# Patient Record
Sex: Male | Born: 1945 | Race: White | Hispanic: No | Marital: Married | Smoking: Never smoker
Health system: Southern US, Community
[De-identification: ages and names within clinical notes are randomized; demographics above are authoritative.]

## PROBLEM LIST (undated history)

## (undated) DIAGNOSIS — T8149XA Infection following a procedure, other surgical site, initial encounter: Secondary | ICD-10-CM

## (undated) DIAGNOSIS — I10 Essential (primary) hypertension: Secondary | ICD-10-CM

## (undated) DIAGNOSIS — A419 Sepsis, unspecified organism: Secondary | ICD-10-CM

## (undated) DIAGNOSIS — K56609 Unspecified intestinal obstruction, unspecified as to partial versus complete obstruction: Secondary | ICD-10-CM

## (undated) DIAGNOSIS — I712 Thoracic aortic aneurysm, without rupture: Secondary | ICD-10-CM

## (undated) DIAGNOSIS — H3530 Unspecified macular degeneration: Secondary | ICD-10-CM

## (undated) DIAGNOSIS — R251 Tremor, unspecified: Secondary | ICD-10-CM

## (undated) DIAGNOSIS — A310 Pulmonary mycobacterial infection: Secondary | ICD-10-CM

## (undated) DIAGNOSIS — E785 Hyperlipidemia, unspecified: Secondary | ICD-10-CM

## (undated) DIAGNOSIS — J189 Pneumonia, unspecified organism: Secondary | ICD-10-CM

## (undated) DIAGNOSIS — I2510 Atherosclerotic heart disease of native coronary artery without angina pectoris: Secondary | ICD-10-CM

## (undated) DIAGNOSIS — K219 Gastro-esophageal reflux disease without esophagitis: Secondary | ICD-10-CM

## (undated) DIAGNOSIS — G250 Essential tremor: Secondary | ICD-10-CM

## (undated) DIAGNOSIS — E8809 Other disorders of plasma-protein metabolism, not elsewhere classified: Secondary | ICD-10-CM

## (undated) DIAGNOSIS — D649 Anemia, unspecified: Secondary | ICD-10-CM

## (undated) DIAGNOSIS — C800 Disseminated malignant neoplasm, unspecified: Secondary | ICD-10-CM

## (undated) HISTORY — PX: TONSILLECTOMY: SUR1361

## (undated) HISTORY — DX: Essential tremor: G25.0

---

## 2012-06-22 DIAGNOSIS — J189 Pneumonia, unspecified organism: Secondary | ICD-10-CM

## 2012-06-22 HISTORY — DX: Pneumonia, unspecified organism: J18.9

## 2012-07-12 ENCOUNTER — Emergency Department (HOSPITAL_COMMUNITY): Payer: 59

## 2012-07-12 ENCOUNTER — Encounter (HOSPITAL_COMMUNITY): Payer: Self-pay | Admitting: Emergency Medicine

## 2012-07-12 ENCOUNTER — Inpatient Hospital Stay (HOSPITAL_COMMUNITY)
Admission: EM | Admit: 2012-07-12 | Discharge: 2012-07-15 | DRG: 195 | Disposition: A | Discharge status: Home / Self Care | Payer: 59 | Attending: Family Medicine | Admitting: Family Medicine

## 2012-07-12 DIAGNOSIS — G250 Essential tremor: Secondary | ICD-10-CM | POA: Diagnosis present

## 2012-07-12 DIAGNOSIS — Z23 Encounter for immunization: Secondary | ICD-10-CM

## 2012-07-12 DIAGNOSIS — R251 Tremor, unspecified: Secondary | ICD-10-CM | POA: Diagnosis present

## 2012-07-12 DIAGNOSIS — Z79899 Other long term (current) drug therapy: Secondary | ICD-10-CM

## 2012-07-12 DIAGNOSIS — H3530 Unspecified macular degeneration: Secondary | ICD-10-CM | POA: Diagnosis present

## 2012-07-12 DIAGNOSIS — Z8249 Family history of ischemic heart disease and other diseases of the circulatory system: Secondary | ICD-10-CM

## 2012-07-12 DIAGNOSIS — J189 Pneumonia, unspecified organism: Principal | ICD-10-CM | POA: Diagnosis present

## 2012-07-12 DIAGNOSIS — Z791 Long term (current) use of non-steroidal anti-inflammatories (NSAID): Secondary | ICD-10-CM

## 2012-07-12 HISTORY — DX: Unspecified macular degeneration: H35.30

## 2012-07-12 HISTORY — DX: Pneumonia, unspecified organism: J18.9

## 2012-07-12 HISTORY — DX: Tremor, unspecified: R25.1

## 2012-07-12 LAB — POCT I-STAT, CHEM 8
Chloride: 104 mEq/L (ref 96–112)
Glucose, Bld: 117 mg/dL — ABNORMAL HIGH (ref 70–99)
HCT: 37 % — ABNORMAL LOW (ref 39.0–52.0)
Hemoglobin: 12.6 g/dL — ABNORMAL LOW (ref 13.0–17.0)
Potassium: 3.9 mEq/L (ref 3.5–5.1)

## 2012-07-12 LAB — CBC WITH DIFFERENTIAL/PLATELET
Basophils Absolute: 0 10*3/uL (ref 0.0–0.1)
Basophils Relative: 0 % (ref 0–1)
Eosinophils Absolute: 0.3 10*3/uL (ref 0.0–0.7)
Hemoglobin: 12.4 g/dL — ABNORMAL LOW (ref 13.0–17.0)
MCH: 32 pg (ref 26.0–34.0)
MCHC: 34.8 g/dL (ref 30.0–36.0)
Neutro Abs: 7.3 10*3/uL (ref 1.7–7.7)
Neutrophils Relative %: 70 % (ref 43–77)
Platelets: 253 10*3/uL (ref 150–400)
RDW: 13 % (ref 11.5–15.5)

## 2012-07-12 LAB — POCT I-STAT TROPONIN I: Troponin i, poc: 0 ng/mL (ref 0.00–0.08)

## 2012-07-12 MED ORDER — VANCOMYCIN HCL IN DEXTROSE 1-5 GM/200ML-% IV SOLN
1000.0000 mg | Freq: Once | INTRAVENOUS | Status: AC
  Administered 2012-07-12: 1000 mg via INTRAVENOUS
  Filled 2012-07-12: qty 200

## 2012-07-12 MED ORDER — OCUVITE-LUTEIN PO CAPS
1.0000 | ORAL_CAPSULE | Freq: Every day | ORAL | Status: DC
  Administered 2012-07-13 – 2012-07-15 (×3): 1 via ORAL
  Filled 2012-07-12 (×3): qty 1

## 2012-07-12 MED ORDER — PNEUMOCOCCAL VAC POLYVALENT 25 MCG/0.5ML IJ INJ
0.5000 mL | INJECTION | INTRAMUSCULAR | Status: AC
  Administered 2012-07-13: 0.5 mL via INTRAMUSCULAR
  Filled 2012-07-12: qty 0.5

## 2012-07-12 MED ORDER — DEXTROSE 5 % IV SOLN
1.0000 g | Freq: Three times a day (TID) | INTRAVENOUS | Status: DC
  Administered 2012-07-12 – 2012-07-15 (×8): 1 g via INTRAVENOUS
  Filled 2012-07-12 (×10): qty 1

## 2012-07-12 MED ORDER — PIPERACILLIN-TAZOBACTAM 3.375 G IVPB 30 MIN
3.3750 g | Freq: Once | INTRAVENOUS | Status: AC
  Administered 2012-07-12: 3.375 g via INTRAVENOUS
  Filled 2012-07-12: qty 50

## 2012-07-12 MED ORDER — PRIMIDONE 250 MG PO TABS
250.0000 mg | ORAL_TABLET | Freq: Every day | ORAL | Status: DC
  Administered 2012-07-13 – 2012-07-15 (×3): 250 mg via ORAL
  Filled 2012-07-12 (×3): qty 1

## 2012-07-12 MED ORDER — LEVOFLOXACIN IN D5W 750 MG/150ML IV SOLN
750.0000 mg | INTRAVENOUS | Status: AC
  Administered 2012-07-12 – 2012-07-14 (×3): 750 mg via INTRAVENOUS
  Filled 2012-07-12 (×3): qty 150

## 2012-07-12 MED ORDER — IOHEXOL 350 MG/ML SOLN
100.0000 mL | Freq: Once | INTRAVENOUS | Status: AC | PRN
  Administered 2012-07-12: 100 mL via INTRAVENOUS

## 2012-07-12 MED ORDER — IBUPROFEN 800 MG PO TABS
800.0000 mg | ORAL_TABLET | Freq: Three times a day (TID) | ORAL | Status: DC
  Administered 2012-07-12 – 2012-07-13 (×5): 800 mg via ORAL
  Filled 2012-07-12 (×10): qty 1

## 2012-07-12 MED ORDER — VANCOMYCIN HCL IN DEXTROSE 1-5 GM/200ML-% IV SOLN
1000.0000 mg | Freq: Two times a day (BID) | INTRAVENOUS | Status: DC
  Administered 2012-07-13 – 2012-07-15 (×5): 1000 mg via INTRAVENOUS
  Filled 2012-07-12 (×6): qty 200

## 2012-07-12 MED ORDER — VITAMIN D3 25 MCG (1000 UNIT) PO TABS
1000.0000 [IU] | ORAL_TABLET | Freq: Every day | ORAL | Status: DC
  Administered 2012-07-13 – 2012-07-15 (×3): 1000 [IU] via ORAL
  Filled 2012-07-12 (×3): qty 1

## 2012-07-12 MED ORDER — PROPRANOLOL HCL ER 160 MG PO CP24
160.0000 mg | ORAL_CAPSULE | Freq: Every day | ORAL | Status: DC
  Administered 2012-07-13 – 2012-07-15 (×3): 160 mg via ORAL
  Filled 2012-07-12 (×3): qty 1

## 2012-07-12 NOTE — ED Notes (Signed)
Onset 4 days ago left side chest pain with radiating to left shoulder pain and dry cough.

## 2012-07-12 NOTE — Progress Notes (Signed)
ANTIBIOTIC CONSULT NOTE - INITIAL  Pharmacy Consult for Vancomycin, Cefepime, and Levaquin Indication: rule out pneumonia  No Known Allergies  Patient Measurements:   Height = 73 inches Weight = 77 kg  Vital Signs: Temp: 98 F (36.7 C) (03/21 1530) Temp src: Oral (03/21 1530) BP: 138/76 mmHg (03/21 1630) Pulse Rate: 65 (03/21 1630) Intake/Output from previous day:   Intake/Output from this shift:    Labs:  Recent Labs  07/12/12 1202 07/12/12 1231  WBC 10.4  --   HGB 12.4* 12.6*  PLT 253  --   CREATININE  --  1.00   CrCl ~ 75 ml/min    Microbiology: No results found for this or any previous visit (from the past 720 hour(s)).  Medical History: Past Medical History  Diagnosis Date  . Tremor     takes Primidone 250 once daily and inderall 160 daily for this-has had it since he was 21    Medications:  Prescriptions prior to admission  Medication Sig Dispense Refill  . cholecalciferol (VITAMIN D) 1000 UNITS tablet Take 1,000 Units by mouth daily.      Marland Kitchen ibuprofen (ADVIL,MOTRIN) 200 MG tablet Take 800 mg by mouth every 8 (eight) hours as needed for pain.      . multivitamin-lutein (OCUVITE-LUTEIN) CAPS Take 1 capsule by mouth daily.      . primidone (MYSOLINE) 250 MG tablet Take 250 mg by mouth daily.      . propranolol ER (INDERAL LA) 160 MG SR capsule Take 160 mg by mouth daily.       Assessment: 67 yo M presented to ED 07/12/2012 with flu-like symptoms 1 month ago but has continued to have persistent cough.  He obtained a CXR that suggested LLL PNA and completed a 10-day course of Avelox and improved.  Four days ago he developed pleuritic CP and the cough returned.  CT scan done suggestive of PNA and negative for PE.  Pt is now admitted for IV antibiotics for suspected HCAP.  Patient received Zosyn x 1 dose in the ED ~ 1600 and Vancomycin 1gm ~ 1639.  Goal of Therapy:  Vancomycin trough level 15-20 mcg/ml  Plan:  Vancomycin 1gm IV q12h - next dose due 3/22  at 0600 (x 8 days). Continue Cefepime 1gm IV q8h as ordered (x 8 days). Continue Levaquin 750mg  IV q24h as ordered (x 3 days). Follow renal function, culture results, and clinical progress. Vancomycin level as indicated.  [employer], Pharm.D., BCPS Clinical Pharmacist Pager [phone] 07/12/2012 5:37 PM

## 2012-07-12 NOTE — ED Notes (Signed)
Pt c/o left sided chest pain, radiates to left shoulder. Pain is worse with deep inspiration. Pt states he has also noted a dry cough. Pt denies n/v, sob, or any other concerning symptoms. Pt states pain is relieved with ibuprofen.

## 2012-07-12 NOTE — Progress Notes (Signed)
Wesley Dillon [id number] Admission Data: 07/12/2012 6:21 PM Attending Provider: Rhetta Mura, MD  WUJ:[id number] Roseanne Reno, MD Consults/ Treatment Team:    Wesley Dillon is a 67 y.o. male patient admitted from ED awake, alert  & orientated  X 3, Came into ED with left sided CP radiating to left shoulder, dry cough. , VSS - Blood pressure 127/74, pulse 63, temperature 98.9 F (37.2 C), temperature source Oral, resp. rate 20, height 6\' 1"  (1.854 m), weight 77 kg (169 lb 12.1 oz), SpO2 100.00%., no c/o shortness of breath, no c/o chest pain, no distress noted. No tele  IV site WDL:  antecubital right, condition patent and no redness with a transparent dsg that's clean dry and intact.  Allergies:  No Known Allergies   Past Medical History  Diagnosis Date  . Tremor     takes Primidone 250 once daily and inderall 160 daily for this-has had it since he was 21  . Pneumonia 06/2012  . Macular degeneration     History:  obtained from the patient.   Pt orientation to unit, room and routine. Information packet given to patient/family and safety video watched.  Admission INP armband ID verified with patient/family, and in place. SR up x 2, fall risk assessment complete with Patient and family verbalizing understanding of risks associated with falls. Pt verbalizes an understanding of how to use the call bell and to call for help before getting out of bed.  Skin, clean-dry- intact without evidence of bruising, or skin tears.   No evidence of skin break down noted on exam.     Will cont to monitor and assist as needed.  Cindra Eves, RN 07/12/2012 6:21 PM

## 2012-07-12 NOTE — ED Provider Notes (Signed)
History     CSN: [id number]  Arrival date & time 07/12/12  1143   First MD Initiated Contact with Patient 07/12/12 1208      Chief Complaint  Patient presents with  . Chest Pain  . Cough    (Consider location/radiation/quality/duration/timing/severity/associated sxs/prior treatment) HPI Comments: Wesley Dillon is a 67 y.o. Male who presents for evaluation of pleuritic chest pain present for 4 days.  The pain is worse with deep breathing. It does not change with body movements.  He was treated for pneumonia about 3 months ago and subsequently has had a normal chest x-ray. He has a mild persistent cough, nonproductive. He denies fever, chills, nausea, vomiting, weakness, or dizziness. He has not previously had chronic lung disease. There are no other known modifying factors.  Patient is a 67 y.o. male presenting with chest pain and cough. The history is provided by the patient and the spouse.  Chest Pain Associated symptoms: cough   Cough Associated symptoms: chest pain     Past Medical History  Diagnosis Date  . Tremor     History reviewed. No pertinent past surgical history.  No family history on file.  History  Substance Use Topics  . Smoking status: Never Smoker   . Smokeless tobacco: Not on file  . Alcohol Use: No      Review of Systems  Respiratory: Positive for cough.   Cardiovascular: Positive for chest pain.  All other systems reviewed and are negative.    Allergies  Review of patient's allergies indicates no known allergies.  Home Medications   Current Outpatient Rx  Name  Route  Sig  Dispense  Refill  . cholecalciferol (VITAMIN D) 1000 UNITS tablet   Oral   Take 1,000 Units by mouth daily.         Marland Kitchen ibuprofen (ADVIL,MOTRIN) 200 MG tablet   Oral   Take 800 mg by mouth every 8 (eight) hours as needed for pain.         . multivitamin-lutein (OCUVITE-LUTEIN) CAPS   Oral   Take 1 capsule by mouth daily.         . primidone (MYSOLINE) 250  MG tablet   Oral   Take 250 mg by mouth daily.         . propranolol ER (INDERAL LA) 160 MG SR capsule   Oral   Take 160 mg by mouth daily.           BP 111/61  Pulse 62  Temp(Src) 98 F (36.7 C) (Oral)  Resp 14  SpO2 98%  Physical Exam  Nursing note and vitals reviewed. Constitutional: He is oriented to person, place, and time. He appears well-developed and well-nourished.  HENT:  Head: Normocephalic and atraumatic.  Right Ear: External ear normal.  Left Ear: External ear normal.  Eyes: Conjunctivae and EOM are normal. Pupils are equal, round, and reactive to light.  Neck: Normal range of motion and phonation normal. Neck supple.  Cardiovascular: Normal rate, regular rhythm, normal heart sounds and intact distal pulses.   Pulmonary/Chest: Effort normal and breath sounds normal. He exhibits no bony tenderness.  Abdominal: Soft. Normal appearance. There is no tenderness.  Musculoskeletal: Normal range of motion.  Neurological: He is alert and oriented to person, place, and time. He has normal strength. No cranial nerve deficit or sensory deficit. He exhibits normal muscle tone. Coordination normal.  Skin: Skin is warm, dry and intact.  Psychiatric: He has a normal mood and affect. His behavior  is normal. Judgment and thought content normal.    ED Course  Procedures (including critical care time)   Reevaluation: 1600 hours- no further complaint. Repeat vital signs are normal  Emergency Department treatment: Zosyn and vancomycin, IV.  Case discussed with hospitalist, patient will be admitted   Labs Reviewed  CBC WITH DIFFERENTIAL - Abnormal; Notable for the following:    RBC 3.88 (*)    Hemoglobin 12.4 (*)    HCT 35.6 (*)    Monocytes Absolute 1.1 (*)    All other components within normal limits  POCT I-STAT, CHEM 8 - Abnormal; Notable for the following:    Glucose, Bld 117 (*)    Hemoglobin 12.6 (*)    HCT 37.0 (*)    All other components within normal  limits  POCT I-STAT TROPONIN I   Ct Angio Chest W/cm &/or Wo Cm  07/12/2012  *RADIOLOGY REPORT*  Clinical Data: Chest pain and cough.  CT ANGIOGRAPHY CHEST  Technique:  Multidetector CT imaging of the chest using the standard protocol during bolus administration of intravenous contrast. Multiplanar reconstructed images including MIPs were obtained and reviewed to evaluate the vascular anatomy.  Contrast: OMNIPAQUE IOHEXOL 350 MG/ML SOLN chest x-ray 07/01/2012.  Comparison: Chest x-ray 07/01/2012.  Findings:  Mediastinum: No filling defects within the pulmonary arterial tree to suggest underlying pulmonary embolism. Heart size is mildly enlarged. There is no significant pericardial fluid, thickening or pericardial calcification. There is atherosclerosis of the thoracic aorta, the great vessels of the mediastinum and the coronary arteries, including calcified atherosclerotic plaque in the left main, left anterior descending, left circumflex and right coronary arteries. Mild calcifications of the aortic valve.  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 Distal arch and descending thoracic aorta are normal in caliber. No pathologically enlarged mediastinal or hilar lymph nodes. Please note that accurate exclusion of hilar adenopathy is limited on noncontrast CT scans.  The esophagus is unremarkable in appearance.  Lungs/Pleura: In the posteromedial aspect of the left lower lobe there is a mass-like opacity measuring approximately 5.0 x 5.5 x 3.0 cm.  While this could certainly represent a large mass, this may alternatively simply reflect an area of round pneumonia.  There are areas of low attenuation within this region which may be areas of infection/liquefaction, or may alternatively be areas of necrosis within an underlying mass.  There is a small left pleural effusion adjacent to this area, and throughout the adjacent lung parenchyma areas of ground-glass attenuation  and some surrounding micronodularity.  Throughout the remainder of the lungs bilaterally there are other areas of patchy bronchial wall thickening, cylindrical and varicose bronchiectasis, and peribronchovascular micro and macronodularity.  Upper Abdomen: Incompletely visualized incompletely visualized lesion measuring at least 1.5 cm in the posterior aspect of the upper pole of the left kidney is likely to represent a small cyst.  Musculoskeletal: There are no aggressive appearing lytic or blastic lesions noted in the visualized portions of the skeleton.  IMPRESSION: 1.  No evidence pulmonary embolism.  2.  Mass-like opacity in the posteromedial aspect of the left lower lobe measuring 5.0 x 5.5 x 3.0 cm.  Given the other extensive bronchial wall thickening, bronchiectasis and peribronchovascular micro and macronodularity in the lungs bilaterally, this mass-like area is favored to be infectious, but underlying neoplasm is difficult to entirely exclude.  Close attention on the repeat chest radiographs after trial of antimicrobial therapy is recommended to ensure the resolution of this finding.  Should  this not resolve, further evaluation with biopsy may be warranted. 3. Atherosclerosis, including left mid three-vessel coronary artery disease. 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  Assessment for potential risk factor modification, dietary therapy or pharmacologic therapy may be warranted, if clinically indicated. 4.  Mild cardiomegaly.   Original Report Authenticated By: Trudie Reed, M.D.      1. Healthcare-associated pneumonia       MDM   Persistent pneumonia, failed outpatient treatment with high risk exposures due to job. Doubt sepsis.Doubt metabolic instability, serious bacterial infection or impending vascular collapse; the patient is stable for discharge.    Plan: Admit        Flint Melter, MD 07/12/12 [phone]

## 2012-07-12 NOTE — H&P (Signed)
[facility] History and Physical  Wesley Dillon ZOX:[id number] DOB: 03/15/1946 DOA: 07/12/2012  Referring physician: Effie Shy PCP: Gaye Alken, MD  Specialists: None  Chief Complaint: Lung Mass  HPI: Wesley Dillon is a 67 y.o. male with only essential tremor-he developed a flu-like illness about 1 montha go and he took a flu vaccine.  He states he has had a persistent cough.  FInally he conceded to have acollegue xray him at the [facility] and he was diagnosed with a LLL Pna and he was given avelox 400 mg for 10 days which he comptleted the course of and felt better He went to see his PCP on 3.17.14 and rpt CXr that showed nothig and he felt fair. He developed pleurisy again over the past 4 days and it got significant enough-the pain was in the L lower chest with raditiaion to the L shoudler and would be wors ewith adeep braeath or burping, no issue swith positional changes.  Didn't hurt to twist or moving. Cough has been dry without sputum His last Quanitferon was neg per his recollection   Review of Systems: The patient denies fever, + Subj chills.  NO N/v CP is intermittent and consrtant-it ranges to about a 7  Ibuprofen seemd to help.  NO n/v. No blurre dor double vision, no falls, no rectla bleeding, no sore thraot, , no palpitations, no dizzyness, no abd pain,no dark stool or tarry stool, no rash, +n sick contacts but no travel     Past Medical History  Diagnosis Date  . Tremor    History reviewed. No pertinent past surgical history. Social History:  reports that he has never smoked. He does not have any smokeless tobacco history on file. He reports that he does not drink alcohol or use illicit drugs.   No Known Allergies  Family History  Problem Relation Age of Onset  . CAD Father   . Alzheimer's disease Mother   . Diabetes Mother     Prior to Admission medications   Medication Sig Start Date End Date Taking? Authorizing Provider  cholecalciferol (VITAMIN D)  1000 UNITS tablet Take 1,000 Units by mouth daily.   Yes Historical Provider, MD  ibuprofen (ADVIL,MOTRIN) 200 MG tablet Take 800 mg by mouth every 8 (eight) hours as needed for pain.   Yes Historical Provider, MD  multivitamin-lutein (OCUVITE-LUTEIN) CAPS Take 1 capsule by mouth daily.   Yes Historical Provider, MD  primidone (MYSOLINE) 250 MG tablet Take 250 mg by mouth daily.   Yes Historical Provider, MD  propranolol ER (INDERAL LA) 160 MG SR capsule Take 160 mg by mouth daily.   Yes Historical Provider, MD   Physical Exam: Filed Vitals:   07/12/12 1151 07/12/12 1230 07/12/12 1315 07/12/12 1530  BP: 116/72 105/62 105/63 111/61  Pulse: 69 61 60 62  Temp: 97.7 F (36.5 C)   98 F (36.7 C)  TempSrc: Oral   Oral  Resp: 20 19 20 14   SpO2: 95% 96% 97% 98%     General:  Alert pleasant  Eyes: eomi, ncat  ENT: nad  Neck: soft supple  Cardiovascular: s1 s2 no m/r/g, no rub of JVD or bruit  Respiratory:  Clear, no tvr/f  Abdomen: soft  Skin: nad  Musculoskeletal: rom intact  Psychiatric:  euthymmic  Neurologic:  nad  Labs on Admission:  Basic Metabolic Panel:  Recent Labs Lab 07/12/12 1231  NA 137  K 3.9  CL 104  GLUCOSE 117*  BUN 18  CREATININE 1.00  Liver Function Tests: No results found for this basename: AST, ALT, ALKPHOS, BILITOT, PROT, ALBUMIN,  in the last 168 hours No results found for this basename: LIPASE, AMYLASE,  in the last 168 hours No results found for this basename: AMMONIA,  in the last 168 hours CBC:  Recent Labs Lab 07/12/12 1202 07/12/12 1231  WBC 10.4  --   NEUTROABS 7.3  --   HGB 12.4* 12.6*  HCT 35.6* 37.0*  MCV 91.8  --   PLT 253  --    Cardiac Enzymes: No results found for this basename: CKTOTAL, CKMB, CKMBINDEX, TROPONINI,  in the last 168 hours  BNP (last 3 results) No results found for this basename: PROBNP,  in the last 8760 hours CBG: No results found for this basename: GLUCAP,  in the last 168  hours  Radiological Exams on Admission: Ct Angio Chest W/cm &/or Wo Cm  07/12/2012  *RADIOLOGY REPORT*  Clinical Data: Chest pain and cough.  CT ANGIOGRAPHY CHEST  Technique:  Multidetector CT imaging of the chest using the standard protocol during bolus administration of intravenous contrast. Multiplanar reconstructed images including MIPs were obtained and reviewed to evaluate the vascular anatomy.  Contrast: OMNIPAQUE IOHEXOL 350 MG/ML SOLN chest x-ray 07/01/2012.  Comparison: Chest x-ray 07/01/2012.  Findings:  Mediastinum: No filling defects within the pulmonary arterial tree to suggest underlying pulmonary embolism. Heart size is mildly enlarged. There is no significant pericardial fluid, thickening or pericardial calcification. There is atherosclerosis of the thoracic aorta, the great vessels of the mediastinum and the coronary arteries, including calcified atherosclerotic plaque in the left main, left anterior descending, left circumflex and right coronary arteries. Mild calcifications of the aortic valve.  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 Distal arch and descending thoracic aorta are normal in caliber. No pathologically enlarged mediastinal or hilar lymph nodes. Please note that accurate exclusion of hilar adenopathy is limited on noncontrast CT scans.  The esophagus is unremarkable in appearance.  Lungs/Pleura: In the posteromedial aspect of the left lower lobe there is a mass-like opacity measuring approximately 5.0 x 5.5 x 3.0 cm.  While this could certainly represent a large mass, this may alternatively simply reflect an area of round pneumonia.  There are areas of low attenuation within this region which may be areas of infection/liquefaction, or may alternatively be areas of necrosis within an underlying mass.  There is a small left pleural effusion adjacent to this area, and throughout the adjacent lung parenchyma areas of ground-glass  attenuation and some surrounding micronodularity.  Throughout the remainder of the lungs bilaterally there are other areas of patchy bronchial wall thickening, cylindrical and varicose bronchiectasis, and peribronchovascular micro and macronodularity.  Upper Abdomen: Incompletely visualized incompletely visualized lesion measuring at least 1.5 cm in the posterior aspect of the upper pole of the left kidney is likely to represent a small cyst.  Musculoskeletal: There are no aggressive appearing lytic or blastic lesions noted in the visualized portions of the skeleton.  IMPRESSION: 1.  No evidence pulmonary embolism.  2.  Mass-like opacity in the posteromedial aspect of the left lower lobe measuring 5.0 x 5.5 x 3.0 cm.  Given the other extensive bronchial wall thickening, bronchiectasis and peribronchovascular micro and macronodularity in the lungs bilaterally, this mass-like area is favored to be infectious, but underlying neoplasm is difficult to entirely exclude.  Close attention on the repeat chest radiographs after trial of antimicrobial therapy is recommended to ensure the resolution of  this finding.  Should this not resolve, further evaluation with biopsy may be warranted. 3. Atherosclerosis, including left mid three-vessel coronary artery disease. 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  Assessment for potential risk factor modification, dietary therapy or pharmacologic therapy may be warranted, if clinically indicated. 4.  Mild cardiomegaly.   Original Report Authenticated By: Trudie Reed, M.D.     EKG: Independently reviewed. NSR, with rate 0.12some early roplarization changes likely 2/2 to thin habitus  Assessment/Plan Principal Problem:   HCAP (healthcare-associated pneumonia) Active Problems:   Tremor   1. HCAP-discussed case with Dr. 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  Will start IV abx Cefepime and Levaquin and reassess. 2. Tremor-continue primidone and inderal  Spoke with Dr Delford Field informally-states Dr. Maple Hudson will see him over weekend  Code Status: Full Family Communication: Spoke with wife at bedisde  Disposition Plan:  Inpatient 1-2 days   Time spent: 52  Mahala Mengh[facility] Pager [phone]  If 7PM-7AM, please contact night-coverage [email or web address] Password [facility] 07/12/2012, 4:06 PM

## 2012-07-13 ENCOUNTER — Encounter (HOSPITAL_COMMUNITY): Payer: Self-pay | Admitting: Internal Medicine

## 2012-07-13 LAB — LEGIONELLA ANTIGEN, URINE: Legionella Antigen, Urine: NEGATIVE

## 2012-07-13 LAB — CBC
MCH: 32.4 pg (ref 26.0–34.0)
MCHC: 34.3 g/dL (ref 30.0–36.0)
MCV: 94.5 fL (ref 78.0–100.0)
Platelets: 266 10*3/uL (ref 150–400)
RBC: 3.98 MIL/uL — ABNORMAL LOW (ref 4.22–5.81)
RDW: 12.9 % (ref 11.5–15.5)

## 2012-07-13 LAB — HIV ANTIBODY (ROUTINE TESTING W REFLEX): HIV: NONREACTIVE

## 2012-07-13 NOTE — Consult Note (Signed)
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 Pulmonary consulted for advice with management.  SIGNIFICANT EVENTS / STUDIES:  CT chest 07/12/12  LINES / TUBES: Peripheral IV  CULTURES:   ANTIBIOTICS: Avelox x 10 days Feb/ 2014 Levaquin 07/12/12> Vanc        07/12/12> Cefepime 07/12/12> Pip/ Tazo 07/12/12>   HISTORY OF PRESENT ILLNESS:  Well until flu-like illness (flu vax +) early Feb, 2014. Persistent dry cough x 2 weeks. Then had CXR at [facility] where he works-- LLL infiltrate. Rx'd Avelox x 10 days and thought he was completely well. Office f/u w/ PCP Dr Camillo Flaming early March "infiltrate gone". Felt fine w/ no cough x 3-4 days.  Starting 4 days PTA noted return of dry cough. New pleuritic pain L low lateral ribs, got bad enough he couldn't sleep. Ibuprofen helped. Presented for ED eval w/ CT now showing 5x5 consolidation c/w pneumonia L post base. Also has patchy infiltrates bilateral peripherally lower lungs w. Some nodularity.  He now feels better after quadruple abx started last night for HCAP coverage, reflecting his work exposure and relapse after Avelox. Ibuprofen controlling pleuritic pain from 7/10-> 2/10. Remote outpatient pneumonia 15-20 years ago, Rx'd Z pak. No other hx lung disease or significant health concern.  Had Pneumovax @ previous pneumonia. No overt sinus discomfort, fever, N/V or nodes but has chilled some with this illness.  Denies recognized GERD or aspiration. Chronic dry cough after large meals, not affected by trials of PPIs or antihistamines. So far little sputum. Blood cultures, Strep urine antigen and Legionella antigen neg. Quant TB gold Neg.  PAST MEDICAL  HISTORY :  Past Medical History  Diagnosis Date  . Tremor     takes Primidone 250 once daily and inderall 160 daily for this-has had it since he was 21  . Pneumonia 06/2012  . Macular degeneration    Past Surgical History  Procedure Laterality Date  . Tonsillectomy     Prior to Admission medications   Medication Sig Start Date End Date Taking? Authorizing Provider  cholecalciferol (VITAMIN D) 1000 UNITS tablet Take 1,000 Units by mouth daily.   Yes Historical Provider, MD  ibuprofen (ADVIL,MOTRIN) 200 MG tablet Take 800 mg by mouth every 8 (eight) hours as needed for pain.   Yes Historical Provider, MD  multivitamin-lutein (OCUVITE-LUTEIN) CAPS Take 1 capsule by mouth daily.   Yes Historical Provider, MD  primidone (MYSOLINE) 250 MG tablet Take 250 mg by mouth daily.   Yes Historical Provider, MD  propranolol ER (INDERAL LA) 160 MG SR capsule Take 160 mg by mouth daily.   Yes Historical Provider, MD   No Known Allergies  FAMILY HISTORY:  Family History  Problem Relation Age of Onset  . CAD Father   . Alzheimer's disease Mother   . Diabetes Mother    SOCIAL HISTORY:  reports that he has never smoked. He has never used smokeless tobacco. He reports that he does not drink alcohol or use illicit drugs.  REVIEW OF SYSTEMS:  See HPI Constitutional: Negative for fever, +chills, Noweight loss, +malaise/fatigue, No-diaphoresis.  HENT: Negative for hearing loss, ear pain, nosebleeds, congestion, sore throat, neck pain, tinnitus and ear discharge.   Eyes: Negative  for blurred vision, double vision, photophobia, pain, discharge and redness. + macular degeneration Respiratory: + dry cough, No-hemoptysis, sputum production, shortness of breath, wheezing and stridor.   Cardiovascular: Negative for anginal chest pain, palpitations, orthopnea, claudication, leg swelling and PND.  Gastrointestinal: Negative for heartburn, nausea, vomiting, abdominal pain, diarrhea, constipation, blood in stool and  melena.  Genitourinary: Negative for dysuria, urgency, frequency, hematuria and flank pain.  Musculoskeletal: Negative for myalgias, back pain, joint pain and falls.  Skin: Negative for itching and rash.  Neurological: Negative for dizziness, tingling,+ tremors, No- sensory change, speech change, focal weakness, seizures, loss of consciousness, weakness and headaches.  Endo/Heme/Allergies: Negative for environmental allergies and polydipsia. Does not bruise/bleed easily.  SUBJECTIVE: Overnight- persistent dry cough. Pleuritic pain much improved. Wife in room.  VITAL SIGNS: Temp:  [97.7 F (36.5 C)-98.9 F (37.2 C)] 98.9 F (37.2 C) (03/22 0711) Pulse Rate:  [60-72] 72 (03/22 0711) Resp:  [18-20] 18 (03/22 0711) BP: (105-138)/(57-76) 107/63 mmHg (03/22 0711) SpO2:  [95 %-100 %] 98 % (03/22 0711) Weight:  [169 lb 12.1 oz (77 kg)] 169 lb 12.1 oz (77 kg) (03/21 1745)  PHYSICAL EXAMINATION: General:  Alert, conversational, up in chair, room air Neuro:  No obvious lateralizing findings. Hesitant speech w/o visible tremor now HEENT:  Own teeth, mucosa clear, no stridor Cardiovascular: RRR, no M,G,R No JVD or peripheral edema. Good capillary fill Lungs: Unremarkable except dry cough. No rhonchi, wheeze, rub or dullness Abdomen: Nontender w/o HSM Musculoskeletal:  Normal strength, ambulatory Skin:  No rash   Recent Labs Lab 07/12/12 1231  NA 137  K 3.9  CL 104  BUN 18  CREATININE 1.00  GLUCOSE 117*    Recent Labs Lab 07/12/12 1202 07/12/12 1231 07/13/12 0600  HGB 12.4* 12.6* 12.9*  HCT 35.6* 37.0* 37.6*  WBC 10.4  --  10.7*  PLT 253  --  266   Ct Angio Chest W/cm &/or Wo Cm  07/12/2012  *RADIOLOGY REPORT*  Clinical Data: Chest pain and cough.  CT ANGIOGRAPHY CHEST  Technique:  Multidetector CT imaging of the chest using the standard protocol during bolus administration of intravenous contrast. Multiplanar reconstructed images including MIPs were obtained and reviewed to  evaluate the vascular anatomy.  Contrast: OMNIPAQUE IOHEXOL 350 MG/ML SOLN chest x-ray 07/01/2012.  Comparison: Chest x-ray 07/01/2012.  Findings:  Mediastinum: No filling defects within the pulmonary arterial tree to suggest underlying pulmonary embolism. Heart size is mildly enlarged. There is no significant pericardial fluid, thickening or pericardial calcification. There is atherosclerosis of the thoracic aorta, the great vessels of the mediastinum and the coronary arteries, including calcified atherosclerotic plaque in the left main, left anterior descending, left circumflex and right coronary arteries. Mild calcifications of the aortic valve.  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 Distal arch and descending thoracic aorta are normal in caliber. No pathologically enlarged mediastinal or hilar lymph nodes. Please note that accurate exclusion of hilar adenopathy is limited on noncontrast CT scans.  The esophagus is unremarkable in appearance.  Lungs/Pleura: In the posteromedial aspect of the left lower lobe there is a mass-like opacity measuring approximately 5.0 x 5.5 x 3.0 cm.  While this could certainly represent a large mass, this may alternatively simply reflect an area of round pneumonia.  There are areas of low attenuation within this region which may be areas of infection/liquefaction, or may alternatively be areas of necrosis within an underlying mass.  There is a small left pleural  effusion adjacent to this area, and throughout the adjacent lung parenchyma areas of ground-glass attenuation and some surrounding micronodularity.  Throughout the remainder of the lungs bilaterally there are other areas of patchy bronchial wall thickening, cylindrical and varicose bronchiectasis, and peribronchovascular micro and macronodularity.  Upper Abdomen: Incompletely visualized incompletely visualized lesion measuring at least 1.5 cm in the posterior aspect of the  upper pole of the left kidney is likely to represent a small cyst.  Musculoskeletal: There are no aggressive appearing lytic or blastic lesions noted in the visualized portions of the skeleton.  IMPRESSION: 1.  No evidence pulmonary embolism.  2.  Mass-like opacity in the posteromedial aspect of the left lower lobe measuring 5.0 x 5.5 x 3.0 cm.  Given the other extensive bronchial wall thickening, bronchiectasis and peribronchovascular micro and macronodularity in the lungs bilaterally, this mass-like area is favored to be infectious, but underlying neoplasm is difficult to entirely exclude.  Close attention on the repeat chest radiographs after trial of antimicrobial therapy is recommended to ensure the resolution of this finding.  Should this not resolve, further evaluation with biopsy may be warranted. 3. Atherosclerosis, including left mid three-vessel coronary artery disease. 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  Assessment for potential risk factor modification, dietary therapy or pharmacologic therapy may be warranted, if clinically indicated. 4.  Mild cardiomegaly.   Original Report Authenticated By: Trudie Reed, M.D.    Radiology imaging  Reviewed with Dr Lorenz Coaster and his wife. Medications reviewed  ASSESSMENT / PLAN:  Pneumonia- HCAP with relapse after Avelox. No immunocompromised risk identified. Multilobar, recognizing patchy peripheral infiltrates bilaterally. There is potential for the LLL consolidated area to form an abscess or empyema. I would be pleasantly surprised if we are able to culture out an organism from blood. There has been only muted response with some chilliness, but not fever or significant leukocytosis. Suggestion from cough after meals that he may reflux some, but w/o obvious reflux event.   Recommendation-  Continue broad antibiotics through weekend Downstairs 2V CXR in AM 3/24 He may be able to go home  early in week on broad abx- perhaps levaquin plus augmentin  CD Chas Axel, MD Pulmonary and Critical Care Medicine [facility] Cell [phone]  Pager: [phone] after 3:00 PM  07/13/2012, 8:41 AM

## 2012-07-13 NOTE — Progress Notes (Signed)
PROGRESS NOTE  Wesley Dillon ZOX:[id number] DOB: [birth year] DOA: 07/12/2012 PCP: Gaye Alken, MD  Brief narrative: 67 yr old male admitted 3.21 with Chest mass in a setting of recent Out-patient PNA   Consultants:  Pulmonary  Procedures: CT chest1. No evidence pulmonary embolism. 2. Mass-like opacity in the posteromedial aspect of the left lower  lobe measuring 5.0 x 5.5 x 3.0 cm. Given the other extensive bronchial wall thickening, bronchiectasis and peribronchovascular micro and macronodularity in the lungs bilaterally, this mass-like area is favored to be infectious, but underlying neoplasm is difficult to entirely exclude. Close attention on the repeat chest radiographs after trial of antimicrobial therapy is recommended to ensure the resolution of this finding. Should this not resolve,  further evaluation with biopsy may be warranted. 3. Atherosclerosis, including left mid three-vessel coronary artery  disease. 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 Assessment for potential risk factor modification, dietary therapy or pharmacologic therapy may be warranted, if clinically indicated. 4. Mild cardiomegaly.   Antibiotics:  Cefepime 3.21  Levaquin 3.21  Vancomycin 3.21   Subjective  Feels good. Intermittent cough and Pleuritic CP responding to Ibuprofen   Objective    Interim History: none  Telemetry: Non tele  Objective: Filed Vitals:   07/12/12 1745 07/12/12 2056 07/13/12 0711 07/13/12 1000  BP: 127/74 107/57 107/63 113/54  Pulse: 63 64 72 69  Temp: 98.9 F (37.2 C) 98.1 F (36.7 C) 98.9 F (37.2 C) 97.4 F (36.3 C)  TempSrc: Oral  Oral Oral  Resp: 20 19 18    Height: 6\' 1"  (1.854 m)     Weight: 77 kg (169 lb 12.1 oz)     SpO2: 100% 95% 98% 93%    Intake/Output Summary (Last 24 hours) at 07/13/12 1515 Last data filed at 07/13/12 1300  Gross per 24 hour  Intake    800 ml  Output       0 ml  Net    800 ml    Exam: General: Alert pleasant Eyes: eomi, ncat ENT: nad Neck: soft supple Cardiovascular: s1 s2 no m/r/g, no rub of JVD or bruit Respiratory: Clear, no tvr/f   Data Reviewed: Basic Metabolic Panel:  Recent Labs Lab 07/12/12 1231  NA 137  K 3.9  CL 104  GLUCOSE 117*  BUN 18  CREATININE 1.00   Liver Function Tests: No results found for this basename: AST, ALT, ALKPHOS, BILITOT, PROT, ALBUMIN,  in the last 168 hours No results found for this basename: LIPASE, AMYLASE,  in the last 168 hours No results found for this basename: AMMONIA,  in the last 168 hours CBC:  Recent Labs Lab 07/12/12 1202 07/12/12 1231 07/13/12 0600  WBC 10.4  --  10.7*  NEUTROABS 7.3  --   --   HGB 12.4* 12.6* 12.9*  HCT 35.6* 37.0* 37.6*  MCV 91.8  --  94.5  PLT 253  --  266   Cardiac Enzymes: No results found for this basename: CKTOTAL, CKMB, CKMBINDEX, TROPONINI,  in the last 168 hours BNP: No components found with this basename: POCBNP,  CBG: No results found for this basename: GLUCAP,  in the last 168 hours  No results found for this or any previous visit (from the past 240 hour(s)).   Studies:              All Imaging reviewed and is as per above notation   Scheduled Meds: . ceFEPime (MAXIPIME) IV  1  g Intravenous Q8H  . cholecalciferol  1,000 Units Oral Daily  . ibuprofen  800 mg Oral TID  . levofloxacin (LEVAQUIN) IV  750 mg Intravenous Q24H  . multivitamin-lutein  1 capsule Oral Daily  . primidone  250 mg Oral Daily  . propranolol ER  160 mg Oral Daily  . vancomycin  1,000 mg Intravenous Q12H   Continuous Infusions:    Assessment/Plan: 1. Likely HCAP-. Will continue IV abx Cefepime/Vancomycin/Levaquin-Will defer to pulmonologist further management.  Will probably switch to Levaquin and Augmentin PO 3.24 after CXR and re-assess 2. Tremor-continue primidone and inderal  Code Status: Full code Family Communication: Family in room Disposition Plan:  Med-surg   Pleas Koch, MD  [facility] Pager [phone] 07/13/2012, 3:15 PM    LOS: 1 day

## 2012-07-14 MED ORDER — IBUPROFEN 800 MG PO TABS
800.0000 mg | ORAL_TABLET | Freq: Four times a day (QID) | ORAL | Status: DC | PRN
  Administered 2012-07-14: 800 mg via ORAL
  Filled 2012-07-14: qty 1

## 2012-07-14 NOTE — Progress Notes (Signed)
PROGRESS NOTE  Wesley Dillon:[id number] DOB: [birth year] DOA: 07/12/2012 PCP: Gaye Alken, MD  Brief narrative: 67 yr old male admitted 3.21 with Chest mass in a setting of recent Out-patient PNA   Consultants:  Pulmonary  Procedures: CT chest1. No evidence pulmonary embolism. 2. Mass-like opacity in the posteromedial aspect of the left lower  lobe measuring 5.0 x 5.5 x 3.0 cm. Given the other extensive bronchial wall thickening, bronchiectasis and peribronchovascular micro and macronodularity in the lungs bilaterally, this mass-like area is favored to be infectious, but underlying neoplasm is difficult to entirely exclude. Close attention on the repeat chest radiographs after trial of antimicrobial therapy is recommended to ensure the resolution of this finding. Should this not resolve,  further evaluation with biopsy may be warranted. 3. Atherosclerosis, including left mid three-vessel coronary artery  disease. 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 Assessment for potential risk factor modification, dietary therapy or pharmacologic therapy may be warranted, if clinically indicated. 4. Mild cardiomegaly.   Antibiotics:  Cefepime 3.21  Levaquin 3.21  Vancomycin 3.21   Subjective  Feels good.ambulant.  Wife present in room. NO CP n/v   Objective    Interim History: none  Telemetry: Non tele  Objective: Filed Vitals:   07/13/12 1000 07/13/12 1518 07/13/12 2109 07/14/12 0537  BP: 113/54 93/54 100/62 116/71  Pulse: 69 65 70 65  Temp: 97.4 F (36.3 C) 97.5 F (36.4 C) 98.6 F (37 C) 98.3 F (36.8 C)  TempSrc: Oral Axillary Oral Oral  Resp:  18 16 16   Height:      Weight:      SpO2: 93% 95% 96% 95%    Intake/Output Summary (Last 24 hours) at 07/14/12 1218 Last data filed at 07/14/12 8119  Gross per 24 hour  Intake   2150 ml  Output      0 ml  Net   2150 ml    Exam: General: Alert  pleasant Eyes: eomi, ncat ENT: nad Neck: soft supple Cardiovascular: s1 s2 no m/r/g, no rub of JVD or bruit Respiratory: Clear, no tvr/f   Data Reviewed: Basic Metabolic Panel:  Recent Labs Lab 07/12/12 1231  NA 137  K 3.9  CL 104  GLUCOSE 117*  BUN 18  CREATININE 1.00   Liver Function Tests: No results found for this basename: AST, ALT, ALKPHOS, BILITOT, PROT, ALBUMIN,  in the last 168 hours No results found for this basename: LIPASE, AMYLASE,  in the last 168 hours No results found for this basename: AMMONIA,  in the last 168 hours CBC:  Recent Labs Lab 07/12/12 1202 07/12/12 1231 07/13/12 0600  WBC 10.4  --  10.7*  NEUTROABS 7.3  --   --   HGB 12.4* 12.6* 12.9*  HCT 35.6* 37.0* 37.6*  MCV 91.8  --  94.5  PLT 253  --  266   Cardiac Enzymes: No results found for this basename: CKTOTAL, CKMB, CKMBINDEX, TROPONINI,  in the last 168 hours BNP: No components found with this basename: POCBNP,  CBG: No results found for this basename: GLUCAP,  in the last 168 hours  No results found for this or any previous visit (from the past 240 hour(s)).   Studies:              All Imaging reviewed and is as per above notation   Scheduled Meds: . ceFEPime (MAXIPIME) IV  1 g Intravenous Q8H  . cholecalciferol  1,000 Units Oral  Daily  . levofloxacin (LEVAQUIN) IV  750 mg Intravenous Q24H  . multivitamin-lutein  1 capsule Oral Daily  . primidone  250 mg Oral Daily  . propranolol ER  160 mg Oral Daily  . vancomycin  1,000 mg Intravenous Q12H   Continuous Infusions:    Assessment/Plan: 1. Likely HCAP-. Will continue IV abx Cefepime/Vancomycin/Levaquin-  Will probably switch to Levaquin and Augmentin PO 3.24 after CXR and re-assess-liekly d/c home if seems clea ron CXR in am 2. Tremor-continue primidone and inderal  Code Status: Full code Family Communication: Family in room Disposition Plan: Med-surg   Pleas Koch, MD  [facility] Pager  [phone] 07/14/2012, 12:18 PM    LOS: 2 days

## 2012-07-14 NOTE — Progress Notes (Signed)
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 Pulmonary consulted for advice with management.   SIGNIFICANT EVENTS / STUDIES:  CT chest 07/12/12   LINES / TUBES:   CULTURES:   ANTIBIOTICS:   HISTORY OF PRESENT ILLNESS:    SUBJECTIVE/ Overnight: Feeling well. Still light cough. Almost no pleuritic pain on scheduled ibuprofen  VITAL SIGNS: Temp:  [97.4 F (36.3 C)-98.6 F (37 C)] 98.3 F (36.8 C) (03/23 0537) Pulse Rate:  [65-70] 65 (03/23 0537) Resp:  [16-18] 16 (03/23 0537) BP: (93-116)/(54-71) 116/71 mmHg (03/23 0537) SpO2:  [93 %-96 %] 95 % (03/23 0537)  PHYSICAL EXAMINATION: PHYSICAL EXAMINATION:  General: Alert, conversational, up in chair, room air, reading newspaper Neuro: No obvious lateralizing findings. Hesitant speech w/o visible tremor now  HEENT: Own teeth, mucosa clear, no stridor  Cardiovascular: RRR, no M,G,R No JVD or peripheral edema. Good capillary fill  Lungs: Unremarkable except dry cough. No rhonchi, wheeze, rub or dullness  Abdomen: Nontender w/o HSM  Musculoskeletal: Normal strength, ambulatory  Skin: No rash    Recent Labs Lab 07/12/12 1231  NA 137  K 3.9  CL 104  BUN 18  CREATININE 1.00  GLUCOSE 117*    Recent Labs Lab 07/12/12 1202 07/12/12 1231 07/13/12 0600  HGB 12.4* 12.6* 12.9*  HCT 35.6* 37.0* 37.6*  WBC 10.4  --  10.7*  PLT 253  --  266   Ct Angio Chest W/cm &/or Wo Cm  07/12/2012  *RADIOLOGY REPORT*  Clinical Data: Chest pain and cough.  CT ANGIOGRAPHY CHEST  Technique:  Multidetector CT imaging of the chest using the standard protocol during bolus administration of intravenous contrast. Multiplanar reconstructed images  including MIPs were obtained and reviewed to evaluate the vascular anatomy.  Contrast: OMNIPAQUE IOHEXOL 350 MG/ML SOLN chest x-ray 07/01/2012.  Comparison: Chest x-ray 07/01/2012.  Findings:  Mediastinum: No filling defects within the pulmonary arterial tree to suggest underlying pulmonary embolism. Heart size is mildly enlarged. There is no significant pericardial fluid, thickening or pericardial calcification. There is atherosclerosis of the thoracic aorta, the great vessels of the mediastinum and the coronary arteries, including calcified atherosclerotic plaque in the left main, left anterior descending, left circumflex and right coronary arteries. Mild calcifications of the aortic valve.  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 Distal arch and descending thoracic aorta are normal in caliber. No pathologically enlarged mediastinal or hilar lymph nodes. Please note that accurate exclusion of hilar adenopathy is limited on noncontrast CT scans.  The esophagus is unremarkable in appearance.  Lungs/Pleura: In the posteromedial aspect of the left lower lobe there is a mass-like opacity measuring approximately 5.0 x 5.5 x 3.0 cm.  While this could certainly represent a large mass, this may alternatively simply reflect an area of round pneumonia.  There are areas of low attenuation within this region which may be areas of infection/liquefaction, or may alternatively be areas of necrosis within an underlying mass.  There is a small left pleural effusion adjacent to this area, and throughout the adjacent lung parenchyma areas of ground-glass attenuation and some surrounding micronodularity.  Throughout the remainder of the lungs bilaterally there are other areas of patchy bronchial wall thickening, cylindrical  and varicose bronchiectasis, and peribronchovascular micro and macronodularity.  Upper Abdomen: Incompletely visualized incompletely visualized lesion measuring at  least 1.5 cm in the posterior aspect of the upper pole of the left kidney is likely to represent a small cyst.  Musculoskeletal: There are no aggressive appearing lytic or blastic lesions noted in the visualized portions of the skeleton.  IMPRESSION: 1.  No evidence pulmonary embolism.  2.  Mass-like opacity in the posteromedial aspect of the left lower lobe measuring 5.0 x 5.5 x 3.0 cm.  Given the other extensive bronchial wall thickening, bronchiectasis and peribronchovascular micro and macronodularity in the lungs bilaterally, this mass-like area is favored to be infectious, but underlying neoplasm is difficult to entirely exclude.  Close attention on the repeat chest radiographs after trial of antimicrobial therapy is recommended to ensure the resolution of this finding.  Should this not resolve, further evaluation with biopsy may be warranted. 3. Atherosclerosis, including left mid three-vessel coronary artery disease. 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  Assessment for potential risk factor modification, dietary therapy or pharmacologic therapy may be warranted, if clinically indicated. 4.  Mild cardiomegaly.   Original Report Authenticated By: Trudie Reed, M.D.     ASSESSMENT / PLAN: Pneumonia- HCAP with relapse after Avelox. No immunocompromised risk identified. Multilobar, recognizing patchy peripheral infiltrates bilaterally. There is potential for the LLL consolidated area to form an abscess or empyema. I would be pleasantly surprised if we are able to culture out an organism from blood. There has been only muted host response with some chilliness, but not fever or significant leukocytosis.  Suggestion from cough after meals that he may reflux some, but w/o obvious reflux/ aspiration event.  07/14/12- Doing very well. Respecting exent of multilobar infiltrates and LLL consolidation, it is appropriate to continue IV abx today as  planned. Should be able to go home tomorrow on levaquin/ augmentin to outpt f/u w/ his primary physician unless CXR not showing any improvement.  Terisa Starr, MD Pulmonary and Critical Care Medicine [facility] [phone]  After 3:00 PM Pager: [phone]  07/14/2012, 8:32 AM

## 2012-07-15 ENCOUNTER — Inpatient Hospital Stay (HOSPITAL_COMMUNITY): Payer: 59

## 2012-07-15 DIAGNOSIS — J189 Pneumonia, unspecified organism: Principal | ICD-10-CM

## 2012-07-15 LAB — BASIC METABOLIC PANEL
CO2: 27 mEq/L (ref 19–32)
Calcium: 9.2 mg/dL (ref 8.4–10.5)
Creatinine, Ser: 0.89 mg/dL (ref 0.50–1.35)
GFR calc non Af Amer: 88 mL/min — ABNORMAL LOW (ref >90)

## 2012-07-15 MED ORDER — AMOXICILLIN-POT CLAVULANATE 875-125 MG PO TABS: 1.0000 | ORAL_TABLET | Freq: Two times a day (BID) | ORAL | Status: DC

## 2012-07-15 MED ORDER — LEVOFLOXACIN 500 MG PO TABS: 500.0000 mg | ORAL_TABLET | Freq: Every day | ORAL | Status: DC

## 2012-07-15 NOTE — Care Management Note (Signed)
    Page 1 of 1   07/15/2012     10:46:46 AM   CARE MANAGEMENT NOTE 07/15/2012  Patient:  Wesley Dillon, Wesley Dillon   Account Number:  [id number]  Date Initiated:  07/15/2012  Documentation initiated by:  Letha Cape  Subjective/Objective Assessment:   dx pna  admit- lives with wife. pta indep.     Action/Plan:   Anticipated DC Date:  07/15/2012   Anticipated DC Plan:  HOME/SELF CARE      DC Planning Services  CM consult      Choice offered to / List presented to:             Status of service:  Completed, signed off Medicare Important Message given?   (If response is "NO", the following Medicare IM given date fields will be blank) Date Medicare IM given:   Date Additional Medicare IM given:    Discharge Disposition:  HOME/SELF CARE  Per UR Regulation:  Reviewed for med. necessity/level of care/duration of stay  If discussed at Long Length of Stay Meetings, dates discussed:    Comments:  07/15/12 10:45 Letha Cape RN, BSN [phone] patient lives with spouse, pta indep.  Patient for dc today, no needs anticipated.

## 2012-07-15 NOTE — Discharge Summary (Signed)
Physician Discharge Summary  Wesley Dillon WUJ:[id number] DOB: Feb 03, 1946 DOA: 07/12/2012  PCP: Gaye Alken, MD  Admit date: 07/12/2012 Discharge date: 07/15/2012  Time spent: 22 minutes  Recommendations for Outpatient Follow-up:  1. Follow up CT scan recommended by Pulmonology-Dr. Delton Coombes has arranged an appt as per below  2. 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 He states he has had a persistent cough.  Finally he conceded to have a collegue xray him at the [facility] and he was diagnosed with a LLL Pna and he was given avelox 400 mg for 10 days which he compteted the course and felt better  He went to see his PCP on 3.17.14 and rpt CXr that showed nothing and he felt fair.  He developed pleurisy again over the past 4 days and it got significant enough-the pain was in the L lower chest with raditiaion to the L shoudler and would be worse with adeep braeath or burping, no issue swith positional changes. Didn't hurt to twist or moving.  Cough has been dry without sputum  His last Quanitferon was neg per his recollection Ed work-up revealed a neg CXR but ST scan showed a 5x5x3 mass in LL lobe thought to represent CAP failure. He was admitted to the Hospital and started on IV HCAP coverage of Vanc/Levaquin/Cefepime and Pulmonology was consulted to give an opinion as to mass It was thought again to be a Pneumonic mass and He had a repeat CXr on day of d/c showing a slightopacity in posteromedial  lobe-given his clinical stability, resolution of pleuritic pain and good out-patient follow up, he was d/c home   Discharge Exam: Filed Vitals:   07/14/12 0537 07/14/12 1404 07/15/12 0526 07/15/12 1051  BP: 116/71 85/49 112/64 102/58  Pulse: 65 65 68 63  Temp: 98.3 F (36.8 C) 98.7 F (37.1 C) 98.2 F (36.8 C)   TempSrc: Oral Oral Oral   Resp: 16 16 16    Height:      Weight:      SpO2: 95% 93% 94%    Well no issues. No cP or SOb.  AMbulatory Wife in room  General: nad Cardiovascular: s1 s2 no m/r/g Respiratory: clear  Discharge Instructions  Discharge Orders   Future Appointments Provider Department Dept Phone   08/26/2012 1:30 PM Leslye Peer, MD [facility] [phone]   Future Orders Complete By Expires     Diet - low sodium heart healthy  As directed     Increase activity slowly  As directed         Medication List    STOP taking these medications       cholecalciferol 1000 UNITS tablet  Commonly known as:  VITAMIN D     ibuprofen 200 MG tablet  Commonly known as:  ADVIL,MOTRIN     multivitamin-lutein Caps      TAKE these medications       amoxicillin-clavulanate 875-125 MG per tablet  Commonly known as:  AUGMENTIN  Take 1 tablet by mouth 2 (two) times daily.     levofloxacin 500 MG tablet  Commonly known as:  LEVAQUIN  Take 1 tablet (500 mg total) by mouth daily.     primidone 250 MG tablet  Commonly known as:  MYSOLINE  Take 250 mg by mouth daily.     propranolol ER 160 MG SR capsule  Commonly known as:  INDERAL LA  Take 160 mg by mouth daily.           Follow-up Information   Follow up with Leslye Peer., MD On 08/26/2012. (1:30pm)    Contact information:   [address] [place] [address] [phone]        The results of significant diagnostics from this hospitalization (including imaging, microbiology, ancillary and laboratory) are listed below for reference.    Significant Diagnostic Studies: Dg Chest 2  View  07/15/2012  *RADIOLOGY REPORT*  Clinical Data: Pneumonia shortness of breath.  CHEST - 2 VIEW  Comparison: CT chest03/21/2014 and chest x-ray from 07/01/2012.  Findings: Two-view exam shows interval increase in opacity at the medial left lung base posteriorly.  Right lung remains clear. Hyperexpansion suggests emphysema. The cardiopericardial silhouette is within normal limits for size. Imaged bony structures of the thorax are intact.  IMPRESSION: Persistent and slightly progressive opacity in the posteromedial left lower lobe.   Original Report Authenticated By: Kennith Center, M.D.    Ct Angio Chest W/cm &/or Wo Cm  07/12/2012  *RADIOLOGY REPORT*  Clinical Data: Chest pain and cough.  CT ANGIOGRAPHY CHEST  Technique:  Multidetector CT imaging of the chest using the standard protocol during bolus administration of intravenous contrast. Multiplanar reconstructed images including MIPs were obtained and reviewed to evaluate the vascular anatomy.  Contrast: OMNIPAQUE IOHEXOL 350 MG/ML SOLN chest x-ray 07/01/2012.  Comparison: Chest x-ray 07/01/2012.  Findings:  Mediastinum: No filling defects within the pulmonary arterial tree to suggest underlying pulmonary embolism. Heart size is mildly enlarged. There is no significant pericardial fluid, thickening or pericardial calcification. There is atherosclerosis of the thoracic aorta, the great vessels of the mediastinum and the coronary arteries, including calcified atherosclerotic plaque in the left main, left anterior descending, left circumflex and right coronary arteries. Mild calcifications of the aortic valve.  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 Distal arch and descending thoracic aorta are normal in caliber. No pathologically enlarged mediastinal or hilar lymph nodes. Please note that accurate exclusion of hilar adenopathy is limited on noncontrast CT scans.  The esophagus is unremarkable in appearance.   Lungs/Pleura: In the posteromedial aspect of the left lower lobe there is a mass-like opacity measuring approximately 5.0 x 5.5 x 3.0 cm.  While this could certainly represent a large mass, this may alternatively simply reflect an area of round pneumonia.  There are areas of low attenuation within this region which may be areas of infection/liquefaction, or may alternatively be areas of necrosis within an underlying mass.  There is a small left pleural effusion adjacent to this area, and throughout the adjacent lung parenchyma areas of ground-glass attenuation and some surrounding micronodularity.  Throughout the remainder of the lungs bilaterally there are other areas of patchy bronchial wall thickening, cylindrical and varicose bronchiectasis, and peribronchovascular micro and macronodularity.  Upper Abdomen: Incompletely visualized incompletely visualized lesion measuring at least 1.5 cm in the posterior aspect of the upper pole of the left kidney is likely to represent a small cyst.  Musculoskeletal: There are  no aggressive appearing lytic or blastic lesions noted in the visualized portions of the skeleton.  IMPRESSION: 1.  No evidence pulmonary embolism.  2.  Mass-like opacity in the posteromedial aspect of the left lower lobe measuring 5.0 x 5.5 x 3.0 cm.  Given the other extensive bronchial wall thickening, bronchiectasis and peribronchovascular micro and macronodularity in the lungs bilaterally, this mass-like area is favored to be infectious, but underlying neoplasm is difficult to entirely exclude.  Close attention on the repeat chest radiographs after trial of antimicrobial therapy is recommended to ensure the resolution of this finding.  Should this not resolve, further evaluation with biopsy may be warranted. 3. Atherosclerosis, including left mid three-vessel coronary artery disease. 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  Assessment for  potential risk factor modification, dietary therapy or pharmacologic therapy may be warranted, if clinically indicated. 4.  Mild cardiomegaly.   Original Report Authenticated By: Trudie Reed, M.D.     Microbiology: No results found for this or any previous visit (from the past 240 hour(s)).   Labs: Basic Metabolic Panel:  Recent Labs Lab 07/12/12 1231 07/15/12 0732  NA 137 136  K 3.9 4.4  CL 104 99  CO2  --  27  GLUCOSE 117* 115*  BUN 18 10  CREATININE 1.00 0.89  CALCIUM  --  9.2   Liver Function Tests: No results found for this basename: AST, ALT, ALKPHOS, BILITOT, PROT, ALBUMIN,  in the last 168 hours No results found for this basename: LIPASE, AMYLASE,  in the last 168 hours No results found for this basename: AMMONIA,  in the last 168 hours CBC:  Recent Labs Lab 07/12/12 1202 07/12/12 1231 07/13/12 0600  WBC 10.4  --  10.7*  NEUTROABS 7.3  --   --   HGB 12.4* 12.6* 12.9*  HCT 35.6* 37.0* 37.6*  MCV 91.8  --  94.5  PLT 253  --  266   Cardiac Enzymes: No results found for this basename: CKTOTAL, CKMB, CKMBINDEX, TROPONINI,  in the last 168 hours BNP: BNP (last 3 results) No results found for this basename: PROBNP,  in the last 8760 hours CBG: No results found for this basename: GLUCAP,  in the last 168 hours     Signed:  Rhetta Mura  [facility] 07/15/2012, 11:11 AM

## 2012-07-15 NOTE — Progress Notes (Signed)
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 Pulmonary consulted for advice with management.  SIGNIFICANT EVENTS / STUDIES:  CT chest 07/12/12  SUBJECTIVE/ Overnight: Much improved. Pleuritic pain has resolved  VITAL SIGNS: Temp:  [98.2 F (36.8 C)-98.7 F (37.1 C)] 98.2 F (36.8 C) (03/24 0526) Pulse Rate:  [65-68] 68 (03/24 0526) Resp:  [16] 16 (03/24 0526) BP: (85-112)/(49-64) 112/64 mmHg (03/24 0526) SpO2:  [93 %-94 %] 94 % (03/24 0526)  PHYSICAL EXAMINATION:  General: Alert, conversational, up in chair, room air Neuro: No obvious lateralizing findings. Hesitant speech w/o visible tremor now  HEENT: Own teeth, mucosa clear, no stridor  Cardiovascular: RRR, no M,G,R No JVD or peripheral edema. Good capillary fill  Lungs: Unremarkable except dry cough. No rhonchi, wheeze, rub or dullness  Abdomen: Nontender w/o HSM  Musculoskeletal: Normal strength, ambulatory  Skin: No rash    Recent Labs Lab 07/12/12 1231 07/15/12 0732  NA 137 136  K 3.9 4.4  CL 104 99  CO2  --  27  BUN 18 10  CREATININE 1.00 0.89  GLUCOSE 117* 115*    Recent Labs Lab 07/12/12 1202 07/12/12 1231 07/13/12 0600  HGB 12.4* 12.6* 12.9*  HCT 35.6* 37.0* 37.6*  WBC 10.4  --  10.7*  PLT 253  --  266   Dg Chest 2 View  07/15/2012  *RADIOLOGY REPORT*  Clinical Data: Pneumonia shortness of breath.  CHEST - 2 VIEW  Comparison: CT chest03/21/2014 and chest x-ray from 07/01/2012.  Findings: Two-view exam shows interval increase in opacity at the medial left lung base posteriorly.  Right lung remains clear. Hyperexpansion suggests emphysema. The cardiopericardial silhouette is within normal limits for size.  Imaged bony structures of the thorax are intact.  IMPRESSION: Persistent and slightly progressive opacity in the posteromedial left lower lobe.   Original Report Authenticated By: Kennith Center, M.D.     ASSESSMENT / PLAN: Pneumonia- HCAP with relapse after Avelox was completed. No immunocompromised risk identified. Appearance of infiltrate on CT scan does raise question of early abscess formation. Less likely is a mass. Agree with an extended course abx and then re-imaging in ~ 5-6 weeks.   I will see him as outpt 08/26/12 at 1:30. He will need a CT scan chest w contrast prior to the visit. He will arrange through his PCP Dr Zachery Dauer. If this proves difficult then he will call me and I will order the scan at [facility]. If he has any clinical decline off abx then he will call me and we will image him sooner.   Levy Pupa, MD, PhD 07/15/2012, 10:08 AM [facility] [phone] or if no answer [phone]

## 2012-07-15 NOTE — Progress Notes (Signed)
Patient discharge teaching given, including activity, diet, follow-up appoints, and medications. Patient verbalized understanding of all discharge instructions. IV access was d/c'd. Vitals are stable. Skin is intact except as charted in most recent assessments. Pt escorted out by volunteer, to be driven home by wife.

## 2012-08-21 ENCOUNTER — Ambulatory Visit
Admission: RE | Admit: 2012-08-21 | Discharge: 2012-08-21 | Disposition: A | Discharge status: Home / Self Care | Payer: 59 | Source: Ambulatory Visit | Attending: Family Medicine | Admitting: Family Medicine

## 2012-08-21 ENCOUNTER — Other Ambulatory Visit: Payer: Self-pay | Admitting: Family Medicine

## 2012-08-21 DIAGNOSIS — R9389 Abnormal findings on diagnostic imaging of other specified body structures: Secondary | ICD-10-CM

## 2012-08-21 MED ORDER — IOHEXOL 300 MG/ML  SOLN
75.0000 mL | Freq: Once | INTRAMUSCULAR | Status: AC | PRN
  Administered 2012-08-21: 75 mL via INTRAVENOUS

## 2012-08-26 ENCOUNTER — Inpatient Hospital Stay: Payer: 59 | Admitting: Emergency Medicine

## 2012-09-06 ENCOUNTER — Ambulatory Visit (INDEPENDENT_AMBULATORY_CARE_PROVIDER_SITE_OTHER): Payer: 59 | Admitting: Cardiovascular Disease

## 2012-09-06 ENCOUNTER — Encounter: Payer: Self-pay | Admitting: Cardiovascular Disease

## 2012-09-06 VITALS — BP 130/80 | HR 54 | Ht 73.0 in | Wt 174.0 lb

## 2012-09-06 DIAGNOSIS — I712 Thoracic aortic aneurysm, without rupture: Secondary | ICD-10-CM

## 2012-09-06 DIAGNOSIS — I2510 Atherosclerotic heart disease of native coronary artery without angina pectoris: Secondary | ICD-10-CM

## 2012-09-06 MED ORDER — LOSARTAN POTASSIUM 25 MG PO TABS: 25.0000 mg | ORAL_TABLET | Freq: Every day | ORAL | Status: DC

## 2012-09-06 NOTE — Patient Instructions (Addendum)
Your physician has recommended you make the following change in your medication: START Losartan 25mg  take one by mouth daily  Your physician recommends that you return for lab work in: 2 WEEKS (BMP)  Your physician has requested that you have an exercise tolerance test with Dr Excell Seltzer. For further information please visit [email or web address] Please also follow instruction sheet, as given.  Your physician wants you to follow-up in: 1 YEAR with Dr Excell Seltzer.  You will receive a reminder letter in the mail two months in advance. If you don't receive a letter, please call our office to schedule the follow-up appointment.  Dr Excell Seltzer also recommends an MRA of the chest in 1 YEAR.

## 2012-09-11 ENCOUNTER — Encounter: Payer: Self-pay | Admitting: Cardiovascular Disease

## 2012-09-11 DIAGNOSIS — I712 Thoracic aortic aneurysm, without rupture: Secondary | ICD-10-CM | POA: Insufficient documentation

## 2012-09-11 DIAGNOSIS — I2510 Atherosclerotic heart disease of native coronary artery without angina pectoris: Secondary | ICD-10-CM | POA: Insufficient documentation

## 2012-09-11 NOTE — Progress Notes (Signed)
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 He was hospitalized in March 2014 with multilobar pneumonia. A CT scan of the chest was performed and there was incidental notation of three-vessel coronary calcification as well as mild aneurysmal dilatation of the ascending aorta. He presents today for further evaluation and management.  He feels well. He has no history of symptomatic coronary artery disease, myocardial infarction, stroke, peripheral arterial disease, or hypertension. He walks briskly for 1 hour 4 days per week. He has no symptoms with that level of exertion. He specifically denies chest pain or pressure, dyspnea, orthopnea, PND, palpitations, lightheadedness, or syncope.  His past medical history includes prediabetes, borderline hyperlipidemia, essential tremor, and macular degeneration. His family history is pertinent for a myocardial infarction in his father at age 25. There is no family history of aneurysm, aortic dissection, or sudden death.  Outpatient Encounter Prescriptions as of 09/06/2012  Medication Sig Dispense Refill  . aspirin 81 MG tablet Take 81 mg by mouth daily.      Marland Kitchen atorvastatin (LIPITOR) 10 MG tablet Take 10 mg by mouth daily.      . Cholecalciferol (VITAMIN D3) 2000 UNITS TABS Take by mouth daily.      . Multiple Vitamins-Minerals (PRESERVISION AREDS PO) Take by mouth 2 (two) times daily.      . primidone (MYSOLINE) 250 MG tablet Take 250 mg by mouth daily.      . propranolol ER (INDERAL LA) 160 MG SR capsule Take 160 mg by mouth daily.      Marland Kitchen losartan (COZAAR) 25 MG tablet Take 1 tablet (25 mg total) by mouth daily.  90 tablet  3  . [DISCONTINUED] amoxicillin-clavulanate (AUGMENTIN) 875-125 MG per tablet Take 1 tablet by mouth 2 (two) times daily.  22 tablet  0  . [DISCONTINUED] levofloxacin (LEVAQUIN) 500 MG tablet Take 1 tablet (500 mg total) by mouth daily.  11 tablet  0   No facility-administered  encounter medications on file as of 09/06/2012.    Review of patient's allergies indicates no known allergies.  Past Medical History  Diagnosis Date  . Tremor     takes Primidone 250 once daily and inderall 160 daily for this-has had it since he was 21  . Pneumonia 06/2012  . Macular degeneration     Past Surgical History  Procedure Laterality Date  . Tonsillectomy      History   Social History  . Marital Status: Married    Spouse Name: N/A    Number of Children: N/A  . Years of Education: N/A   Occupational History  . Not on file.   Social History Main Topics  . Smoking status: Never Smoker   . Smokeless tobacco: Never Used  . Alcohol Use: No     Comment: rare drinker  . Drug Use: No  . Sexually Active: Not on file   Other Topics Concern  . Not on file   Social History Narrative   Lives in [place]   Physician at [employer]   Pneumococcal vaccine pending          Family History  Problem Relation Age of Onset  . CAD Father 24    Died MI  . Alzheimer's disease Mother   . Diabetes Mother   . Seizures Son     Died status epilepticus    ROS:  General: no fevers/chills/night sweats Eyes: no blurry vision, diplopia, or amaurosis ENT: no sore throat or  hearing loss Resp: no cough, wheezing, or hemoptysis CV: no edema or palpitations GI: no abdominal pain, nausea, vomiting, diarrhea, or constipation GU: no dysuria, frequency, or hematuria Skin: no rash Neuro: no headache, numbness, tingling, or weakness of extremities Musculoskeletal: no joint pain or swelling Heme: no bleeding, DVT, or easy bruising Endo: no polydipsia or polyuria  BP 130/80  Pulse 54  Ht 6\' 1"  (1.854 m)  Wt 78.926 kg (174 lb)  BMI 22.96 kg/m2  SpO2 97%  PHYSICAL EXAM: Pt is alert and oriented, WD, WN, thin gentleman in no distress. HEENT: normal Neck: JVP normal. Carotid upstrokes normal without bruits. No thyromegaly. Lungs: equal expansion, clear bilaterally CV: Apex is discrete and  nondisplaced, RRR without murmur or gallop Abd: soft, NT, +BS, no bruit, no hepatosplenomegaly Back: no CVA tenderness Ext: no C/C/E        Femoral pulses 2+= without bruits        DP/PT pulses intact and = Skin: warm and dry without rash Neuro: CNII-XII intact             Strength intact = bilaterally  EKG:  Sinus rhythm 65 beats per minute, borderline LVH, otherwise within normal limits. This tracing was reviewed from 07/16/2012 the  CTA Chest 07/12/2012: Findings:  Mediastinum: No filling defects within the pulmonary arterial tree  to suggest underlying pulmonary embolism. Heart size is mildly  enlarged. There is no significant pericardial fluid, thickening or  pericardial calcification. There is atherosclerosis of the thoracic  aorta, the great vessels of the mediastinum and the coronary  arteries, including calcified atherosclerotic plaque in the left  main, left anterior descending, left circumflex and right coronary  arteries. Mild calcifications of the aortic valve. Mild aneurysmal  dilatation of the ascending thoracic aorta (4.6 cm in diameter).  Distal arch and descending thoracic aorta are normal in caliber. No  pathologically enlarged mediastinal or hilar lymph nodes. Please  note that accurate exclusion of hilar adenopathy is limited on  noncontrast CT scans. The esophagus is unremarkable in appearance.  Lungs/Pleura: In the posteromedial aspect of the left lower lobe  there is a mass-like opacity measuring approximately 5.0 x 5.5 x  3.0 cm. While this could certainly represent a large mass, this  may alternatively simply reflect an area of round pneumonia. There  are areas of low attenuation within this region which may be areas  of infection/liquefaction, or may alternatively be areas of  necrosis within an underlying mass. There is a small left pleural  effusion adjacent to this area, and throughout the adjacent lung  parenchyma areas of ground-glass attenuation and  some surrounding  micronodularity. Throughout the remainder of the lungs bilaterally  there are other areas of patchy bronchial wall thickening,  cylindrical and varicose bronchiectasis, and peribronchovascular  micro and macronodularity.  Upper Abdomen: Incompletely visualized incompletely visualized  lesion measuring at least 1.5 cm in the posterior aspect of the  upper pole of the left kidney is likely to represent a small cyst.  Musculoskeletal: There are no aggressive appearing lytic or blastic  lesions noted in the visualized portions of the skeleton.  IMPRESSION:  1. No evidence pulmonary embolism.  2. Mass-like opacity in the posteromedial aspect of the left lower  lobe measuring 5.0 x 5.5 x 3.0 cm. Given the other extensive  bronchial wall thickening, bronchiectasis and peribronchovascular  micro and macronodularity in the lungs bilaterally, this mass-like  area is favored to be infectious, but underlying neoplasm is  difficult to entirely  exclude. Close attention on the repeat chest  radiographs after trial of antimicrobial therapy is recommended to  ensure the resolution of this finding. Should this not resolve,  further evaluation with biopsy may be warranted.  3. Atherosclerosis, including left mid three-vessel coronary artery  disease. In addition, there is mild aneurysmal dilatation (4.6 cm  in diameter) of the ascending thoracic aorta. Assessment for  potential risk factor modification, dietary therapy or  pharmacologic therapy may be warranted, if clinically indicated.  4. Mild cardiomegaly.   ASSESSMENT AND PLAN: 1. Coronary artery disease, native vessel. This is an incidental diagnosis based on coronary calcification on CT scan. Reasonable to pursue an exercise treadmill study to make sure that he does not have high risk features. He doesn't seem to have any symptoms of myocardial ischemia with exercise. He is on aggressive risk reduction program with a statin drug,  aspirin, and a beta blocker which he takes for essential tremor.  2. Aneurysm of the ascending aorta. No clear etiology as the patient has no history of hypertension, connective tissue disease, or vasculitis. He has a thin body habitus but clearly does not have criteria for Marfan's syndrome. There is some data for improved outcomes with angiotensin receptor blockers in patients with ascending aortic aneurysm in the setting of connective tissue disease. I think it would be reasonable to try him on a low dose ARB. A prescription for losartan 25 mg was written.  3. Hyperlipidemia. He's followed by Dr. Zachery Dauer. Lipid panel last year showed cholesterol 217, HDL 65, and LDL 133. He was just started on Lipitor after his diagnosis of CAD last month.  Plan followup in one year. He will have a metabolic panel 2 weeks after starting losartan. An exercise treadmill study will be done. I recommended an MRA of the chest in one year to follow his ascending aorta aneurysm.  Wesley Dillon 09/11/2012 11:32 PM

## 2012-09-18 ENCOUNTER — Ambulatory Visit (INDEPENDENT_AMBULATORY_CARE_PROVIDER_SITE_OTHER): Payer: 59 | Admitting: Emergency Medicine

## 2012-09-18 ENCOUNTER — Ambulatory Visit: Payer: 59 | Admitting: Cardiovascular Disease

## 2012-09-18 ENCOUNTER — Encounter: Payer: Self-pay | Admitting: Emergency Medicine

## 2012-09-18 VITALS — BP 106/68 | HR 59 | Temp 97.4°F | Ht 72.0 in | Wt 172.0 lb

## 2012-09-18 DIAGNOSIS — J189 Pneumonia, unspecified organism: Secondary | ICD-10-CM

## 2012-09-18 DIAGNOSIS — R918 Other nonspecific abnormal finding of lung field: Secondary | ICD-10-CM

## 2012-09-18 NOTE — Assessment & Plan Note (Signed)
Clinically resolved with what appears to be LLL scar on Ct scan. Unable to absolutely rule out an underlying mass but very unlikely. Also noted are ares of bronchiectasis, micronodular disease an one macro-nodule on the R that was not present on original CT scan in 3/14 - repeat Ct scan 6 months both insure stability of LLL and also to better characterize the R basilar nodule and the ares or bronchiectasis.

## 2012-09-18 NOTE — Progress Notes (Signed)
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 Was admitted 3/21 - 3/24 for LLL Pneumonia, presumed HCAP. Cultures were negative. He was treated w abx and clinically improved. Outpt abx ended after 10 days. He feels back to baseline   Past Medical History  Diagnosis Date  . Tremor     takes Primidone 250 once daily and inderall 160 daily for this-has had it since he was 21  . Pneumonia 06/2012  . Macular degeneration      Family History  Problem Relation Age of Onset  . CAD Father 68    Died MI  . Alzheimer's disease Mother   . Diabetes Mother   . Seizures Son     Died status epilepticus     History   Social History  . Marital Status: Married    Spouse Name: N/A    Number of Children: N/A  . Years of Education: N/A   Occupational History  . Not on file.   Social History Main Topics  . Smoking status: Never Smoker   . Smokeless tobacco: Never Used  . Alcohol Use: No     Comment: rare drinker  . Drug Use: No  . Sexually Active: Not on file   Other Topics Concern  . 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 aspirin 81 MG tablet Take 81 mg by mouth daily.      Marland Kitchen atorvastatin (LIPITOR) 10 MG tablet Take 10 mg by mouth daily.      . Cholecalciferol (VITAMIN D3) 2000 UNITS TABS Take by mouth daily.      Marland Kitchen losartan (COZAAR) 25 MG tablet Take 1 tablet (25 mg total) by mouth daily.  90 tablet  3  . Multiple Vitamins-Minerals (PRESERVISION AREDS PO) Take by mouth 2 (two) times daily.      . primidone (MYSOLINE) 250 MG tablet Take 250 mg by mouth daily.      . propranolol ER (INDERAL LA) 160 MG SR capsule Take 160 mg by mouth daily.       No facility-administered medications prior to visit.   Filed Vitals:   09/18/12 1428  BP: 106/68  Pulse: 59  Temp: 97.4 F  (36.3 C)  TempSrc: Oral  Height: 6' (1.829 m)  Weight: 172 lb (78.019 kg)  SpO2: 97%   Gen: Pleasant, well-nourished, in no distress,  normal affect  ENT: No lesions,  mouth clear,  oropharynx clear, no postnasal drip  Neck: No JVD, no TMG, no carotid bruits  Lungs: No use of accessory muscles, no dullness to percussion, clear without rales or rhonchi  Cardiovascular: RRR, heart sounds normal, no murmur or gallops, no peripheral edema  Abdomen: soft and NT, no HSM,  BS normal  Musculoskeletal: No deformities, no cyanosis or clubbing  Neuro: alert, non focal  Skin: Warm, no lesions or rashes   Ct Chest 08/21/12 --  Comparison: Chest radiograph 07/15/2012, chest CT 07/12/2012  Findings: There are persistent areas of patchy tree in bud type  nodular airspace opacity bilaterally. Mild bronchiectasis noted  with areas of inspissated intrabronchial secretions. There is a  new irregular 1.1 cm right lower lobe pulmonary nodule image 46.  Within the lingula, there is also a new 0.8 cm  mildly irregular  nodular opacity image 45. There has been improvement in, but  incomplete resolution of, focal consolidation with probable  adjacent pleural scarring and air bronchogram formation in the left  lower lobe, for example image 55.  Mild ascending aortic ectasia is stable, 4.3 cm. Heart size is  normal. No pericardial or pleural effusion. No lymphadenopathy.  Leftward sternal tilt again noted. No acute osseous abnormality.  IMPRESSION:  Persistent evidence of small airways infection with diffuse patchy  tree in bud nodular airspace opacities and intrabronchial  secretions.  Improvement in, but incomplete resolution of, left lower lobe mass  like opacity which could represent resolving pneumonia, however  underlying neoplasm is not excluded.  New irregular pulmonary nodules in the lingula and right lower  lobe, respectively, favored to represent infectious/inflammatory  etiology.   Close CT chest follow-up in 3 months with continued treatment is  recommended.  HCAP (healthcare-associated pneumonia) Clinically resolved with what appears to be LLL scar on Ct scan. Unable to absolutely rule out an underlying mass but very unlikely. Also noted are ares of bronchiectasis, micronodular disease an one macro-nodule on the R that was not present on original CT scan in 3/14 - repeat Ct scan 6 months both insure stability of LLL and also to better characterize the R basilar nodule and the ares or bronchiectasis.

## 2012-09-18 NOTE — Patient Instructions (Addendum)
We will repeat your CT scan of the chest in 6 months.  Please follow with Dr Delton Coombes after the scan to discuss the findings. Call sooner if any new issues.

## 2012-09-19 ENCOUNTER — Other Ambulatory Visit (INDEPENDENT_AMBULATORY_CARE_PROVIDER_SITE_OTHER): Payer: 59

## 2012-09-19 DIAGNOSIS — I2510 Atherosclerotic heart disease of native coronary artery without angina pectoris: Secondary | ICD-10-CM

## 2012-09-19 LAB — BASIC METABOLIC PANEL
BUN: 18 mg/dL (ref 6–23)
CO2: 29 mEq/L (ref 19–32)
Chloride: 103 mEq/L (ref 96–112)
Creatinine, Ser: 1 mg/dL (ref 0.4–1.5)
Glucose, Bld: 80 mg/dL (ref 70–99)
Potassium: 4.4 mEq/L (ref 3.5–5.1)

## 2012-09-24 ENCOUNTER — Other Ambulatory Visit: Payer: Self-pay | Admitting: Neurology

## 2012-10-02 ENCOUNTER — Encounter: Payer: 59 | Admitting: Cardiovascular Disease

## 2012-10-15 ENCOUNTER — Other Ambulatory Visit: Payer: Self-pay | Admitting: Neurology

## 2012-10-17 ENCOUNTER — Encounter: Payer: Self-pay | Admitting: Neurology

## 2012-10-17 ENCOUNTER — Ambulatory Visit (INDEPENDENT_AMBULATORY_CARE_PROVIDER_SITE_OTHER): Payer: 59 | Admitting: Neurology

## 2012-10-17 VITALS — BP 106/70 | HR 58 | Temp 97.3°F | Ht 73.0 in | Wt 176.0 lb

## 2012-10-17 DIAGNOSIS — G250 Essential tremor: Secondary | ICD-10-CM | POA: Insufficient documentation

## 2012-10-17 DIAGNOSIS — G252 Other specified forms of tremor: Secondary | ICD-10-CM

## 2012-10-17 MED ORDER — PROPRANOLOL HCL ER 80 MG PO CP24: 80.0000 mg | ORAL_CAPSULE | Freq: Every day | ORAL | Status: DC

## 2012-10-17 MED ORDER — PRIMIDONE 250 MG PO TABS: 250.0000 mg | ORAL_TABLET | ORAL | Status: DC

## 2012-10-17 NOTE — Progress Notes (Signed)
[facility]  Provider:  Dr Vickey Huger Referring Provider: Marthe Patch* Primary Care Physician:  Gaye Alken, MD  Chief Complaint  Patient presents with  . Follow-up    tremor,rm 11    HPI:  Wesley Dillon is a 67 y.o. male here as a revisit for essential tremor ,  Routine for refill.  Dr. 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 The patient has been doing well on propranolol in an extended release form.  In the past and histaminergic upper try to influence the tremor but did not have a positive effect , continues on  Mysoline  Review of Systems: Out of a complete 14 system review, the patient complains of only the following symptoms, and all other reviewed systems are negative. Tremor   History   Social History  . Marital Status: Married    Spouse Name: N/A    Number of Children: 2  . Years of Education: Med. Sch.   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 Smoking status: Never Smoker   . Smokeless tobacco: Never Used  . Alcohol Use: No     Comment: rare drinker  . Drug Use: No  . Sexually Active: Not on file   Other Topics Concern  . 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 CAD Father 52    Died MI  . Atrial fibrillation Father   . Asthma Father   . Alzheimer's disease Mother   . Diabetes Mother   . Heart disease Mother   . Atrial fibrillation Mother   . Seizures Son     Died status epilepticus    Past Medical History  Diagnosis Date  . Tremor     takes Primidone 250 once daily and inderall 160 daily for this-has had it since he was 21  . Pneumonia 06/2012  . Macular degeneration   . Benign essential tremor     Past Surgical History  Procedure Laterality Date  .  Tonsillectomy      Current Outpatient Prescriptions  Medication Sig Dispense Refill  . aspirin 81 MG tablet Take 81 mg by mouth daily.      Marland Kitchen atorvastatin (LIPITOR) 10 MG tablet Take 10 mg by mouth daily.      . Cholecalciferol (VITAMIN D3) 2000 UNITS TABS Take by mouth daily.      Marland Kitchen losartan (COZAAR) 25 MG tablet Take 1 tablet (25 mg total) by mouth daily.  90 tablet  3  . Multiple Vitamins-Minerals (PRESERVISION AREDS PO) Take by mouth 2 (two) times daily.      . primidone (MYSOLINE) 250 MG tablet TAKE 1 TABLET BY MOUTH EVERY MORNING  90 tablet  0  . propranolol ER (INDERAL LA) 80 MG 24 hr capsule TAKE 1 CAPSULE BY MOUTH EVERY MORNING  90 capsule  0   No current facility-administered medications for this visit.    Allergies as of 10/17/2012  . (No Known Allergies)    Vitals: BP 106/70  Pulse 58  Temp(Src) 97.3 F (36.3 C) (Oral)  Ht 6\' 1"  (1.854 m)  Wt 176 lb (79.833 kg)  BMI 23.23 kg/m2 Last Weight:  Wt Readings from Last 1 Encounters:  10/17/12 176  lb (79.833 kg)   Last Height:   Ht Readings from Last 1 Encounters:  10/17/12 6\' 1"  (1.854 m)     Physical exam:  General: The patient is awake, alert and appears not in acute distress. The patient is well groomed. Head: Normocephalic, atraumatic. Neck is supple. Mallampati 3 , neck circumference: 14.5  Inches,  No retrognathia, no septal deviation.  Cardiovascular:  Regular rate and rhythm , without  murmurs or carotid bruit, and without distended neck veins. Respiratory: Lungs are clear to auscultation. Skin:  Without evidence of edema, or rash Trunk:  Neurologic exam : The patient is awake and alert, oriented to place and time.  Memory subjective  described as intact. There is a normal attention span & concentration ability. Speech is fluent without  dysarthria,  Mild dysphonia . Mood and affect are appropriate.  Cranial nerves: Pupils are equal and briskly reactive to light.  Extraocular movements  in vertical  and horizontal planes intact and without nystagmus. Visual fields by finger perimetry are intact. Hearing to finger rub intact.  Facial sensation intact to fine touch. Facial motor strength is symmetric and tongue and uvula move midline.  Motor exam:   Normal tone and normal muscle bulk and symmetric normal strength in all extremities.  Sensory:  Fine touch, pinprick and vibration were tested in all extremities. Proprioception  normal.  Gait and station: Patient walks without assistive device .  Deep tendon reflexes: in the  upper and lower extremities are symmetric and intact.   Assessment:   Essential tremor  Refills.

## 2012-10-17 NOTE — Patient Instructions (Signed)
Tremor  Tremor is a rhythmic, involuntary muscular contraction characterized by oscillations (to-and-fro movements) of a part of the body. The most common of all involuntary movements, tremor can affect various body parts such as the hands, head, facial structures, vocal cords, trunk, and legs; most tremors, however, occur in the hands. Tremor often accompanies neurological disorders associated with aging. Although the disorder is not life-threatening, it can be responsible for functional disability and social embarrassment.  TREATMENT   There are many types of tremor and several ways in which tremor is classified. The most common classification is by behavioral context or position. There are five categories of tremor within this classification: resting, postural, kinetic, task-specific, and psychogenic. Resting or static tremor occurs when the muscle is at rest, for example when the hands are lying on the lap. This type of tremor is often seen in patients with Parkinson's disease. Postural tremor occurs when a patient attempts to maintain posture, such as holding the hands outstretched. Postural tremors include physiological tremor, essential tremor, tremor with basal ganglia disease (also seen in patients with Parkinson's disease), cerebellar postural tremor, tremor with peripheral neuropathy, post-traumatic tremor, and alcoholic tremor. Kinetic or intention (action) tremor occurs during purposeful movement, for example during finger-to-nose testing. Task-specific tremor appears when performing goal-oriented tasks such as handwriting, speaking, or standing. This group consists of primary writing tremor, vocal tremor, and orthostatic tremor. Psychogenic tremor occurs in both older and younger patients. The key feature of this tremor is that it dramatically lessens or disappears when the patient is distracted.  PROGNOSIS  There are some treatment options available for tremor; the appropriate treatment depends on  accurate diagnosis of the cause. Some tremors respond to treatment of the underlying condition, for example in some cases of psychogenic tremor treating the patient's underlying mental problem may cause the tremor to disappear. Also, patients with tremor due to Parkinson's disease may be treated with Levodopa drug therapy. Symptomatic drug therapy is available for several other tremors as well. For those cases of tremor in which there is no effective drug treatment, physical measures such as teaching the patient to brace the affected limb during the tremor are sometimes useful. Surgical intervention such as thalamotomy or deep brain stimulation may be useful in certain cases.  Document Released: 03/31/2002 Document Revised: 07/03/2011 Document Reviewed: 04/10/2005  ExitCare® Patient Information ©2014 [employer].

## 2012-11-20 ENCOUNTER — Ambulatory Visit (INDEPENDENT_AMBULATORY_CARE_PROVIDER_SITE_OTHER): Payer: 59 | Admitting: Cardiovascular Disease

## 2012-11-20 ENCOUNTER — Encounter: Payer: Self-pay | Admitting: Cardiovascular Disease

## 2012-11-20 DIAGNOSIS — I2510 Atherosclerotic heart disease of native coronary artery without angina pectoris: Secondary | ICD-10-CM

## 2012-11-20 NOTE — Progress Notes (Signed)
Exercise Treadmill Test  Pre-Exercise Testing Evaluation Rhythm: normal sinus  Rate: 70     Test  Exercise Tolerance Test Ordering MD: Tonny Bollman, MD  Interpreting MD: Tonny Bollman, MD  Unique Test No: 1  Treadmill:  1  Indication for ETT: known ASHD  Contraindication to ETT: No   Stress Modality: exercise - treadmill  Cardiac Imaging Performed: non   Protocol: standard Bruce - maximal  Max BP:  217/102  Max MPHR (bpm):  155 85% MPR (bpm):  132  MPHR obtained (bpm):  164 % MPHR obtained:  106  Reached 85% MPHR (min:sec):  6:53 Total Exercise Time (min-sec):  10:00  Workload in METS:  11.7 Borg Scale: 15  Reason ETT Terminated:  desired heart rate attained    ST Segment Analysis At Rest: normal ST segments - no evidence of significant ST depression With Exercise: non-specific ST changes  Other Information Arrhythmia:  No Angina during ETT:  absent (0) Quality of ETT:  diagnostic  ETT Interpretation:  normal - no evidence of ischemia by ST analysis  Comments: Good exercise tolerance. Hypertensive response to exercise (held beta-blocker 48 hours prior). Subtle ST depression but upsloping at peak exercise, rapid ST resolution within 30 seconds recovery. No angina with exertion.   Recommendations: Continue current med management.

## 2012-12-24 ENCOUNTER — Telehealth: Payer: Self-pay | Admitting: Neurology

## 2012-12-24 DIAGNOSIS — G250 Essential tremor: Secondary | ICD-10-CM

## 2012-12-24 MED ORDER — PRIMIDONE 250 MG PO TABS: 250.0000 mg | ORAL_TABLET | Freq: Every day | ORAL | Status: DC

## 2012-12-24 NOTE — Telephone Encounter (Signed)
Rx resent.

## 2013-02-27 ENCOUNTER — Other Ambulatory Visit: Payer: Self-pay

## 2013-03-12 ENCOUNTER — Other Ambulatory Visit: Payer: 59

## 2013-03-13 ENCOUNTER — Other Ambulatory Visit: Payer: 59

## 2013-03-14 ENCOUNTER — Ambulatory Visit
Admission: RE | Admit: 2013-03-14 | Discharge: 2013-03-14 | Disposition: A | Discharge status: Home / Self Care | Payer: 59 | Source: Ambulatory Visit | Attending: Emergency Medicine | Admitting: Emergency Medicine

## 2013-03-14 DIAGNOSIS — R918 Other nonspecific abnormal finding of lung field: Secondary | ICD-10-CM

## 2013-04-01 ENCOUNTER — Other Ambulatory Visit: Payer: 59

## 2013-04-01 ENCOUNTER — Ambulatory Visit (INDEPENDENT_AMBULATORY_CARE_PROVIDER_SITE_OTHER): Payer: 59 | Admitting: Emergency Medicine

## 2013-04-01 ENCOUNTER — Encounter: Payer: Self-pay | Admitting: Emergency Medicine

## 2013-04-01 VITALS — BP 132/84 | HR 56 | Ht 73.0 in | Wt 177.6 lb

## 2013-04-01 DIAGNOSIS — A310 Pulmonary mycobacterial infection: Secondary | ICD-10-CM | POA: Insufficient documentation

## 2013-04-01 DIAGNOSIS — R918 Other nonspecific abnormal finding of lung field: Secondary | ICD-10-CM

## 2013-04-01 NOTE — Progress Notes (Signed)
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 Was admitted 3/21 - 3/24 for LLL Pneumonia, presumed HCAP. Cultures were negative. He was treated w abx and clinically improved. Outpt abx ended after 10 days. He feels back to baseline  04/01/13 -- follows up to review CT scan to assess LLL scar and areas of micronodularity. Interestingly the new scan shows resolution of the prior consoilidation and nodules, but evolution of new nodular changes RLL. He remains asymptomatic, no cough, no sputum. He had negative quant gold test a year ago.    Filed Vitals:   04/01/13 0858  BP: 132/84  Pulse: 56  Height: 6' 1" (1.854 m)  Weight: 177 lb 9.6 oz (80.559 kg)  SpO2: 99%   Gen: Pleasant, well-nourished, in no distress,  normal affect  ENT: No lesions,  mouth clear,  oropharynx clear, no postnasal drip  Neck: No JVD, no TMG, no carotid bruits  Lungs: No use of accessory muscles, no dullness to percussion, clear without rales or rhonchi  Cardiovascular: RRR, heart sounds normal, no murmur or gallops, no peripheral edema  Abdomen: soft and NT, no HSM,  BS normal  Musculoskeletal: No deformities, no cyanosis or clubbing  Neuro: alert, non focal  Skin: Warm, no lesions or rashes    CT 03/14/13 --  FINDINGS:  Bilateral tree in bud opacities are seen in a patchy diffuse  distribution. There is also mild bronchiectasis with intrabronchial  secretion inspissation. The 8 mm nodule in the lingula seen  previously has resolved. The posterior medial left lower lobe  consolidation has resolved. The irregular 10 mm pulmonary nodule in  the right lower lobe has resolved.  There are multiple new pulmonary nodules. In the right lower lobe  there is a 13 mm pulmonary nodule on image number 37, and this is  adjacent to a 12 mm pulmonary nodule on image number 36. Several  smaller subpleural nodules are identified in the right mid to lower  lung zone. On the left side, in  the lower lobe on image number 39,  there is a 7 mm nodule adjacent to a 5 mm nodule, with multiple  smaller peripheral tree in bud opacities. There is new consolidation  in the anterior inferior lingula which measures about 2.5 cm in  diameter.  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 Previously this  measured about 43 mm. It currently measures about 46 mm. There are  no pleural effusions. Scans the upper abdomen are negative. No acute  musculoskeletal findings.  IMPRESSION:  Areas of consolidation and prior nodules have resolved. However,  there are new areas of consolidation and multiple new nodules, seen  on a relatively stable background of bronchiectasis and tree in bud  opacifications, consistent with small airways infection which can be  seen with MAI. Consider followup CT in 3 months.  Slight increase in the degree of ascending aortic aneurysmal  dilatation to 47 mm  .  Pulmonary nodules Micronodular disease, waxing/waning by CT scan, suspect this is [facility].  - will need to follow serial CT's, next in 6 months - FOB on 04/29/12 at 7:30 at [facility] - quant gold testing today     

## 2013-04-01 NOTE — Patient Instructions (Signed)
Repeat CT scan in 08/2013 We will perform bronchoscopy 04/29/12 at 7:30am at [facility] Blood work today  Follow with Dr Delton Coombes in January 2015 after bronchoscopy

## 2013-04-01 NOTE — Assessment & Plan Note (Addendum)
Micronodular disease, waxing/waning by CT scan, suspect this is [facility].  - will need to follow serial CT's, next in 6 months - FOB on 04/29/12 at 7:30 at [facility] - quant gold testing today

## 2013-04-03 LAB — QUANTIFERON TB GOLD ASSAY (BLOOD)
Interferon Gamma Release Assay: NEGATIVE
Mitogen value: >10 IU/mL
Quantiferon Nil Value: 0.02 IU/mL
Quantiferon Tb Ag Minus Nil Value: 0 IU/mL
TB Ag value: 0.02 IU/mL

## 2013-04-29 ENCOUNTER — Ambulatory Visit (HOSPITAL_COMMUNITY): Payer: 59

## 2013-04-29 ENCOUNTER — Ambulatory Visit (HOSPITAL_COMMUNITY)
Admission: RE | Admit: 2013-04-29 | Discharge: 2013-04-29 | Disposition: A | Discharge status: Home / Self Care | Payer: 59 | Source: Ambulatory Visit | Attending: Emergency Medicine | Admitting: Emergency Medicine

## 2013-04-29 ENCOUNTER — Encounter (HOSPITAL_COMMUNITY)
Admission: RE | Disposition: A | Discharge status: Home / Self Care | Payer: 59 | Source: Ambulatory Visit | Attending: Emergency Medicine

## 2013-04-29 ENCOUNTER — Encounter (HOSPITAL_COMMUNITY): Payer: Self-pay | Admitting: Respiratory Therapy

## 2013-04-29 ENCOUNTER — Encounter (HOSPITAL_COMMUNITY): Payer: Self-pay

## 2013-04-29 DIAGNOSIS — R042 Hemoptysis: Secondary | ICD-10-CM | POA: Insufficient documentation

## 2013-04-29 DIAGNOSIS — R918 Other nonspecific abnormal finding of lung field: Secondary | ICD-10-CM | POA: Insufficient documentation

## 2013-04-29 DIAGNOSIS — A310 Pulmonary mycobacterial infection: Secondary | ICD-10-CM | POA: Diagnosis present

## 2013-04-29 DIAGNOSIS — J479 Bronchiectasis, uncomplicated: Secondary | ICD-10-CM | POA: Insufficient documentation

## 2013-04-29 HISTORY — DX: Atherosclerotic heart disease of native coronary artery without angina pectoris: I25.10

## 2013-04-29 HISTORY — PX: VIDEO BRONCHOSCOPY: SHX5072

## 2013-04-29 SURGERY — VIDEO BRONCHOSCOPY WITHOUT FLUORO
Anesthesia: Moderate Sedation | Laterality: Bilateral

## 2013-04-29 MED ORDER — PHENYLEPHRINE HCL 0.25 % NA SOLN
NASAL | Status: DC | PRN
  Administered 2013-04-29: 1 via NASAL

## 2013-04-29 MED ORDER — MIDAZOLAM HCL 10 MG/2ML IJ SOLN
INTRAMUSCULAR | Status: AC
  Filled 2013-04-29: qty 4

## 2013-04-29 MED ORDER — LIDOCAINE HCL 1 % IJ SOLN
INTRAMUSCULAR | Status: DC | PRN
  Administered 2013-04-29: 2 mg

## 2013-04-29 MED ORDER — PHENYLEPHRINE HCL 0.25 % NA SOLN: 1.0000 | Freq: Four times a day (QID) | NASAL | Status: DC | PRN

## 2013-04-29 MED ORDER — MIDAZOLAM HCL 10 MG/2ML IJ SOLN
INTRAMUSCULAR | Status: DC | PRN
  Administered 2013-04-29: 1 mg via INTRAVENOUS
  Administered 2013-04-29: 2 mg via INTRAVENOUS
  Administered 2013-04-29: 1 mg via INTRAVENOUS

## 2013-04-29 MED ORDER — FENTANYL CITRATE 0.05 MG/ML IJ SOLN
INTRAMUSCULAR | Status: AC
  Filled 2013-04-29: qty 4

## 2013-04-29 MED ORDER — LIDOCAINE HCL 2 % EX GEL
CUTANEOUS | Status: DC | PRN
  Administered 2013-04-29: 1

## 2013-04-29 MED ORDER — SODIUM CHLORIDE 0.9 % IV SOLN
INTRAVENOUS | Status: DC
  Administered 2013-04-29: 08:00:00 via INTRAVENOUS

## 2013-04-29 MED ORDER — FENTANYL CITRATE 0.05 MG/ML IJ SOLN
INTRAMUSCULAR | Status: DC | PRN
  Administered 2013-04-29: 50 ug via INTRAVENOUS
  Administered 2013-04-29 (×2): 25 ug via INTRAVENOUS

## 2013-04-29 MED ORDER — LIDOCAINE HCL 2 % EX GEL
Freq: Once | CUTANEOUS | Status: DC
  Filled 2013-04-29: qty 5

## 2013-04-29 NOTE — Discharge Instructions (Signed)
Flexible Bronchoscopy Care After These instructions give you information on caring for yourself after your procedure. Your doctor may also give you specific instructions. Call your doctor if you have any problems or questions after your procedure. HOME CARE  Do not eat or drink anything for 2 hours after your test.  After 2 hours have passed, eat soft food and drink liquids slowly.  The day after the test, go back to eating as normal.  Continue normal activities.  Keep all doctor visits if tissue samples (biopsies) were taken. Finding out the results of your test Ask when your test results will be ready. Make sure you get your test results. GET HELP RIGHT AWAY IF:  You get lightheaded.  You get short of breath.  You feel like you are going to pass out (faint).  You have chest pain.  You cough up blood. MAKE SURE YOU:  Understand these instructions.  Will watch your condition.  Will get help right away if you are not doing well or get worse. Document Released: 02/05/2009 Document Revised: 07/03/2011 Document Reviewed: 02/05/2009 [facility] Patient Information 2014 [place], [place]. DO NOT EAT OR DRINK UNTIL  1000 am today April 29, 2013  Please call our office for any problems or questions. [phone].

## 2013-04-29 NOTE — Op Note (Signed)
Video Bronchoscopy Procedure Note  Date of Operation: 04/29/2013  Pre-op Diagnosis: Bronchiectasis and Pulmonary Nodules  Post-op Diagnosis: Same  Surgeon: Baltazar Apo  Assistants: none  Anesthesia: conscious sedation, moderate sedation  Meds Given: fentanyl 148mcg, versed 4mg  in divided doses, 1% lidocaine 10cc total  Operation: Flexible video fiberoptic bronchoscopy and biopsies.  Estimated Blood Loss: [id number]  Complications: none noted  Indications and History: Wesley Dillon is 68 y.o. with history of Pneumonia diagnosed in 06/2012. Subsequent CT chest showed bronchiectasis and some L predominant micronodules. Repeat scanning 03/2013 showed resolution of some nodules but new changes mainly in the RLL Superior Segment.  Recommendation was to perform video fiberoptic bronchoscopy with BAL and biopsies. The risks, benefits, complications, treatment options and expected outcomes were discussed with the patient.  The possibilities of pneumothorax, pneumonia, reaction to medication, pulmonary aspiration, perforation of a viscus, bleeding, failure to diagnose a condition and creating a complication requiring transfusion or operation were discussed with the patient who freely signed the consent.    Description of Procedure: The patient was seen in the Preoperative Area, was examined and was deemed appropriate to proceed.  The patient was taken to [facility] , identified as Wesley Dillon and the procedure verified as Flexible Video Fiberoptic Bronchoscopy.  A Time Out was held and the above information confirmed.   Conscious sedation was initiated as indicated above. The video fiberoptic bronchoscope was introduced via the L nare and a general inspection was performed which showed prominent arytenoid cartilages especially on the R, normal cords, normal trachea, normal main carina. The R sided airways were inspected and showed a small area of hypopigmentation in the RUL bronchus. The  BI, RML and RLL were all widely patent and normal. The L side was then inspected. The LLL, Lingular and LUL airways were normal. There were no abnormal secretions. RUL endobronchial forceps biopsies were performed at the area of hypopigmentation for pathology. Under fluoroscopic guidance RLL Superior Segmental brushings were performed for AFB and fungal smears as well as for cytology. Repeat BAL's were performed in the superior segment (#1 and #2) for micro and cytology. Finally, under fluoroscopic guidance RLL Superior Segmental transbronchial biopsies were performed for pathology. There was some initial mild bleeding that stopped quickly. The patient tolerated the procedure well. The bronchoscope was removed. There were no obvious complications. A CXR is pending.   Samples: 1. Endobronchial brushings from RUL bronchus 2. Transbronchial Brushings from the RLL Superior Segment 3. Transbronchial Biopsies from the RLL Superior Segment 4. BAL x2 from the RLL Superior Segment  Plans:  We will review the cytology, pathology and microbiology results with the patient when they become available.  Outpatient followup will be with Dr [name].    Baltazar Apo, MD, PhD 04/29/2013, 8:17 AM [facility] [phone] or if no answer [phone]

## 2013-04-29 NOTE — Progress Notes (Signed)
Video Bronchoscopy with good patient tolerance Intervention with brushings x two areas Intervention with biopsies x two areas Intervention with washings x two areas

## 2013-04-29 NOTE — Interval H&P Note (Signed)
PCCM Interval Note  Pt presents today for FOB to evaluate bronchiectasis and waxing/waning micronodular disease. No significant clinical changes reported.   Filed Vitals:   04/29/13 0710 04/29/13 0713 04/29/13 0715 04/29/13 0720  BP: 135/79 129/74 129/74 130/74  Pulse: 55 55 54 54  Resp: 30 17 21    SpO2: 99% 100% 100% 100%   CT chest 04/01/13 --  COMPARISON: 08/21/2012  FINDINGS:  Bilateral tree in bud opacities are seen in a patchy diffuse  distribution. There is also mild bronchiectasis with intrabronchial  secretion inspissation. The 8 mm nodule in the lingula seen  previously has resolved. The posterior medial left lower lobe  consolidation has resolved. The irregular 10 mm pulmonary nodule in  the right lower lobe has resolved.  There are multiple new pulmonary nodules. In the right lower lobe  there is a 13 mm pulmonary nodule on image number 37, and this is  adjacent to a 12 mm pulmonary nodule on image number 36. Several  smaller subpleural nodules are identified in the right mid to lower  lung zone. On the left side, in the lower lobe on image number 39,  there is a 7 mm nodule adjacent to a 5 mm nodule, with multiple  smaller peripheral tree in bud opacities. There is new consolidation  in the anterior inferior lingula which measures about 2.5 cm in  diameter.  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 Previously this  measured about 43 mm. It currently measures about 46 mm. There are  no pleural effusions. Scans the upper abdomen are negative. No acute  musculoskeletal findings.  IMPRESSION:  Areas of consolidation and prior nodules have resolved. However,  there are new areas of consolidation and multiple new nodules, seen  on a relatively stable background of bronchiectasis and tree in bud  opacifications, consistent with small airways infection which can be  seen with MAI. Consider followup CT in 3 months.  Slight increase in the degree of ascending aortic  aneurysmal  dilatation to 47 mm.  Plans:  FOB with BAL and possible bx's   Baltazar Apo, MD, PhD 04/29/2013, 7:30 AM [facility] [phone] or if no answer [phone]

## 2013-04-29 NOTE — H&P (View-Only) (Signed)
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 Cultures were negative. He was treated w abx and clinically improved. Outpt abx ended after 10 days. He feels back to baseline  04/01/13 -- follows up to review CT scan to assess LLL scar and areas of micronodularity. Interestingly the new scan shows resolution of the prior consoilidation and nodules, but evolution of new nodular changes RLL. He remains asymptomatic, no cough, no sputum. He had negative quant gold test a year ago.    Filed Vitals:   04/01/13 0858  BP: 132/84  Pulse: 56  Height: 6\' 1"  (1.854 m)  Weight: 177 lb 9.6 oz (80.559 kg)  SpO2: 99%   Gen: Pleasant, well-nourished, in no distress,  normal affect  ENT: No lesions,  mouth clear,  oropharynx clear, no postnasal drip  Neck: No JVD, no TMG, no carotid bruits  Lungs: No use of accessory muscles, no dullness to percussion, clear without rales or rhonchi  Cardiovascular: RRR, heart sounds normal, no murmur or gallops, no peripheral edema  Abdomen: soft and NT, no HSM,  BS normal  Musculoskeletal: No deformities, no cyanosis or clubbing  Neuro: alert, non focal  Skin: Warm, no lesions or rashes    CT 03/14/13 --  FINDINGS:  Bilateral tree in bud opacities are seen in a patchy diffuse  distribution. There is also mild bronchiectasis with intrabronchial  secretion inspissation. The 8 mm nodule in the lingula seen  previously has resolved. The posterior medial left lower lobe  consolidation has resolved. The irregular 10 mm pulmonary nodule in  the right lower lobe has resolved.  There are multiple new pulmonary nodules. In the right lower lobe  there is a 13 mm pulmonary nodule on image number 37, and this is  adjacent to a 12 mm pulmonary nodule on image number 36. Several  smaller subpleural nodules are identified in the right mid to lower  lung zone. On the left side, in  the lower lobe on image number 39,  there is a 7 mm nodule adjacent to a 5 mm nodule, with multiple  smaller peripheral tree in bud opacities. There is new consolidation  in the anterior inferior lingula which measures about 2.5 cm in  diameter.  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 Previously this  measured about 43 mm. It currently measures about 46 mm. There are  no pleural effusions. Scans the upper abdomen are negative. No acute  musculoskeletal findings.  IMPRESSION:  Areas of consolidation and prior nodules have resolved. However,  there are new areas of consolidation and multiple new nodules, seen  on a relatively stable background of bronchiectasis and tree in bud  opacifications, consistent with small airways infection which can be  seen with MAI. Consider followup CT in 3 months.  Slight increase in the degree of ascending aortic aneurysmal  dilatation to 47 mm  .  Pulmonary nodules Micronodular disease, waxing/waning by CT scan, suspect this is [facility].  - will need to follow serial CT's, next in 6 months - FOB on 04/29/12 at 7:30 at [place] gold testing today

## 2013-04-30 ENCOUNTER — Encounter (HOSPITAL_COMMUNITY): Payer: Self-pay | Admitting: Emergency Medicine

## 2013-04-30 LAB — FUNGAL STAIN

## 2013-04-30 LAB — AFB STAIN
Special Requests: NORMAL
Special Requests: NORMAL

## 2013-04-30 NOTE — Progress Notes (Signed)
Baltazar Apo, MD, PhD 04/30/2013, 5:53 PM [facility] [phone] or if no answer [phone]

## 2013-05-01 LAB — CULTURE, BAL-QUANTITATIVE: Special Requests: NORMAL

## 2013-05-01 LAB — CULTURE, BAL-QUANTITATIVE W GRAM STAIN

## 2013-05-02 LAB — CULTURE, BAL-QUANTITATIVE

## 2013-05-02 LAB — CULTURE, BAL-QUANTITATIVE W GRAM STAIN: Special Requests: NORMAL

## 2013-05-16 ENCOUNTER — Encounter: Payer: Self-pay | Admitting: Emergency Medicine

## 2013-05-16 ENCOUNTER — Ambulatory Visit (INDEPENDENT_AMBULATORY_CARE_PROVIDER_SITE_OTHER): Payer: 59 | Admitting: Emergency Medicine

## 2013-05-16 VITALS — BP 100/58 | HR 56 | Ht 73.0 in | Wt 179.0 lb

## 2013-05-16 DIAGNOSIS — R918 Other nonspecific abnormal finding of lung field: Secondary | ICD-10-CM

## 2013-05-16 DIAGNOSIS — Z20818 Contact with and (suspected) exposure to other bacterial communicable diseases: Secondary | ICD-10-CM | POA: Insufficient documentation

## 2013-05-16 DIAGNOSIS — Z2089 Contact with and (suspected) exposure to other communicable diseases: Secondary | ICD-10-CM

## 2013-05-16 MED ORDER — AZITHROMYCIN 250 MG PO TABS: 250.0000 mg | ORAL_TABLET | Freq: Once | ORAL | Status: DC

## 2013-05-16 NOTE — Assessment & Plan Note (Signed)
Documented pertussis exposure 2 days ago.  - will treat with azithro for prophylaxis

## 2013-05-16 NOTE — Progress Notes (Signed)
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 Cultures were negative. He was treated w abx and clinically improved. Outpt abx ended after 10 days. He feels back to baseline  04/01/13 -- follows up to review CT scan to assess LLL scar and areas of micronodularity. Interestingly the new scan shows resolution of the prior consoilidation and nodules, but evolution of new nodular changes RLL. He remains asymptomatic, no cough, no sputum. He had negative quant gold test a year ago.   ROV 05/16/13 --  Follows up today for abnormal CT scan with LLL linear infiltrate and micronodular disease B. He has been feeling well. He has a stable mild cough, no SOB. No F/C or sick prodrome. He was exposed to pertussis at his clinic 2 days ago, no sx yet. Doing well post-FOB, no evidence complications.    Filed Vitals:   05/16/13 1331  BP: 100/58  Pulse: 56  Height: 6\' 1"  (1.854 m)  Weight: 179 lb (81.194 kg)  SpO2: 99%   Gen: Pleasant, well-nourished, in no distress,  normal affect  ENT: No lesions,  mouth clear,  oropharynx clear, no postnasal drip  Neck: No JVD, no TMG, no carotid bruits  Lungs: No use of accessory muscles, no dullness to percussion, clear without rales or rhonchi  Cardiovascular: RRR, heart sounds normal, no murmur or gallops, no peripheral edema  Abdomen: soft and NT, no HSM,  BS normal  Musculoskeletal: No deformities, no cyanosis or clubbing  Neuro: alert, non focal  Skin: Warm, no lesions or rashes    CT 03/14/13 --  FINDINGS:  Bilateral tree in bud opacities are seen in a patchy diffuse  distribution. There is also mild bronchiectasis with intrabronchial  secretion inspissation. The 8 mm nodule in the lingula seen  previously has resolved. The posterior medial left lower lobe  consolidation has resolved. The irregular 10 mm pulmonary nodule in  the right lower lobe has resolved.  There are  multiple new pulmonary nodules. In the right lower lobe  there is a 13 mm pulmonary nodule on image number 37, and this is  adjacent to a 12 mm pulmonary nodule on image number 36. Several  smaller subpleural nodules are identified in the right mid to lower  lung zone. On the left side, in the lower lobe on image number 39,  there is a 7 mm nodule adjacent to a 5 mm nodule, with multiple  smaller peripheral tree in bud opacities. There is new consolidation  in the anterior inferior lingula which measures about 2.5 cm in  diameter.  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 Previously this  measured about 43 mm. It currently measures about 46 mm. There are  no pleural effusions. Scans the upper abdomen are negative. No acute  musculoskeletal findings.  IMPRESSION:  Areas of consolidation and prior nodules have resolved. However,  there are new areas of consolidation and multiple new nodules, seen  on a relatively stable background of bronchiectasis and tree in bud  opacifications, consistent with small airways infection which can be  seen with MAI. Consider followup CT in 3 months.  Slight increase in the degree of ascending aortic aneurysmal  dilatation to 47 mm  .  Pertussis exposure Documented pertussis exposure 2 days ago.  - will treat with azithro for prophylaxis  Pulmonary nodules Biopsies and cytology negative, cx's are all pending. 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  -  will follow cx's to completion - repeat CT scan in May for interval change.  - rov in May

## 2013-05-16 NOTE — Patient Instructions (Signed)
We will treat with 5 days of azithromycin given your pertussis exposure.  We will repeat your CT scan of the chest in May 2015 Follow with Dr [name] in May after your CT scan or sooner if you have any problems.

## 2013-05-16 NOTE — Assessment & Plan Note (Signed)
Biopsies and cytology negative, cx's are all pending. 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  - will follow cx's to completion - repeat CT scan in May for interval change.  - rov in May

## 2013-05-21 ENCOUNTER — Telehealth: Payer: Self-pay | Admitting: Emergency Medicine

## 2013-05-21 NOTE — Telephone Encounter (Signed)
Pt returning lindsay's call can be reached at [phone].Wesley Dillon

## 2013-05-21 NOTE — Telephone Encounter (Signed)
Received a call from [employer]. 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 RB is aware of this information. A fax cover sheet will be faxed to [facility] asking them to run the sensitivities on this culture.  I have left a message for the pt to call back per RB's request.

## 2013-05-21 NOTE — Telephone Encounter (Signed)
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 He will await our call about the sensitivities.

## 2013-05-26 LAB — FUNGUS CULTURE W SMEAR
SPECIAL REQUESTS: NORMAL
Special Requests: NORMAL

## 2013-05-30 ENCOUNTER — Telehealth: Payer: Self-pay | Admitting: Emergency Medicine

## 2013-05-30 DIAGNOSIS — R918 Other nonspecific abnormal finding of lung field: Secondary | ICD-10-CM

## 2013-05-30 NOTE — Telephone Encounter (Signed)
Order signed by RB and faxed to [facility]

## 2013-06-02 ENCOUNTER — Other Ambulatory Visit: Payer: Self-pay | Admitting: Nurse Practitioner

## 2013-06-02 ENCOUNTER — Telehealth: Payer: Self-pay | Admitting: Cardiovascular Disease

## 2013-06-02 DIAGNOSIS — I712 Thoracic aortic aneurysm, without rupture, unspecified: Secondary | ICD-10-CM

## 2013-06-02 NOTE — Telephone Encounter (Signed)
Spoke with patient and advised him that MRA has been ordered and he will receive a call to schedule this before ov with Dr. Burt Knack 5/27.  Patient also aware that BMET needs to be done at least 2 days prior to MRA.  Patient currently has a lab appt for 5/15 but will adjust if needed.  Orders in epic.

## 2013-06-02 NOTE — Telephone Encounter (Signed)
New Message  Pt requests to have a MRA scheduled before his May appt with Dr. Burt Knack Please call

## 2013-06-09 LAB — AFB CULTURE WITH SMEAR (NOT AT ARMC): Special Requests: NORMAL

## 2013-06-23 ENCOUNTER — Other Ambulatory Visit (HOSPITAL_COMMUNITY): Payer: 59

## 2013-06-23 LAB — AFB CULTURE WITH SMEAR (NOT AT ARMC)
ACID FAST SMEAR: NONE SEEN
Special Requests: NORMAL

## 2013-06-25 ENCOUNTER — Ambulatory Visit (HOSPITAL_COMMUNITY)
Admission: RE | Admit: 2013-06-25 | Discharge: 2013-06-25 | Disposition: A | Discharge status: Home / Self Care | Payer: 59 | Source: Ambulatory Visit | Attending: Cardiovascular Disease | Admitting: Cardiovascular Disease

## 2013-06-25 DIAGNOSIS — I714 Abdominal aortic aneurysm, without rupture, unspecified: Secondary | ICD-10-CM | POA: Insufficient documentation

## 2013-06-25 DIAGNOSIS — I712 Thoracic aortic aneurysm, without rupture, unspecified: Secondary | ICD-10-CM

## 2013-06-25 LAB — CREATININE, SERUM
Creatinine, Ser: 0.97 mg/dL (ref 0.50–1.35)
GFR, EST NON AFRICAN AMERICAN: 84 mL/min — AB (ref >90)

## 2013-06-25 MED ORDER — GADOBENATE DIMEGLUMINE 529 MG/ML IV SOLN
20.0000 mL | Freq: Once | INTRAVENOUS | Status: AC | PRN
  Administered 2013-06-25: 20 mL via INTRAVENOUS

## 2013-06-28 ENCOUNTER — Encounter: Payer: Self-pay | Admitting: Emergency Medicine

## 2013-06-30 NOTE — Telephone Encounter (Signed)
RB have you received any sensitivities?  Thanks.

## 2013-06-30 NOTE — Telephone Encounter (Signed)
Yes - I saw these results come through at the end of last week. [facility] is sensitive to clarithro and ethambutol. Meghan could you find the print-out of the sensitivities and provide a copy for Dr [name]? Thanks, RB

## 2013-07-01 NOTE — Telephone Encounter (Signed)
Called pt to discuss results and advise of sensitivities and know if he would like to be treated. Clarithromycin, ethambutol, rifabutin are his sensitivities. Pt to be called and see if he would like treatment. Rb will advise of treatment and will contact pt back with this information tomorrow afternoon

## 2013-07-11 MED ORDER — AZITHROMYCIN 500 MG PO TABS: 500.0000 mg | ORAL_TABLET | ORAL | Status: DC

## 2013-07-11 MED ORDER — RIFABUTIN 150 MG PO CAPS: 300.0000 mg | ORAL_CAPSULE | ORAL | Status: DC

## 2013-07-11 MED ORDER — ETHAMBUTOL HCL 400 MG PO TABS: 25.0000 mg/kg | ORAL_TABLET | ORAL | Status: DC

## 2013-07-11 NOTE — Telephone Encounter (Signed)
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 He will take them 3x a week. He can alternate days (take them on separate days) if he would like to do so.   Azithromycin 500mg  TIW Rifabutin 300mg  TIW Ethambutol 2000mg  TIW  Thanks

## 2013-08-22 NOTE — Telephone Encounter (Signed)
Please close encounter, Thanks! SR

## 2013-08-26 ENCOUNTER — Other Ambulatory Visit: Payer: Self-pay | Admitting: Cardiovascular Disease

## 2013-09-05 ENCOUNTER — Other Ambulatory Visit: Payer: 59

## 2013-09-16 ENCOUNTER — Ambulatory Visit (INDEPENDENT_AMBULATORY_CARE_PROVIDER_SITE_OTHER)
Admission: RE | Admit: 2013-09-16 | Discharge: 2013-09-16 | Disposition: A | Discharge status: Home / Self Care | Payer: 59 | Source: Ambulatory Visit | Attending: Emergency Medicine | Admitting: Emergency Medicine

## 2013-09-16 ENCOUNTER — Ambulatory Visit (INDEPENDENT_AMBULATORY_CARE_PROVIDER_SITE_OTHER): Payer: 59 | Admitting: Emergency Medicine

## 2013-09-16 ENCOUNTER — Encounter: Payer: Self-pay | Admitting: Emergency Medicine

## 2013-09-16 VITALS — BP 118/64 | HR 64 | Ht 73.0 in | Wt 182.2 lb

## 2013-09-16 DIAGNOSIS — A310 Pulmonary mycobacterial infection: Secondary | ICD-10-CM

## 2013-09-16 DIAGNOSIS — A318 Other mycobacterial infections: Secondary | ICD-10-CM

## 2013-09-16 DIAGNOSIS — R918 Other nonspecific abnormal finding of lung field: Secondary | ICD-10-CM

## 2013-09-16 MED ORDER — AZITHROMYCIN 500 MG PO TABS: 500.0000 mg | ORAL_TABLET | ORAL | Status: DC

## 2013-09-16 MED ORDER — ETHAMBUTOL HCL 400 MG PO TABS: 25.0000 mg/kg | ORAL_TABLET | ORAL | Status: DC

## 2013-09-16 MED ORDER — RIFABUTIN 150 MG PO CAPS: 300.0000 mg | ORAL_CAPSULE | ORAL | Status: DC

## 2013-09-16 NOTE — Assessment & Plan Note (Signed)
We will repeat your CT scan in November 2015 We will get 3 month prescriptions for your antibiotics Follow with Dr [name] in November after the CT to review. We will decide at that time whether we can stop the antibiotic regimen.

## 2013-09-16 NOTE — Progress Notes (Signed)
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 Cultures were negative. He was treated w abx and clinically improved. Outpt abx ended after 10 days. He feels back to baseline  04/01/13 -- follows up to review CT scan to assess LLL scar and areas of micronodularity. Interestingly the new scan shows resolution of the prior consoilidation and nodules, but evolution of new nodular changes RLL. He remains asymptomatic, no cough, no sputum. He had negative quant gold test a year ago.   ROV 05/16/13 --  Follows up today for abnormal CT scan with LLL linear infiltrate and micronodular disease B. He has been feeling well. He has a stable mild cough, no SOB. No F/C or sick prodrome. He was exposed to pertussis at his clinic 2 days ago, no sx yet. Doing well post-FOB, no evidence complications.   ROV 09/16/13 -- follows for hx MAIC, on therapy since 3 months ago. He is doing well, underwent CT chest today (5/26). Cough is resolved. He is tolerating his abx regimen.    Filed Vitals:   09/16/13 1403  BP: 118/64  Pulse: 64  Height: 6\' 1"  (1.854 m)  Weight: 182 lb 3.2 oz (82.645 kg)  SpO2: 94%   Gen: Pleasant, well-nourished, in no distress,  normal affect  ENT: No lesions,  mouth clear,  oropharynx clear, no postnasal drip  Neck: No JVD, no TMG, no carotid bruits  Lungs: No use of accessory muscles, clear without rales or rhonchi  Cardiovascular: RRR, heart sounds normal, no murmur or gallops, no peripheral edema  Musculoskeletal: No deformities, no cyanosis or clubbing  Neuro: alert, non focal  Skin: Warm, no lesions or rashes   09/16/13 --  COMPARISON: Chest CTs ranging from 07/12/2012 through 03/14/2013.  FINDINGS:  Small mediastinal lymph nodes are stable, not pathologically  enlarged. The thyroid gland and thoracic inlet appear normal. There  is stable mild atherosclerosis of the aorta, great vessels and  coronary  arteries. The ascending aorta is dilated to 4.9 cm.  There is no pleural or pericardial effusion.  Again demonstrated is chronic lung disease with emphysema,  bronchiectasis and fluctuating peribronchial nodularity. Compared  with the most recent study, there is new patchy airspace disease  anteriorly in the right upper lobe (images 32-37). Peripheral right  upper lobe tree-in-bud nodularity has also progressed. Within the  right lower lobe, several of the previously demonstrated nodules  have decreased in size. There is 1 subpleural nodule which is  slightly larger, measuring 10 mm on image 40. The largest subpleural  nodule within the lingula previously has partially involuted.  The visualized upper abdomen has a stable appearance. There is no  evidence of adrenal mass. Low-density left renal lesion is stable.  There are no worrisome osseous findings.  IMPRESSION:  1. Fluctuating peribronchial nodularity with a tree-in-bud  distribution associated with underlying bronchiectasis. 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  2. No dominant mass or adenopathy to suggest neoplastic process.  3. Atherosclerosis with 4.9 cm ascending aortic aneurysm, slightly  larger than on prior studies. This has been evaluated by recent MRA.   MAC (mycobacterium avium-intracellulare complex) We will repeat your CT scan in November 2015 We will get 3 month prescriptions for your antibiotics Follow with Dr [name] in November after the CT to review. We will decide at that time whether we can stop the antibiotic regimen.

## 2013-09-16 NOTE — Addendum Note (Signed)
Addended by: Carlos American A on: 09/16/2013 04:25 PM   Modules accepted: Orders

## 2013-09-16 NOTE — Patient Instructions (Signed)
We will repeat your CT scan in November 2015 We will get 3 month prescriptions for your antibiotics Follow with Dr Byrum in November after the CT to review. We will decide at that time whether we can stop the antibiotic regimen. 

## 2013-09-17 ENCOUNTER — Ambulatory Visit (INDEPENDENT_AMBULATORY_CARE_PROVIDER_SITE_OTHER): Payer: 59 | Admitting: Cardiovascular Disease

## 2013-09-17 ENCOUNTER — Encounter: Payer: Self-pay | Admitting: Cardiovascular Disease

## 2013-09-17 VITALS — BP 104/65 | HR 58 | Ht 73.0 in | Wt 180.0 lb

## 2013-09-17 DIAGNOSIS — I2510 Atherosclerotic heart disease of native coronary artery without angina pectoris: Secondary | ICD-10-CM

## 2013-09-17 MED ORDER — LOSARTAN POTASSIUM 25 MG PO TABS: 25.0000 mg | ORAL_TABLET | Freq: Every day | ORAL | Status: DC

## 2013-09-17 NOTE — Progress Notes (Signed)
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 He is followed for subclinical coronary atherosclerosis and ascending aortic aneurysm. His most recent chest MRA demonstrated stability of this aneurysm with maximal diameter of the ascending aorta 4.7 cm.  He remains asymptomatic. He denies chest pain, chest pressure, dyspnea, edema, or palpitations. He's tolerating his medical therapy without difficulty.  Outpatient Encounter Prescriptions as of 09/17/2013  Medication Sig  . aspirin 81 MG tablet Take 81 mg by mouth daily.  Marland Kitchen atorvastatin (LIPITOR) 10 MG tablet Take 10 mg by mouth daily.  Marland Kitchen azithromycin (ZITHROMAX) 500 MG tablet Take 1 tablet (500 mg total) by mouth 3 (three) times a week.  . Cholecalciferol (VITAMIN D3) 2000 UNITS TABS Take by mouth daily.  Marland Kitchen ethambutol (MYAMBUTOL) 400 MG tablet Take 5 tablets (2,000 mg total) by mouth 3 (three) times a week.  . losartan (COZAAR) 25 MG tablet TAKE 1 TABLET BY MOUTH DAILY.  . Multiple Vitamins-Minerals (PRESERVISION AREDS PO) Take by mouth 2 (two) times daily.  . primidone (MYSOLINE) 250 MG tablet Take 1 tablet (250 mg total) by mouth daily.  . propranolol ER (INDERAL LA) 80 MG 24 hr capsule Take 1 capsule (80 mg total) by mouth daily.  . rifabutin (MYCOBUTIN) 150 MG capsule Take 2 capsules (300 mg total) by mouth 3 (three) times a week.    No Known Allergies  Past Medical History  Diagnosis Date  . Tremor     takes Primidone 250 once daily and inderall 160 daily for this-has had it since he was 21  . Pneumonia 06/2012  . Macular degeneration   . Benign essential tremor   . Coronary artery disease     ROS: Negative except as per HPI  BP 104/65  Pulse 58  Ht 6\' 1"  (1.854 m)  Wt 81.647 kg (180 lb)  BMI 23.75 kg/m2  PHYSICAL EXAM: Pt is alert and oriented, NAD HEENT: normal Neck: JVP - normal, carotids 2+= without bruits Lungs: CTA bilaterally CV: RRR without murmur or gallop Abd: soft, NT, Positive BS, no  hepatomegaly Ext: no C/C/E, distal pulses intact and equal Skin: warm/dry no rash  EKG:  Sinus rhythm 58 beats per minute, within normal limits.  ETT: 11/20/2012: Comments:  Good exercise tolerance. Hypertensive response to exercise (held beta-blocker 48 hours prior). Subtle ST depression but upsloping at peak exercise, rapid ST resolution within 30 seconds recovery. No angina with exertion.  Recommendations:  Continue current med management.   MRA chest 06/25/2013: FINDINGS:  Maximal diameters of the ascending aorta at the sinus of Valsalva,  sino-tubular junction, and ascending aorta are 4.5 cm, 4.0 cm, and  4.7 cm respectively. Based on my direct measurements on the prior  study, this is not significantly changed. There is no evidence of  aortic dissection or intramural hematoma. Origins of the great  vessels are patent. Descending aorta is non aneurysmal.  No obvious mediastinal mass effect.  Abnormal pulmonary parenchymal opacities in the posterior right  lower lobe, and to a lesser degree, the posterior left lower lobe  are incompletely evaluated on this study.  Benign appearing cysts in the left kidney is partially imaged.  IMPRESSION:  Stable aneurysmal dilatation of the ascending aorta with a maximal  diameter of 4.7 cm.  Bilateral lower lobe indeterminate pulmonary parenchymal opacities  are re- demonstrated but incompletely evaluated on this study.  Previously, on November 2014, a 3 month follow-up CT was recommended  to ensure resolution. This is reiterated an should be  performed to  adequately evaluate this abnormality.   ASSESSMENT AND PLAN: 1. Coronary artery disease, native vessel. The patient is stable without symptoms of angina. He had an exercise treadmill test last year demonstrating no significant abnormalities. He will continue on aspirin and atorvastatin. I will see him back in one year.  2. Aneurysm of the ascending aorta. This has been stable by serial  imaging. Will repeat an MRA next year. He will continue on losartan and propranolol.  3. Hyperlipidemia. Lipids are followed by Dr. Drema Dallas. He remains on atorvastatin. He will have labs faxed to [place] once they are done.  Sherren Mocha 09/17/2013 2:59 PM

## 2013-09-17 NOTE — Patient Instructions (Addendum)
Your physician has requested that you have a carotid duplex in 1 year. This test is an ultrasound of the carotid arteries in your neck. It looks at blood flow through these arteries that supply the brain with blood. Allow one hour for this exam. There are no restrictions or special instructions.  Your physician wants you to follow-up in: 1 year. You will receive a reminder letter in the mail two months in advance. If you don't receive a letter, please call our office to schedule the follow-up appointment.

## 2013-09-23 ENCOUNTER — Other Ambulatory Visit: Payer: Self-pay | Admitting: Neurology

## 2013-10-21 ENCOUNTER — Ambulatory Visit (INDEPENDENT_AMBULATORY_CARE_PROVIDER_SITE_OTHER): Payer: 59 | Admitting: Neurology

## 2013-10-21 ENCOUNTER — Encounter: Payer: Self-pay | Admitting: Neurology

## 2013-10-21 VITALS — BP 117/77 | HR 60 | Resp 16 | Ht 73.75 in | Wt 180.0 lb

## 2013-10-21 DIAGNOSIS — I712 Thoracic aortic aneurysm, without rupture, unspecified: Secondary | ICD-10-CM

## 2013-10-21 DIAGNOSIS — G252 Other specified forms of tremor: Secondary | ICD-10-CM

## 2013-10-21 DIAGNOSIS — J849 Interstitial pulmonary disease, unspecified: Secondary | ICD-10-CM | POA: Insufficient documentation

## 2013-10-21 DIAGNOSIS — I7121 Aneurysm of the ascending aorta, without rupture: Secondary | ICD-10-CM | POA: Insufficient documentation

## 2013-10-21 DIAGNOSIS — G250 Essential tremor: Secondary | ICD-10-CM

## 2013-10-21 DIAGNOSIS — Z5181 Encounter for therapeutic drug level monitoring: Secondary | ICD-10-CM

## 2013-10-21 DIAGNOSIS — J8409 Other alveolar and parieto-alveolar conditions: Secondary | ICD-10-CM

## 2013-10-21 MED ORDER — PRIMIDONE 250 MG PO TABS: 250.0000 mg | ORAL_TABLET | Freq: Every day | ORAL | Status: DC

## 2013-10-21 MED ORDER — PROPRANOLOL HCL ER 80 MG PO CP24: 80.0000 mg | ORAL_CAPSULE | Freq: Every day | ORAL | Status: DC

## 2013-10-21 NOTE — Patient Instructions (Signed)

## 2013-10-21 NOTE — Progress Notes (Signed)
[facility]  Provider:  Dr Brett Fairy Referring Provider: Randa Lynn* Primary Care Physician:  Gerrit Heck, MD  Chief Complaint  Patient presents with  . Follow-up    Room 11  . Tremors    HPI:  Wesley Dillon is a 68 y.o. male here as a revisit for essential tremor ,  Routine for refill.   Wesley Dillon medical history has new additions. He was diagnosed with repeated mycobacterial pneumonia, and a chest CT documented an aortic thoracic aneurysm.  He has related to the avium a new antibiotic to take daily.  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    Last visit note.  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 The patient has been doing well on propranolol in an extended release form. In the past and histaminergic upper try to influence the tremor but did not have a positive effect , continues on  Mysoline  Review of Systems: Out of a complete 14 system review, the patient complains of only the following symptoms, and all other reviewed systems are negative. Tremor   History   Social History  . Marital Status: Married    Spouse Name: Wesley Dillon    Number of Children: 2  . Years of Education: Med. Sch.   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 Smoking status: Never Smoker   . Smokeless tobacco: Never Used  . Alcohol Use: No     Comment: wine with dinner  . Drug Use: No  . Sexual Activity: Not on file   Other Topics Concern  . Not on file   Social History Narrative   Patient is married Wesley Dillon) and lives at home with his wife.   Patient has two adult children.   Patient has a college education.   Patient is right-handed.   Patient drinks four cups of coffee daily.   Lives in [place]   Physician at [facility]   Pneumococcal vaccine pending             Family History   Problem Relation Age of Onset  . CAD Father 76    Died MI  . Atrial fibrillation Father   . Asthma Father   . Alzheimer's disease Mother   . Diabetes Mother   . Heart disease Mother   . Atrial fibrillation Mother   . Seizures Son     Died status epilepticus    Past Medical History  Diagnosis Date  . Tremor     takes Primidone 250 once daily and inderall 160 daily for this-has had it since he was 21  . Pneumonia 06/2012  . Macular degeneration   . Benign essential tremor   . Coronary artery disease     Past Surgical History  Procedure Laterality Date  . Tonsillectomy    . Video bronchoscopy Bilateral 04/29/2013    Procedure: VIDEO BRONCHOSCOPY WITHOUT FLUORO;  Surgeon: Collene Gobble, MD;  Location: [facility];  Service: Cardiopulmonary;  Laterality: Bilateral;    Current Outpatient Prescriptions  Medication Sig Dispense Refill  . aspirin 81 MG tablet Take 81 mg by mouth daily.      Marland Kitchen atorvastatin (LIPITOR) 10 MG tablet Take 10 mg by mouth daily.      Marland Kitchen azithromycin (ZITHROMAX) 500 MG tablet Take 1 tablet (500 mg total) by mouth 3 (three) times  a week.  45 tablet  3  . Cholecalciferol (VITAMIN D3) 2000 UNITS TABS Take by mouth daily.      Marland Kitchen ethambutol (MYAMBUTOL) 400 MG tablet Take 5 tablets (2,000 mg total) by mouth 3 (three) times a week.  180 tablet  3  . losartan (COZAAR) 25 MG tablet Take 1 tablet (25 mg total) by mouth daily.  90 tablet  3  . Multiple Vitamins-Minerals (PRESERVISION AREDS PO) Take by mouth 2 (two) times daily.      . primidone (MYSOLINE) 250 MG tablet TAKE 1 TABLET BY MOUTH ONCE DAILY  90 tablet  0  . propranolol ER (INDERAL LA) 80 MG 24 hr capsule Take 1 capsule (80 mg total) by mouth daily.  90 capsule  3  . rifabutin (MYCOBUTIN) 150 MG capsule Take 2 capsules (300 mg total) by mouth 3 (three) times a week.  72 capsule  3   No current facility-administered medications for this visit.    Allergies as of 10/21/2013  . (No Known Allergies)     Vitals: BP 117/77  Pulse 60  Resp 16  Ht 6' 1.75" (1.873 m)  Wt 180 lb (81.647 kg)  BMI 23.27 kg/m2 Last Weight:  Wt Readings from Last 1 Encounters:  10/21/13 180 lb (81.647 kg)   Last Height:   Ht Readings from Last 1 Encounters:  10/21/13 6' 1.75" (1.873 m)     Physical exam:  General: The patient is awake, alert and appears not in acute distress. The patient is well groomed. Head: Normocephalic, atraumatic. Neck is supple. Mallampati 3 , neck circumference: 14.5  Inches,  No retrognathia, no septal deviation.  Cardiovascular: Regular rate and rhythm, without  murmurs or carotid bruit, and without distended neck veins. Respiratory: Lungs are clear to auscultation. Skin:  Without evidence of edema, or rash Trunk:  Neurologic exam : The patient is awake and alert, oriented to place and time.  Memory subjective  described as intact. There is a normal attention span & concentration ability. Speech is fluent without  dysarthria,  Mild dysphonia . Mood and affect are appropriate.  Cranial nerves: Pupils are equal and briskly reactive to light.  Extraocular movements  in vertical and horizontal planes intact and without nystagmus. Visual fields by finger perimetry are intact. Hearing to finger rub intact.  Facial sensation intact to fine touch. Facial motor strength is symmetric and tongue and uvula move midline.  Motor exam:   Normal tone and normal muscle bulk and symmetric normal strength in all extremities. There is mild cog wheeling over each biceps (  for years ) non progressive.   Sensory:  Fine touch, pinprick and vibration were tested in all extremities. Proprioception is  normal.  Gait and station: Patient walks without assistive device . No falls , no foot drop.   Deep tendon reflexes: in the  upper and lower extremities are symmetric and intact.   Assessment:   Essential tremor   Refills of primidone.

## 2013-10-31 ENCOUNTER — Encounter (HOSPITAL_COMMUNITY): Payer: Self-pay | Admitting: Emergency Medicine

## 2013-10-31 ENCOUNTER — Emergency Department (HOSPITAL_COMMUNITY)
Admission: EM | Admit: 2013-10-31 | Discharge: 2013-10-31 | Disposition: A | Discharge status: Home / Self Care | Payer: 59 | Source: Home / Self Care | Attending: Family Medicine | Admitting: Family Medicine

## 2013-10-31 ENCOUNTER — Emergency Department (INDEPENDENT_AMBULATORY_CARE_PROVIDER_SITE_OTHER): Payer: 59

## 2013-10-31 DIAGNOSIS — J209 Acute bronchitis, unspecified: Secondary | ICD-10-CM

## 2013-10-31 MED ORDER — MOXIFLOXACIN HCL 400 MG PO TABS: 400.0000 mg | ORAL_TABLET | Freq: Every day | ORAL | Status: DC

## 2013-10-31 NOTE — Discharge Instructions (Signed)
Take all of medicine as prescribed, drink plenty of fluids, see your doctor on mon as planned.

## 2013-10-31 NOTE — ED Provider Notes (Signed)
CSN: [id number]     Arrival date & time 10/31/13  1600 History   First MD Initiated Contact with Patient 10/31/13 1601     Chief Complaint  Patient presents with  . Cough   (Consider location/radiation/quality/duration/timing/severity/associated sxs/prior Treatment) Patient is a 68 y.o. male presenting with cough. The history is provided by the patient.  Cough Cough characteristics:  Productive Sputum characteristics:  Green and yellow Severity:  Moderate Onset quality:  Gradual Duration:  4 weeks Progression:  Worsening Chronicity:  New Smoker: no   Context comment:  On abx for mac, sx getting worse with fever. Associated symptoms comment:  Fever, rhinorrhea   Past Medical History  Diagnosis Date  . Tremor     takes Primidone 250 once daily and inderall 160 daily for this-has had it since he was 21  . Pneumonia 06/2012  . Macular degeneration   . Benign essential tremor   . Coronary artery disease    Past Surgical History  Procedure Laterality Date  . Tonsillectomy    . Video bronchoscopy Bilateral 04/29/2013    Procedure: VIDEO BRONCHOSCOPY WITHOUT FLUORO;  Surgeon: Collene Gobble, MD;  Location: [facility];  Service: Cardiopulmonary;  Laterality: Bilateral;   Family History  Problem Relation Age of Onset  . CAD Father 4    Died MI  . Atrial fibrillation Father   . Asthma Father   . Alzheimer's disease Mother   . Diabetes Mother   . Heart disease Mother   . Atrial fibrillation Mother   . Seizures Son     Died status epilepticus   History  Substance Use Topics  . Smoking status: Never Smoker   . Smokeless tobacco: Never Used  . Alcohol Use: No     Comment: wine with dinner    Review of Systems  Respiratory: Positive for cough.     Allergies  Review of patient's allergies indicates no known allergies.  Home Medications   Prior to Admission medications   Medication Sig Start Date End Date Taking? Authorizing Provider  aspirin 81 MG tablet Take 81 mg  by mouth daily.    Historical Provider, MD  atorvastatin (LIPITOR) 10 MG tablet Take 10 mg by mouth daily.    Historical Provider, MD  azithromycin (ZITHROMAX) 500 MG tablet Take 1 tablet (500 mg total) by mouth 3 (three) times a week. 09/16/13   Collene Gobble, MD  Cholecalciferol (VITAMIN D3) 2000 UNITS TABS Take by mouth daily.    Historical Provider, MD  ethambutol (MYAMBUTOL) 400 MG tablet Take 5 tablets (2,000 mg total) by mouth 3 (three) times a week. 09/16/13   Collene Gobble, MD  losartan (COZAAR) 25 MG tablet Take 1 tablet (25 mg total) by mouth daily. 09/17/13   Sherren Mocha, MD  moxifloxacin (AVELOX) 400 MG tablet Take 1 tablet (400 mg total) by mouth daily. 10/31/13   Billy Fischer, MD  Multiple Vitamins-Minerals (PRESERVISION AREDS PO) Take by mouth 2 (two) times daily.    Historical Provider, MD  primidone (MYSOLINE) 250 MG tablet Take 1 tablet (250 mg total) by mouth at bedtime. 10/21/13   Asencion Partridge Dohmeier, MD  propranolol ER (INDERAL LA) 80 MG 24 hr capsule Take 1 capsule (80 mg total) by mouth daily. 10/21/13   Asencion Partridge Dohmeier, MD  rifabutin (MYCOBUTIN) 150 MG capsule Take 2 capsules (300 mg total) by mouth 3 (three) times a week. 09/16/13   Collene Gobble, MD   BP 130/84  Pulse 74  Temp(Src) 98.6  F (37 C)  Resp 20  SpO2 94% Physical Exam  Nursing note and vitals reviewed. Constitutional: He is oriented to person, place, and time. He appears well-developed and well-nourished.  HENT:  Head: Normocephalic.  Right Ear: External ear normal.  Left Ear: External ear normal.  Mouth/Throat: Oropharynx is clear and moist.  Neck: Normal range of motion. Neck supple.  Cardiovascular: Normal rate and normal heart sounds.   Pulmonary/Chest: Effort normal. He has decreased breath sounds. He has rhonchi.  Lymphadenopathy:    He has no cervical adenopathy.  Neurological: He is alert and oriented to person, place, and time.  Skin: Skin is warm and dry.    ED Course  Procedures  (including critical care time) Labs Review Labs Reviewed - No data to display  Imaging Review Dg Chest 2 View  10/31/2013   CLINICAL DATA:  Productive cough, history of pneumonia  EXAM: CHEST  2 VIEW  COMPARISON:  09/16/2013, 04/24/2013  FINDINGS: Lungs are hyperinflated. Normal heart size and vascularity. No definite focal pneumonia, collapse or consolidation. No edema, effusion or pneumothorax. Trachea midline. On the lateral view, there is chronic scarring/atelectasis in inferior right middle lobe.  IMPRESSION: Hyperinflation and chronic right middle lobe scarring/atelectasis.  No definite new superimposed acute process.   Electronically Signed   By: Daryll Brod M.D.   On: 10/31/2013 16:23     MDM   1. Acute bronchitis, unspecified organism        Billy Fischer, MD 11/02/13 1328

## 2013-10-31 NOTE — ED Notes (Signed)
Cough     Productive           denys  Any  Pain        Yellow   Green      Sputum       Cough  X  1  Month  Fever   And  Fatigue  X  3 days

## 2013-11-03 ENCOUNTER — Ambulatory Visit (INDEPENDENT_AMBULATORY_CARE_PROVIDER_SITE_OTHER): Payer: 59 | Admitting: Adult Health

## 2013-11-03 ENCOUNTER — Encounter: Payer: Self-pay | Admitting: Adult Health

## 2013-11-03 VITALS — BP 126/74 | HR 54 | Temp 97.9°F | Ht 73.0 in | Wt 183.4 lb

## 2013-11-03 DIAGNOSIS — J471 Bronchiectasis with (acute) exacerbation: Secondary | ICD-10-CM

## 2013-11-03 MED ORDER — IPRATROPIUM BROMIDE 0.06 % NA SOLN
2.0000 | Freq: Four times a day (QID) | NASAL | Status: DC | PRN
Start: 2013-11-03 — End: 2014-10-05

## 2013-11-03 MED ORDER — BENZONATATE 200 MG PO CAPS: 200.0000 mg | ORAL_CAPSULE | Freq: Three times a day (TID) | ORAL | Status: DC | PRN

## 2013-11-03 MED ORDER — HYDROCODONE-HOMATROPINE 5-1.5 MG/5ML PO SYRP: 5.0000 mL | ORAL_SOLUTION | Freq: Every evening | ORAL | Status: DC | PRN

## 2013-11-03 NOTE — Patient Instructions (Signed)
Finish Avelox as directed Mucinex Twice daily  As needed  Cough/congestion  Saline nasal rinses As needed   May use Tessalon Three times a day  As needed  Cough  May use Hydromet 1/2 -1 tsp At bedtime  As needed  Cough-may make you sleepy.  Once done with Avelox , restart triple drug therapy for MAI Follow up Dr. [name]  In 4 weeks and As needed   Please contact office for sooner follow up if symptoms do not improve or worsen or seek emergency care

## 2013-11-03 NOTE — Assessment & Plan Note (Signed)
Resolving flare of acute tracheobronchitis vs bronchiectasis w/ underlying MAI   Plan  Finish Avelox as directed Mucinex Twice daily  As needed  Cough/congestion  Saline nasal rinses As needed   May use Tessalon Three times a day  As needed  Cough  May use Hydromet 1/2 -1 tsp At bedtime  As needed  Cough-may make you sleepy.  Once done with Avelox , restart triple drug therapy for MAI Follow up Dr. [name]  In 4 weeks and As needed   Please contact office for sooner follow up if symptoms do not improve or worsen or seek emergency care

## 2013-11-03 NOTE — Progress Notes (Signed)
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 Cultures were negative. He was treated w abx and clinically improved. Outpt abx ended after 10 days. He feels back to baseline  04/01/13 -- follows up to review CT scan to assess LLL scar and areas of micronodularity. Interestingly the new scan shows resolution of the prior consoilidation and nodules, but evolution of new nodular changes RLL. He remains asymptomatic, no cough, no sputum. He had negative quant gold test a year ago.   ROV 05/16/13 --  Follows up today for abnormal CT scan with LLL linear infiltrate and micronodular disease B. He has been feeling well. He has a stable mild cough, no SOB. No F/C or sick prodrome. He was exposed to pertussis at his clinic 2 days ago, no sx yet. Doing well post-FOB, no evidence complications.   ROV 09/16/13 -- follows for hx MAIC, on therapy since 3 months ago. He is doing well, underwent CT chest today (5/26). Cough is resolved. He is tolerating his abx regimen.   11/03/2013 Acute OV  Complains of 1 week of increased cough with dyspnea, wheezing, head congestion w/ clear drainage, PND.  is now productive with purulent sputm, low grade temp up to 100.4.   Seen at Urgent care, CXR w/ chronic changes, no acute PNA/process. Rx Avelox for 10 days (day 4/10) .  Denies hemoptysis, tightness, swelling, edema , n/v/d.  Has a lot of sinus drip and post nasal drainage.     ROS Constitutional:   No  weight loss, night sweats,  Fevers, chills,  +fatigue, or  lassitude.  HEENT:   No headaches,  Difficulty swallowing,  Tooth/dental problems, or  Sore throat,                No sneezing, itching, ear ache,  +nasal congestion, post nasal drip,   CV:  No chest pain,  Orthopnea, PND, swelling in lower extremities, anasarca, dizziness, palpitations, syncope.   GI  No heartburn, indigestion, abdominal pain, nausea, vomiting, diarrhea, change in bowel  habits, loss of appetite, bloody stools.   Resp:   No chest wall deformity  Skin: no rash or lesions.  GU: no dysuria, change in color of urine, no urgency or frequency.  No flank pain, no hematuria   MS:  No joint pain or swelling.  No decreased range of motion.  No back pain.  Psych:  No change in mood or affect. No depression or anxiety.  No memory loss.        Filed Vitals:   11/03/13 1609  BP: 126/74  Pulse: 54  Temp: 97.9 F (36.6 C)  TempSrc: Oral  Height: 6\' 1"  (1.854 m)  Weight: 183 lb 6.4 oz (83.19 kg)  SpO2: 97%   Gen: Pleasant, well-nourished, in no distress,  normal affect  ENT: No lesions,  mouth clear,  oropharynx clear, no postnasal drip, clear nasal discharge  Neck: No JVD, no TMG, no carotid bruits  Lungs: No use of accessory muscles, faint rhonchi ,  Rattling cough   Cardiovascular: RRR, heart sounds normal, no murmur or gallops, no peripheral edema  Musculoskeletal: No deformities, no cyanosis or clubbing  Neuro: alert, non focal  Skin: Warm, no lesions or rashes   09/16/13 --  COMPARISON: Chest CTs ranging from 07/12/2012 through 03/14/2013.  FINDINGS:  Small mediastinal lymph nodes are stable, not pathologically  enlarged. The thyroid gland and thoracic inlet appear normal. There  is stable  mild atherosclerosis of the aorta, great vessels and  coronary arteries. The ascending aorta is dilated to 4.9 cm.  There is no pleural or pericardial effusion.  Again demonstrated is chronic lung disease with emphysema,  bronchiectasis and fluctuating peribronchial nodularity. Compared  with the most recent study, there is new patchy airspace disease  anteriorly in the right upper lobe (images 32-37). Peripheral right  upper lobe tree-in-bud nodularity has also progressed. Within the  right lower lobe, several of the previously demonstrated nodules  have decreased in size. There is 1 subpleural nodule which is  slightly larger, measuring 10 mm on  image 40. The largest subpleural  nodule within the lingula previously has partially involuted.  The visualized upper abdomen has a stable appearance. There is no  evidence of adrenal mass. Low-density left renal lesion is stable.  There are no worrisome osseous findings.  IMPRESSION:  1. Fluctuating peribronchial nodularity with a tree-in-bud  distribution associated with underlying bronchiectasis. 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  2. No dominant mass or adenopathy to suggest neoplastic process.  3. Atherosclerosis with 4.9 cm ascending aortic aneurysm, slightly  larger than on prior studies. This has been evaluated by recent MRA.   Acute exacerbation of bronchiectasis Resolving flare of acute tracheobronchitis vs bronchiectasis w/ underlying MAI   Plan  Finish Avelox as directed Mucinex Twice daily  As needed  Cough/congestion  Saline nasal rinses As needed   May use Tessalon Three times a day  As needed  Cough  May use Hydromet 1/2 -1 tsp At bedtime  As needed  Cough-may make you sleepy.  Once done with Avelox , restart triple drug therapy for MAI Follow up Dr. [name]  In 4 weeks and As needed   Please contact office for sooner follow up if symptoms do not improve or worsen or seek emergency care

## 2013-11-27 ENCOUNTER — Ambulatory Visit: Payer: 59 | Admitting: Emergency Medicine

## 2013-12-16 ENCOUNTER — Encounter: Payer: Self-pay | Admitting: Emergency Medicine

## 2013-12-16 ENCOUNTER — Ambulatory Visit (INDEPENDENT_AMBULATORY_CARE_PROVIDER_SITE_OTHER): Payer: 59 | Admitting: Emergency Medicine

## 2013-12-16 VITALS — BP 98/60 | HR 53 | Temp 97.8°F | Ht 73.0 in | Wt 183.2 lb

## 2013-12-16 DIAGNOSIS — A318 Other mycobacterial infections: Secondary | ICD-10-CM

## 2013-12-16 DIAGNOSIS — A310 Pulmonary mycobacterial infection: Secondary | ICD-10-CM

## 2013-12-16 DIAGNOSIS — J471 Bronchiectasis with (acute) exacerbation: Secondary | ICD-10-CM

## 2013-12-16 NOTE — Progress Notes (Signed)
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 Cultures were negative. He was treated w abx and clinically improved. Outpt abx ended after 10 days. He feels back to baseline  04/01/13 -- follows up to review CT scan to assess LLL scar and areas of micronodularity. Interestingly the new scan shows resolution of the prior consoilidation and nodules, but evolution of new nodular changes RLL. He remains asymptomatic, no cough, no sputum. He had negative quant gold test a year ago.   ROV 05/16/13 --  Follows up today for abnormal CT scan with LLL linear infiltrate and micronodular disease B. He has been feeling well. He has a stable mild cough, no SOB. No F/C or sick prodrome. He was exposed to pertussis at his clinic 2 days ago, no sx yet. Doing well post-FOB, no evidence complications.   ROV 09/16/13 -- follows for hx MAIC, on therapy since 3 months ago. He is doing well, underwent CT chest today (5/26). Cough is resolved. He is tolerating his abx regimen.   Acute OV 11/03/13 --  Complains of 1 week of increased cough with dyspnea, wheezing, head congestion w/ clear drainage, PND.  is now productive with purulent sputm, low grade temp up to 100.4.   Seen at Urgent care, CXR w/ chronic changes, no acute PNA/process. Rx Avelox for 10 days (day 4/10) .  Denies hemoptysis, tightness, swelling, edema , n/v/d.  Has a lot of sinus drip and post nasal drainage.   ROV 12/16/13 -- follow up for abnormal CT chest due to [facility]. He was recently treated for a bronchitis, temporarily held his [facility] regimen. He is now back on 3 drug regimen. He is not having side effects. His CT scan is ordered for Nov.    Filed Vitals:   12/16/13 0959  BP: 98/60  Pulse: 53  Temp: 97.8 F (36.6 C)  TempSrc: Oral  Height: 6\' 1"  (1.854 m)  Weight: 183 lb 3.2 oz (83.099 kg)  SpO2: 99%   Gen: Pleasant, well-nourished, in no distress,  normal affect  ENT: No  lesions,  mouth clear,  oropharynx clear, no postnasal drip, clear nasal discharge  Neck: No JVD, no TMG, no carotid bruits  Lungs: No use of accessory muscles, faint rhonchi ,  Rattling cough   Cardiovascular: RRR, heart sounds normal, no murmur or gallops, no peripheral edema  Musculoskeletal: No deformities, no cyanosis or clubbing  Neuro: alert, non focal  Skin: Warm, no lesions or rashes   09/16/13 --  COMPARISON: Chest CTs ranging from 07/12/2012 through 03/14/2013.  FINDINGS:  Small mediastinal lymph nodes are stable, not pathologically  enlarged. The thyroid gland and thoracic inlet appear normal. There  is stable mild atherosclerosis of the aorta, great vessels and  coronary arteries. The ascending aorta is dilated to 4.9 cm.  There is no pleural or pericardial effusion.  Again demonstrated is chronic lung disease with emphysema,  bronchiectasis and fluctuating peribronchial nodularity. Compared  with the most recent study, there is new patchy airspace disease  anteriorly in the right upper lobe (images 32-37). Peripheral right  upper lobe tree-in-bud nodularity has also progressed. Within the  right lower lobe, several of the previously demonstrated nodules  have decreased in size. There is 1 subpleural nodule which is  slightly larger, measuring 10 mm on image 40. The largest subpleural  nodule within the lingula previously has partially involuted.  The visualized upper abdomen has a stable appearance. There  is no  evidence of adrenal mass. Low-density left renal lesion is stable.  There are no worrisome osseous findings.  IMPRESSION:  1. Fluctuating peribronchial nodularity with a tree-in-bud  distribution associated with underlying bronchiectasis. 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  2. No dominant mass or adenopathy to suggest neoplastic process.  3. Atherosclerosis with 4.9 cm  ascending aortic aneurysm, slightly  larger than on prior studies. This has been evaluated by recent MRA.   MAC (mycobacterium avium-intracellulare complex) He is tolerating 3-drug abx regimen. I would like for him to stay on this until after his CT scan this Nov. We will review and decide whether we feel good about stopping the meds at that time.   Acute exacerbation of bronchiectasis This is resolved following rx with avelox.

## 2013-12-16 NOTE — Assessment & Plan Note (Signed)
This is resolved following rx with avelox.

## 2013-12-16 NOTE — Assessment & Plan Note (Signed)
He is tolerating 3-drug abx regimen. I would like for him to stay on this until after his CT scan this Nov. We will review and decide whether we feel good about stopping the meds at that time.

## 2013-12-16 NOTE — Patient Instructions (Signed)
Please continue your medications as you are taking them  Arrange follow up with dr [name] after your CT scan is completed

## 2014-03-09 ENCOUNTER — Inpatient Hospital Stay: Admission: RE | Admit: 2014-03-09 | Payer: 59 | Source: Ambulatory Visit

## 2014-03-16 ENCOUNTER — Ambulatory Visit (INDEPENDENT_AMBULATORY_CARE_PROVIDER_SITE_OTHER)
Admission: RE | Admit: 2014-03-16 | Discharge: 2014-03-16 | Disposition: A | Discharge status: Home / Self Care | Payer: 59 | Source: Ambulatory Visit | Attending: Emergency Medicine | Admitting: Emergency Medicine

## 2014-03-16 DIAGNOSIS — A310 Pulmonary mycobacterial infection: Secondary | ICD-10-CM

## 2014-03-31 ENCOUNTER — Telehealth: Payer: Self-pay | Admitting: Emergency Medicine

## 2014-03-31 NOTE — Telephone Encounter (Signed)
Spoke with patient-he will be here on 04/09/14 at 2:30pm with RB. Nothing more needed at this time.

## 2014-04-09 ENCOUNTER — Encounter: Payer: Self-pay | Admitting: Emergency Medicine

## 2014-04-09 ENCOUNTER — Ambulatory Visit (INDEPENDENT_AMBULATORY_CARE_PROVIDER_SITE_OTHER): Payer: 59 | Admitting: Emergency Medicine

## 2014-04-09 VITALS — BP 122/78 | HR 53 | Ht 73.0 in | Wt 191.0 lb

## 2014-04-09 DIAGNOSIS — A310 Pulmonary mycobacterial infection: Secondary | ICD-10-CM

## 2014-04-09 NOTE — Patient Instructions (Signed)
Please stop your antibiotics at this time We will plan to repeat your CT scan of the chest in November 2016 If you develop cough or any other infectious symptoms in the meantime we may decide to repeat your imaging sooner..  Follow with Dr [name] in 02/2015 to review your CT scan

## 2014-04-09 NOTE — Assessment & Plan Note (Signed)
Has done well clinically and improved radiographically. Has been treated for 9 months - I believe we can stop at this time and follow.   Please stop your antibiotics at this time We will plan to repeat your CT scan of the chest in November 2016 If you develop cough or any other infectious symptoms in the meantime we may decide to repeat your imaging sooner..  Follow with Dr [name] in 02/2015 to review your CT scan

## 2014-04-09 NOTE — Progress Notes (Signed)
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 Cultures were negative. He was treated w abx and clinically improved. Outpt abx ended after 10 days. He feels back to baseline  04/01/13 -- follows up to review CT scan to assess LLL scar and areas of micronodularity. Interestingly the new scan shows resolution of the prior consoilidation and nodules, but evolution of new nodular changes RLL. He remains asymptomatic, no cough, no sputum. He had negative quant gold test a year ago.   ROV 05/16/13 --  Follows up today for abnormal CT scan with LLL linear infiltrate and micronodular disease B. He has been feeling well. He has a stable mild cough, no SOB. No F/C or sick prodrome. He was exposed to pertussis at his clinic 2 days ago, no sx yet. Doing well post-FOB, no evidence complications.   ROV 09/16/13 -- follows for hx MAIC, on therapy since 3 months ago. He is doing well, underwent CT chest today (5/26). Cough is resolved. He is tolerating his abx regimen.   Acute OV 11/03/13 --  Complains of 1 week of increased cough with dyspnea, wheezing, head congestion w/ clear drainage, PND.  is now productive with purulent sputm, low grade temp up to 100.4.   Seen at Urgent care, CXR w/ chronic changes, no acute PNA/process. Rx Avelox for 10 days (day 4/10) .  Denies hemoptysis, tightness, swelling, edema , n/v/d.  Has a lot of sinus drip and post nasal drainage.   ROV 12/16/13 -- follow up for abnormal CT chest due to [facility]. He was recently treated for a bronchitis, temporarily held his [facility] regimen. He is now back on 3 drug regimen. He is not having side effects. His CT scan is ordered for Nov.   ROV 04/09/14 --  Follows up for [place]:   04/09/14 1423  BP: 122/78  Pulse: 53  Height: 6\' 1"  (1.854 m)  Weight: 191 lb (86.637 kg)  SpO2: 97%   Gen: Pleasant, well-nourished, in no distress,  normal affect  ENT: No  lesions,  mouth clear,  oropharynx clear, no postnasal drip, clear nasal discharge  Neck: No JVD, no TMG, no carotid bruits  Lungs: No use of accessory muscles, faint rhonchi ,  Rattling cough   Cardiovascular: RRR, heart sounds normal, no murmur or gallops, no peripheral edema  Musculoskeletal: No deformities, no cyanosis or clubbing  Neuro: alert, non focal  Skin: Warm, no lesions or rashes     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 Has been treated for 9 months - I believe we can stop at this time and follow.   Please stop your antibiotics at this time We will plan to repeat your CT scan of the chest in November 2016 If you develop cough or any other infectious symptoms in the meantime we may decide to repeat your imaging sooner..  Follow with Dr [name] in 02/2015 to review your CT scan

## 2014-05-25 ENCOUNTER — Other Ambulatory Visit: Payer: Self-pay | Admitting: Gastroenterology

## 2014-05-25 DIAGNOSIS — R1011 Right upper quadrant pain: Secondary | ICD-10-CM

## 2014-05-26 ENCOUNTER — Ambulatory Visit
Admission: RE | Admit: 2014-05-26 | Discharge: 2014-05-26 | Disposition: A | Discharge status: Home / Self Care | Payer: 59 | Source: Ambulatory Visit | Attending: Gastroenterology | Admitting: Gastroenterology

## 2014-05-26 DIAGNOSIS — R1011 Right upper quadrant pain: Secondary | ICD-10-CM

## 2014-06-26 ENCOUNTER — Other Ambulatory Visit (INDEPENDENT_AMBULATORY_CARE_PROVIDER_SITE_OTHER): Payer: Self-pay | Admitting: General Surgery

## 2014-06-30 ENCOUNTER — Encounter (HOSPITAL_COMMUNITY): Payer: Self-pay | Admitting: Emergency Medicine

## 2014-06-30 ENCOUNTER — Emergency Department (HOSPITAL_COMMUNITY): Payer: 59

## 2014-06-30 ENCOUNTER — Inpatient Hospital Stay (HOSPITAL_COMMUNITY)
Admission: EM | Admit: 2014-06-30 | Discharge: 2014-07-03 | DRG: 375 | Disposition: A | Discharge status: Home / Self Care | Payer: 59 | Attending: Internal Medicine | Admitting: Internal Medicine

## 2014-06-30 DIAGNOSIS — G250 Essential tremor: Secondary | ICD-10-CM | POA: Diagnosis present

## 2014-06-30 DIAGNOSIS — K8020 Calculus of gallbladder without cholecystitis without obstruction: Secondary | ICD-10-CM | POA: Diagnosis present

## 2014-06-30 DIAGNOSIS — K5669 Other intestinal obstruction: Secondary | ICD-10-CM

## 2014-06-30 DIAGNOSIS — R180 Malignant ascites: Secondary | ICD-10-CM | POA: Diagnosis present

## 2014-06-30 DIAGNOSIS — E611 Iron deficiency: Secondary | ICD-10-CM | POA: Diagnosis present

## 2014-06-30 DIAGNOSIS — K56600 Partial intestinal obstruction, unspecified as to cause: Secondary | ICD-10-CM

## 2014-06-30 DIAGNOSIS — Z79899 Other long term (current) drug therapy: Secondary | ICD-10-CM

## 2014-06-30 DIAGNOSIS — K56609 Unspecified intestinal obstruction, unspecified as to partial versus complete obstruction: Secondary | ICD-10-CM

## 2014-06-30 DIAGNOSIS — A312 Disseminated mycobacterium avium-intracellulare complex (DMAC): Secondary | ICD-10-CM | POA: Diagnosis present

## 2014-06-30 DIAGNOSIS — I10 Essential (primary) hypertension: Secondary | ICD-10-CM | POA: Diagnosis present

## 2014-06-30 DIAGNOSIS — K668 Other specified disorders of peritoneum: Secondary | ICD-10-CM

## 2014-06-30 DIAGNOSIS — C786 Secondary malignant neoplasm of retroperitoneum and peritoneum: Secondary | ICD-10-CM | POA: Diagnosis present

## 2014-06-30 DIAGNOSIS — I2510 Atherosclerotic heart disease of native coronary artery without angina pectoris: Secondary | ICD-10-CM | POA: Diagnosis present

## 2014-06-30 DIAGNOSIS — R109 Unspecified abdominal pain: Secondary | ICD-10-CM

## 2014-06-30 DIAGNOSIS — R188 Other ascites: Secondary | ICD-10-CM

## 2014-06-30 DIAGNOSIS — D649 Anemia, unspecified: Secondary | ICD-10-CM

## 2014-06-30 DIAGNOSIS — A310 Pulmonary mycobacterial infection: Secondary | ICD-10-CM | POA: Diagnosis present

## 2014-06-30 DIAGNOSIS — I714 Abdominal aortic aneurysm, without rupture: Secondary | ICD-10-CM | POA: Diagnosis present

## 2014-06-30 HISTORY — DX: Disseminated malignant neoplasm, unspecified: C80.0

## 2014-06-30 HISTORY — DX: Anemia, unspecified: D64.9

## 2014-06-30 LAB — COMPREHENSIVE METABOLIC PANEL
ALBUMIN: 4.3 g/dL (ref 3.5–5.2)
ALT: 22 U/L (ref 0–53)
ANION GAP: 8 (ref 5–15)
AST: 28 U/L (ref 0–37)
Alkaline Phosphatase: 69 U/L (ref 39–117)
BILIRUBIN TOTAL: 0.5 mg/dL (ref 0.3–1.2)
BUN: 17 mg/dL (ref 6–23)
CHLORIDE: 102 mmol/L (ref 96–112)
CO2: 27 mmol/L (ref 19–32)
CREATININE: 1 mg/dL (ref 0.50–1.35)
Calcium: 9.8 mg/dL (ref 8.4–10.5)
GFR, EST AFRICAN AMERICAN: 88 mL/min — AB (ref >90)
GFR, EST NON AFRICAN AMERICAN: 76 mL/min — AB (ref >90)
GLUCOSE: 124 mg/dL — AB (ref 70–99)
Potassium: 4.5 mmol/L (ref 3.5–5.1)
Sodium: 137 mmol/L (ref 135–145)
Total Protein: 7.6 g/dL (ref 6.0–8.3)

## 2014-06-30 LAB — CBC WITH DIFFERENTIAL/PLATELET
BASOS PCT: 0 % (ref 0–1)
Basophils Absolute: 0 10*3/uL (ref 0.0–0.1)
EOS ABS: 0.2 10*3/uL (ref 0.0–0.7)
Eosinophils Relative: 2 % (ref 0–5)
HCT: 35.9 % — ABNORMAL LOW (ref 39.0–52.0)
Hemoglobin: 11.6 g/dL — ABNORMAL LOW (ref 13.0–17.0)
LYMPHS ABS: 1.7 10*3/uL (ref 0.7–4.0)
Lymphocytes Relative: 15 % (ref 12–46)
MCH: 29.1 pg (ref 26.0–34.0)
MCHC: 32.3 g/dL (ref 30.0–36.0)
MCV: 90.2 fL (ref 78.0–100.0)
Monocytes Absolute: 0.8 10*3/uL (ref 0.1–1.0)
Monocytes Relative: 8 % (ref 3–12)
NEUTROS PCT: 75 % (ref 43–77)
Neutro Abs: 8.1 10*3/uL — ABNORMAL HIGH (ref 1.7–7.7)
PLATELETS: 395 10*3/uL (ref 150–400)
RBC: 3.98 MIL/uL — ABNORMAL LOW (ref 4.22–5.81)
RDW: 12.5 % (ref 11.5–15.5)
WBC: 10.8 10*3/uL — ABNORMAL HIGH (ref 4.0–10.5)

## 2014-06-30 LAB — I-STAT CG4 LACTIC ACID, ED
Lactic Acid, Venous: 1.37 mmol/L (ref 0.5–2.0)
Lactic Acid, Venous: 0.58 mmol/L (ref 0.5–2.0)

## 2014-06-30 LAB — CBG MONITORING, ED: GLUCOSE-CAPILLARY: 152 mg/dL — AB (ref 70–99)

## 2014-06-30 LAB — LIPASE, BLOOD: Lipase: 39 U/L (ref 11–59)

## 2014-06-30 MED ORDER — IOHEXOL 300 MG/ML  SOLN
100.0000 mL | Freq: Once | INTRAMUSCULAR | Status: AC | PRN
  Administered 2014-06-30: 100 mL via INTRAVENOUS

## 2014-06-30 MED ORDER — ONDANSETRON HCL 4 MG/2ML IJ SOLN
4.0000 mg | Freq: Once | INTRAMUSCULAR | Status: AC
  Administered 2014-06-30: 4 mg via INTRAVENOUS
  Filled 2014-06-30: qty 2

## 2014-06-30 MED ORDER — ONDANSETRON 4 MG PO TBDP: 8.0000 mg | ORAL_TABLET | Freq: Once | ORAL | Status: DC

## 2014-06-30 MED ORDER — MORPHINE SULFATE 4 MG/ML IJ SOLN
6.0000 mg | Freq: Once | INTRAMUSCULAR | Status: AC
  Administered 2014-06-30: 6 mg via INTRAVENOUS
  Filled 2014-06-30: qty 2

## 2014-06-30 MED ORDER — SODIUM CHLORIDE 0.9 % IV BOLUS (SEPSIS)
1000.0000 mL | Freq: Once | INTRAVENOUS | Status: AC
  Administered 2014-06-30: 1000 mL via INTRAVENOUS

## 2014-06-30 MED ORDER — IOHEXOL 300 MG/ML  SOLN
25.0000 mL | Freq: Once | INTRAMUSCULAR | Status: AC | PRN
  Administered 2014-06-30: 25 mL via ORAL

## 2014-06-30 NOTE — ED Notes (Signed)
Pt noted to have syncopal episode in triage after blood draw- pt remained alert and responding appropriately.  Pt pale and diaphoretic upon RN arrival into room.  Pt taken straight back to trauma room, MD at bedside.

## 2014-06-30 NOTE — ED Notes (Addendum)
Pt presents with generalized abdominal pain onset 4pm described as sharp in nature, pt is scheduled to have gallbladder removed on 07/07/14- pt reports pain is worse today than before.

## 2014-06-30 NOTE — ED Notes (Signed)
Patient transported to CT 

## 2014-06-30 NOTE — H&P (Signed)
[facility] History and Physical  LEVERETT CAMPLIN ZDG:[id number] DOB: 03/27/1946 DOA: 06/30/2014  Referring physician: Delora Fuel, MD PCP: Gerrit Heck, MD   Chief Complaint: Abdominal Pain  HPI: Wesley Dillon is a 69 y.o. male presents with abdominal pain. He sates that the pain comes in attacks and goes away spontaneously. He states no inciting factors and no relieving factors. He states that there is no relation with the food. He states he has had nausea associated no weight loss is noted. He has no hematemesis. He states he checked his stool for blood and this was positive. He saw a GI and had an EGD which showed some gastritis. He was started on PPI for about a month with no real improvement. Patient had an ultrasound and at that time he was noted to have gallstones. He was scheduled for surgery next Tuesday but his pain got worse today. In the ED he had a CT scan done and this shows possible carcinomatosis with free ascites. He has been also noted to be anemic. He has no SOB. He states that when he exerts he will get a little SOB. He has never smoked and he states he is casual drinking. He has a history of MAC and has been treated for 9 months with a three drug regimen.   Review of Systems:  Constitutional:  No weight loss, night sweats, Fevers, chills, fatigue.  HEENT:  No headaches, Difficulty swallowing Cardio-vascular:  No chest pain, Orthopnea, PND, swelling in lower extremities, anasarca, dizziness GI:  No heartburn, indigestion, ++abdominal pain, ++nausea, ++vomiting, no diarrhea, change in bowel habits  Resp:  +shortness of breath with exertion. No coughing up of blood.No change in color of mucus.No wheezing.No chest wall deformity  Skin:  no rash or lesions.  GU:  no dysuria, change in color of urine, no urgency or frequency Musculoskeletal:  No joint pain or swelling. No decreased range of motion. No back pain.  Psych:  No change in mood or affect. No  depression or anxiety  Past Medical History  Diagnosis Date  . Tremor     takes Primidone 250 once daily and inderall 160 daily for this-has had it since he was 21  . Pneumonia 06/2012  . Macular degeneration   . Benign essential tremor   . Coronary artery disease   . Anemia 06/30/2014   Past Surgical History  Procedure Laterality Date  . Tonsillectomy    . Video bronchoscopy Bilateral 04/29/2013    Procedure: VIDEO BRONCHOSCOPY WITHOUT FLUORO;  Surgeon: Collene Gobble, MD;  Location: [facility] ENDOSCOPY;  Service: Cardiopulmonary;  Laterality: Bilateral;   Social History:  reports that he has never smoked. He has never used smokeless tobacco. He reports that he does not drink alcohol or use illicit drugs.  No Known Allergies  Family History  Problem Relation Age of Onset  . CAD Father 48    Died MI  . Atrial fibrillation Father   . Asthma Father   . Alzheimer's disease Mother   . Diabetes Mother   . Heart disease Mother   . Atrial fibrillation Mother   . Seizures Son     Died status epilepticus    Prior to Admission medications   Medication Sig Start Date End Date Taking? Authorizing Provider  atorvastatin (LIPITOR) 10 MG tablet Take 10 mg by mouth daily.   Yes Historical Provider, MD  ipratropium (ATROVENT) 0.06 % nasal spray Place 2 sprays into both nostrils every 6 (six) hours as needed  for rhinitis. 11/03/13  Yes Tammy S Parrett, NP  losartan (COZAAR) 25 MG tablet Take 1 tablet (25 mg total) by mouth daily. 09/17/13  Yes Sherren Mocha, MD  Multiple Vitamins-Minerals (PRESERVISION AREDS PO) Take 2 capsules by mouth daily.    Yes Historical Provider, MD  primidone (MYSOLINE) 250 MG tablet Take 1 tablet (250 mg total) by mouth at bedtime. 10/21/13  Yes Carmen Dohmeier, MD  propranolol ER (INDERAL LA) 80 MG 24 hr capsule Take 1 capsule (80 mg total) by mouth daily. 10/21/13  Yes Carmen Dohmeier, MD  azithromycin (ZITHROMAX) 500 MG tablet Take 1 tablet (500 mg total) by mouth 3 (three)  times a week. Patient not taking: Reported on 06/30/2014 09/16/13   Collene Gobble, MD  ethambutol (MYAMBUTOL) 400 MG tablet Take 5 tablets (2,000 mg total) by mouth 3 (three) times a week. Patient not taking: Reported on 06/30/2014 09/16/13   Collene Gobble, MD  rifabutin (MYCOBUTIN) 150 MG capsule Take 2 capsules (300 mg total) by mouth 3 (three) times a week. Patient not taking: Reported on 06/30/2014 09/16/13   Collene Gobble, MD   Physical Exam: Filed Vitals:   06/30/14 2100 06/30/14 2115 06/30/14 2200 06/30/14 2300  BP: 134/68 133/69 121/66 105/68  Pulse:   58 60  Temp:      TempSrc:      Resp: 17 17 14 16   SpO2:   99% 97%    Wt Readings from Last 3 Encounters:  04/09/14 86.637 kg (191 lb)  12/16/13 83.099 kg (183 lb 3.2 oz)  11/03/13 83.19 kg (183 lb 6.4 oz)    General:  Appears calm and comfortable Eyes: PERRL, normal lids, irises & conjunctiva ENT: grossly normal hearing, lips & tongue Neck: no LAD, masses or thyromegaly Cardiovascular: RRR, no m/r/g. No LE edema. Respiratory: CTA bilaterally, no w/r/r. Normal respiratory effort. Abdomen: soft, ntnd Skin: no rash or induration seen on limited exam Musculoskeletal: grossly normal tone BUE/BLE Psychiatric: grossly normal mood and affect, speech fluent and appropriate Neurologic: grossly non-focal.          Labs on Admission:  Basic Metabolic Panel:  Recent Labs Lab 06/30/14 1940  NA 137  K 4.5  CL 102  CO2 27  GLUCOSE 124*  BUN 17  CREATININE 1.00  CALCIUM 9.8   Liver Function Tests:  Recent Labs Lab 06/30/14 1940  AST 28  ALT 22  ALKPHOS 69  BILITOT 0.5  PROT 7.6  ALBUMIN 4.3    Recent Labs Lab 06/30/14 1940  LIPASE 39   No results for input(s): AMMONIA in the last 168 hours. CBC:  Recent Labs Lab 06/30/14 1940  WBC 10.8*  NEUTROABS 8.1*  HGB 11.6*  HCT 35.9*  MCV 90.2  PLT 395   Cardiac Enzymes: No results for input(s): CKTOTAL, CKMB, CKMBINDEX, TROPONINI in the last 168  hours.  BNP (last 3 results) No results for input(s): BNP in the last 8760 hours.  ProBNP (last 3 results) No results for input(s): PROBNP in the last 8760 hours.  CBG:  Recent Labs Lab 06/30/14 1945  GLUCAP 152*    Radiological Exams on Admission: Ct Abdomen Pelvis W Contrast  06/30/2014   CLINICAL DATA:  Abdominal pain this evening. Intermittent pain for 3 months. Syncopal episode.  EXAM: CT ABDOMEN AND PELVIS WITH CONTRAST  TECHNIQUE: Multidetector CT imaging of the abdomen and pelvis was performed using the standard protocol following bolus administration of intravenous contrast.  CONTRAST:  154mL OMNIPAQUE IOHEXOL 300 MG/ML  SOLN  COMPARISON:  Ultrasound abdomen 05/26/2014  FINDINGS: Lung bases demonstrate evidence of bronchiectasis. No focal infiltration. Ascending thoracic aorta appears dilated although incompletely included on the study.  The gallbladder is mildly distended. Stones seen at ultrasound are not definitely demonstrated on CT, likely non radiopaque. There is mild intra and extrahepatic bile duct dilatation of uncertain significance. No common duct stone is identified but given the stones are not radiopaque, cannot be excluded. No focal liver lesions identified. The pancreas, spleen, adrenal glands, abdominal aorta, inferior vena cava, and retroperitoneal lymph nodes are unremarkable. Mild calcification of the aorta. There is moderate free fluid, likely ascites around the liver and extending along the right pericolic gutter into the pelvis. Diffuse omental and peritoneal nodularity most prominent in the right upper quadrant. Appearance is worrisome for peritoneal carcinomatosis. Small esophageal hiatal hernia. Stomach appears otherwise normal. There is mild dilatation of fluid-filled small bowel down to the level of the mid/distal ileum where there is a transition zone to decompressed bowel in the right lower quadrant. This suggests a partial small bowel obstruction. Cause of  obstruction is not demonstrated. Colon is mostly decompressed. Small cyst in the left kidney. No solid mass or hydronephrosis in either kidney. No free air in the abdomen.  Pelvis: Prostate gland is mildly enlarged. Bladder wall is not thickened. Free fluid in the pelvis extending down from the abdomen. Appendix is segmentally visualized and appears normal. No pelvic mass or lymphadenopathy. Mild degenerative changes in the spine. No destructive bone lesions.  IMPRESSION: Dilatation of fluid-filled small bowel with transition zone in the right lower quadrant consistent with partial obstruction. Mild to moderate right abdominal and pelvic ascites. 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 Distended gallbladder with mild bile duct dilatation. Stones seen a previous ultrasound are not visualized and are likely non radiopaque. Small esophageal hiatal hernia. Bronchiectasis in the lung bases.   Electronically Signed   By: Lucienne Capers M.D.   On: 06/30/2014 22:11     Assessment/Plan Active Problems:   Coronary atherosclerosis of native coronary artery   MAC (mycobacterium avium-intracellulare complex)   SBO (small bowel obstruction)   Anemia   1. SBO -will keep NPO for now -general surgery consult called by ED -started on IVF -monitor labs  2. MAC -patient has completed 9 months of therapy -findings of the CT may be related to history of MAC  3. Anemia -will check iron studies -has history of stool occult blood -will recheck stools  4. Abnormal CT findings -findings suggestive of carcinomatosis on CT scan -will need further evaluation possible biopsy -has negative liver and negative lymph node enlargement -?possible relation to history of MAC     Code Status: Full COde (must indicate code status--if unknown or must be presumed, indicate so) DVT Prophylaxis:SCD Family Communication: wife (indicate person spoken with, if applicable, with phone  number if by telephone) Disposition Plan: Home (indicate anticipated LOS)  Time spent: 71min  Lisia Westbay A [facility] Pager [phone]

## 2014-06-30 NOTE — ED Provider Notes (Signed)
CSN: [id number]     Arrival date & time 06/30/14  1919 History   First MD Initiated Contact with Patient 06/30/14 1939     Chief Complaint  Patient presents with  . Abdominal Pain     (Consider location/radiation/quality/duration/timing/severity/associated sxs/prior Treatment) The history is provided by the patient and medical records.   Pleasant 69 year old male with past medical history of chronic mycobacterium avium infection who presents with generalized abdominal pain. The patient has a history of several months of intermittent abdominal pain, for which he had an EGD which showed gastritis as well as RUQ U/S showing gallstones, for which he is scheduled to have a cholecystectomy next Tuesday. He took a PPI for approximately one month with no real improvement in his pain. Earlier today, the patient began having worsening generalized abdominal pain. It is somewhat worse in the right upper quadrant, but is also generalized, which is atypical for his gallbladder pain. He describes the pain as a constant gnawing sensation that intermittently worsens and cramps. He has had associated nausea and one episode of emesis in the ED. While in the waiting room, the patient had blood work drawn at which point he became acutely diaphoretic, pale, and syncopized. He was brought immediately to the trauma bay for further evaluation. Upon arrival in the trauma bay, the patient had regained consciousness. He describes general malaise and fatigue, as well as ongoing abdominal pain but has no chest pain or palpitations. No shortness of breath.  Past Medical History  Diagnosis Date  . Tremor     takes Primidone 250 once daily and inderall 160 daily for this-has had it since he was 21  . Pneumonia 06/2012  . Macular degeneration   . Benign essential tremor   . Coronary artery disease   . Anemia 06/30/2014   Past Surgical History  Procedure Laterality Date  . Tonsillectomy    . Video bronchoscopy Bilateral  04/29/2013    Procedure: VIDEO BRONCHOSCOPY WITHOUT FLUORO;  Surgeon: Collene Gobble, MD;  Location: [facility];  Service: Cardiopulmonary;  Laterality: Bilateral;   Family History  Problem Relation Age of Onset  . CAD Father 17    Died MI  . Atrial fibrillation Father   . Asthma Father   . Alzheimer's disease Mother   . Diabetes Mother   . Heart disease Mother   . Atrial fibrillation Mother   . Seizures Son     Died status epilepticus   History  Substance Use Topics  . Smoking status: Never Smoker   . Smokeless tobacco: Never Used  . Alcohol Use: No     Comment: wine with dinner    Review of Systems  Constitutional: Positive for diaphoresis and fatigue. Negative for fever.  HENT: Negative for trouble swallowing.   Eyes: Negative for visual disturbance.  Respiratory: Negative for cough and shortness of breath.   Cardiovascular: Negative for chest pain and palpitations.  Gastrointestinal: Positive for nausea, vomiting (Once) and abdominal pain. Negative for diarrhea.  Genitourinary: Negative for flank pain.  Musculoskeletal: Negative for neck pain.  Skin: Negative for rash and wound.  Allergic/Immunologic: Negative for immunocompromised state.  Neurological: Positive for syncope.      Allergies  Review of patient's allergies indicates no known allergies.  Home Medications   Prior to Admission medications   Medication Sig Start Date End Date Taking? Authorizing Provider  atorvastatin (LIPITOR) 10 MG tablet Take 10 mg by mouth daily.   Yes Historical Provider, MD  ipratropium (ATROVENT) 0.06 %  nasal spray Place 2 sprays into both nostrils every 6 (six) hours as needed for rhinitis. 11/03/13  Yes Tammy S Parrett, NP  losartan (COZAAR) 25 MG tablet Take 1 tablet (25 mg total) by mouth daily. 09/17/13  Yes Sherren Mocha, MD  Multiple Vitamins-Minerals (PRESERVISION AREDS PO) Take 2 capsules by mouth daily.    Yes Historical Provider, MD  primidone (MYSOLINE) 250 MG tablet  Take 1 tablet (250 mg total) by mouth at bedtime. 10/21/13  Yes Carmen Dohmeier, MD  propranolol ER (INDERAL LA) 80 MG 24 hr capsule Take 1 capsule (80 mg total) by mouth daily. 10/21/13  Yes Carmen Dohmeier, MD  azithromycin (ZITHROMAX) 500 MG tablet Take 1 tablet (500 mg total) by mouth 3 (three) times a week. Patient not taking: Reported on 06/30/2014 09/16/13   Collene Gobble, MD  ethambutol (MYAMBUTOL) 400 MG tablet Take 5 tablets (2,000 mg total) by mouth 3 (three) times a week. Patient not taking: Reported on 06/30/2014 09/16/13   Collene Gobble, MD  rifabutin (MYCOBUTIN) 150 MG capsule Take 2 capsules (300 mg total) by mouth 3 (three) times a week. Patient not taking: Reported on 06/30/2014 09/16/13   Collene Gobble, MD   BP 119/69 mmHg  Pulse 62  Temp(Src) 97.9 F (36.6 C) (Oral)  Resp 18  Ht 6\' 1"  (1.854 m)  Wt 190 lb 0.6 oz (86.2 kg)  BMI 25.08 kg/m2  SpO2 99% Physical Exam  Constitutional: He is oriented to person, place, and time. He appears well-developed and well-nourished.  Non-toxic appearance. He appears ill. He appears distressed.  HENT:  Head: Normocephalic and atraumatic.  Mouth/Throat: No oropharyngeal exudate.  Eyes: Conjunctivae are normal. Pupils are equal, round, and reactive to light.  Neck: Normal range of motion. Neck supple.  Cardiovascular: Normal rate, normal heart sounds and intact distal pulses.  Exam reveals no friction rub.   No murmur heard. Pulmonary/Chest: Effort normal and breath sounds normal. No respiratory distress. He has no wheezes. He has no rales.  Abdominal: Soft. Normal appearance. There is generalized tenderness (Generalized tenderness, wose in right upper quadrant). There is positive Murphy's sign. There is no rigidity, no rebound, no guarding and no tenderness at McBurney's point.  Musculoskeletal: He exhibits no edema.  Neurological: He is alert and oriented to person, place, and time.  Skin: Skin is warm. No rash noted.  Nursing note and  vitals reviewed.   ED Course  Procedures (including critical care time)   EMERGENCY DEPARTMENT [place] FAST EXAM  INDICATIONS:Hypotension, transient, and syncope with abdominal pain  PERFORMED BY: Myself  FINDINGS: RUQ view positive, Pelvic view positive and Pericardial effusion absent  LIMITATIONS:  Emergent procedure  INTERPRETATION:  No pericardial effusion and Abdominal free fluid present  COMMENT:  Free fluid also noted surrounding free-floating bowel loops concerning for ascites.  Labs Review Labs Reviewed  COMPREHENSIVE METABOLIC PANEL - Abnormal; Notable for the following:    Glucose, Bld 124 (*)    GFR calc non Af Amer 76 (*)    GFR calc Af Amer 88 (*)    All other components within normal limits  CBC WITH DIFFERENTIAL/PLATELET - Abnormal; Notable for the following:    WBC 10.8 (*)    RBC 3.98 (*)    Hemoglobin 11.6 (*)    HCT 35.9 (*)    Neutro Abs 8.1 (*)    All other components within normal limits  GLUCOSE, CAPILLARY - Abnormal; Notable for the following:    Glucose-Capillary 114 (*)  All other components within normal limits  CBG MONITORING, ED - Abnormal; Notable for the following:    Glucose-Capillary 152 (*)    All other components within normal limits  LIPASE, BLOOD  FERRITIN  IRON AND TIBC  CBC  CREATININE, SERUM  TSH  HEMOGLOBIN A1C  OCCULT BLOOD X 1 CARD TO LAB, STOOL  CBC  COMPREHENSIVE METABOLIC PANEL  I-STAT CG4 LACTIC ACID, ED  I-STAT CG4 LACTIC ACID, ED    Imaging Review Ct Abdomen Pelvis W Contrast  06/30/2014   CLINICAL DATA:  Abdominal pain this evening. Intermittent pain for 3 months. Syncopal episode.  EXAM: CT ABDOMEN AND PELVIS WITH CONTRAST  TECHNIQUE: Multidetector CT imaging of the abdomen and pelvis was performed using the standard protocol following bolus administration of intravenous contrast.  CONTRAST:  139mL OMNIPAQUE IOHEXOL 300 MG/ML  SOLN  COMPARISON:  Ultrasound abdomen 05/26/2014  FINDINGS: Lung bases demonstrate  evidence of bronchiectasis. No focal infiltration. Ascending thoracic aorta appears dilated although incompletely included on the study.  The gallbladder is mildly distended. Stones seen at ultrasound are not definitely demonstrated on CT, likely non radiopaque. There is mild intra and extrahepatic bile duct dilatation of uncertain significance. No common duct stone is identified but given the stones are not radiopaque, cannot be excluded. No focal liver lesions identified. The pancreas, spleen, adrenal glands, abdominal aorta, inferior vena cava, and retroperitoneal lymph nodes are unremarkable. Mild calcification of the aorta. There is moderate free fluid, likely ascites around the liver and extending along the right pericolic gutter into the pelvis. Diffuse omental and peritoneal nodularity most prominent in the right upper quadrant. Appearance is worrisome for peritoneal carcinomatosis. Small esophageal hiatal hernia. Stomach appears otherwise normal. There is mild dilatation of fluid-filled small bowel down to the level of the mid/distal ileum where there is a transition zone to decompressed bowel in the right lower quadrant. This suggests a partial small bowel obstruction. Cause of obstruction is not demonstrated. Colon is mostly decompressed. Small cyst in the left kidney. No solid mass or hydronephrosis in either kidney. No free air in the abdomen.  Pelvis: Prostate gland is mildly enlarged. Bladder wall is not thickened. Free fluid in the pelvis extending down from the abdomen. Appendix is segmentally visualized and appears normal. No pelvic mass or lymphadenopathy. Mild degenerative changes in the spine. No destructive bone lesions.  IMPRESSION: Dilatation of fluid-filled small bowel with transition zone in the right lower quadrant consistent with partial obstruction. Mild to moderate right abdominal and pelvic ascites. 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 Distended  gallbladder with mild bile duct dilatation. Stones seen a previous ultrasound are not visualized and are likely non radiopaque. Small esophageal hiatal hernia. Bronchiectasis in the lung bases.   Electronically Signed   By: Lucienne Capers M.D.   On: 06/30/2014 22:11     EKG Interpretation   Date/Time:  Tuesday June 30 2014 19:45:30 EST Ventricular Rate:  63 PR Interval:  153 QRS Duration: 92 QT Interval:  415 QTC Calculation: 425 R Axis:   43 Text Interpretation:  Sinus rhythm RSR' in V1 or V2, probably normal  variant When compared with ECG of 07/12/2012, No significant change was  found Confirmed by [facility]  MD, Bonner ([id number]) on 06/30/2014 7:48:33 PM      MDM   Pleasant 69 year old male with past medical history of chronic mycobacterium avium infection who presents with generalized abdominal pain and syncopal episode in setting of blood draw. See HPI above. On arrival,  T 97.39F, HR 85, RR 16, BP 145/86, satting 99% on RA. Exam as above, remarkable for pale, but non-toxic male with diffuse abdominal TTP, worse in RUQ, with + Murphy's. Bedside FAST exam + for free fluid but pressures now stable, HR not tachycardic. Syncopal episode occurred in setting of blood draw and is likely vasovagal. No current CP, SOB, palpitations, or signs of ACS on EKG. No focal neuro deficits. Initial DDx includes acute cholecystitis with possible GB rupture, pancreatitis, hepatitis, gastritis, PUD, ileitis, colitis, new-onset ascites. No AAA seen on recent scans, BP stable, do not suspect AAA rupture. Will start IVF, send broad labs and plan for CT A/P.  Labs as above. CBC ith WBC 10.8, Hb 11.6. CMP with normal LFTs and Bili, reassuring for no acute cholecystitis or cholangitis. Lipase normal. Lactate 1.37. VSS and BP stable. No further syncopal episodes. CT pending.  CT scan shows parial SBO with transition in RLQ, which diffuse omental and peritoneal nodularity c/f carcinomatosis. GB distended as well but without  evidence of severe cholecystitis. Imaging and exam c/f diffuse carcinomatosis. Unclear etiology - primary consideration is malignancy. Pt also has h/o MAC which could cause granulomatous disease. No prior h/o abdominal surgeries making SBO concerning for possible underlying mass as lead point. D/w surgery who will evaluate. NPO. Will admit to medicine. Pain controlled.  Clinical Impression: 1. Partial small bowel obstruction   2. Abdominal pain, unspecified abdominal location   3. Omental mass  4. Ascites    Disposition: Admit  Condition: Stable  Pt seen in conjunction with Dr. Garnette Scheuermann, MD 23/30/07 [id number]  Delora Fuel, MD 33/35/45 [id number]

## 2014-06-30 NOTE — ED Notes (Signed)
CBG 152  

## 2014-07-01 ENCOUNTER — Encounter (HOSPITAL_COMMUNITY): Payer: Self-pay | Admitting: *Deleted

## 2014-07-01 ENCOUNTER — Inpatient Hospital Stay (HOSPITAL_COMMUNITY): Payer: 59

## 2014-07-01 DIAGNOSIS — K668 Other specified disorders of peritoneum: Secondary | ICD-10-CM

## 2014-07-01 DIAGNOSIS — I10 Essential (primary) hypertension: Secondary | ICD-10-CM

## 2014-07-01 LAB — COMPREHENSIVE METABOLIC PANEL
ALBUMIN: 3.3 g/dL — AB (ref 3.5–5.2)
ALK PHOS: 56 U/L (ref 39–117)
ALT: 19 U/L (ref 0–53)
ANION GAP: 3 — AB (ref 5–15)
AST: 23 U/L (ref 0–37)
BILIRUBIN TOTAL: 0.5 mg/dL (ref 0.3–1.2)
BUN: 15 mg/dL (ref 6–23)
CALCIUM: 8.2 mg/dL — AB (ref 8.4–10.5)
CO2: 27 mmol/L (ref 19–32)
CREATININE: 0.92 mg/dL (ref 0.50–1.35)
Chloride: 108 mmol/L (ref 96–112)
GFR calc Af Amer: >90 mL/min (ref >90)
GFR, EST NON AFRICAN AMERICAN: 85 mL/min — AB (ref >90)
Glucose, Bld: 118 mg/dL — ABNORMAL HIGH (ref 70–99)
Potassium: 4.3 mmol/L (ref 3.5–5.1)
SODIUM: 138 mmol/L (ref 135–145)
Total Protein: 5.8 g/dL — ABNORMAL LOW (ref 6.0–8.3)

## 2014-07-01 LAB — IRON AND TIBC
Iron: 33 ug/dL — ABNORMAL LOW (ref 42–165)
Saturation Ratios: 8 % — ABNORMAL LOW (ref 20–55)
TIBC: 415 ug/dL (ref 215–435)
UIBC: 382 ug/dL (ref 125–400)

## 2014-07-01 LAB — CBC
HCT: 31 % — ABNORMAL LOW (ref 39.0–52.0)
HCT: 30.1 % — ABNORMAL LOW (ref 39.0–52.0)
HEMOGLOBIN: 10.1 g/dL — AB (ref 13.0–17.0)
Hemoglobin: 9.5 g/dL — ABNORMAL LOW (ref 13.0–17.0)
MCH: 29.5 pg (ref 26.0–34.0)
MCH: 28.9 pg (ref 26.0–34.0)
MCHC: 32.6 g/dL (ref 30.0–36.0)
MCHC: 31.6 g/dL (ref 30.0–36.0)
MCV: 90.6 fL (ref 78.0–100.0)
MCV: 91.5 fL (ref 78.0–100.0)
PLATELETS: 299 10*3/uL (ref 150–400)
PLATELETS: 285 10*3/uL (ref 150–400)
RBC: 3.42 MIL/uL — ABNORMAL LOW (ref 4.22–5.81)
RBC: 3.29 MIL/uL — AB (ref 4.22–5.81)
RDW: 12.7 % (ref 11.5–15.5)
RDW: 12.6 % (ref 11.5–15.5)
WBC: 9.5 10*3/uL (ref 4.0–10.5)
WBC: 7.6 10*3/uL (ref 4.0–10.5)

## 2014-07-01 LAB — GLUCOSE, CAPILLARY
Glucose-Capillary: 111 mg/dL — ABNORMAL HIGH (ref 70–99)
Glucose-Capillary: 114 mg/dL — ABNORMAL HIGH (ref 70–99)

## 2014-07-01 LAB — FERRITIN: Ferritin: 10 ng/mL — ABNORMAL LOW (ref 22–322)

## 2014-07-01 LAB — CREATININE, SERUM
CREATININE: 0.92 mg/dL (ref 0.50–1.35)
GFR calc non Af Amer: 85 mL/min — ABNORMAL LOW (ref >90)

## 2014-07-01 LAB — TSH: TSH: 1.468 u[IU]/mL (ref 0.350–4.500)

## 2014-07-01 MED ORDER — FOLIC ACID 1 MG PO TABS
1.0000 mg | ORAL_TABLET | Freq: Every day | ORAL | Status: DC
  Administered 2014-07-01 – 2014-07-03 (×3): 1 mg via ORAL
  Filled 2014-07-01 (×3): qty 1

## 2014-07-01 MED ORDER — HYDROCODONE-ACETAMINOPHEN 5-325 MG PO TABS: 1.0000 | ORAL_TABLET | ORAL | Status: DC | PRN

## 2014-07-01 MED ORDER — PROPRANOLOL HCL ER 80 MG PO CP24
80.0000 mg | ORAL_CAPSULE | Freq: Every day | ORAL | Status: DC
  Administered 2014-07-01 – 2014-07-03 (×2): 80 mg via ORAL
  Filled 2014-07-01 (×3): qty 1

## 2014-07-01 MED ORDER — MORPHINE SULFATE 2 MG/ML IJ SOLN
2.0000 mg | INTRAMUSCULAR | Status: DC | PRN
  Administered 2014-07-01: 2 mg via INTRAVENOUS
  Filled 2014-07-01: qty 1

## 2014-07-01 MED ORDER — PRIMIDONE 250 MG PO TABS
250.0000 mg | ORAL_TABLET | Freq: Every day | ORAL | Status: DC
  Administered 2014-07-01 – 2014-07-02 (×2): 250 mg via ORAL
  Filled 2014-07-01 (×4): qty 1

## 2014-07-01 MED ORDER — ONDANSETRON HCL 4 MG/2ML IJ SOLN
4.0000 mg | Freq: Four times a day (QID) | INTRAMUSCULAR | Status: DC | PRN
  Administered 2014-07-01: 4 mg via INTRAVENOUS
  Filled 2014-07-01: qty 2

## 2014-07-01 MED ORDER — IPRATROPIUM BROMIDE 0.06 % NA SOLN
2.0000 | Freq: Three times a day (TID) | NASAL | Status: DC
  Filled 2014-07-01: qty 15

## 2014-07-01 MED ORDER — DEXTROSE-NACL 5-0.9 % IV SOLN
INTRAVENOUS | Status: DC
  Administered 2014-07-01 (×2): via INTRAVENOUS

## 2014-07-01 MED ORDER — FENTANYL CITRATE 0.05 MG/ML IJ SOLN
INTRAMUSCULAR | Status: AC | PRN
  Administered 2014-07-01: 50 ug via INTRAVENOUS

## 2014-07-01 MED ORDER — ATORVASTATIN CALCIUM 10 MG PO TABS
10.0000 mg | ORAL_TABLET | Freq: Every day | ORAL | Status: DC
  Administered 2014-07-01 – 2014-07-03 (×3): 10 mg via ORAL
  Filled 2014-07-01 (×3): qty 1

## 2014-07-01 MED ORDER — MIDAZOLAM HCL 2 MG/2ML IJ SOLN
INTRAMUSCULAR | Status: AC
  Filled 2014-07-01: qty 4

## 2014-07-01 MED ORDER — LIDOCAINE HCL 1 % IJ SOLN
INTRAMUSCULAR | Status: AC
  Filled 2014-07-01: qty 20

## 2014-07-01 MED ORDER — LOSARTAN POTASSIUM 25 MG PO TABS
25.0000 mg | ORAL_TABLET | Freq: Every day | ORAL | Status: DC
  Administered 2014-07-01 – 2014-07-03 (×3): 25 mg via ORAL
  Filled 2014-07-01 (×3): qty 1

## 2014-07-01 MED ORDER — FENTANYL CITRATE 0.05 MG/ML IJ SOLN
INTRAMUSCULAR | Status: AC
  Filled 2014-07-01: qty 4

## 2014-07-01 MED ORDER — VITAMIN B-1 100 MG PO TABS
100.0000 mg | ORAL_TABLET | Freq: Every day | ORAL | Status: DC
  Administered 2014-07-01 – 2014-07-03 (×3): 100 mg via ORAL
  Filled 2014-07-01 (×3): qty 1

## 2014-07-01 MED ORDER — ONDANSETRON HCL 4 MG PO TABS
4.0000 mg | ORAL_TABLET | Freq: Four times a day (QID) | ORAL | Status: DC | PRN
  Administered 2014-07-03: 4 mg via ORAL
  Filled 2014-07-01: qty 1

## 2014-07-01 MED ORDER — MIDAZOLAM HCL 2 MG/2ML IJ SOLN
INTRAMUSCULAR | Status: AC | PRN
  Administered 2014-07-01: 1 mg via INTRAVENOUS

## 2014-07-01 NOTE — Progress Notes (Addendum)
Gen. 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 He has not had any weight loss or change in his bowel habits. Has been eating normally.     He did have a colonoscopy 2 years ago and states that he had a villous adenoma of the cecum excised.     He was then admitted with more severe pain and a single episode of nonbilious on vomiting last night. CT scan shows small amount of ascites around the liver, omental caking, nodules, diffusely distended small bowel, and what looks like a mass of the cecum or ascending colon.    He is feeling better today. Not having any pain. He says he feels borderline distended. The cramps have gone. He says he can eat. Normal bowel movement yesterday. Passing flatus today. Still NPO.  ExamL:  reveals that his abdomen is soft. Subjective tenderness left lower quadrant but without guarding or mass. Bowel sounds present. Not really tympanitic but perhaps borderline distended. No hernias noted.     I discussed the CT scan findings with him. He is well aware of their implications because of his medical background. Cancer of the cecum with malignant obstruction versus motility disorder, pancreatic or gastric cancer are possible.    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  Most likely primary would be cancer of the cecum, gastric cancer, pancreatic cancer. He may have a malignant obstruction in the ileocecal area. It is also possible this may simply be a motility disorder. Slightly dilated CBD, unknown significance. There is no indication for surgical intervention at this point in time, so we will pursue a diagnostic workup     Plan: CEA and CA-19-9 ordered Case discussed with Dr. Julieanne Manson who will see him tomorrow CT-guided omental biopsy. There is not accessible fluid for paracentesis. Discussed with Dr.  Kathlene Cote Dr. [name] to see to consider upper endoscopy and colonoscopy to look for primary source.  I will cancel his cholecystectomy for next week Care plan discussed with Dr. Francene Boyers. I will be unavailable for the next 4 days. Dr. Donnie Mesa  will follow from the surgical standpoint    Wesley Dillon. [name], M.D., [facility] General and Minimally invasive Surgery Breast and Colorectal Surgery Office:   [phone] Pager:   [phone]

## 2014-07-01 NOTE — Progress Notes (Signed)
Utilization review completed. Kymani Shimabukuro, RN, BSN. 

## 2014-07-01 NOTE — Procedures (Signed)
CT bx R omenatal disease 18g x4 .No complication No blood loss. See complete dictation in [facility].

## 2014-07-01 NOTE — Consult Note (Signed)
Reason for Consult:PSBO, abnormality on abdominal CT scan  Referring Physician: [name] is an 69 y.o. male.  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 He underwent a GI workup including upper endoscopy by Dr. [name]. He was diagnosed with some mild gastritis and started on PPI. The pain persisted and Dr. [name] send him for abdominal ultrasound. This demonstrated cholelithiasis. He was evaluated by Dr. [name] from our practice and scheduled for laparoscopic cholecystectomy next week. In the antrum, he has gradually had more abdominal distention and intermittent pain. Today, the pain was much worse. He came to the emergency department where he had a large episode of vomiting. 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  Past Medical History  Diagnosis Date  . Tremor     takes Primidone 250 once daily and inderall 160 daily for this-has had it since he was 21  . Pneumonia 06/2012  . Macular degeneration   . Benign essential tremor   . Coronary artery disease   . Anemia 06/30/2014    Past Surgical History  Procedure Laterality Date  . Tonsillectomy    . Video bronchoscopy Bilateral 04/29/2013    Procedure: VIDEO BRONCHOSCOPY WITHOUT FLUORO;  Surgeon: Wesley Gobble, MD;  Location: [facility];  Service: Cardiopulmonary;  Laterality: Bilateral;    Family History  Problem Relation Age of Onset  . CAD Father 104    Died MI  . Atrial fibrillation Father   . Asthma Father   . Alzheimer's disease Mother   . Diabetes Mother   . Heart disease Mother   . Atrial fibrillation Mother   . Seizures Son     Died status epilepticus    Social History:  reports that he has never smoked. He has never used smokeless tobacco. He reports that he does not drink alcohol or use illicit drugs.  Allergies: No Known Allergies  Medications:  Prior  to Admission:  Prescriptions prior to admission  Medication Sig Dispense Refill Last Dose  . atorvastatin (LIPITOR) 10 MG tablet Take 10 mg by mouth daily.   06/30/2014 at Unknown time  . ipratropium (ATROVENT) 0.06 % nasal spray Place 2 sprays into both nostrils every 6 (six) hours as needed for rhinitis. 15 mL 2 Past Week at Unknown time  . losartan (COZAAR) 25 MG tablet Take 1 tablet (25 mg total) by mouth daily. 90 tablet 3 06/30/2014 at Unknown time  . Multiple Vitamins-Minerals (PRESERVISION AREDS PO) Take 2 capsules by mouth daily.    06/30/2014 at Unknown time  . primidone (MYSOLINE) 250 MG tablet Take 1 tablet (250 mg total) by mouth at bedtime. 90 tablet 3 06/30/2014 at Unknown time  . propranolol ER (INDERAL LA) 80 MG 24 hr capsule Take 1 capsule (80 mg total) by mouth daily. 90 capsule 3 06/30/2014 at 0700  . azithromycin (ZITHROMAX) 500 MG tablet Take 1 tablet (500 mg total) by mouth 3 (three) times a week. (Patient not taking: Reported on 06/30/2014) 45 tablet 3 Not Taking at Unknown time  . ethambutol (MYAMBUTOL) 400 MG tablet Take 5 tablets (2,000 mg total) by mouth 3 (three) times a week. (Patient not taking: Reported on 06/30/2014) 180 tablet 3 Not Taking at Unknown time  . rifabutin (MYCOBUTIN) 150 MG capsule Take 2 capsules (300 mg total) by mouth 3 (three) times a week. (Patient not taking: Reported on 06/30/2014) 72  capsule 3 Not Taking at Unknown time    Results for orders placed or performed during the hospital encounter of 06/30/14 (from the past 48 hour(s))  Comprehensive metabolic panel     Status: Abnormal   Collection Time: 06/30/14  7:40 PM  Result Value Ref Range   Sodium 137 135 - 145 mmol/L   Potassium 4.5 3.5 - 5.1 mmol/L   Chloride 102 96 - 112 mmol/L   CO2 27 19 - 32 mmol/L   Glucose, Bld 124 (H) 70 - 99 mg/dL   BUN 17 6 - 23 mg/dL   Creatinine, Ser 1.00 0.50 - 1.35 mg/dL   Calcium 9.8 8.4 - 10.5 mg/dL   Total Protein 7.6 6.0 - 8.3 g/dL   Albumin 4.3 3.5 - 5.2 g/dL    AST 28 0 - 37 U/L   ALT 22 0 - 53 U/L   Alkaline Phosphatase 69 39 - 117 U/L   Total Bilirubin 0.5 0.3 - 1.2 mg/dL   GFR calc non Af Amer 76 (L) >90 mL/min   GFR calc Af Amer 88 (L) >90 mL/min    Comment: (NOTE) The eGFR has been calculated using the CKD EPI equation. This calculation has not been validated in all clinical situations. eGFR's persistently <90 mL/min signify possible Chronic Kidney Disease.    Anion gap 8 5 - 15  Lipase, blood     Status: None   Collection Time: 06/30/14  7:40 PM  Result Value Ref Range   Lipase 39 11 - 59 U/L  CBC with Differential     Status: Abnormal   Collection Time: 06/30/14  7:40 PM  Result Value Ref Range   WBC 10.8 (H) 4.0 - 10.5 K/uL   RBC 3.98 (L) 4.22 - 5.81 MIL/uL   Hemoglobin 11.6 (L) 13.0 - 17.0 g/dL   HCT 35.9 (L) 39.0 - 52.0 %   MCV 90.2 78.0 - 100.0 fL   MCH 29.1 26.0 - 34.0 pg   MCHC 32.3 30.0 - 36.0 g/dL   RDW 12.5 11.5 - 15.5 %   Platelets 395 150 - 400 K/uL   Neutrophils Relative % 75 43 - 77 %   Neutro Abs 8.1 (H) 1.7 - 7.7 K/uL   Lymphocytes Relative 15 12 - 46 %   Lymphs Abs 1.7 0.7 - 4.0 K/uL   Monocytes Relative 8 3 - 12 %   Monocytes Absolute 0.8 0.1 - 1.0 K/uL   Eosinophils Relative 2 0 - 5 %   Eosinophils Absolute 0.2 0.0 - 0.7 K/uL   Basophils Relative 0 0 - 1 %   Basophils Absolute 0.0 0.0 - 0.1 K/uL  CBG monitoring, ED     Status: Abnormal   Collection Time: 06/30/14  7:45 PM  Result Value Ref Range   Glucose-Capillary 152 (H) 70 - 99 mg/dL  I-Stat CG4 Lactic Acid, ED     Status: None   Collection Time: 06/30/14  8:51 PM  Result Value Ref Range   Lactic Acid, Venous 1.37 0.5 - 2.0 mmol/L  I-Stat CG4 Lactic Acid, ED     Status: None   Collection Time: 06/30/14 11:02 PM  Result Value Ref Range   Lactic Acid, Venous 0.58 0.5 - 2.0 mmol/L  Glucose, capillary     Status: Abnormal   Collection Time: 07/01/14 12:18 AM  Result Value Ref Range   Glucose-Capillary 114 (H) 70 - 99 mg/dL    Ct Abdomen Pelvis  W Contrast  06/30/2014   CLINICAL DATA:  Abdominal pain this evening. Intermittent pain for 3 months. Syncopal episode.  EXAM: CT ABDOMEN AND PELVIS WITH CONTRAST  TECHNIQUE: Multidetector CT imaging of the abdomen and pelvis was performed using the standard protocol following bolus administration of intravenous contrast.  CONTRAST:  193m OMNIPAQUE IOHEXOL 300 MG/ML  SOLN  COMPARISON:  Ultrasound abdomen 05/26/2014  FINDINGS: Lung bases demonstrate evidence of bronchiectasis. No focal infiltration. Ascending thoracic aorta appears dilated although incompletely included on the study.  The gallbladder is mildly distended. Stones seen at ultrasound are not definitely demonstrated on CT, likely non radiopaque. There is mild intra and extrahepatic bile duct dilatation of uncertain significance. No common duct stone is identified but given the stones are not radiopaque, cannot be excluded. No focal liver lesions identified. The pancreas, spleen, adrenal glands, abdominal aorta, inferior vena cava, and retroperitoneal lymph nodes are unremarkable. Mild calcification of the aorta. There is moderate free fluid, likely ascites around the liver and extending along the right pericolic gutter into the pelvis. Diffuse omental and peritoneal nodularity most prominent in the right upper quadrant. Appearance is worrisome for peritoneal carcinomatosis. Small esophageal hiatal hernia. Stomach appears otherwise normal. There is mild dilatation of fluid-filled small bowel down to the level of the mid/distal ileum where there is a transition zone to decompressed bowel in the right lower quadrant. This suggests a partial small bowel obstruction. Cause of obstruction is not demonstrated. Colon is mostly decompressed. Small cyst in the left kidney. No solid mass or hydronephrosis in either kidney. No free air in the abdomen.  Pelvis: Prostate gland is mildly enlarged. Bladder wall is not thickened. Free fluid in the pelvis extending down  from the abdomen. Appendix is segmentally visualized and appears normal. No pelvic mass or lymphadenopathy. Mild degenerative changes in the spine. No destructive bone lesions.  IMPRESSION: Dilatation of fluid-filled small bowel with transition zone in the right lower quadrant consistent with partial obstruction. Mild to moderate right abdominal and pelvic ascites. 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 Distended gallbladder with mild bile duct dilatation. Stones seen a previous ultrasound are not visualized and are likely non radiopaque. Small esophageal hiatal hernia. Bronchiectasis in the lung bases.   Electronically Signed   By: Wesley CapersM.D.   On: 06/30/2014 22:11    Review of Systems  Constitutional: Negative for fever, chills and weight loss.  Respiratory: Negative for cough and hemoptysis.   Cardiovascular: Negative for chest pain and palpitations.  Gastrointestinal: Positive for nausea, vomiting and abdominal pain.       Heme positive stool  Genitourinary: Negative.   Musculoskeletal: Negative.   Neurological: Positive for tremors.  Endo/Heme/Allergies: Negative.   Psychiatric/Behavioral: Negative.    Blood pressure 119/69, pulse 62, temperature 97.9 F (36.6 C), temperature source Oral, resp. rate 18, height 6' 1"  (1.854 m), weight 190 lb 0.6 oz (86.2 kg), SpO2 99 %. Physical Exam  Constitutional: He is oriented to person, place, and time. He appears well-developed and well-nourished. No distress.  HENT:  Head: Normocephalic and atraumatic.  Right Ear: External ear normal.  Left Ear: External ear normal.  Nose: Nose normal.  Mouth/Throat: Oropharynx is clear and moist. No oropharyngeal exudate.  Eyes: EOM are normal. Pupils are equal, round, and reactive to light. Right eye exhibits no discharge. Left eye exhibits no discharge. No scleral icterus.  Neck: Normal range of motion. No tracheal deviation present. No thyromegaly present.   Cardiovascular: Normal rate, regular rhythm, normal heart sounds and intact distal pulses.  Respiratory: Effort normal and breath sounds normal. No stridor. No respiratory distress. He has no wheezes. He has no rales.  GI: Soft. He exhibits distension. He exhibits no mass. There is no tenderness. There is no rebound and no guarding.  No hernias, hypoactive bowel sounds, minimal fluid wave  Musculoskeletal: Normal range of motion.  Lymphadenopathy:    He has no cervical adenopathy.  Neurological: He is alert and oriented to person, place, and time. He exhibits normal muscle tone.  Skin: Skin is warm and dry.  Psychiatric: He has a normal mood and affect.    Assessment/Plan: PSBO, multiple omental nodules. No previous surgeries. Agree with medical admit. May benefit from exploratory laparoscopy, possible exploratory laparotomy to further evaluate. Please keep NPO. I will D/W Dr. Georgette Dover and Dr. [name]. If vomits again would place NGT to LIWS. I discussed the plan with him and his wife.  Madylin Fairbank E 07/01/2014, 12:29 AM

## 2014-07-01 NOTE — Progress Notes (Signed)
[facility] Progress Note   [name] MEQ:[id number] DOB: [birth year] DOA: 06/30/2014 PCP: Gerrit Heck, MD  Brief narrative: Wesley Dillon is a 69 y.o. male with h/o CAD/ MAC presenting with sharp acute abdominal pain yesterday. He has been having on and off pain for a few months and surgery was going to perform a lap chole next week. 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    Subjective: Had some recurrence of sharp abdominal pain after biopsy.   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 SBO (small bowel obstruction) - intermittent obstruction due to mets? - has good bowel sound currently on exam- will start clears  HTN - on Losartan and Inderal   Code Status: full code Family Communication:  Disposition Plan:  DVT prophylaxis: SCDs Consultants:surgery, oncology, GI Procedures: omental bx   Antibiotics: Anti-infectives    None      Objective: Filed Weights   07/01/14 0022  Weight: 86.2 kg (190 lb 0.6 oz)    Intake/Output Summary (Last 24 hours) at 07/01/14 1358 Last data filed at 07/01/14 0100  Gross per 24 hour  Intake   1000 ml  Output      0 ml  Net   1000 ml     Vitals Filed Vitals:   07/01/14 1220 07/01/14 1245 07/01/14 1248 07/01/14 1257  BP: 110/73 115/63 103/58 106/61  Pulse: 55 53 54 52  Temp:      TempSrc:      Resp: 18 12 10 10   Height:      Weight:      SpO2: 100% 100% 100% 97%    Exam:  General:  Pt is alert, not in acute distress  HEENT: No icterus, No thrush  Cardiovascular: regular rate and rhythm, S1/S2 No murmur  Respiratory: clear to auscultation bilaterally   Abdomen: Soft, +Bowel sounds, non tender, non  distended, no guarding  MSK: No LE edema, cyanosis or clubbing  Data Reviewed: Basic Metabolic Panel:  Recent Labs Lab 06/30/14 1940 07/01/14 0104 07/01/14 1020  NA 137  --  138  K 4.5  --  4.3  CL 102  --  108  CO2 27  --  27  GLUCOSE 124*  --  118*  BUN 17  --  15  CREATININE 1.00 0.92 0.92  CALCIUM 9.8  --  8.2*   Liver Function Tests:  Recent Labs Lab 06/30/14 1940 07/01/14 1020  AST 28 23  ALT 22 19  ALKPHOS 69 56  BILITOT 0.5 0.5  PROT 7.6 5.8*  ALBUMIN 4.3 3.3*    Recent Labs Lab 06/30/14 1940  LIPASE 39   No results for input(s): AMMONIA in the last 168 hours. CBC:  Recent Labs Lab 06/30/14 1940 07/01/14 0104 07/01/14 1020  WBC 10.8* 9.5 7.6  NEUTROABS 8.1*  --   --   HGB 11.6* 10.1* 9.5*  HCT 35.9* 31.0* 30.1*  MCV 90.2 90.6 91.5  PLT 395 299 285   Cardiac Enzymes: No results for input(s): CKTOTAL, CKMB, CKMBINDEX, TROPONINI in the last 168 hours. BNP (last 3 results) No results for input(s): BNP in the last 8760 hours.  ProBNP (last 3 results) No  results for input(s): PROBNP in the last 8760 hours.  CBG:  Recent Labs Lab 06/30/14 1945 07/01/14 0018 07/01/14 0745  GLUCAP 152* 114* 111*    No results found for this or any previous visit (from the past 240 hour(s)).   Studies:  Recent x-ray studies have been reviewed in detail by the Attending Physician  Scheduled Meds:  Scheduled Meds: . atorvastatin  10 mg Oral Daily  . fentaNYL      . folic acid  1 mg Oral Daily  . ipratropium  2 spray Each Nare TID  . lidocaine      . losartan  25 mg Oral Daily  . midazolam      . primidone  250 mg Oral QHS  . propranolol ER  80 mg Oral Daily  . thiamine  100 mg Oral Daily   Continuous Infusions: . dextrose 5 % and 0.9% NaCl 50 mL/hr at 07/01/14 0032    Time spent on care of this patient: 3 min   Ama, MD 07/01/2014, 1:58 PM  LOS: 1 day   [facility] Office  [phone] Pager - Text Page per  [email or web address]  If 7PM-7AM, please contact night-coverage [email or web address]

## 2014-07-01 NOTE — Progress Notes (Signed)
New Admission Note:   Arrival: via stretcher from ED with tech Mental Orientation: A&Ox4 Telemetry: none ordered Assessment:  See doc flowsheet Skin: intact, redness on bottom but blancheable IV: right AC IV Pain: none currently Safety Measures:  Call bell placed within reach; patient instructed on use of call bell and verbalized understanding. Bed in lowest position.  Yellow bracelet on.  Non-skid socks refused.   [address] [place] Orientation: Patient oriented to staff, room, and unit. Family: wife at bedside  Orders have been reviewed and implemented. Will continue to monitor.  Arlyss Queen, RN, BSN

## 2014-07-01 NOTE — Sedation Documentation (Signed)
Patient denies pain and is resting comfortably.  

## 2014-07-01 NOTE — Consult Note (Signed)
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  They had been mild, not interfering with work, maintaining weight, yet persistent.  Physical exam was negative and labs all normal.  EGD was performed 05/07/2014 with findings of non-erosive, H.pylori negative gastritis and duodenitis.  He was started on omeprazole which has offered no relief.  Stools have remained heme positive on 2 of 3 specimens.  Abdominal u/s showed cholelithiasis and borderline CBD dilation.  Surgical outpatient consultation with Dr. [name] was completed and elective cholecystectomy scheduled.  Yesterday evening he developed severe diffuse abdominal pain for which he was evaluated in ED.  He vomited for the first time and had a syncopal episode.  Abdominal CT was obtained after u/s revealed peri-hepatic ascites.  This revealed omental nodularity, small bowel obstruction in the mid-distal ileum, ascites, and intra/extra-hepatic biliary dilatation.  He underwent CT directed omental biopsy this afternoon.  He was kept NPO until after the procedure when he had a piece of cake with recurrence of his abdominal pain for which he is receiving narcotic analgesia.  Two small bowel movements yesterday, none today and no flatus.    EGD and Colonoscopy are being considered to diagnose primary site of probable carcinomatosis.  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  Exam: General:  Alert and fully oriented.  Resting comfortably.  VSS. HEENT:  Anicteric.  No pallor. Abdomen:  Mild distention.  Diffuse mild tenderness without rebound.  No appreciable organomegaly or mass.  Dull tympany, no fluid wave detectable.  Assessment:   The severe episodic/RUQ pain with nausea and vomiting, in the setting of known cholelithiasis and biliary dilatation remains worrisome for intermittent biliary obstruction.    Malignancy is suggested by the ascites and omental nodularity but would be atypical in not causing anorexia with weight loss over the 5 month duration of his illness.  Also the lack of adenopathy and hepatic involvement would be unusual.  He had high quality colonoscopy and EGD in the relatively recent past further reducing the likelihood of a colonic or gastric source.  The level of obstruction was in the distal small bowel raising the possibility of a carcinoid tumor.  His recent chronic atypical mycobacteria infection could theoretically cause a granulomatous inflammation mimicking a neoplastic process.  Hopefully the omental biopsy will help sort out the differential diagnosis.  Recommendation: -Await path results. -EGD/colonoscopy are risky if there is SBO.  Prepping would be problematic and aspiration risk high.  Diagnostic yield I suspect would be low. -Consider MR enterogram to further evaluate possible ileal lesion causing SBO. -Monitor hepatobiliary status with labs and repeat u/s if indicated.

## 2014-07-01 NOTE — H&P (Signed)
Reason for Consult: Omental nodules Chief Complaint: Chief Complaint  Patient presents with  . Abdominal Pain    Referring Physician(s): CCS  History of Present Illness: Wesley Dillon is a 69 y.o. male who presented to ED 3/8 with c/o abdominal pain and vomiting. CT revealed partial SBO and diffuse omental nodules and ascites. IR received request for image guided biopsy of omental nodules. The patient denies any chest pain, shortness of breath or palpitations. He denies any active signs of bleeding or excessive bruising. He denies any recent fever or chills. The patient denies any history of sleep apnea or chronic oxygen use. He has previously tolerated sedation without complications.    Past Medical History  Diagnosis Date  . Tremor     takes Primidone 250 once daily and inderall 160 daily for this-has had it since he was 21  . Pneumonia 06/2012  . Macular degeneration   . Benign essential tremor   . Coronary artery disease   . Anemia 06/30/2014    Past Surgical History  Procedure Laterality Date  . Tonsillectomy    . Video bronchoscopy Bilateral 04/29/2013    Procedure: VIDEO BRONCHOSCOPY WITHOUT FLUORO;  Surgeon: Collene Gobble, MD;  Location: [facility];  Service: Cardiopulmonary;  Laterality: Bilateral;    Allergies: Review of patient's allergies indicates no known allergies.  Medications: Prior to Admission medications   Medication Sig Start Date End Date Taking? Authorizing Provider  atorvastatin (LIPITOR) 10 MG tablet Take 10 mg by mouth daily.   Yes Historical Provider, MD  ipratropium (ATROVENT) 0.06 % nasal spray Place 2 sprays into both nostrils every 6 (six) hours as needed for rhinitis. 11/03/13  Yes Tammy S Parrett, NP  losartan (COZAAR) 25 MG tablet Take 1 tablet (25 mg total) by mouth daily. 09/17/13  Yes Sherren Mocha, MD  Multiple Vitamins-Minerals (PRESERVISION AREDS PO) Take 2 capsules by mouth daily.    Yes Historical Provider, MD  primidone  (MYSOLINE) 250 MG tablet Take 1 tablet (250 mg total) by mouth at bedtime. 10/21/13  Yes Carmen Dohmeier, MD  propranolol ER (INDERAL LA) 80 MG 24 hr capsule Take 1 capsule (80 mg total) by mouth daily. 10/21/13  Yes Carmen Dohmeier, MD  azithromycin (ZITHROMAX) 500 MG tablet Take 1 tablet (500 mg total) by mouth 3 (three) times a week. Patient not taking: Reported on 06/30/2014 09/16/13   Collene Gobble, MD  ethambutol (MYAMBUTOL) 400 MG tablet Take 5 tablets (2,000 mg total) by mouth 3 (three) times a week. Patient not taking: Reported on 06/30/2014 09/16/13   Collene Gobble, MD  rifabutin (MYCOBUTIN) 150 MG capsule Take 2 capsules (300 mg total) by mouth 3 (three) times a week. Patient not taking: Reported on 06/30/2014 09/16/13   Collene Gobble, MD     Family History  Problem Relation Age of Onset  . CAD Father 11    Died MI  . Atrial fibrillation Father   . Asthma Father   . Alzheimer's disease Mother   . Diabetes Mother   . Heart disease Mother   . Atrial fibrillation Mother   . Seizures Son     Died status epilepticus    History   Social History  . Marital Status: Married    Spouse Name: [name]  . Number of Children: 2  . Years of Education: Med. Sch.   Occupational History  . PHYSICIAN [employer]   Social History Main Topics  . Smoking status: Never Smoker   .  Smokeless tobacco: Never Used  . Alcohol Use: No     Comment: wine with dinner  . Drug Use: No  . Sexual Activity: Not on file   Other Topics Concern  . None   Social History Narrative   Patient is married [name]) and lives at home with his wife.   Patient has two adult children.   Patient has a college education.   Patient is right-handed.   Patient drinks four cups of coffee daily.   Lives in [place]   Physician at [facility]   Pneumococcal vaccine pending             Review of Systems: A 12 point ROS discussed and pertinent positives are indicated in the HPI above.  All other systems are negative.  Review of  Systems  Vital Signs: BP 102/53 mmHg  Pulse 53  Temp(Src) 97.8 F (36.6 C) (Oral)  Resp 18  Ht 6\' 1"  (1.854 m)  Wt 190 lb 0.6 oz (86.2 kg)  BMI 25.08 kg/m2  SpO2 95%  Physical Exam  Constitutional: He is oriented to person, place, and time. No distress.  HENT:  Head: Normocephalic and atraumatic.  Neck: No tracheal deviation present.  Cardiovascular: Normal rate and regular rhythm.  Exam reveals no gallop and no friction rub.   No murmur heard. Pulmonary/Chest: Effort normal and breath sounds normal. No respiratory distress. He has no wheezes. He has no rales.  Abdominal: Soft. Bowel sounds are normal. He exhibits no distension. There is no tenderness.  Neurological: He is alert and oriented to person, place, and time.  Skin: He is not diaphoretic.    Mallampati Score:  MD Evaluation Airway: WNL Heart: WNL Abdomen: WNL Chest/ Lungs: WNL ASA  Classification: 2 Mallampati/Airway Score: Two  Imaging: Ct Abdomen Pelvis W Contrast  06/30/2014   CLINICAL DATA:  Abdominal pain this evening. Intermittent pain for 3 months. Syncopal episode.  EXAM: CT ABDOMEN AND PELVIS WITH CONTRAST  TECHNIQUE: Multidetector CT imaging of the abdomen and pelvis was performed using the standard protocol following bolus administration of intravenous contrast.  CONTRAST:  146mL OMNIPAQUE IOHEXOL 300 MG/ML  SOLN  COMPARISON:  Ultrasound abdomen 05/26/2014  FINDINGS: Lung bases demonstrate evidence of bronchiectasis. No focal infiltration. Ascending thoracic aorta appears dilated although incompletely included on the study.  The gallbladder is mildly distended. Stones seen at ultrasound are not definitely demonstrated on CT, likely non radiopaque. There is mild intra and extrahepatic bile duct dilatation of uncertain significance. No common duct stone is identified but given the stones are not radiopaque, cannot be excluded. No focal liver lesions identified. The pancreas, spleen, adrenal glands, abdominal  aorta, inferior vena cava, and retroperitoneal lymph nodes are unremarkable. Mild calcification of the aorta. There is moderate free fluid, likely ascites around the liver and extending along the right pericolic gutter into the pelvis. Diffuse omental and peritoneal nodularity most prominent in the right upper quadrant. Appearance is worrisome for peritoneal carcinomatosis. Small esophageal hiatal hernia. Stomach appears otherwise normal. There is mild dilatation of fluid-filled small bowel down to the level of the mid/distal ileum where there is a transition zone to decompressed bowel in the right lower quadrant. This suggests a partial small bowel obstruction. Cause of obstruction is not demonstrated. Colon is mostly decompressed. Small cyst in the left kidney. No solid mass or hydronephrosis in either kidney. No free air in the abdomen.  Pelvis: Prostate gland is mildly enlarged. Bladder wall is not thickened. Free fluid in the pelvis extending  down from the abdomen. Appendix is segmentally visualized and appears normal. No pelvic mass or lymphadenopathy. Mild degenerative changes in the spine. No destructive bone lesions.  IMPRESSION: Dilatation of fluid-filled small bowel with transition zone in the right lower quadrant consistent with partial obstruction. Mild to moderate right abdominal and pelvic ascites. 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 Distended gallbladder with mild bile duct dilatation. Stones seen a previous ultrasound are not visualized and are likely non radiopaque. Small esophageal hiatal hernia. Bronchiectasis in the lung bases.   Electronically Signed   By: Lucienne Capers M.D.   On: 06/30/2014 22:11    Labs:  CBC:  Recent Labs  06/30/14 1940 07/01/14 0104  WBC 10.8* 9.5  HGB 11.6* 10.1*  HCT 35.9* 31.0*  PLT 395 299    COAGS: No results for input(s): INR, APTT in the last 8760 hours.  BMP:  Recent Labs  06/30/14 1940  07/01/14 0104  NA 137  --   K 4.5  --   CL 102  --   CO2 27  --   GLUCOSE 124*  --   BUN 17  --   CALCIUM 9.8  --   CREATININE 1.00 0.92  GFRNONAA 76* 85*  GFRAA 88* >90    LIVER FUNCTION TESTS:  Recent Labs  06/30/14 1940  BILITOT 0.5  AST 28  ALT 22  ALKPHOS 69  PROT 7.6  ALBUMIN 4.3   Assessment and Plan: Cholelithiasis seen by CCS and scheduled for elective Lap cholecystectomy next week Vomiting, PSBO- last episode 3/8 in ED Abdominal pain, CT revealed ascites and omental nodules Request for biopsy The patient has been NPO, no blood thinners taken, labs and vitals have been reviewed, will proceed with CT guided omental mass biopsy today with moderate sedation. Risks and Benefits discussed with the patient including, but not limited to bleeding, infection, damage to adjacent structures or low yield requiring additional tests. All of the patient's questions were answered, patient is agreeable to proceed. Consent signed and in chart. History of AAA being monitored. History of MAC s/p treatment   Thank you for this interesting consult.  I greatly enjoyed meeting KACETON VIEAU and look forward to participating in their care.  SignedHedy Jacob 07/01/2014, 10:37 AM   I spent a total of 20 Minutes in face to face in clinical consultation, greater than 50% of which was counseling/coordinating care for omental nodules.

## 2014-07-02 ENCOUNTER — Inpatient Hospital Stay (HOSPITAL_COMMUNITY): Admission: RE | Admit: 2014-07-02 | Payer: 59 | Source: Ambulatory Visit

## 2014-07-02 DIAGNOSIS — D509 Iron deficiency anemia, unspecified: Secondary | ICD-10-CM

## 2014-07-02 DIAGNOSIS — J479 Bronchiectasis, uncomplicated: Secondary | ICD-10-CM

## 2014-07-02 DIAGNOSIS — R109 Unspecified abdominal pain: Secondary | ICD-10-CM

## 2014-07-02 LAB — OCCULT BLOOD X 1 CARD TO LAB, STOOL: Fecal Occult Bld: POSITIVE — AB

## 2014-07-02 LAB — HEMOGLOBIN A1C
Hgb A1c MFr Bld: 5.9 % — ABNORMAL HIGH (ref 4.8–5.6)
Mean Plasma Glucose: 123 mg/dL

## 2014-07-02 LAB — GLUCOSE, CAPILLARY: GLUCOSE-CAPILLARY: 123 mg/dL — AB (ref 70–99)

## 2014-07-02 LAB — CEA: CEA: 3 ng/mL (ref 0.0–4.7)

## 2014-07-02 LAB — CANCER ANTIGEN 19-9: CA 19-9: 10 U/mL (ref 0–35)

## 2014-07-02 MED ORDER — ACETAMINOPHEN 325 MG PO TABS: 650.0000 mg | ORAL_TABLET | ORAL | Status: DC | PRN

## 2014-07-02 NOTE — Progress Notes (Signed)
[facility] Progress Note   Wesley Dillon:[id number] DOB: 1946-02-24 DOA: 06/30/2014 PCP: Gerrit Heck, MD  Brief narrative: Wesley Dillon is a 69 y.o. male with h/o CAD/ MAC presenting with sharp acute abdominal pain yesterday. He has been having on and off pain for a few months and surgery was going to perform a lap chole next week. 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    Subjective: Continues to have off and on abdominal pain- passing gas- no BM- trying clears- had intense pain after accidentally being brought a solid meal and eating cake. He wants to stay on clears today.  Assessment/Plan: Principal Problem:   Omental masses - s/p biopsy- Dr [name] has contacted oncology and GI  Who are following - CEA and CA 19-9 negative - await bx results  Active Problems:   Pulmonary nodules- treated for MAI in 2068for 9 mo-  h/o bronchiectasis - follows with Dr [name] as outpt- last visit 12/15- plans were to repeat CT in 10/16    Partial? SBO (small bowel obstruction) - intermittent obstruction due to mets? - continues to have good bowel sound on exam- cont  clears  HTN - on Losartan and Inderal   Code Status: full code Family Communication:  Disposition Plan:  DVT prophylaxis: SCDs Consultants:surgery, oncology, GI Procedures: omental bx   Antibiotics: Anti-infectives    None      Objective: Filed Weights   07/01/14 0022 07/01/14 2134  Weight: 86.2 kg (190 lb 0.6 oz) 86.578 kg (190 lb 13.9 oz)    Intake/Output Summary (Last 24 hours) at 07/02/14 1123 Last data filed at 07/02/14 0912  Gross per 24 hour  Intake   1595 ml  Output      0 ml  Net   1595 ml     Vitals Filed Vitals:   07/01/14 1728 07/01/14 2134 07/02/14 0513 07/02/14 0935  BP: 107/56 118/68 126/60 111/63  Pulse: 55 61 68 57  Temp: 97.5 F (36.4 C) 98.3 F (36.8 C) 98.1 F (36.7 C) 98.3 F (36.8 C)  TempSrc: Oral Oral Oral Oral  Resp: 15 16 16 16   Height:  6\' 1"   (1.854 m)    Weight:  86.578 kg (190 lb 13.9 oz)    SpO2: 100% 98% 98% 98%    Exam:  General:  Pt is alert, not in acute distress  HEENT: No icterus, No thrush  Cardiovascular: regular rate and rhythm, S1/S2 No murmur  Respiratory: clear to auscultation bilaterally   Abdomen: Soft, +Bowel sounds,mild diffuse tenderness, non distended, no guarding  MSK: No LE edema, cyanosis or clubbing  Data Reviewed: Basic Metabolic Panel:  Recent Labs Lab 06/30/14 1940 07/01/14 0104 07/01/14 1020  NA 137  --  138  K 4.5  --  4.3  CL 102  --  108  CO2 27  --  27  GLUCOSE 124*  --  118*  BUN 17  --  15  CREATININE 1.00 0.92 0.92  CALCIUM 9.8  --  8.2*   Liver Function Tests:  Recent Labs Lab 06/30/14 1940 07/01/14 1020  AST 28 23  ALT 22 19  ALKPHOS 69 56  BILITOT 0.5 0.5  PROT 7.6 5.8*  ALBUMIN 4.3 3.3*    Recent Labs Lab 06/30/14 1940  LIPASE 39   No results for input(s): AMMONIA in the last 168 hours. CBC:  Recent Labs Lab 06/30/14 1940 07/01/14 0104 07/01/14 1020  WBC 10.8* 9.5 7.6  NEUTROABS 8.1*  --   --  HGB 11.6* 10.1* 9.5*  HCT 35.9* 31.0* 30.1*  MCV 90.2 90.6 91.5  PLT 395 299 285   Cardiac Enzymes: No results for input(s): CKTOTAL, CKMB, CKMBINDEX, TROPONINI in the last 168 hours. BNP (last 3 results) No results for input(s): BNP in the last 8760 hours.  ProBNP (last 3 results) No results for input(s): PROBNP in the last 8760 hours.  CBG:  Recent Labs Lab 06/30/14 1945 07/01/14 0018 07/01/14 0745  GLUCAP 152* 114* 111*    No results found for this or any previous visit (from the past 240 hour(s)).   Studies:  Recent x-ray studies have been reviewed in detail by the Attending Physician  Scheduled Meds:  Scheduled Meds: . atorvastatin  10 mg Oral Daily  . folic acid  1 mg Oral Daily  . ipratropium  2 spray Each Nare TID  . losartan  25 mg Oral Daily  . primidone  250 mg Oral QHS  . propranolol ER  80 mg Oral Daily  .  thiamine  100 mg Oral Daily   Continuous Infusions: . dextrose 5 % and 0.9% NaCl 50 mL/hr at 07/01/14 2006    Time spent on care of this patient: 35 min   [place], MD 07/02/2014, 11:23 AM  LOS: 2 days   [facility] Office  [phone] Pager - Text Page per [email or web address]  If 7PM-7AM, please contact night-coverage [email or web address]

## 2014-07-02 NOTE — Progress Notes (Signed)
   07/02/14 1830  Clinical Encounter Type  Visited With Patient and family together;Health care provider  Visit Type Spiritual support  Spiritual Encounters  Spiritual Needs Grief support;Emotional;Prayer  Stress Factors  Family Stress Factors Loss  Chaplain responded to page requesting support for pt and family as they had received distressing news regarding pt's health and life expectancy. Chaplain spent about 40 minutes with pt and pt's wife, offering emotional and grief support. Chaplain normalized grief reactions and provided prayer. Chaplain will inform unit chaplain of case for follow-up. Page if needed.   Vanetta Mulders 07/02/2014 6:32 PM

## 2014-07-02 NOTE — Progress Notes (Signed)
[facility] Progress Note     Subjective: Pt says he's in a bit more abdominal pain today, mild nausea, no vomiting.  Ambulating well.  Urinating well, some flatus, no BM yet.  No new symptoms, no other concerns/questions.    Objective: Vital signs in last 24 hours: Temp:  [97.5 F (36.4 C)-98.3 F (36.8 C)] 98.3 F (36.8 C) (03/10 0935) Pulse Rate:  [52-68] 57 (03/10 0935) Resp:  [10-18] 16 (03/10 0935) BP: (103-126)/(56-73) 111/63 mmHg (03/10 0935) SpO2:  [97 %-100 %] 98 % (03/10 0935) Weight:  [190 lb 13.9 oz (86.578 kg)] 190 lb 13.9 oz (86.578 kg) (03/09 2134) Last BM Date: 06/30/14  Intake/Output from previous day: 03/09 0701 - 03/10 0700 In: 1908.3 [P.O.:440; I.V.:1468.3] Out: 0  Intake/Output this shift: Total I/O In: 360 [P.O.:360] Out: 0    PE: Gen:  Alert, NAD, pleasant Card:  RRR, no M/G/R heard Pulm:  CTA, no W/R/R Abd: Soft, tender in RUQ, ND, +BS, no HSM, no abdominal scars noted Ext:  No erythema, edema, or tenderness, DP pulses in tact   Lab Results:   Recent Labs  07/01/14 0104 07/01/14 1020  WBC 9.5 7.6  HGB 10.1* 9.5*  HCT 31.0* 30.1*  PLT 299 285   BMET  Recent Labs  06/30/14 1940 07/01/14 0104 07/01/14 1020  NA 137  --  138  K 4.5  --  4.3  CL 102  --  108  CO2 27  --  27  GLUCOSE 124*  --  118*  BUN 17  --  15  CREATININE 1.00 0.92 0.92  CALCIUM 9.8  --  8.2*   PT/INR No results for input(s): LABPROT, INR in the last 72 hours. CMP     Component Value Date/Time   NA 138 07/01/2014 1020   K 4.3 07/01/2014 1020   CL 108 07/01/2014 1020   CO2 27 07/01/2014 1020   GLUCOSE 118* 07/01/2014 1020   BUN 15 07/01/2014 1020   CREATININE 0.92 07/01/2014 1020   CALCIUM 8.2* 07/01/2014 1020   PROT 5.8* 07/01/2014 1020   ALBUMIN 3.3* 07/01/2014 1020   AST 23 07/01/2014 1020   ALT 19 07/01/2014 1020   ALKPHOS 56 07/01/2014 1020   BILITOT 0.5 07/01/2014 1020   GFRNONAA 85* 07/01/2014 1020   GFRAA >90 07/01/2014 1020    Lipase     Component Value Date/Time   LIPASE 39 06/30/2014 1940       Studies/Results: Ct Abdomen Pelvis W Contrast  06/30/2014   CLINICAL DATA:  Abdominal pain this evening. Intermittent pain for 3 months. Syncopal episode.  EXAM: CT ABDOMEN AND PELVIS WITH CONTRAST  TECHNIQUE: Multidetector CT imaging of the abdomen and pelvis was performed using the standard protocol following bolus administration of intravenous contrast.  CONTRAST:  149mL OMNIPAQUE IOHEXOL 300 MG/ML  SOLN  COMPARISON:  Ultrasound abdomen 05/26/2014  FINDINGS: Lung bases demonstrate evidence of bronchiectasis. No focal infiltration. Ascending thoracic aorta appears dilated although incompletely included on the study.  The gallbladder is mildly distended. Stones seen at ultrasound are not definitely demonstrated on CT, likely non radiopaque. There is mild intra and extrahepatic bile duct dilatation of uncertain significance. No common duct stone is identified but given the stones are not radiopaque, cannot be excluded. No focal liver lesions identified. The pancreas, spleen, adrenal glands, abdominal aorta, inferior vena cava, and retroperitoneal lymph nodes are unremarkable. Mild calcification of the aorta. There is moderate free fluid, likely ascites around the liver and extending  along the right pericolic gutter into the pelvis. Diffuse omental and peritoneal nodularity most prominent in the right upper quadrant. Appearance is worrisome for peritoneal carcinomatosis. Small esophageal hiatal hernia. Stomach appears otherwise normal. There is mild dilatation of fluid-filled small bowel down to the level of the mid/distal ileum where there is a transition zone to decompressed bowel in the right lower quadrant. This suggests a partial small bowel obstruction. Cause of obstruction is not demonstrated. Colon is mostly decompressed. Small cyst in the left kidney. No solid mass or hydronephrosis in either kidney. No free air in the  abdomen.  Pelvis: Prostate gland is mildly enlarged. Bladder wall is not thickened. Free fluid in the pelvis extending down from the abdomen. Appendix is segmentally visualized and appears normal. No pelvic mass or lymphadenopathy. Mild degenerative changes in the spine. No destructive bone lesions.  IMPRESSION: Dilatation of fluid-filled small bowel with transition zone in the right lower quadrant consistent with partial obstruction. Mild to moderate right abdominal and pelvic ascites. 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 Distended gallbladder with mild bile duct dilatation. Stones seen a previous ultrasound are not visualized and are likely non radiopaque. Small esophageal hiatal hernia. Bronchiectasis in the lung bases.   Electronically Signed   By: Lucienne Capers M.D.   On: 06/30/2014 22:11   Ct Biopsy  07/01/2014   CLINICAL DATA:  Bowel obstruction, ascites, nodular omental disease.  EXAM: CT GUIDED CORE BIOPSY OF OMENTUM  ANESTHESIA/SEDATION: Intravenous Fentanyl and Versed were administered as conscious sedation during continuous cardiorespiratory monitoring by the radiology RN, with a total moderate sedation time of 6 minutes.  PROCEDURE: The procedure risks, benefits, and alternatives were explained to the patient. Questions regarding the procedure were encouraged and answered. The patient understands and consents to the procedure.  Patient placed supine. Limited axial scans through the abdomen were obtained and an appropriate skin entry site was determined. Site was marked.  The operative field was prepped with Betadinein a sterile fashion, and a sterile drape was applied covering the operative field. A sterile gown and sterile gloves were used for the procedure. Local anesthesia was provided with 1% Lidocaine.  Under CT fluoroscopic guidance, a 17 gauge trocar needle was advanced to the margin of the lesion. Once needle tip position was confirmed, coaxial  18-gauge core biopsy samples were obtained, submitted in formalin to surgical pathology. The guide needle was removed. Postprocedure scans show no hemorrhage or other apparent complication.  COMPLICATIONS: None immediate  FINDINGS: Limited scans through the mid abdomen again demonstrate nodular omental disease and a small amount of scattered abdominal ascites. Multiple distended small bowel loops are identified. Oral contrast material is in the decompressed colon.  CT guided core biopsy of nodular omental disease was performed without complication.  IMPRESSION: 1. Technically successful CT-guided core biopsy of nodular omental disease.   Electronically Signed   By: Lucrezia Europe M.D.   On: 07/01/2014 14:50    Anti-infectives: Anti-infectives    None       Assessment/Plan Partial SBO ?carcinomatosis/malignant ascites Concern for cancer without diagnosis Multiple omental nodules -IR completed omental biopsy yesterday, GI following, Dr. Ammie Dalton saw patient today, pending note -Await biopsy results today -Dr. [name] is already his established general surgeon.  No acute surgical needs, may be able to address surgery as an outpatient as long as he doesn't stay obstructed -CA 19-9 10, CEA is 3.0 -Will follow -Tolerating clears well, advance diet to fulls as tolerated -Ambulate and IS -SCD's and  not currently on DVT pharm proph - would leave that to medicine team Gallstones -Was scheduled with Dr. [name] for lap chole next week   LOS: 2 days    DORT, Donyelle Enyeart 07/02/2014, 10:14 AM Pager: [phone]

## 2014-07-02 NOTE — Progress Notes (Signed)
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 He saw Dr. [name]. He was noted have a Hemoccult positive stool. An upper endoscopy 05/07/2014 revealed nonerosive and helical backer negative gastritis/duodenitis. His symptoms did not improve with Prilosec. He reports persistent Hemoccult positive stool. He was referred to Dr. [name] after an abdominal ultrasound revealed cholelithiasis and a borderline dilated common bile duct. He was being scheduled for surgery when he presented 06/30/2014 with more intense abdominal pain and vomiting. A CT of the abdomen and pelvis 06/30/2014 revealed fluid filled dilated small bowel with a transition sewn in the right lower quadrant. Mild to moderate abdominal/pelvic ascites. Diffuse omental and peritoneal nodularity suggesting carcinomatosis. The gallbladder was mildly distended with mild intra-and extrahepatic duct dilatation. No stones were seen.  He underwent a colonoscopy in July 2014. A tubular adenoma was removed from the cecum.  [name] underwent a CT-guided biopsy of the omentum on 07/01/2014 and the pathology is pending.     Past Medical History  Diagnosis Date  . Tremor     takes Primidone 250 once daily and inderall 160 daily for this-has had it since he was 21  . Pneumonia 06/2012  . Macular degeneration   . Benign essential tremor   . Coronary artery disease   . Anemia 06/30/2014  : . MAI pulmonary infection                                                                                    January 2015   . Bronchiectasis   Past Surgical History  Procedure Laterality Date  . Tonsillectomy    . Video bronchoscopy Bilateral 04/29/2013    Procedure: VIDEO BRONCHOSCOPY WITHOUT FLUORO;  Surgeon: Collene Gobble, MD;  Location: [facility];  Service:  Cardiopulmonary;  Laterality: Bilateral;  :   Current facility-administered medications:  .  acetaminophen (TYLENOL) tablet 650 mg, 650 mg, Oral, Q4H PRN, Debbe Odea, MD .  atorvastatin (LIPITOR) tablet 10 mg, 10 mg, Oral, Daily, Allyne Gee, MD, 10 mg at 07/02/14 1001 .  dextrose 5 %-0.9 % sodium chloride infusion, , Intravenous, Continuous, Allyne Gee, MD, Last Rate: 50 mL/hr at 07/01/14 2006 .  folic acid (FOLVITE) tablet 1 mg, 1 mg, Oral, Daily, Allyne Gee, MD, 1 mg at 07/02/14 1001 .  HYDROcodone-acetaminophen (NORCO/VICODIN) 5-325 MG per tablet 1-2 tablet, 1-2 tablet, Oral, Q4H PRN, Arne Cleveland, MD .  ipratropium (ATROVENT) 0.06 % nasal spray 2 spray, 2 spray, Each Nare, TID, Allyne Gee, MD, 2 spray at 07/01/14 1110 .  losartan (COZAAR) tablet 25 mg, 25 mg, Oral, Daily, Allyne Gee, MD, 25 mg at 07/02/14 1001 .  morphine 2 MG/ML injection 2 mg, 2 mg, Intravenous, Q3H PRN, Allyne Gee, MD, 2 mg at 07/01/14 1317 .  ondansetron (ZOFRAN) tablet 4 mg, 4 mg, Oral, Q6H PRN **OR** ondansetron (ZOFRAN) injection 4 mg, 4 mg, Intravenous, Q6H PRN, Allyne Gee, MD, 4 mg at 07/01/14 1317 .  primidone (MYSOLINE) tablet  250 mg, 250 mg, Oral, QHS, Allyne Gee, MD, 250 mg at 07/01/14 2115 .  propranolol ER (INDERAL LA) 24 hr capsule 80 mg, 80 mg, Oral, Daily, Allyne Gee, MD, 80 mg at 07/01/14 0945 .  thiamine (VITAMIN B-1) tablet 100 mg, 100 mg, Oral, Daily, Allyne Gee, MD, 100 mg at 07/02/14 1001:  . atorvastatin  10 mg Oral Daily  . folic acid  1 mg Oral Daily  . ipratropium  2 spray Each Nare TID  . losartan  25 mg Oral Daily  . primidone  250 mg Oral QHS  . propranolol ER  80 mg Oral Daily  . thiamine  100 mg Oral Daily  :  No Known Allergies:  Family History  Problem Relation Age of Onset  . CAD Father 26    Died MI  . Atrial fibrillation Father   . Asthma Father   . Alzheimer's disease Mother   . Diabetes Mother   . Heart disease Mother   . Atrial  fibrillation Mother   . Seizures Son     Died status epilepticus  :  History   Social History  . Marital Status: Married    Spouse Name: [name]  . Number of Children: 2  . Years of Education: Med. Sch.   Occupational History  . PHYSICIAN [employer]   Social History Main Topics  . Smoking status: Never Smoker   . Smokeless tobacco: Never Used  . Alcohol Use: No     Comment: wine with dinner  . Drug Use: No  . Sexual Activity: Not on file   Other Topics Concern  . Not on file   Social History Narrative   Patient is married [name]) and lives at home with his wife.   Patient has two adult children.   Patient has a college education.   Patient is right-handed.   Patient drinks four cups of coffee daily.   Lives in [place]   Physician at [facility]   Pneumococcal vaccine pending           :  Review of Systems:  Positives include: Abdominal pain for the past 3 months-worse for the past several days, nausea  A complete ROS was otherwise negative.   Physical Exam:  Blood pressure 111/63, pulse 57, temperature 98.3 F (36.8 C), temperature source Oral, resp. rate 16, height 6\' 1"  (1.854 m), weight 190 lb 13.9 oz (86.578 kg), SpO2 98 %.  HEENT: Neck without mass Lungs: Distant breath sounds, no respiratory distress Cardiac: Regular rate and rhythm Abdomen: Mildly distended, diffuse tenderness, no mass, no apparent ascites, no hepatosplenomegaly GU: Testes without mass  Vascular: No leg edema Lymph nodes: No cervical, supraclavicular, axillary, or inguinal nodes Neurologic: Alert and oriented, the motor exam appears grossly intact Skin: No rash Musculoskeletal: No spine tenderness  LABS:   Recent Labs  07/01/14 0104 07/01/14 1020  WBC 9.5 7.6  HGB 10.1* 9.5*  HCT 31.0* 30.1*  PLT 299 285     Recent Labs  06/30/14 1940 07/01/14 0104 07/01/14 1020  NA 137  --  138  K 4.5  --  4.3  CL 102  --  108  CO2 27  --  27  GLUCOSE 124*  --  118*  BUN 17  --  15   CREATININE 1.00 0.92 0.92  CALCIUM 9.8  --  8.2*    07/01/2014: CA 19-9-10, CEA-3   RADIOLOGY:  Ct Abdomen Pelvis W Contrast  06/30/2014   CLINICAL  DATA:  Abdominal pain this evening. Intermittent pain for 3 months. Syncopal episode.  EXAM: CT ABDOMEN AND PELVIS WITH CONTRAST  TECHNIQUE: Multidetector CT imaging of the abdomen and pelvis was performed using the standard protocol following bolus administration of intravenous contrast.  CONTRAST:  185mL OMNIPAQUE IOHEXOL 300 MG/ML  SOLN  COMPARISON:  Ultrasound abdomen 05/26/2014  FINDINGS: Lung bases demonstrate evidence of bronchiectasis. No focal infiltration. Ascending thoracic aorta appears dilated although incompletely included on the study.  The gallbladder is mildly distended. Stones seen at ultrasound are not definitely demonstrated on CT, likely non radiopaque. There is mild intra and extrahepatic bile duct dilatation of uncertain significance. No common duct stone is identified but given the stones are not radiopaque, cannot be excluded. No focal liver lesions identified. The pancreas, spleen, adrenal glands, abdominal aorta, inferior vena cava, and retroperitoneal lymph nodes are unremarkable. Mild calcification of the aorta. There is moderate free fluid, likely ascites around the liver and extending along the right pericolic gutter into the pelvis. Diffuse omental and peritoneal nodularity most prominent in the right upper quadrant. Appearance is worrisome for peritoneal carcinomatosis. Small esophageal hiatal hernia. Stomach appears otherwise normal. There is mild dilatation of fluid-filled small bowel down to the level of the mid/distal ileum where there is a transition zone to decompressed bowel in the right lower quadrant. This suggests a partial small bowel obstruction. Cause of obstruction is not demonstrated. Colon is mostly decompressed. Small cyst in the left kidney. No solid mass or hydronephrosis in either kidney. No free air in the  abdomen.  Pelvis: Prostate gland is mildly enlarged. Bladder wall is not thickened. Free fluid in the pelvis extending down from the abdomen. Appendix is segmentally visualized and appears normal. No pelvic mass or lymphadenopathy. Mild degenerative changes in the spine. No destructive bone lesions.  IMPRESSION: Dilatation of fluid-filled small bowel with transition zone in the right lower quadrant consistent with partial obstruction. Mild to moderate right abdominal and pelvic ascites. 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 Distended gallbladder with mild bile duct dilatation. Stones seen a previous ultrasound are not visualized and are likely non radiopaque. Small esophageal hiatal hernia. Bronchiectasis in the lung bases.   Electronically Signed   By: Lucienne Capers M.D.   On: 06/30/2014 22:11   Ct Biopsy  07/01/2014   CLINICAL DATA:  Bowel obstruction, ascites, nodular omental disease.  EXAM: CT GUIDED CORE BIOPSY OF OMENTUM  ANESTHESIA/SEDATION: Intravenous Fentanyl and Versed were administered as conscious sedation during continuous cardiorespiratory monitoring by the radiology RN, with a total moderate sedation time of 6 minutes.  PROCEDURE: The procedure risks, benefits, and alternatives were explained to the patient. Questions regarding the procedure were encouraged and answered. The patient understands and consents to the procedure.  Patient placed supine. Limited axial scans through the abdomen were obtained and an appropriate skin entry site was determined. Site was marked.  The operative field was prepped with Betadinein a sterile fashion, and a sterile drape was applied covering the operative field. A sterile gown and sterile gloves were used for the procedure. Local anesthesia was provided with 1% Lidocaine.  Under CT fluoroscopic guidance, a 17 gauge trocar needle was advanced to the margin of the lesion. Once needle tip position was confirmed, coaxial  18-gauge core biopsy samples were obtained, submitted in formalin to surgical pathology. The guide needle was removed. Postprocedure scans show no hemorrhage or other apparent complication.  COMPLICATIONS: None immediate  FINDINGS: Limited scans through the mid abdomen  again demonstrate nodular omental disease and a small amount of scattered abdominal ascites. Multiple distended small bowel loops are identified. Oral contrast material is in the decompressed colon.  CT guided core biopsy of nodular omental disease was performed without complication.  IMPRESSION: 1. Technically successful CT-guided core biopsy of nodular omental disease.   Electronically Signed   By: Lucrezia Europe M.D.   On: 07/01/2014 14:50    Assessment and Plan:   1. Abdominal pain 2. Small bowel obstruction  CT 06/30/2014 consistent with a partial small bowel obstruction and abdominal carcinomatosis  3. Iron deficiency, Hemoccult positive stool 4. Bronchiectasis 5. 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 A CT reveals evidence of abdominal carcinomatosis. He underwent a biopsy of the omentum yesterday and the pathology is pending. I discussed the differential diagnosis with [name]. It is possible the current presentation represents an infection, but a malignancy is more likely. He is undergone recent upper endoscopy and colonoscopy procedures which make gastric and colon cancers less likely.  The bile duct is dilated which may suggest a pancreatic or biliary tumor, but the CA 19-9 is normal.  Recommendations: 1. Continue management of the bowel obstruction per the surgical service 2. Follow-up on the pathology from the 07/01/2014 biopsy 3. Additional staging and treatment recommendations pending the biopsy result     Betsy Coder, MD 07/02/2014, 1:51 PM

## 2014-07-03 ENCOUNTER — Telehealth: Payer: Self-pay | Admitting: *Deleted

## 2014-07-03 ENCOUNTER — Other Ambulatory Visit: Payer: Self-pay | Admitting: *Deleted

## 2014-07-03 ENCOUNTER — Encounter (HOSPITAL_COMMUNITY): Payer: Self-pay | Admitting: Internal Medicine

## 2014-07-03 ENCOUNTER — Other Ambulatory Visit: Payer: Self-pay | Admitting: Oncology

## 2014-07-03 DIAGNOSIS — C7A00 Malignant carcinoid tumor of unspecified site: Secondary | ICD-10-CM

## 2014-07-03 DIAGNOSIS — C800 Disseminated malignant neoplasm, unspecified: Secondary | ICD-10-CM

## 2014-07-03 DIAGNOSIS — C269 Malignant neoplasm of ill-defined sites within the digestive system: Secondary | ICD-10-CM

## 2014-07-03 HISTORY — DX: Disseminated malignant neoplasm, unspecified: C80.0

## 2014-07-03 LAB — GLUCOSE, CAPILLARY: Glucose-Capillary: 105 mg/dL — ABNORMAL HIGH (ref 70–99)

## 2014-07-03 MED ORDER — HYDROCODONE-ACETAMINOPHEN 5-300 MG PO TABS: 1.0000 | ORAL_TABLET | ORAL | Status: DC | PRN

## 2014-07-03 MED ORDER — ONDANSETRON 4 MG PO TBDP: 4.0000 mg | ORAL_TABLET | ORAL | Status: DC | PRN

## 2014-07-03 NOTE — Progress Notes (Signed)
Subjective: The patient is tolerating full liquids without nausea or vomiting Passing flatus Two BM yesterday  Preliminary path on omental bx - adenocarcinoma; more studies pending to try to determine primary site of tumor.  Have discussed with Dr. [name] - further work-up can be done as an outpatient. Objective: Vital signs in last 24 hours: Temp:  [97.6 F (36.4 C)-98.5 F (36.9 C)] 97.6 F (36.4 C) (03/11 0600) Pulse Rate:  [57-72] 72 (03/11 0600) Resp:  [16-18] 16 (03/11 0600) BP: (111-158)/(63-82) 131/70 mmHg (03/11 0600) SpO2:  [98 %-100 %] 100 % (03/11 0600) Weight:  [191 lb 2.2 oz (86.7 kg)] 191 lb 2.2 oz (86.7 kg) (03/10 2204) Last BM Date: 07/02/14  Intake/Output from previous day: 03/10 0701 - 03/11 0700 In: 1080 [P.O.:1080] Out: 1003 [Urine:1001; Stool:2] Intake/Output this shift:    General appearance: alert, cooperative and no distress Resp: clear to auscultation bilaterally Cardio: regular rate and rhythm, S1, S2 normal, no murmur, click, rub or gallop GI: active bowel sounds; mildly distended; no localized tenderness  Lab Results:   Recent Labs  07/01/14 0104 07/01/14 1020  WBC 9.5 7.6  HGB 10.1* 9.5*  HCT 31.0* 30.1*  PLT 299 285   BMET  Recent Labs  06/30/14 1940 07/01/14 0104 07/01/14 1020  NA 137  --  138  K 4.5  --  4.3  CL 102  --  108  CO2 27  --  27  GLUCOSE 124*  --  118*  BUN 17  --  15  CREATININE 1.00 0.92 0.92  CALCIUM 9.8  --  8.2*   PT/INR No results for input(s): LABPROT, INR in the last 72 hours. ABG No results for input(s): PHART, HCO3 in the last 72 hours.  Invalid input(s): PCO2, PO2  Studies/Results: Ct Biopsy  07/01/2014   CLINICAL DATA:  Bowel obstruction, ascites, nodular omental disease.  EXAM: CT GUIDED CORE BIOPSY OF OMENTUM  ANESTHESIA/SEDATION: Intravenous Fentanyl and Versed were administered as conscious sedation during continuous cardiorespiratory monitoring by the radiology RN, with a total  moderate sedation time of 6 minutes.  PROCEDURE: The procedure risks, benefits, and alternatives were explained to the patient. Questions regarding the procedure were encouraged and answered. The patient understands and consents to the procedure.  Patient placed supine. Limited axial scans through the abdomen were obtained and an appropriate skin entry site was determined. Site was marked.  The operative field was prepped with Betadinein a sterile fashion, and a sterile drape was applied covering the operative field. A sterile gown and sterile gloves were used for the procedure. Local anesthesia was provided with 1% Lidocaine.  Under CT fluoroscopic guidance, a 17 gauge trocar needle was advanced to the margin of the lesion. Once needle tip position was confirmed, coaxial 18-gauge core biopsy samples were obtained, submitted in formalin to surgical pathology. The guide needle was removed. Postprocedure scans show no hemorrhage or other apparent complication.  COMPLICATIONS: None immediate  FINDINGS: Limited scans through the mid abdomen again demonstrate nodular omental disease and a small amount of scattered abdominal ascites. Multiple distended small bowel loops are identified. Oral contrast material is in the decompressed colon.  CT guided core biopsy of nodular omental disease was performed without complication.  IMPRESSION: 1. Technically successful CT-guided core biopsy of nodular omental disease.   Electronically Signed   By: Lucrezia Europe M.D.   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  Further pathology stains pending CEA/ CA 19-9 normal Patient seems to be tolerating full liquids well Further work-up can be done as an outpatient Discharge per primary team.  Further followup with Dr. [name] - Oncology and Dr. [name] - Surgery  Recommend nutritional supplements - Boost,  Ensure   LOS: 3 days    Estus Krakowski K. 07/03/2014

## 2014-07-03 NOTE — Progress Notes (Unsigned)
Per Dr. [name] : Needs to be seen for hospital f/u on 3/16 at 11 am and needs PET scan prior.

## 2014-07-03 NOTE — Progress Notes (Signed)
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 He is tolerating a liquid diet without nausea. He had a bowel movement.  Objective: Vital signs in last 24 hours: Blood pressure 131/70, pulse 72, temperature 97.6 F (36.4 C), temperature source Oral, resp. rate 16, height 6\' 1"  (1.854 m), weight 191 lb 2.2 oz (86.7 kg), SpO2 100 %.  Intake/Output from previous day: 03/10 0701 - 03/11 0700 In: 1080 [P.O.:1080] Out: 1003 [Urine:1001; Stool:2]  Physical Exam: Not performed today  Lab Results:  Recent Labs  07/01/14 0104 07/01/14 1020  WBC 9.5 7.6  HGB 10.1* 9.5*  HCT 31.0* 30.1*  PLT 299 285    BMET  Recent Labs  06/30/14 1940 07/01/14 0104 07/01/14 1020  NA 137  --  138  K 4.5  --  4.3  CL 102  --  108  CO2 27  --  27  GLUCOSE 124*  --  118*  BUN 17  --  15  CREATININE 1.00 0.92 0.92  CALCIUM 9.8  --  8.2*    Studies/Results: Ct Biopsy  07/01/2014   CLINICAL DATA:  Bowel obstruction, ascites, nodular omental disease.  EXAM: CT GUIDED CORE BIOPSY OF OMENTUM  ANESTHESIA/SEDATION: Intravenous Fentanyl and Versed were administered as conscious sedation during continuous cardiorespiratory monitoring by the radiology RN, with a total moderate sedation time of 6 minutes.  PROCEDURE: The procedure risks, benefits, and alternatives were explained to the patient. Questions regarding the procedure were encouraged and answered. The patient understands and consents to the procedure.  Patient placed supine. Limited axial scans through the abdomen were obtained and an appropriate skin entry site was determined. Site was marked.  The operative field was prepped with Betadinein a sterile fashion, and a sterile drape was applied covering the operative field. A sterile gown and sterile gloves were used for the procedure. Local anesthesia was provided with 1% Lidocaine.  Under CT fluoroscopic guidance, a 17 gauge trocar needle was advanced to the margin of the  lesion. Once needle tip position was confirmed, coaxial 18-gauge core biopsy samples were obtained, submitted in formalin to surgical pathology. The guide needle was removed. Postprocedure scans show no hemorrhage or other apparent complication.  COMPLICATIONS: None immediate  FINDINGS: Limited scans through the mid abdomen again demonstrate nodular omental disease and a small amount of scattered abdominal ascites. Multiple distended small bowel loops are identified. Oral contrast material is in the decompressed colon.  CT guided core biopsy of nodular omental disease was performed without complication.  IMPRESSION: 1. Technically successful CT-guided core biopsy of nodular omental disease.   Electronically Signed   By: Wesley Dillon M.D.   On: 07/01/2014 14:50    Medications: I have reviewed the patient's current medications.  Assessment/Plan:  1. 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 Abdominal pain secondary to #1 3. Iron deficiency, Hemoccult positive stool 4. Partial small bowel obstruction-clinically improved 5. Bronchiectasis 6. History of MAI pulmonary infection January 2015  I discussed the pathology result with [name] and his wife. There is no apparent primary tumor site. The differential diagnosis includes a small bowel carcinoma, a pancreaticobiliary carcinoma and metastatic disease from other occult primary tumor sites.  I explained no therapy will be curative and treatment will generally consist of systemic chemotherapy. We will await the final immunohistochemical stains from the omentum biopsy to decide on a treatment plan.  We will consider a gene array assay and mutation testing on the biopsy material to look for a "targeted "therapy.  Recommendations: 1. Outpatient PET scan 2. Outpatient follow-up at the [facility] next  week. 3. Management of obstructive symptoms per surgery   LOS: 3 days   Powers  07/03/2014, 8:22 AM

## 2014-07-03 NOTE — Telephone Encounter (Signed)
Provided patient with his PET scan appointment for 3/15 at 2:00/2:30 and office visit with Dr. [name] on 3/16 at 11:00. Reviewed prep for PET scan.

## 2014-07-03 NOTE — Progress Notes (Signed)
Patient Discharge:  Disposition: Pt discharged home with wife  Education: Pt educated on follow up appointments, medications, and all discharge instructions. Pt verbalized understanding.  IV: Removed  Telemetry: N/A  Follow-up appointments: Reviewed with pt  Prescriptions: Scripts given to pt.   Transportation: Transported home by wife  Belongings: All belongings taken with pt.

## 2014-07-03 NOTE — Telephone Encounter (Signed)
Left VM on mobile # for patient to call office regarding follow up appointments. Will also post the information to My Chart.

## 2014-07-03 NOTE — Discharge Summary (Signed)
Physician Discharge Summary  Wesley Dillon HWE:[id number] DOB: [birth year] DOA: 06/30/2014  PCP: Gerrit Heck, MD  Admit date: 06/30/2014 Discharge date: 07/03/2014  Time spent: 45 minutes  Recommendations for Outpatient Follow-up:  1. 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 He sates that the pain comes in attacks and goes away spontaneously. He states no inciting factors and no relieving factors. He states that there is no relation with the food. He states he has had nausea associated no weight loss is noted. He has no hematemesis. He states he checked his stool for blood and this was positive. He saw a GI and had an EGD which showed some gastritis. He was started on PPI for about a month with no real improvement. Patient had an ultrasound and at that time he was noted to have gallstones. He was scheduled for a cholecystectomy next Tuesday which was expected to relieve his pain but his pain got worse today. In the ED he had a CT scan done and this shows possible carcinomatosis with free ascites. He has been also noted to be anemic. He has no SOB. He states that when he exerts he will get a little SOB. He has never smoked and he states he is casual drinking. He has a history of MAC and has been treated for 9 months with a three drug regimen.  Hospital Course:  Principal Problem:  Omental masses/ adenocarcinoma of unknown primary- Abdominal pain resulting from partial SBO? 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   Active Problems:  Pulmonary nodules- treated for MAI in 2032for 9 Dillon- h/o bronchiectasis - follows with Dr [name] as outpt- last visit 12/15- plans were to repeat CT in 10/16  HTN - on Losartan and Inderal  Procedures:  IR guided omental biopsy  Consultations:  Oncology  GI  IR   Surgery  Discharge Exam: Filed Weights   07/01/14 0022 07/01/14 2134 07/02/14 2204  Weight: 86.2 kg (190 lb 0.6 oz) 86.578 kg (190 lb 13.9 oz) 86.7 kg (191 lb 2.2 oz)   Filed Vitals:   07/03/14 0900  BP: 138/73  Pulse: 61  Temp: 97.7 F (36.5 C)  Resp: 16    General: AAO x 3, no distress Cardiovascular: RRR, no murmurs  Respiratory: clear to auscultation bilaterally GI: soft, non-tender, non-distended, bowel sound positive  Discharge Instructions You were cared for by a hospitalist during your hospital stay. If you have any questions about your discharge medications or the care you received while you were in  the hospital after you are discharged, you can call the unit and asked to speak with the hospitalist on call if the hospitalist that took care of you is not available. Once you are discharged, your primary care physician will handle any further medical issues. Please note that NO REFILLS for any discharge medications will be authorized once you are discharged, as it is imperative that you return to your primary care physician (or establish a relationship with a primary care physician if you do not have one) for your  aftercare needs so that they can reassess your need for medications and monitor your lab values.      Discharge Instructions    Discharge instructions    Complete by:  As directed   Diet as tolerated Take Colace and Senna to prevent constipation from Narcotics     Increase activity slowly    Complete by:  As directed             Medication List    STOP taking these medications - he was not taking these meds       azithromycin 500 MG tablet  Commonly known as:  ZITHROMAX     ethambutol 400 MG tablet  Commonly known as:  MYAMBUTOL     rifabutin 150 MG capsule  Commonly known as:  MYCOBUTIN      TAKE these medications        atorvastatin 10 MG tablet  Commonly known as:  LIPITOR  Take 10 mg by mouth daily.     Hydrocodone-Acetaminophen 5-300 MG Tabs  Commonly known as:  VICODIN  Take 1-2 tablets by mouth every 4 (four) hours as needed.     ipratropium 0.06 % nasal spray  Commonly known as:  ATROVENT  Place 2 sprays into both nostrils every 6 (six) hours as needed for rhinitis.     losartan 25 MG tablet  Commonly known as:  COZAAR  Take 1 tablet (25 mg total) by mouth daily.     ondansetron 4 MG disintegrating tablet  Commonly known as:  ZOFRAN ODT  Take 1 tablet (4 mg total) by mouth every 4 (four) hours as needed for nausea or vomiting.     PRESERVISION AREDS PO  Take 2 capsules by mouth daily.     primidone 250 MG tablet  Commonly known as:  MYSOLINE  Take 1 tablet (250 mg total) by mouth at bedtime.     propranolol ER 80 MG 24 hr capsule  Commonly known as:  INDERAL LA  Take 1 capsule (80 mg total) by mouth daily.       No Known Allergies   - The results of significant diagnostics from this hospitalization (including imaging, microbiology, ancillary and laboratory) are listed below for reference.    Significant Diagnostic Studies: Ct Abdomen Pelvis W Contrast  06/30/2014   CLINICAL DATA:  Abdominal pain this evening. Intermittent pain for 3  months. Syncopal episode.  EXAM: CT ABDOMEN AND PELVIS WITH CONTRAST  TECHNIQUE: Multidetector CT imaging of the abdomen and pelvis was performed using the standard protocol following bolus administration of intravenous contrast.  CONTRAST:  15mL OMNIPAQUE IOHEXOL 300 MG/ML  SOLN  COMPARISON:  Ultrasound abdomen 05/26/2014  FINDINGS: Lung bases demonstrate evidence of bronchiectasis. No focal infiltration. Ascending thoracic aorta appears dilated although incompletely included on the study.  The gallbladder is mildly distended. Stones seen at ultrasound are not definitely demonstrated on CT, likely non radiopaque. There is mild intra and extrahepatic bile duct dilatation of  uncertain significance. No common duct stone is identified but given the stones are not radiopaque, cannot be excluded. No focal liver lesions identified. The pancreas, spleen, adrenal glands, abdominal aorta, inferior vena cava, and retroperitoneal lymph nodes are unremarkable. Mild calcification of the aorta. There is moderate free fluid, likely ascites around the liver and extending along the right pericolic gutter into the pelvis. Diffuse omental and peritoneal nodularity most prominent in the right upper quadrant. Appearance is worrisome for peritoneal carcinomatosis. Small esophageal hiatal hernia. Stomach appears otherwise normal. There is mild dilatation of fluid-filled small bowel down to the level of the mid/distal ileum where there is a transition zone to decompressed bowel in the right lower quadrant. This suggests a partial small bowel obstruction. Cause of obstruction is not demonstrated. Colon is mostly decompressed. Small cyst in the left kidney. No solid mass or hydronephrosis in either kidney. No free air in the abdomen.  Pelvis: Prostate gland is mildly enlarged. Bladder wall is not thickened. Free fluid in the pelvis extending down from the abdomen. Appendix is segmentally visualized and appears normal. No pelvic mass or  lymphadenopathy. Mild degenerative changes in the spine. No destructive bone lesions.  IMPRESSION: Dilatation of fluid-filled small bowel with transition zone in the right lower quadrant consistent with partial obstruction. Mild to moderate right abdominal and pelvic ascites. 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 Distended gallbladder with mild bile duct dilatation. Stones seen a previous ultrasound are not visualized and are likely non radiopaque. Small esophageal hiatal hernia. Bronchiectasis in the lung bases.   Electronically Signed   By: Lucienne Capers M.D.   On: 06/30/2014 22:11   Ct Biopsy  07/01/2014   CLINICAL DATA:  Bowel obstruction, ascites, nodular omental disease.  EXAM: CT GUIDED CORE BIOPSY OF OMENTUM  ANESTHESIA/SEDATION: Intravenous Fentanyl and Versed were administered as conscious sedation during continuous cardiorespiratory monitoring by the radiology RN, with a total moderate sedation time of 6 minutes.  PROCEDURE: The procedure risks, benefits, and alternatives were explained to the patient. Questions regarding the procedure were encouraged and answered. The patient understands and consents to the procedure.  Patient placed supine. Limited axial scans through the abdomen were obtained and an appropriate skin entry site was determined. Site was marked.  The operative field was prepped with Betadinein a sterile fashion, and a sterile drape was applied covering the operative field. A sterile gown and sterile gloves were used for the procedure. Local anesthesia was provided with 1% Lidocaine.  Under CT fluoroscopic guidance, a 17 gauge trocar needle was advanced to the margin of the lesion. Once needle tip position was confirmed, coaxial 18-gauge core biopsy samples were obtained, submitted in formalin to surgical pathology. The guide needle was removed. Postprocedure scans show no hemorrhage or other apparent complication.  COMPLICATIONS: None immediate   FINDINGS: Limited scans through the mid abdomen again demonstrate nodular omental disease and a small amount of scattered abdominal ascites. Multiple distended small bowel loops are identified. Oral contrast material is in the decompressed colon.  CT guided core biopsy of nodular omental disease was performed without complication.  IMPRESSION: 1. Technically successful CT-guided core biopsy of nodular omental disease.   Electronically Signed   By: Lucrezia Europe M.D.   On: 07/01/2014 14:50    Microbiology: No results found for this or any previous visit (from the past 240 hour(s)).   Labs: Basic Metabolic Panel:  Recent Labs Lab 06/30/14 1940 07/01/14 0104 07/01/14 1020  NA 137  --  138  K 4.5  --  4.3  CL 102  --  108  CO2 27  --  27  GLUCOSE 124*  --  118*  BUN 17  --  15  CREATININE 1.00 0.92 0.92  CALCIUM 9.8  --  8.2*   Liver Function Tests:  Recent Labs Lab 06/30/14 1940 07/01/14 1020  AST 28 23  ALT 22 19  ALKPHOS 69 56  BILITOT 0.5 0.5  PROT 7.6 5.8*  ALBUMIN 4.3 3.3*    Recent Labs Lab 06/30/14 1940  LIPASE 39   No results for input(s): AMMONIA in the last 168 hours. CBC:  Recent Labs Lab 06/30/14 1940 07/01/14 0104 07/01/14 1020  WBC 10.8* 9.5 7.6  NEUTROABS 8.1*  --   --   HGB 11.6* 10.1* 9.5*  HCT 35.9* 31.0* 30.1*  MCV 90.2 90.6 91.5  PLT 395 299 285   Cardiac Enzymes: No results for input(s): CKTOTAL, CKMB, CKMBINDEX, TROPONINI in the last 168 hours. BNP: BNP (last 3 results) No results for input(s): BNP in the last 8760 hours.  ProBNP (last 3 results) No results for input(s): PROBNP in the last 8760 hours.  CBG:  Recent Labs Lab 06/30/14 1945 07/01/14 0018 07/01/14 0745 07/02/14 0803 07/03/14 0751  GLUCAP 152* 114* 111* 123* 105*       Signed:  Debbe Odea, MD [facility] 07/03/2014, 11:36 AM

## 2014-07-07 ENCOUNTER — Encounter (HOSPITAL_COMMUNITY): Admission: RE | Payer: Self-pay | Source: Ambulatory Visit

## 2014-07-07 ENCOUNTER — Encounter (HOSPITAL_COMMUNITY)
Admission: RE | Admit: 2014-07-07 | Discharge: 2014-07-07 | Disposition: A | Discharge status: Home / Self Care | Payer: 59 | Source: Ambulatory Visit | Attending: Oncology | Admitting: Oncology

## 2014-07-07 ENCOUNTER — Ambulatory Visit (HOSPITAL_COMMUNITY): Admission: RE | Admit: 2014-07-07 | Payer: 59 | Source: Ambulatory Visit | Admitting: General Surgery

## 2014-07-07 ENCOUNTER — Telehealth: Payer: Self-pay | Admitting: *Deleted

## 2014-07-07 DIAGNOSIS — C7A00 Malignant carcinoid tumor of unspecified site: Secondary | ICD-10-CM | POA: Insufficient documentation

## 2014-07-07 LAB — GLUCOSE, CAPILLARY: Glucose-Capillary: 104 mg/dL — ABNORMAL HIGH (ref 70–99)

## 2014-07-07 SURGERY — LAPAROSCOPIC CHOLECYSTECTOMY WITH INTRAOPERATIVE CHOLANGIOGRAM
Anesthesia: General

## 2014-07-07 MED ORDER — FLUDEOXYGLUCOSE F - 18 (FDG) INJECTION
9.3000 | Freq: Once | INTRAVENOUS | Status: AC | PRN
  Administered 2014-07-07: 9.3 via INTRAVENOUS

## 2014-07-07 NOTE — Telephone Encounter (Signed)
PT.'S CLAIM #[id number]. ATTENDING PHYSICIAN FORM IS BEING FAXED TO THIS OFFICE. THIS NOTE WAS ROUTED TO EBONY SMITH IN MANAGED CARE.

## 2014-07-08 ENCOUNTER — Encounter: Payer: Self-pay | Admitting: *Deleted

## 2014-07-08 ENCOUNTER — Telehealth: Payer: Self-pay | Admitting: *Deleted

## 2014-07-08 ENCOUNTER — Encounter: Payer: Self-pay | Admitting: Oncology

## 2014-07-08 ENCOUNTER — Telehealth: Payer: Self-pay | Admitting: Oncology

## 2014-07-08 ENCOUNTER — Ambulatory Visit (HOSPITAL_BASED_OUTPATIENT_CLINIC_OR_DEPARTMENT_OTHER): Payer: 59 | Admitting: Oncology

## 2014-07-08 ENCOUNTER — Ambulatory Visit (HOSPITAL_COMMUNITY): Payer: 59

## 2014-07-08 VITALS — BP 111/56 | HR 49 | Temp 97.8°F | Resp 18 | Ht 73.0 in | Wt 183.7 lb

## 2014-07-08 DIAGNOSIS — C800 Disseminated malignant neoplasm, unspecified: Secondary | ICD-10-CM

## 2014-07-08 DIAGNOSIS — D509 Iron deficiency anemia, unspecified: Secondary | ICD-10-CM

## 2014-07-08 DIAGNOSIS — C786 Secondary malignant neoplasm of retroperitoneum and peritoneum: Secondary | ICD-10-CM

## 2014-07-08 DIAGNOSIS — C801 Malignant (primary) neoplasm, unspecified: Secondary | ICD-10-CM

## 2014-07-08 MED ORDER — LIDOCAINE-PRILOCAINE 2.5-2.5 % EX CREA: 1.0000 "application " | TOPICAL_CREAM | CUTANEOUS | Status: DC | PRN

## 2014-07-08 MED ORDER — PROCHLORPERAZINE MALEATE 10 MG PO TABS: 10.0000 mg | ORAL_TABLET | Freq: Four times a day (QID) | ORAL | Status: AC | PRN

## 2014-07-08 NOTE — Progress Notes (Signed)
I placed [employer] forms on desk for Dr. [name].

## 2014-07-08 NOTE — Telephone Encounter (Signed)
Gave avs & calendar for March/April. Sent message to schedule chemo.

## 2014-07-08 NOTE — Progress Notes (Signed)
Met with patient and wife during new patient visit. Explained the role of the GI Nurse Navigator and provided New Patient Packet with information on: 1. Support groups 2. Advanced Directives 3. Fall Safety Plan Answered questions, reviewed current treatment plan using TEACH back and provided emotional support. Provided copy of current treatment plan via AVS. He declines need for dietician referral at this time. Provided emotional support for wife and encouraged patient to take care of himself before he tries to return to work immediately.   Merceda Elks, RN, BSN GI Oncology [place]

## 2014-07-08 NOTE — Patient Instructions (Addendum)
[facility] Discharge Instructions  RECOMMENDATIONS MADE BY THE CONSULTANT AND ANY TEST RESULTS WILL BE SENT TO YOUR REFERRING PHYSICIAN.  EXAM FINDINGS BY THE PHYSICIAN TODAY : Probable small bowel cancer with single liver mets, mets to abdominal cavity and possible lung mets.  Dr. [name] will review the PET with radiologist  MEDICATIONS PRESCRIBED:  Oxaliplatin, Leucovorin, and 5- FU (Fluorouracil)  INSTRUCTIONS GIVEN AND DISCUSSED:  Continue to keep stools soft with colace/Miralax Continue soft/liquid  SPECIAL INSTRUCTIONS/FOLLOW-UP:  Schedule for chemo class Will schedule PAC placement with interventional radiology Will send tissue sample to be tested for mutations via [facility] (Will take 10 days for results) Begin FOLFOX regimen IV every 2 weeks for  4-5 rounds, then repeat CT scan Thank you for choosing [facility] to provide your oncology and hematology care.  To afford each patient quality time with our providers, please arrive at least 30 minutes before your scheduled appointment time.  With your help, our goal is to use those 30 minutes to complete the necessary work-up to ensure our physicians have the information they need to help with your evaluation and healthcare recommendations.     ___________________  Should you have questions after your visit to [facility], please contact our office at [phone] between the hours of 8:30 a.m. and 4:30 p.m.  Voicemails left after 4:00 p.m. will not be returned until the following business day.  For prescription refill requests, have your pharmacy contact our office with your prescription refill request. We request 24 hour notice for all refill requests.

## 2014-07-08 NOTE — Telephone Encounter (Signed)
Per staff message and POF I have scheduled appts. Advised scheduler of appts. JMW  

## 2014-07-08 NOTE — Progress Notes (Signed)
Faxed signed form/request for [facility] Testing on case [phone] per MD request to [facility] Pathology department.

## 2014-07-08 NOTE — Progress Notes (Signed)
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 He reports tolerating a liquid diet. He is having bowel movements. He has intermittent nausea and abdominal pain. He takes hydrocodone for pain. The pain is chiefly localized to the right lower abdomen. The biopsy from the omentum returned positive for adenocarcinoma. The tumor is positive for cytokeratin 7. There was equivocal positivity for cytokeratin 20. CDX2, TTF-1, and prostein were negative.    Objective:  Vital signs in last 24 hours:  There were no vitals taken for this visit.    Resp: Distant breath sounds, no respiratory distress Cardio: Regular rate and rhythm GI: No hepatomegaly, mildly distended, no apparent ascites, no mass, nontender Vascular: No leg edema  Lab Results:  Lab Results  Component Value Date   WBC 7.6 07/01/2014   HGB 9.5* 07/01/2014   HCT 30.1* 07/01/2014   MCV 91.5 07/01/2014   PLT 285 07/01/2014   NEUTROABS 8.1* 06/30/2014      Lab Results  Component Value Date   CEA 3.0 07/01/2014    Imaging:  Nm Pet Image Initial (pi) Skull Base To Thigh  07/08/2014   CLINICAL DATA:  Initial treatment strategy for neuroendocrine tumor.  EXAM: NUCLEAR MEDICINE PET SKULL BASE TO THIGH  TECHNIQUE: 9.3 mCi F-18 FDG was injected intravenously. Full-ring PET imaging was performed from the skull base to thigh after the radiotracer. CT data was obtained and used for attenuation correction and anatomic localization.  FASTING BLOOD GLUCOSE:  Value: 104 mg/dl  COMPARISON:  CT abdomen pelvis 06/30/2014  FINDINGS: NECK  No hypermetabolic lymph nodes in the neck.  CHEST  Hypermetabolic nodule within the left upper lobe measures 14 mm (image 84, series 4) with SUV max equal 12.2. Cluster of smaller nodules in the right lower lobe are with SUV max equal 3.8 (image 86, series 4.  ABDOMEN/PELVIS  There are multiple foci of  hypermetabolic activity associated with asymmetric bowel wall thickening. This is most suggestive serosal implants onto the small bowel and colon. Example lesion in the right lower quadrant associated with the descending colon (image 185, series 4) with SUV max equal 7.5. Several lesions associated with the small bowel in the upper pelvis on image 178 of the fused data set. There is thickening along the hepatic flexure of the colon with hypermetabolic activity (image 169, series 4).  There is hypermetabolic activity associated with thickening at the terminal ileum. This could indicate primary site. (Image 153 of the fused data set)  There is a discrete lesion within the inferior right hepatic lobe adjacent t othe gallbladder fossa with SUV max equal 7.3. This is identified in retrospect on the comparison contrast CT. There is a large gallstone within the lumen of the gallbladder. No abnormal gallbladder activity. There is a hypermetabolic periportal lymph node. No discrete abnormal metabolic activity associated with the pancreas.  SKELETON  No focal hypermetabolic activity to suggest skeletal metastasis.  IMPRESSION: 1. Multiple sites of asymmetric hypermetabolic bowel wall thickening within the large and small bowel are most consistent with serosal implants from carcinomatosis. 2. Thickening and metabolic activity at the terminal ileum could represent the primary site neoplasm. 3. Solitary metastasis to the inferior right hepatic lobe. 4. No abnormal metabolic activity within the pancreas or gallbladder. 5. Pulmonary metastasis within the left upper lobe and right lower lobe. 6. Moderate volume of ascites and small right pleural effusion.   Electronically Signed   By:  Suzy Bouchard M.D.   On: 07/08/2014 08:22    Medications: I have reviewed the patient's current medications.  Assessment/Plan:  1. 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  CT  06/30/2014 consistent with a partial small bowel obstruction and abdominal carcinomatosis  Omentum biopsy 07/01/2014 confirmed adenocarcinoma, nonspecific staining pattern  PET scan 07/07/2014 with multiple areas of hypermetabolic bowel wall thickening consistent with serosal implants from carcinomatosis, thickening and hypermetabolic activity at the terminal ileum felt to potentially represent a primary neoplasm, solitary right liver metastasis, hypermetabolic nodular lung lesions  2. Abdominal pain secondary to #1 3. Iron deficiency, Hemoccult positive stool 4. Partial small bowel obstruction-clinically improved 5. Bronchiectasis 6. History of MAI pulmonary infection January 2015    Disposition:  I reviewed the PET images and discussed the pathology report with [name] and his wife. He has been diagnosed with metastatic adenocarcinoma. There is no clear primary tumor site, but the PET scan is suggestive of a small bowel primary. The iron deficiency anemia is consistent with a primary GI tumor.  No therapy will be curative. We discussed treatment options. I recommend FOLFOX chemotherapy. We reviewed the potential toxicities associated with this regimen including the chance for nausea/vomiting, mucositis, alopecia, diarrhea, and hematologic toxicity. We discussed the hyperpigmentation, rash, and hand/foot syndrome associated with 5-fluorouracil. We discussed the various types of neuropathy seen with oxaliplatin. He agrees to proceed. He will attend a chemotherapy teaching class.  I offered to refer him for a second opinion. He would rather begin FOLFOX chemotherapy as soon as possible.  He will be referred for placement of a Port-A-Cath with the plan to begin chemotherapy 07/13/2014.  I submitted his tumor for mutation testing.  Betsy Coder, MD  07/08/2014  2:18 PM

## 2014-07-08 NOTE — Addendum Note (Signed)
Addended by: Betsy Coder B on: 07/08/2014 02:40 PM   Modules accepted: Level of Service

## 2014-07-09 ENCOUNTER — Other Ambulatory Visit (HOSPITAL_COMMUNITY)
Admission: RE | Admit: 2014-07-09 | Discharge: 2014-07-09 | Disposition: A | Discharge status: Home / Self Care | Payer: 59 | Source: Ambulatory Visit | Attending: Oncology | Admitting: Oncology

## 2014-07-09 ENCOUNTER — Telehealth: Payer: Self-pay | Admitting: Oncology

## 2014-07-09 DIAGNOSIS — C800 Disseminated malignant neoplasm, unspecified: Secondary | ICD-10-CM | POA: Diagnosis present

## 2014-07-09 NOTE — Telephone Encounter (Signed)
Confirm appointment for 03/23.

## 2014-07-10 ENCOUNTER — Other Ambulatory Visit (HOSPITAL_BASED_OUTPATIENT_CLINIC_OR_DEPARTMENT_OTHER): Payer: 59

## 2014-07-10 ENCOUNTER — Other Ambulatory Visit: Payer: 59

## 2014-07-10 ENCOUNTER — Encounter: Payer: Self-pay | Admitting: General Practice

## 2014-07-10 DIAGNOSIS — C801 Malignant (primary) neoplasm, unspecified: Secondary | ICD-10-CM

## 2014-07-10 DIAGNOSIS — C786 Secondary malignant neoplasm of retroperitoneum and peritoneum: Secondary | ICD-10-CM

## 2014-07-10 DIAGNOSIS — C800 Disseminated malignant neoplasm, unspecified: Secondary | ICD-10-CM

## 2014-07-10 LAB — CBC WITH DIFFERENTIAL/PLATELET
BASO%: 0.8 % (ref 0.0–2.0)
BASOS ABS: 0 10*3/uL (ref 0.0–0.1)
EOS ABS: 0.1 10*3/uL (ref 0.0–0.5)
EOS%: 1.8 % (ref 0.0–7.0)
HCT: 28.1 % — ABNORMAL LOW (ref 38.4–49.9)
HEMOGLOBIN: 8.9 g/dL — AB (ref 13.0–17.1)
LYMPH%: 17.9 % (ref 14.0–49.0)
MCH: 28.7 pg (ref 27.2–33.4)
MCHC: 31.6 g/dL — ABNORMAL LOW (ref 32.0–36.0)
MCV: 90.7 fL (ref 79.3–98.0)
MONO#: 0.5 10*3/uL (ref 0.1–0.9)
MONO%: 9.9 % (ref 0.0–14.0)
NEUT%: 69.6 % (ref 39.0–75.0)
NEUTROS ABS: 3.8 10*3/uL (ref 1.5–6.5)
Platelets: 270 10*3/uL (ref 140–400)
RBC: 3.1 10*6/uL — ABNORMAL LOW (ref 4.20–5.82)
RDW: 14.1 % (ref 11.0–14.6)
WBC: 5.5 10*3/uL (ref 4.0–10.3)
lymph#: 1 10*3/uL (ref 0.9–3.3)

## 2014-07-10 LAB — LACTATE DEHYDROGENASE (CC13): LDH: 138 U/L (ref 125–245)

## 2014-07-10 NOTE — Progress Notes (Signed)
Visited with Wesley Dillon and his wife Wesley Dillon in chemo class, introducing [facility] team/resources.  [name] was quiet, engaging a little in conversation.  His countenance appeared distressed.  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  Wesley Dillon interpreted my arrival to chemo class as a divine gesture of care and support.  She is using faith to cope.  She was almost tearful but has vowed not to cry in front of her husband because she doesn't want to upset him further.  Gently encouraged them to be real with each other, reminding them of many layers of support available here at [facility] for each/both of them.  Wesley Dillon hugged me multiple times, also verbalizing appreciation.  [place], [place]

## 2014-07-12 ENCOUNTER — Other Ambulatory Visit: Payer: Self-pay | Admitting: Oncology

## 2014-07-13 ENCOUNTER — Encounter: Payer: Self-pay | Admitting: Oncology

## 2014-07-13 ENCOUNTER — Other Ambulatory Visit: Payer: Self-pay | Admitting: Radiology

## 2014-07-13 ENCOUNTER — Telehealth: Payer: Self-pay | Admitting: *Deleted

## 2014-07-13 NOTE — Progress Notes (Signed)
Patient called back and said yes he would like copies of forms I faxed. I left at front desk and he will pick up 07/15/14 on his next visit.

## 2014-07-13 NOTE — Progress Notes (Unsigned)
I called patient cell ph# and left message I faxed forms to [employer] and to ck to see if he wanted a copy for his records.

## 2014-07-13 NOTE — Telephone Encounter (Signed)
PT. WAS ASKING ABOUT DENTAL CLEANING DURING CHEMOTHERAPY TREATMENT. INSTRUCTED PT. TO NOTIFY DR.SHERRILL'S OFFICE BEFORE HAVING ANY DENTAL WORK DONE WHILE ON CHEMOTHERAPY. PT.'S CBC WILL BE CAREFULLY MONITORED TO DETERMINE THE TIMING FOR DENTAL WORK. HE VOICES UNDERSTANDING.

## 2014-07-13 NOTE — Progress Notes (Signed)
I faxed forms to [facility]

## 2014-07-14 ENCOUNTER — Ambulatory Visit (HOSPITAL_COMMUNITY)
Admission: RE | Admit: 2014-07-14 | Discharge: 2014-07-14 | Disposition: A | Discharge status: Home / Self Care | Payer: 59 | Source: Ambulatory Visit | Attending: Oncology | Admitting: Oncology

## 2014-07-14 ENCOUNTER — Other Ambulatory Visit: Payer: Self-pay | Admitting: Oncology

## 2014-07-14 ENCOUNTER — Encounter (HOSPITAL_COMMUNITY): Payer: Self-pay

## 2014-07-14 ENCOUNTER — Ambulatory Visit (HOSPITAL_COMMUNITY)
Admission: RE | Admit: 2014-07-14 | Discharge: 2014-07-14 | Disposition: A | Discharge status: Home / Self Care | Payer: 59 | Source: Ambulatory Visit | Attending: Diagnostic Radiology | Admitting: Diagnostic Radiology

## 2014-07-14 DIAGNOSIS — H3530 Unspecified macular degeneration: Secondary | ICD-10-CM | POA: Diagnosis not present

## 2014-07-14 DIAGNOSIS — D649 Anemia, unspecified: Secondary | ICD-10-CM | POA: Diagnosis not present

## 2014-07-14 DIAGNOSIS — C800 Disseminated malignant neoplasm, unspecified: Secondary | ICD-10-CM

## 2014-07-14 DIAGNOSIS — G250 Essential tremor: Secondary | ICD-10-CM | POA: Insufficient documentation

## 2014-07-14 DIAGNOSIS — I2510 Atherosclerotic heart disease of native coronary artery without angina pectoris: Secondary | ICD-10-CM | POA: Insufficient documentation

## 2014-07-14 DIAGNOSIS — Z79899 Other long term (current) drug therapy: Secondary | ICD-10-CM | POA: Insufficient documentation

## 2014-07-14 LAB — CBC
HCT: 32.3 % — ABNORMAL LOW (ref 39.0–52.0)
Hemoglobin: 9.9 g/dL — ABNORMAL LOW (ref 13.0–17.0)
MCH: 28.8 pg (ref 26.0–34.0)
MCHC: 30.7 g/dL (ref 30.0–36.0)
MCV: 93.9 fL (ref 78.0–100.0)
PLATELETS: 338 10*3/uL (ref 150–400)
RBC: 3.44 MIL/uL — AB (ref 4.22–5.81)
RDW: 14.2 % (ref 11.5–15.5)
WBC: 7.2 10*3/uL (ref 4.0–10.5)

## 2014-07-14 LAB — PROTIME-INR
INR: 1.11 (ref 0.00–1.49)
PROTHROMBIN TIME: 14.5 s (ref 11.6–15.2)

## 2014-07-14 LAB — BASIC METABOLIC PANEL
Anion gap: 9 (ref 5–15)
BUN: 14 mg/dL (ref 6–23)
CO2: 26 mmol/L (ref 19–32)
Calcium: 8.9 mg/dL (ref 8.4–10.5)
Chloride: 105 mmol/L (ref 96–112)
Creatinine, Ser: 0.95 mg/dL (ref 0.50–1.35)
GFR calc Af Amer: >90 mL/min (ref >90)
GFR, EST NON AFRICAN AMERICAN: 84 mL/min — AB (ref >90)
GLUCOSE: 100 mg/dL — AB (ref 70–99)
POTASSIUM: 4.7 mmol/L (ref 3.5–5.1)
SODIUM: 140 mmol/L (ref 135–145)

## 2014-07-14 LAB — APTT: aPTT: 33 seconds (ref 24–37)

## 2014-07-14 MED ORDER — CEFAZOLIN SODIUM-DEXTROSE 2-3 GM-% IV SOLR
2.0000 g | Freq: Once | INTRAVENOUS | Status: AC
  Administered 2014-07-14: 2 g via INTRAVENOUS

## 2014-07-14 MED ORDER — LIDOCAINE-EPINEPHRINE 2 %-1:100000 IJ SOLN
INTRAMUSCULAR | Status: AC
  Filled 2014-07-14: qty 1

## 2014-07-14 MED ORDER — HEPARIN SOD (PORK) LOCK FLUSH 100 UNIT/ML IV SOLN
INTRAVENOUS | Status: AC | PRN
  Administered 2014-07-14: 500 [IU]

## 2014-07-14 MED ORDER — HEPARIN SOD (PORK) LOCK FLUSH 100 UNIT/ML IV SOLN
INTRAVENOUS | Status: AC
  Filled 2014-07-14: qty 5

## 2014-07-14 MED ORDER — FENTANYL CITRATE 0.05 MG/ML IJ SOLN
INTRAMUSCULAR | Status: AC | PRN
  Administered 2014-07-14: 25 ug via INTRAVENOUS
  Administered 2014-07-14: 50 ug via INTRAVENOUS
  Administered 2014-07-14: 25 ug via INTRAVENOUS

## 2014-07-14 MED ORDER — CEFAZOLIN SODIUM-DEXTROSE 2-3 GM-% IV SOLR
INTRAVENOUS | Status: AC
  Filled 2014-07-14: qty 50

## 2014-07-14 MED ORDER — MIDAZOLAM HCL 2 MG/2ML IJ SOLN
INTRAMUSCULAR | Status: AC
  Filled 2014-07-14: qty 6

## 2014-07-14 MED ORDER — SODIUM CHLORIDE 0.9 % IV SOLN
Freq: Once | INTRAVENOUS | Status: AC
  Administered 2014-07-14: 11:00:00 via INTRAVENOUS

## 2014-07-14 MED ORDER — LIDOCAINE HCL 1 % IJ SOLN
INTRAMUSCULAR | Status: AC
  Filled 2014-07-14: qty 20

## 2014-07-14 MED ORDER — MIDAZOLAM HCL 2 MG/2ML IJ SOLN
INTRAMUSCULAR | Status: AC | PRN
  Administered 2014-07-14 (×4): 1 mg via INTRAVENOUS

## 2014-07-14 MED ORDER — FENTANYL CITRATE 0.05 MG/ML IJ SOLN
INTRAMUSCULAR | Status: AC
  Filled 2014-07-14: qty 4

## 2014-07-14 NOTE — H&P (Signed)
Chief Complaint: "I'm here for a port a cath" Referring Physician(s): Sherrill,Gary B  History of Present Illness: Wesley Dillon is a 69 y.o. 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  Past Medical History  Diagnosis Date  . Tremor     takes Primidone 250 once daily and inderall 160 daily for this-has had it since he was 21  . Pneumonia 06/2012  . Macular degeneration   . Benign essential tremor   . Coronary artery disease   . Anemia 06/30/2014  . 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 Tonsillectomy    . Video bronchoscopy Bilateral 04/29/2013    Procedure: VIDEO BRONCHOSCOPY WITHOUT FLUORO;  Surgeon: Collene Gobble, MD;  Location: [facility];  Service: Cardiopulmonary;  Laterality: Bilateral;    Allergies: Review of patient's allergies indicates no known allergies.  Medications: Prior to Admission medications   Medication Sig Start Date End Date Taking? Authorizing Provider  acetaminophen (TYLENOL) 500 MG tablet Take 1,000 mg by mouth every 6 (six) hours as needed for moderate pain or headache.   Yes Historical Provider, MD  atorvastatin (LIPITOR) 10 MG tablet Take 10 mg by mouth every morning.    Yes Historical Provider, MD  ibuprofen (ADVIL,MOTRIN) 200 MG tablet Take 800 mg by mouth every 6 (six) hours as needed for headache or moderate pain.   Yes Historical Provider, MD  losartan (COZAAR) 25 MG tablet Take 1 tablet (25 mg total) by mouth daily. 09/17/13  Yes Sherren Mocha, MD  Multiple Vitamins-Minerals (PRESERVISION AREDS PO) Take 2 capsules by mouth every morning.    Yes Historical Provider, MD  ondansetron (ZOFRAN ODT) 4 MG disintegrating tablet Take 1 tablet (4 mg total) by mouth every 4 (four) hours as needed for nausea or vomiting. 07/03/14  Yes Debbe Odea, MD  primidone  (MYSOLINE) 250 MG tablet Take 1 tablet (250 mg total) by mouth at bedtime. Patient taking differently: Take 250 mg by mouth every morning.  10/21/13  Yes Carmen Dohmeier, MD  propranolol ER (INDERAL LA) 80 MG 24 hr capsule Take 1 capsule (80 mg total) by mouth daily. 10/21/13  Yes Carmen Dohmeier, MD  Hydrocodone-Acetaminophen (VICODIN) 5-300 MG TABS Take 1-2 tablets by mouth every 4 (four) hours as needed. Patient taking differently: Take 1-2 tablets by mouth every 4 (four) hours as needed (pain).  07/03/14   Debbe Odea, MD  ipratropium (ATROVENT) 0.06 % nasal spray Place 2 sprays into both nostrils every 6 (six) hours as needed for rhinitis. 11/03/13   Tammy S Parrett, NP  lidocaine-prilocaine (EMLA) cream Apply 1 application topically as needed. Apply to [facility] 1-2 hours prior to stick and cover with plastic wrap 07/08/14   Ladell Pier, MD  prochlorperazine (COMPAZINE) 10 MG tablet Take 1 tablet (10 mg total) by mouth every 6 (six) hours as needed for nausea. 07/08/14   Ladell Pier, MD    Family History  Problem Relation Age of Onset  . CAD Father 23    Died MI  . Atrial fibrillation Father   . Asthma Father   . Alzheimer's disease Mother   . Diabetes Mother   . Heart disease Mother   . Atrial fibrillation Mother   . Seizures Son     Died status epilepticus    History   Social History  . Marital Status: Married  Spouse Name: Wesley Dillon  . Number of Children: 2  . Years of Education: Med. Sch.   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 Smoking status: Never Smoker   . Smokeless tobacco: Never Used  . Alcohol Use: No     Comment: wine with dinner  . Drug Use: No  . Sexual Activity: Not on file   Other Topics Concern  . None   Social History Narrative   Patient is married Wesley Dillon) and lives at home with his wife.   Patient has two adult children.   Patient has a college education.   Patient is right-handed.   Patient drinks four  cups of coffee daily.   Lives in [place]   Physician at [employer]   Pneumococcal vaccine pending              Review of Systems  Constitutional: Negative for fever and chills.  Respiratory: Negative for cough and shortness of breath.   Cardiovascular: Negative for chest pain.  Gastrointestinal: Positive for nausea, abdominal pain and blood in stool. Negative for vomiting.  Genitourinary: Negative for dysuria and hematuria.  Musculoskeletal: Negative for back pain.  Neurological: Negative for headaches.  Hematological: Does not bruise/bleed easily.    Vital Signs: BP 105/55 mmHg  Pulse 49  Temp(Src) 97.8 F (36.6 C) (Oral)  Resp 18  SpO2 100%  Physical Exam  Constitutional: He is oriented to person, place, and time.  Thin white male in no acute distress  Cardiovascular:  Bradycardic, regular  Pulmonary/Chest: Effort normal.  Slightly diminished breath sounds at bases  Abdominal: Soft. Bowel sounds are normal. There is tenderness.  Mild distention  Musculoskeletal: Normal range of motion. He exhibits no edema.  Neurological: He is alert and oriented to person, place, and time.    Imaging: Ct Abdomen Pelvis W Contrast  06/30/2014   CLINICAL DATA:  Abdominal pain this evening. Intermittent pain for 3 months. Syncopal episode.  EXAM: CT ABDOMEN AND PELVIS WITH CONTRAST  TECHNIQUE: Multidetector CT imaging of the abdomen and pelvis was performed using the standard protocol following bolus administration of intravenous contrast.  CONTRAST:  172mL OMNIPAQUE IOHEXOL 300 MG/ML  SOLN  COMPARISON:  Ultrasound abdomen 05/26/2014  FINDINGS: Lung bases demonstrate evidence of bronchiectasis. No focal infiltration. Ascending thoracic aorta appears dilated although incompletely included on the study.  The gallbladder is mildly distended. Stones seen at ultrasound are not definitely demonstrated on CT, likely non radiopaque. There is mild intra and extrahepatic bile duct dilatation of uncertain  significance. No common duct stone is identified but given the stones are not radiopaque, cannot be excluded. No focal liver lesions identified. The pancreas, spleen, adrenal glands, abdominal aorta, inferior vena cava, and retroperitoneal lymph nodes are unremarkable. Mild calcification of the aorta. There is moderate free fluid, likely ascites around the liver and extending along the right pericolic gutter into the pelvis. Diffuse omental and peritoneal nodularity most prominent in the right upper quadrant. Appearance is worrisome for peritoneal carcinomatosis. Small esophageal hiatal hernia. Stomach appears otherwise normal. There is mild dilatation of fluid-filled small bowel down to the level of the mid/distal ileum where there is a transition zone to decompressed bowel in the right lower quadrant. This suggests a partial small bowel obstruction. Cause of obstruction is not demonstrated. Colon is mostly decompressed. Small cyst in the left kidney. No solid mass or hydronephrosis in either kidney. No free air in the abdomen.  Pelvis: Prostate gland is mildly  enlarged. Bladder wall is not thickened. Free fluid in the pelvis extending down from the abdomen. Appendix is segmentally visualized and appears normal. No pelvic mass or lymphadenopathy. Mild degenerative changes in the spine. No destructive bone lesions.  IMPRESSION: Dilatation of fluid-filled small bowel with transition zone in the right lower quadrant consistent with partial obstruction. Mild to moderate right abdominal and pelvic ascites. 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 Distended gallbladder with mild bile duct dilatation. Stones seen a previous ultrasound are not visualized and are likely non radiopaque. Small esophageal hiatal hernia. Bronchiectasis in the lung bases.   Electronically Signed   By: Lucienne Capers M.D.   On: 06/30/2014 22:11   Nm Pet Image Initial (pi) Skull Base To Thigh  07/08/2014    CLINICAL DATA:  Initial treatment strategy for neuroendocrine tumor.  EXAM: NUCLEAR MEDICINE PET SKULL BASE TO THIGH  TECHNIQUE: 9.3 mCi F-18 FDG was injected intravenously. Full-ring PET imaging was performed from the skull base to thigh after the radiotracer. CT data was obtained and used for attenuation correction and anatomic localization.  FASTING BLOOD GLUCOSE:  Value: 104 mg/dl  COMPARISON:  CT abdomen pelvis 06/30/2014  FINDINGS: NECK  No hypermetabolic lymph nodes in the neck.  CHEST  Hypermetabolic nodule within the left upper lobe measures 14 mm (image 84, series 4) with SUV max equal 12.2. Cluster of smaller nodules in the right lower lobe are with SUV max equal 3.8 (image 86, series 4.  ABDOMEN/PELVIS  There are multiple foci of hypermetabolic activity associated with asymmetric bowel wall thickening. This is most suggestive serosal implants onto the small bowel and colon. Example lesion in the right lower quadrant associated with the descending colon (image 185, series 4) with SUV max equal 7.5. Several lesions associated with the small bowel in the upper pelvis on image 178 of the fused data set. There is thickening along the hepatic flexure of the colon with hypermetabolic activity (image 989, series 4).  There is hypermetabolic activity associated with thickening at the terminal ileum. This could indicate primary site. (Image 153 of the fused data set)  There is a discrete lesion within the inferior right hepatic lobe adjacent t othe gallbladder fossa with SUV max equal 7.3. This is identified in retrospect on the comparison contrast CT. There is a large gallstone within the lumen of the gallbladder. No abnormal gallbladder activity. There is a hypermetabolic periportal lymph node. No discrete abnormal metabolic activity associated with the pancreas.  SKELETON  No focal hypermetabolic activity to suggest skeletal metastasis.  IMPRESSION: 1. Multiple sites of asymmetric hypermetabolic bowel wall  thickening within the large and small bowel are most consistent with serosal implants from carcinomatosis. 2. Thickening and metabolic activity at the terminal ileum could represent the primary site neoplasm. 3. Solitary metastasis to the inferior right hepatic lobe. 4. No abnormal metabolic activity within the pancreas or gallbladder. 5. Pulmonary metastasis within the left upper lobe and right lower lobe. 6. Moderate volume of ascites and small right pleural effusion.   Electronically Signed   By: Suzy Bouchard M.D.   On: 07/08/2014 08:22   Ct Biopsy  07/01/2014   CLINICAL DATA:  Bowel obstruction, ascites, nodular omental disease.  EXAM: CT GUIDED CORE BIOPSY OF OMENTUM  ANESTHESIA/SEDATION: Intravenous Fentanyl and Versed were administered as conscious sedation during continuous cardiorespiratory monitoring by the radiology RN, with a total moderate sedation time of 6 minutes.  PROCEDURE: The procedure risks, benefits, and alternatives were explained to the  patient. Questions regarding the procedure were encouraged and answered. The patient understands and consents to the procedure.  Patient placed supine. Limited axial scans through the abdomen were obtained and an appropriate skin entry site was determined. Site was marked.  The operative field was prepped with Betadinein a sterile fashion, and a sterile drape was applied covering the operative field. A sterile gown and sterile gloves were used for the procedure. Local anesthesia was provided with 1% Lidocaine.  Under CT fluoroscopic guidance, a 17 gauge trocar needle was advanced to the margin of the lesion. Once needle tip position was confirmed, coaxial 18-gauge core biopsy samples were obtained, submitted in formalin to surgical pathology. The guide needle was removed. Postprocedure scans show no hemorrhage or other apparent complication.  COMPLICATIONS: None immediate  FINDINGS: Limited scans through the mid abdomen again demonstrate nodular omental  disease and a small amount of scattered abdominal ascites. Multiple distended small bowel loops are identified. Oral contrast material is in the decompressed colon.  CT guided core biopsy of nodular omental disease was performed without complication.  IMPRESSION: 1. Technically successful CT-guided core biopsy of nodular omental disease.   Electronically Signed   By: Lucrezia Europe M.D.   On: 07/01/2014 14:50    Labs:  CBC:  Recent Labs  07/01/14 0104 07/01/14 1020 07/10/14 1102 07/14/14 1055  WBC 9.5 7.6 5.5 7.2  HGB 10.1* 9.5* 8.9* 9.9*  HCT 31.0* 30.1* 28.1* 32.3*  PLT 299 285 270 338    COAGS: No results for input(s): INR, APTT in the last 8760 hours.  BMP:  Recent Labs  06/30/14 1940 07/01/14 0104 07/01/14 1020  NA 137  --  138  K 4.5  --  4.3  CL 102  --  108  CO2 27  --  27  GLUCOSE 124*  --  118*  BUN 17  --  15  CALCIUM 9.8  --  8.2*  CREATININE 1.00 0.92 0.92  GFRNONAA 76* 85* 85*  GFRAA 88* >90 >90    LIVER FUNCTION TESTS:  Recent Labs  06/30/14 1940 07/01/14 1020  BILITOT 0.5 0.5  AST 28 23  ALT 22 19  ALKPHOS 69 56  PROT 7.6 5.8*  ALBUMIN 4.3 3.3*    TUMOR MARKERS:  Recent Labs  07/01/14 1020  CEA 3.0  CA199 10    Assessment and Plan: MARCIO HOQUE is a 69 y.o. 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 All of the patient's questions were answered, patient is agreeable to proceed. Consent signed and in chart.     Signed: Autumn Messing 07/14/2014, 12:04 PM   I spent a total of 20 minutes face to face in clinical consultation, greater than 50% of which was counseling/coordinating care for Port-A-Cath placement

## 2014-07-14 NOTE — Discharge Instructions (Signed)
Leave dressing on for 24 hours.  You may shower after 24 hours.  Please remove the dressing before you shower.   ° ° °Implanted Port Insertion, Care After °Refer to this sheet in the next few weeks. These instructions provide you with information on caring for yourself after your procedure. Your health care provider may also give you more specific instructions. Your treatment has been planned according to current medical practices, but problems sometimes occur. Call your health care provider if you have any problems or questions after your procedure. °WHAT TO EXPECT AFTER THE PROCEDURE °After your procedure, it is typical to have the following:  °· Discomfort at the port insertion site. Ice packs to the area will help. °· Bruising on the skin over the port. This will subside in 3-4 days. °HOME CARE INSTRUCTIONS °· After your port is placed, you will get a manufacturer's information card. The card has information about your port. Keep this card with you at all times.   °· Know what kind of port you have. There are many types of ports available.   °· Wear a medical alert bracelet in case of an emergency. This can help alert health care workers that you have a port.   °· The port can stay in for as long as your health care provider believes it is necessary.   °· A home health care nurse may give medicines and take care of the port.   °· You or a family member can get special training and directions for giving medicine and taking care of the port at home.   °SEEK MEDICAL CARE IF:  °· Your port does not flush or you are unable to get a blood return.   °· You have a fever or chills. °SEEK IMMEDIATE MEDICAL CARE IF: °· You have new fluid or pus coming from your incision.   °· You notice a bad smell coming from your incision site.   °· You have swelling, pain, or more redness at the incision or port site.   °· You have chest pain or shortness of breath. °Document Released: 01/29/2013 Document Revised: 04/15/2013 Document  Reviewed: 01/29/2013 °ExitCare® Patient Information ©2015 [employer]. This information is not intended to replace advice given to you by your health care provider. Make sure you discuss any questions you have with your health care provider. °Implanted Port Home Guide °An implanted port is a type of central line that is placed under the skin. Central lines are used to provide IV access when treatment or nutrition needs to be given through a person's veins. Implanted ports are used for long-term IV access. An implanted port may be placed because:  °· You need IV medicine that would be irritating to the small veins in your hands or arms.   °· You need long-term IV medicines, such as antibiotics.   °· You need IV nutrition for a long period.   °· You need frequent blood draws for lab tests.   °· You need dialysis.   °Implanted ports are usually placed in the chest area, but they can also be placed in the upper arm, the abdomen, or the leg. An implanted port has two main parts:  °· Reservoir. The reservoir is round and will appear as a small, raised area under your skin. The reservoir is the part where a needle is inserted to give medicines or draw blood.   °· Catheter. The catheter is a thin, flexible tube that extends from the reservoir. The catheter is placed into a large vein. Medicine that is inserted into the reservoir goes into   the catheter and then into the vein.   °HOW WILL I CARE FOR MY INCISION SITE? °Do not get the incision site wet. Bathe or shower as directed by your health care provider.  °HOW IS MY PORT ACCESSED? °Special steps must be taken to access the port:  °· Before the port is accessed, a numbing cream can be placed on the skin. This helps numb the skin over the port site.   °· Your health care provider uses a sterile technique to access the port. °· Your health care provider must put on a mask and sterile gloves. °· The skin over your port is cleaned carefully with an antiseptic and allowed to  dry. °· The port is gently pinched between sterile gloves, and a needle is inserted into the port. °· Only "non-coring" port needles should be used to access the port. Once the port is accessed, a blood return should be checked. This helps ensure that the port is in the vein and is not clogged.   °· If your port needs to remain accessed for a constant infusion, a clear (transparent) bandage will be placed over the needle site. The bandage and needle will need to be changed every week, or as directed by your health care provider.   °· Keep the bandage covering the needle clean and dry. Do not get it wet. Follow your health care provider's instructions on how to take a shower or bath while the port is accessed.   °· If your port does not need to stay accessed, no bandage is needed over the port.   °WHAT IS FLUSHING? °Flushing helps keep the port from getting clogged. Follow your health care provider's instructions on how and when to flush the port. Ports are usually flushed with saline solution or a medicine called heparin. The need for flushing will depend on how the port is used.  °· If the port is used for intermittent medicines or blood draws, the port will need to be flushed:   °· After medicines have been given.   °· After blood has been drawn.   °· As part of routine maintenance.   °· If a constant infusion is running, the port may not need to be flushed.   °HOW LONG WILL MY PORT STAY IMPLANTED? °The port can stay in for as long as your health care provider thinks it is needed. When it is time for the port to come out, surgery will be done to remove it. The procedure is similar to the one performed when the port was put in.  °WHEN SHOULD I SEEK IMMEDIATE MEDICAL CARE? °When you have an implanted port, you should seek immediate medical care if:  °· You notice a bad smell coming from the incision site.   °· You have swelling, redness, or drainage at the incision site.   °· You have more swelling or pain at the  port site or the surrounding area.   °· You have a fever that is not controlled with medicine. °Document Released: 04/10/2005 Document Revised: 01/29/2013 Document Reviewed: 12/16/2012 °ExitCare® Patient Information ©2015 [employer]. This information is not intended to replace advice given to you by your health care provider. Make sure you discuss any questions you have with your health care provider. °Conscious Sedation, Adult, Care After °Refer to this sheet in the next few weeks. These instructions provide you with information on caring for yourself after your procedure. Your health care provider may also give you more specific instructions. Your treatment has been planned according to current medical practices, but problems sometimes occur.   Call your health care provider if you have any problems or questions after your procedure. °WHAT TO EXPECT AFTER THE PROCEDURE  °After your procedure: °· You may feel sleepy, clumsy, and have poor balance for several hours. °· Vomiting may occur if you eat too soon after the procedure. °HOME CARE INSTRUCTIONS °· Do not participate in any activities where you could become injured for at least 24 hours. Do not: °¨ Drive. °¨ Swim. °¨ Ride a bicycle. °¨ Operate heavy machinery. °¨ Cook. °¨ Use power tools. °¨ Climb ladders. °¨ Work from a high place. °· Do not make important decisions or sign legal documents until you are improved. °· If you vomit, drink water, juice, or soup when you can drink without vomiting. Make sure you have little or no nausea before eating solid foods. °· Only take over-the-counter or prescription medicines for pain, discomfort, or fever as directed by your health care provider. °· Make sure you and your family fully understand everything about the medicines given to you, including what side effects may occur. °· You should not drink alcohol, take sleeping pills, or take medicines that cause drowsiness for at least 24 hours. °· If you smoke, do not  smoke without supervision. °· If you are feeling better, you may resume normal activities 24 hours after you were sedated. °· Keep all appointments with your health care provider. °SEEK MEDICAL CARE IF: °· Your skin is pale or bluish in color. °· You continue to feel nauseous or vomit. °· Your pain is getting worse and is not helped by medicine. °· You have bleeding or swelling. °· You are still sleepy or feeling clumsy after 24 hours. °SEEK IMMEDIATE MEDICAL CARE IF: °· You develop a rash. °· You have difficulty breathing. °· You develop any type of allergic problem. °· You have a fever. °MAKE SURE YOU: °· Understand these instructions. °· Will watch your condition. °· Will get help right away if you are not doing well or get worse. °Document Released: 01/29/2013 Document Reviewed: 01/29/2013 °ExitCare® Patient Information ©2015 [employer]. This information is not intended to replace advice given to you by your health care provider. Make sure you discuss any questions you have with your health care provider. ° ° ° °

## 2014-07-14 NOTE — Procedures (Signed)
Placement of right jugular portacath.  Tip at SVC/RA junction.  Minimal blood loss.  No immediate complication.   

## 2014-07-15 ENCOUNTER — Ambulatory Visit (HOSPITAL_BASED_OUTPATIENT_CLINIC_OR_DEPARTMENT_OTHER): Payer: 59

## 2014-07-15 DIAGNOSIS — Z5111 Encounter for antineoplastic chemotherapy: Secondary | ICD-10-CM

## 2014-07-15 DIAGNOSIS — C786 Secondary malignant neoplasm of retroperitoneum and peritoneum: Secondary | ICD-10-CM | POA: Diagnosis not present

## 2014-07-15 DIAGNOSIS — C801 Malignant (primary) neoplasm, unspecified: Secondary | ICD-10-CM | POA: Diagnosis not present

## 2014-07-15 MED ORDER — FLUOROURACIL CHEMO INJECTION 2.5 GM/50ML
400.0000 mg/m2 | Freq: Once | INTRAVENOUS | Status: AC
  Administered 2014-07-15: 850 mg via INTRAVENOUS
  Filled 2014-07-15: qty 17

## 2014-07-15 MED ORDER — LEUCOVORIN CALCIUM INJECTION 350 MG
406.0000 mg/m2 | Freq: Once | INTRAVENOUS | Status: AC
  Administered 2014-07-15: 840 mg via INTRAVENOUS
  Filled 2014-07-15: qty 42

## 2014-07-15 MED ORDER — SODIUM CHLORIDE 0.9 % IV SOLN
Freq: Once | INTRAVENOUS | Status: AC
  Administered 2014-07-15: 09:00:00 via INTRAVENOUS
  Filled 2014-07-15: qty 4

## 2014-07-15 MED ORDER — OXALIPLATIN CHEMO INJECTION 100 MG/20ML
85.0000 mg/m2 | Freq: Once | INTRAVENOUS | Status: AC
  Administered 2014-07-15: 175 mg via INTRAVENOUS
  Filled 2014-07-15: qty 35

## 2014-07-15 MED ORDER — DEXTROSE 5 % IV SOLN
Freq: Once | INTRAVENOUS | Status: AC
  Administered 2014-07-15: 09:00:00 via INTRAVENOUS

## 2014-07-15 MED ORDER — SODIUM CHLORIDE 0.9 % IV SOLN
2400.0000 mg/m2 | INTRAVENOUS | Status: DC
  Administered 2014-07-15: 4950 mg via INTRAVENOUS
  Filled 2014-07-15: qty 99

## 2014-07-15 NOTE — Progress Notes (Signed)
Patient brought in [place] - to HIM for scanning

## 2014-07-15 NOTE — Patient Instructions (Signed)
[facility] Discharge Instructions for Patients Receiving Chemotherapy  Today you received the following chemotherapy agents oxaliplatin, leucovorin and 36fu.  To help prevent nausea and vomiting after your treatment, we encourage you to take your nausea medication.   If you develop nausea and vomiting that is not controlled by your nausea medication, call the clinic.   BELOW ARE SYMPTOMS THAT SHOULD BE REPORTED IMMEDIATELY:  *FEVER GREATER THAN 100.5 F  *CHILLS WITH OR WITHOUT FEVER  NAUSEA AND VOMITING THAT IS NOT CONTROLLED WITH YOUR NAUSEA MEDICATION  *UNUSUAL SHORTNESS OF BREATH  *UNUSUAL BRUISING OR BLEEDING  TENDERNESS IN MOUTH AND THROAT WITH OR WITHOUT PRESENCE OF ULCERS  *URINARY PROBLEMS  *BOWEL PROBLEMS  UNUSUAL RASH Items with * indicate a potential emergency and should be followed up as soon as possible.  Feel free to call the clinic you have any questions or concerns. The clinic phone number is [phone].  Please show the [facility] at check-in to the Emergency Department and triage nurse.

## 2014-07-17 ENCOUNTER — Ambulatory Visit (HOSPITAL_BASED_OUTPATIENT_CLINIC_OR_DEPARTMENT_OTHER): Payer: 59

## 2014-07-17 ENCOUNTER — Telehealth: Payer: Self-pay

## 2014-07-17 DIAGNOSIS — C801 Malignant (primary) neoplasm, unspecified: Secondary | ICD-10-CM

## 2014-07-17 DIAGNOSIS — C481 Malignant neoplasm of specified parts of peritoneum: Secondary | ICD-10-CM | POA: Diagnosis not present

## 2014-07-17 DIAGNOSIS — C787 Secondary malignant neoplasm of liver and intrahepatic bile duct: Secondary | ICD-10-CM | POA: Diagnosis not present

## 2014-07-17 MED ORDER — HEPARIN SOD (PORK) LOCK FLUSH 100 UNIT/ML IV SOLN
500.0000 [IU] | Freq: Once | INTRAVENOUS | Status: AC | PRN
  Administered 2014-07-17: 500 [IU]
  Filled 2014-07-17: qty 5

## 2014-07-17 MED ORDER — SODIUM CHLORIDE 0.9 % IJ SOLN
10.0000 mL | INTRAMUSCULAR | Status: DC | PRN
Start: 2014-07-17 — End: 2014-07-17
  Administered 2014-07-17: 10 mL
  Filled 2014-07-17: qty 10

## 2014-07-17 NOTE — Patient Instructions (Signed)
Fluorouracil, 5FU; Diclofenac topical cream What is this medicine? FLUOROURACIL; DICLOFENAC (flure oh YOOR a sil; dye KLOE fen ak) is a combination of a topical chemotherapy agent and non-steroidal anti-inflammatory drug (NSAID). It is used on the skin to treat skin cancer and skin conditions that could become cancer. This medicine may be used for other purposes; ask your health care provider or pharmacist if you have questions. COMMON BRAND NAME(S): FLUORAC What should I tell my health care provider before I take this medicine? They need to know if you have any of these conditions: -bleeding problems -cigarette smoker -DPD enzyme deficiency -heart disease -high blood pressure -if you frequently drink alcohol containing drinks -kidney disease -liver disease -open or infected skin -stomach problems -swelling or open sores at the treatment site -recent or planned coronary artery bypass graft (CABG) surgery -an unusual or allergic reaction to fluorouracil, diclofenac, aspirin, other NSAIDs, other medicines, foods, dyes, or preservatives -pregnant or trying to get pregnant -breast-feeding How should I use this medicine? This medicine is only for use on the skin. Follow the directions on the prescription label. Wash hands before and after use. Wash affected area and gently pat dry. To apply this medicine use a cotton-tipped applicator, or use gloves if applying with fingertips. If applied with unprotected fingertips, it is very important to wash your hands well after you apply this medicine. Avoid applying to the eyes, nose, or mouth. Apply enough medicine to cover the affected area. You can cover the area with a light gauze dressing, but do not use tight or air-tight dressings. Finish the full course prescribed by your doctor or health care professional, even if you think your condition is better. Do not stop taking except on the advice of your doctor or health care professional. Talk to your  pediatrician regarding the use of this medicine in children. Special care may be needed. Overdosage: If you think you've taken too much of this medicine contact a poison control center or emergency room at once. Overdosage: If you think you have taken too much of this medicine contact a poison control center or emergency room at once. NOTE: This medicine is only for you. Do not share this medicine with others. What if I miss a dose? If you miss a dose, apply it as soon as you can. If it is almost time for your next dose, only use that dose. Do not apply extra doses. Contact your doctor or health care professional if you miss more than one dose. What may interact with this medicine? Interactions are not expected. Do not use any other skin products without telling your doctor or health care professional. This list may not describe all possible interactions. Give your health care provider a list of all the medicines, herbs, non-prescription drugs, or dietary supplements you use. Also tell them if you smoke, drink alcohol, or use illegal drugs. Some items may interact with your medicine. What should I watch for while using this medicine? Visit your doctor or health care professional for checks on your progress. You will need to use this medicine for 2 to 6 weeks. This may be longer depending on the condition being treated. You may not see full healing for another 1 to 2 months after you stop using the medicine. Treated areas of skin can look unsightly during and for several weeks after treatment with this medicine. This medicine can make you more sensitive to the sun. Keep out of the sun. If you cannot avoid being in   the sun, wear protective clothing and use sunscreen. Do not use sun lamps or tanning beds/booths. Where should I keep my What side effects may I notice from receiving this medicine? Side effects that you should report to your doctor or health care professional as soon as possible: -allergic  reactions like skin rash, itching or hives, swelling of the face, lips, or tongue -black or bloody stools, blood in the urine or vomit -blurred vision -chest pain -difficulty breathing or wheezing -redness, blistering, peeling or loosening of the skin, including inside the mouth -severe redness and swelling of normal skin -slurred speech or weakness on one side of the body -trouble passing urine or change in the amount of urine -unexplained weight gain or swelling -unusually weak or tired -yellowing of eyes or skin Side effects that usually do not require medical attention (Report these to your doctor or health care professional if they continue or are bothersome.): -increased sensitivity of the skin to sun and ultraviolet light -pain and burning of the affected area -scaling or swelling of the affected area -skin rash, itching of the affected area -tenderness This list may not describe all possible side effects. Call your doctor for medical advice about side effects. You may report side effects to FDA at 1-800-FDA-1088. Where should I keep my medicine? Keep out of the reach of children. Store at room temperature between 20 and 25 degrees C (68 and 77 degrees F). Throw away any unused medicine after the expiration date. NOTE: This sheet is a summary. It may not cover all possible information. If you have questions about this medicine, talk to your doctor, pharmacist, or health care provider.  2015, [employer]. (2013-08-11 11:09:58)  

## 2014-07-17 NOTE — Telephone Encounter (Addendum)
[name] called stating that the FMLA forms need to be faxed to [employer]   Attention: Lorelee Market. Will give a copy of this message to Thermon Leyland to fax papers. Told [name] that a copy of the FMLA papers are up front for him to pick up so he will have a copy for his records.

## 2014-07-20 ENCOUNTER — Telehealth: Payer: Self-pay

## 2014-07-20 ENCOUNTER — Encounter: Payer: Self-pay | Admitting: Oncology

## 2014-07-20 NOTE — Progress Notes (Signed)
I called and left message at home/cell ph#s for call back. Patient wants forms faxed to [employer]. I need the fax# to fax for him.

## 2014-07-20 NOTE — Telephone Encounter (Signed)
Fax from [place] one received, placed on Dr. Bernette Redbird desk for appt 4/5

## 2014-07-21 ENCOUNTER — Encounter: Payer: Self-pay | Admitting: Oncology

## 2014-07-21 NOTE — Progress Notes (Signed)
I called the patient back and he gave me the [employer] ph# to fax to [facility] [phone]. I faxed to her.

## 2014-07-26 ENCOUNTER — Other Ambulatory Visit: Payer: Self-pay | Admitting: Oncology

## 2014-07-27 ENCOUNTER — Encounter (HOSPITAL_COMMUNITY): Payer: Self-pay

## 2014-07-28 ENCOUNTER — Telehealth: Payer: Self-pay | Admitting: *Deleted

## 2014-07-28 ENCOUNTER — Ambulatory Visit (HOSPITAL_BASED_OUTPATIENT_CLINIC_OR_DEPARTMENT_OTHER): Payer: 59 | Admitting: Oncology

## 2014-07-28 ENCOUNTER — Telehealth: Payer: Self-pay | Admitting: Oncology

## 2014-07-28 ENCOUNTER — Other Ambulatory Visit (HOSPITAL_BASED_OUTPATIENT_CLINIC_OR_DEPARTMENT_OTHER): Payer: 59

## 2014-07-28 ENCOUNTER — Ambulatory Visit (HOSPITAL_BASED_OUTPATIENT_CLINIC_OR_DEPARTMENT_OTHER): Payer: 59

## 2014-07-28 VITALS — BP 109/56 | HR 64 | Temp 97.0°F | Resp 19 | Ht 73.0 in | Wt 181.8 lb

## 2014-07-28 DIAGNOSIS — C801 Malignant (primary) neoplasm, unspecified: Secondary | ICD-10-CM

## 2014-07-28 DIAGNOSIS — C7989 Secondary malignant neoplasm of other specified sites: Secondary | ICD-10-CM

## 2014-07-28 DIAGNOSIS — E611 Iron deficiency: Secondary | ICD-10-CM

## 2014-07-28 DIAGNOSIS — Z5111 Encounter for antineoplastic chemotherapy: Secondary | ICD-10-CM | POA: Diagnosis not present

## 2014-07-28 DIAGNOSIS — C800 Disseminated malignant neoplasm, unspecified: Secondary | ICD-10-CM

## 2014-07-28 LAB — COMPREHENSIVE METABOLIC PANEL (CC13)
ALBUMIN: 3.3 g/dL — AB (ref 3.5–5.0)
ALT: 13 U/L (ref 0–55)
AST: 22 U/L (ref 5–34)
Alkaline Phosphatase: 73 U/L (ref 40–150)
Anion Gap: 10 mEq/L (ref 3–11)
BUN: 9.7 mg/dL (ref 7.0–26.0)
CALCIUM: 8.6 mg/dL (ref 8.4–10.4)
CO2: 25 meq/L (ref 22–29)
Chloride: 103 mEq/L (ref 98–109)
Creatinine: 0.8 mg/dL (ref 0.7–1.3)
Glucose: 110 mg/dl (ref 70–140)
Potassium: 4.4 mEq/L (ref 3.5–5.1)
SODIUM: 138 meq/L (ref 136–145)
Total Bilirubin: 0.27 mg/dL (ref 0.20–1.20)
Total Protein: 6.2 g/dL — ABNORMAL LOW (ref 6.4–8.3)

## 2014-07-28 LAB — CBC WITH DIFFERENTIAL/PLATELET
BASO%: 0.4 % (ref 0.0–2.0)
Basophils Absolute: 0 10*3/uL (ref 0.0–0.1)
EOS%: 3.5 % (ref 0.0–7.0)
Eosinophils Absolute: 0.2 10*3/uL (ref 0.0–0.5)
HEMATOCRIT: 27.1 % — AB (ref 38.4–49.9)
HGB: 8.7 g/dL — ABNORMAL LOW (ref 13.0–17.1)
LYMPH#: 1.1 10*3/uL (ref 0.9–3.3)
LYMPH%: 22.8 % (ref 14.0–49.0)
MCH: 28.5 pg (ref 27.2–33.4)
MCHC: 32.1 g/dL (ref 32.0–36.0)
MCV: 88.9 fL (ref 79.3–98.0)
MONO#: 0.7 10*3/uL (ref 0.1–0.9)
MONO%: 15 % — AB (ref 0.0–14.0)
NEUT#: 2.7 10*3/uL (ref 1.5–6.5)
NEUT%: 58.3 % (ref 39.0–75.0)
Platelets: 185 10*3/uL (ref 140–400)
RBC: 3.05 10*6/uL — AB (ref 4.20–5.82)
RDW: 14.5 % (ref 11.0–14.6)
WBC: 4.6 10*3/uL (ref 4.0–10.3)

## 2014-07-28 MED ORDER — LEUCOVORIN CALCIUM INJECTION 350 MG
406.0000 mg/m2 | Freq: Once | INTRAVENOUS | Status: AC
  Administered 2014-07-28: 840 mg via INTRAVENOUS
  Filled 2014-07-28: qty 42

## 2014-07-28 MED ORDER — DEXTROSE 5 % IV SOLN
85.0000 mg/m2 | Freq: Once | INTRAVENOUS | Status: AC
  Administered 2014-07-28: 175 mg via INTRAVENOUS
  Filled 2014-07-28: qty 35

## 2014-07-28 MED ORDER — FLUOROURACIL CHEMO INJECTION 2.5 GM/50ML
400.0000 mg/m2 | Freq: Once | INTRAVENOUS | Status: AC
  Administered 2014-07-28: 850 mg via INTRAVENOUS
  Filled 2014-07-28: qty 17

## 2014-07-28 MED ORDER — OXYCODONE HCL 5 MG PO TABS: 5.0000 mg | ORAL_TABLET | ORAL | Status: DC | PRN

## 2014-07-28 MED ORDER — SODIUM CHLORIDE 0.9 % IV SOLN
2400.0000 mg/m2 | INTRAVENOUS | Status: DC
  Administered 2014-07-28: 4950 mg via INTRAVENOUS
  Filled 2014-07-28: qty 99

## 2014-07-28 MED ORDER — PALONOSETRON HCL INJECTION 0.25 MG/5ML
INTRAVENOUS | Status: AC
  Filled 2014-07-28: qty 5

## 2014-07-28 MED ORDER — PALONOSETRON HCL INJECTION 0.25 MG/5ML
0.2500 mg | Freq: Once | INTRAVENOUS | Status: AC
Start: 2014-07-28 — End: 2014-07-28
  Administered 2014-07-28: 0.25 mg via INTRAVENOUS

## 2014-07-28 MED ORDER — ONDANSETRON 4 MG PO TBDP: 4.0000 mg | ORAL_TABLET | ORAL | Status: DC | PRN

## 2014-07-28 MED ORDER — SODIUM CHLORIDE 0.9 % IV SOLN
Freq: Once | INTRAVENOUS | Status: AC
  Administered 2014-07-28: 11:00:00 via INTRAVENOUS
  Filled 2014-07-28: qty 5

## 2014-07-28 MED ORDER — DEXTROSE 5 % IV SOLN
Freq: Once | INTRAVENOUS | Status: AC
  Administered 2014-07-28: 11:00:00 via INTRAVENOUS

## 2014-07-28 NOTE — Progress Notes (Signed)
  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 He reports nausea on the day of chemotherapy. He had diarrhea during the 5 fluorouracil infusion up to 4 times per day. No neuropathy symptoms. The abdominal discomfort is "70% "better, but he continues to have intermittent episodes of severe abdominal pain. He is having bowel movements. He has been active walking.  Objective:  Vital signs in last 24 hours:  Blood pressure 109/56, pulse 64, temperature 97 F (36.1 C), temperature source Oral, resp. rate 19, height 6\' 1"  (1.854 m), weight 181 lb 12.8 oz (82.464 kg), SpO2 96 %.    HEENT: No thrush or ulcers Resp: Lungs clear bilaterally Cardio: Grouped beats GI: Nontender, no mass, no hepatomegaly, mildly distended Vascular: No leg edema   Portacath/PICC-without erythema  Lab Results:  Lab Results  Component Value Date   WBC 7.2 07/14/2014   HGB 9.9* 07/14/2014   HCT 32.3* 07/14/2014   MCV 93.9 07/14/2014   PLT 338 07/14/2014   NEUTROABS 3.8 07/10/2014     Medications: I have reviewed the patient's current medications.  Assessment/Plan: 1. 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 Abdominal pain secondary to #1 3. Iron deficiency, Hemoccult positive stool 4. Partial small  bowel obstruction-clinically improved 5. Bronchiectasis 6. 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 We will adjust the antiemetic regimen with cycle 2. He will try oxycodone for pain.  [name] will return for an office visit and chemotherapy in 2 weeks. The plan is to complete 5 cycles of FOLFOX prior to a restaging CT.  Betsy Coder, MD  07/28/2014  9:12 AM

## 2014-07-28 NOTE — Telephone Encounter (Signed)
Per staff message and POF I have scheduled appts. Advised scheduler of appts. JMW  

## 2014-07-28 NOTE — Patient Instructions (Signed)
[facility] Discharge Instructions for Patients Receiving Chemotherapy  Today you received the following chemotherapy agents Oxaliplation, Leucovorin, and 5FU  To help prevent nausea and vomiting after your treatment, we encourage you to take your nausea medication Compazine 10 mg every 6 hours or Zofran 4 mg every 4 hours as needed.   If you develop nausea and vomiting that is not controlled by your nausea medication, call the clinic.   BELOW ARE SYMPTOMS THAT SHOULD BE REPORTED IMMEDIATELY:  *FEVER GREATER THAN 100.5 F  *CHILLS WITH OR WITHOUT FEVER  NAUSEA AND VOMITING THAT IS NOT CONTROLLED WITH YOUR NAUSEA MEDICATION  *UNUSUAL SHORTNESS OF BREATH  *UNUSUAL BRUISING OR BLEEDING  TENDERNESS IN MOUTH AND THROAT WITH OR WITHOUT PRESENCE OF ULCERS  *URINARY PROBLEMS  *BOWEL PROBLEMS  UNUSUAL RASH Items with * indicate a potential emergency and should be followed up as soon as possible.  Feel free to call the clinic you have any questions or concerns. The clinic phone number is [phone].  Please show the [facility] at check-in to the Emergency Department and triage nurse.

## 2014-07-28 NOTE — Telephone Encounter (Signed)
Gave avs & calendar for April/May. Sent message to schedule treatment. °

## 2014-07-30 ENCOUNTER — Ambulatory Visit (HOSPITAL_BASED_OUTPATIENT_CLINIC_OR_DEPARTMENT_OTHER): Payer: 59

## 2014-07-30 DIAGNOSIS — Z452 Encounter for adjustment and management of vascular access device: Secondary | ICD-10-CM

## 2014-07-30 DIAGNOSIS — C801 Malignant (primary) neoplasm, unspecified: Secondary | ICD-10-CM

## 2014-07-30 MED ORDER — SODIUM CHLORIDE 0.9 % IJ SOLN
10.0000 mL | INTRAMUSCULAR | Status: DC | PRN
  Administered 2014-07-30: 10 mL
  Filled 2014-07-30: qty 10

## 2014-07-30 MED ORDER — HEPARIN SOD (PORK) LOCK FLUSH 100 UNIT/ML IV SOLN
500.0000 [IU] | Freq: Once | INTRAVENOUS | Status: AC | PRN
  Administered 2014-07-30: 500 [IU]
  Filled 2014-07-30: qty 5

## 2014-07-30 NOTE — Patient Instructions (Signed)

## 2014-08-05 ENCOUNTER — Other Ambulatory Visit: Payer: Self-pay | Admitting: *Deleted

## 2014-08-09 ENCOUNTER — Other Ambulatory Visit: Payer: Self-pay | Admitting: Oncology

## 2014-08-11 ENCOUNTER — Telehealth: Payer: Self-pay | Admitting: Oncology

## 2014-08-11 ENCOUNTER — Encounter: Payer: Self-pay | Admitting: *Deleted

## 2014-08-11 ENCOUNTER — Ambulatory Visit (HOSPITAL_BASED_OUTPATIENT_CLINIC_OR_DEPARTMENT_OTHER): Payer: 59 | Admitting: Oncology

## 2014-08-11 ENCOUNTER — Ambulatory Visit (HOSPITAL_BASED_OUTPATIENT_CLINIC_OR_DEPARTMENT_OTHER): Payer: 59

## 2014-08-11 ENCOUNTER — Other Ambulatory Visit: Payer: Self-pay | Admitting: *Deleted

## 2014-08-11 ENCOUNTER — Other Ambulatory Visit (HOSPITAL_BASED_OUTPATIENT_CLINIC_OR_DEPARTMENT_OTHER): Payer: 59

## 2014-08-11 ENCOUNTER — Ambulatory Visit: Payer: 59

## 2014-08-11 VITALS — BP 107/60 | HR 65 | Temp 98.4°F | Resp 18 | Ht 73.0 in | Wt 181.8 lb

## 2014-08-11 DIAGNOSIS — C786 Secondary malignant neoplasm of retroperitoneum and peritoneum: Secondary | ICD-10-CM

## 2014-08-11 DIAGNOSIS — Z5111 Encounter for antineoplastic chemotherapy: Secondary | ICD-10-CM | POA: Diagnosis not present

## 2014-08-11 DIAGNOSIS — Z95828 Presence of other vascular implants and grafts: Secondary | ICD-10-CM

## 2014-08-11 DIAGNOSIS — C801 Malignant (primary) neoplasm, unspecified: Secondary | ICD-10-CM

## 2014-08-11 DIAGNOSIS — D509 Iron deficiency anemia, unspecified: Secondary | ICD-10-CM | POA: Diagnosis not present

## 2014-08-11 LAB — CBC WITH DIFFERENTIAL/PLATELET
BASO%: 0.2 % (ref 0.0–2.0)
BASOS ABS: 0 10*3/uL (ref 0.0–0.1)
EOS%: 1 % (ref 0.0–7.0)
Eosinophils Absolute: 0 10*3/uL (ref 0.0–0.5)
HEMATOCRIT: 25.6 % — AB (ref 38.4–49.9)
HGB: 8.1 g/dL — ABNORMAL LOW (ref 13.0–17.1)
LYMPH%: 21.6 % (ref 14.0–49.0)
MCH: 27.5 pg (ref 27.2–33.4)
MCHC: 31.6 g/dL — ABNORMAL LOW (ref 32.0–36.0)
MCV: 86.8 fL (ref 79.3–98.0)
MONO#: 0.7 10*3/uL (ref 0.1–0.9)
MONO%: 18.4 % — AB (ref 0.0–14.0)
NEUT#: 2.4 10*3/uL (ref 1.5–6.5)
NEUT%: 58.8 % (ref 39.0–75.0)
Platelets: 185 10*3/uL (ref 140–400)
RBC: 2.95 10*6/uL — ABNORMAL LOW (ref 4.20–5.82)
RDW: 15.1 % — AB (ref 11.0–14.6)
WBC: 4 10*3/uL (ref 4.0–10.3)
lymph#: 0.9 10*3/uL (ref 0.9–3.3)

## 2014-08-11 LAB — COMPREHENSIVE METABOLIC PANEL (CC13)
ALK PHOS: 83 U/L (ref 40–150)
ALT: 13 U/L (ref 0–55)
AST: 23 U/L (ref 5–34)
Albumin: 3.1 g/dL — ABNORMAL LOW (ref 3.5–5.0)
Anion Gap: 12 mEq/L — ABNORMAL HIGH (ref 3–11)
BUN: 9.5 mg/dL (ref 7.0–26.0)
CO2: 21 mEq/L — ABNORMAL LOW (ref 22–29)
Calcium: 8.3 mg/dL — ABNORMAL LOW (ref 8.4–10.4)
Chloride: 103 mEq/L (ref 98–109)
Creatinine: 0.7 mg/dL (ref 0.7–1.3)
GLUCOSE: 107 mg/dL (ref 70–140)
POTASSIUM: 4.1 meq/L (ref 3.5–5.1)
Sodium: 136 mEq/L (ref 136–145)
TOTAL PROTEIN: 6 g/dL — AB (ref 6.4–8.3)
Total Bilirubin: 0.2 mg/dL (ref 0.20–1.20)

## 2014-08-11 MED ORDER — DEXTROSE 5 % IV SOLN
Freq: Once | INTRAVENOUS | Status: AC
  Administered 2014-08-11: 10:00:00 via INTRAVENOUS

## 2014-08-11 MED ORDER — PALONOSETRON HCL INJECTION 0.25 MG/5ML
0.2500 mg | Freq: Once | INTRAVENOUS | Status: AC
  Administered 2014-08-11: 0.25 mg via INTRAVENOUS

## 2014-08-11 MED ORDER — OXALIPLATIN CHEMO INJECTION 100 MG/20ML
85.0000 mg/m2 | Freq: Once | INTRAVENOUS | Status: AC
  Administered 2014-08-11: 175 mg via INTRAVENOUS
  Filled 2014-08-11: qty 35

## 2014-08-11 MED ORDER — SODIUM CHLORIDE 0.9 % IV SOLN
Freq: Once | INTRAVENOUS | Status: AC
  Administered 2014-08-11: 10:00:00 via INTRAVENOUS
  Filled 2014-08-11: qty 5

## 2014-08-11 MED ORDER — HYDROCODONE-ACETAMINOPHEN 5-325 MG PO TABS: 1.0000 | ORAL_TABLET | ORAL | Status: DC | PRN

## 2014-08-11 MED ORDER — SODIUM CHLORIDE 0.9 % IJ SOLN
10.0000 mL | INTRAMUSCULAR | Status: DC | PRN
  Filled 2014-08-11: qty 10

## 2014-08-11 MED ORDER — PALONOSETRON HCL INJECTION 0.25 MG/5ML
INTRAVENOUS | Status: AC
  Filled 2014-08-11: qty 5

## 2014-08-11 MED ORDER — HEPARIN SOD (PORK) LOCK FLUSH 100 UNIT/ML IV SOLN
500.0000 [IU] | Freq: Once | INTRAVENOUS | Status: DC | PRN
  Filled 2014-08-11: qty 5

## 2014-08-11 MED ORDER — FLUOROURACIL CHEMO INJECTION 2.5 GM/50ML
400.0000 mg/m2 | Freq: Once | INTRAVENOUS | Status: AC
  Administered 2014-08-11: 850 mg via INTRAVENOUS
  Filled 2014-08-11: qty 17

## 2014-08-11 MED ORDER — ONDANSETRON 4 MG PO TBDP: 4.0000 mg | ORAL_TABLET | ORAL | Status: DC | PRN

## 2014-08-11 MED ORDER — SODIUM CHLORIDE 0.9 % IV SOLN
2400.0000 mg/m2 | INTRAVENOUS | Status: DC
  Administered 2014-08-11: 4950 mg via INTRAVENOUS
  Filled 2014-08-11: qty 99

## 2014-08-11 MED ORDER — LEUCOVORIN CALCIUM INJECTION 350 MG
406.0000 mg/m2 | Freq: Once | INTRAVENOUS | Status: AC
  Administered 2014-08-11: 840 mg via INTRAVENOUS
  Filled 2014-08-11: qty 42

## 2014-08-11 NOTE — Progress Notes (Signed)
Patient's Port-A-Cath was accessed in the Infusion Room.

## 2014-08-11 NOTE — Telephone Encounter (Signed)
Pt went to chemo advised would get schedule for him and then call him with D/T, also my chart pt with msg to reply to confirm D/T.... KJ, sent msg to add chemo

## 2014-08-11 NOTE — Patient Instructions (Addendum)
[place] [facility] Discharge Instructions for Patients Receiving Chemotherapy  Today you received the following chemotherapy agents FOLFOX.  To help prevent nausea and vomiting after your treatment, we encourage you to take your nausea medication as directed.    If you develop nausea and vomiting that is not controlled by your nausea medication, call the clinic.   BELOW ARE SYMPTOMS THAT SHOULD BE REPORTED IMMEDIATELY:  *FEVER GREATER THAN 100.5 F  *CHILLS WITH OR WITHOUT FEVER  NAUSEA AND VOMITING THAT IS NOT CONTROLLED WITH YOUR NAUSEA MEDICATION  *UNUSUAL SHORTNESS OF BREATH  *UNUSUAL BRUISING OR BLEEDING  TENDERNESS IN MOUTH AND THROAT WITH OR WITHOUT PRESENCE OF ULCERS  *URINARY PROBLEMS  *BOWEL PROBLEMS  UNUSUAL RASH Items with * indicate a potential emergency and should be followed up as soon as possible.  Feel free to call the clinic you have any questions or concerns. The clinic phone number is [phone].  Please show the [facility] at check-in to the Emergency Department and triage nurse.  Leucovorin injection What is this medicine? LEUCOVORIN (loo koe VOR in) is used to prevent or treat the harmful effects of some medicines. This medicine is used to treat anemia caused by a low amount of folic acid in the body. It is also used with 5-fluorouracil (5-FU) to treat colon cancer. This medicine may be used for other purposes; ask your health care provider or pharmacist if you have questions. What should I tell my health care provider before I take this medicine? They need to know if you have any of these conditions: -anemia from low levels of vitamin B-12 in the blood -an unusual or allergic reaction to leucovorin, folic acid, other medicines, foods, dyes, or preservatives -pregnant or trying to get pregnant -breast-feeding How should I use this medicine? This medicine is for injection into a muscle or into a vein. It is given by a health care  professional in a hospital or clinic setting. Talk to your pediatrician regarding the use of this medicine in children. Special care may be needed. Overdosage: If you think you have taken too much of this medicine contact a poison control center or emergency room at once. NOTE: This medicine is only for you. Do not share this medicine with others. What if I miss a dose? This does not apply. What may interact with this medicine? -capecitabine -fluorouracil -phenobarbital -phenytoin -primidone -trimethoprim-sulfamethoxazole This list may not describe all possible interactions. Give your health care provider a list of all the medicines, herbs, non-prescription drugs, or dietary supplements you use. Also tell them if you smoke, drink alcohol, or use illegal drugs. Some items may interact with your medicine. What should I watch for while using this medicine? Your condition will be monitored carefully while you are receiving this medicine. This medicine may increase the side effects of 5-fluorouracil, 5-FU. Tell your doctor or health care professional if you have diarrhea or mouth sores that do not get better or that get worse. What side effects may I notice from receiving this medicine? Side effects that you should report to your doctor or health care professional as soon as possible: -allergic reactions like skin rash, itching or hives, swelling of the face, lips, or tongue -breathing problems -fever, infection -mouth sores -unusual bleeding or bruising -unusually weak or tired Side effects that usually do not require medical attention (report to your doctor or health care professional if they continue or are bothersome): -constipation or diarrhea -loss of appetite -nausea, vomiting This  list may not describe all possible side effects. Call your doctor for medical advice about side effects. You may report side effects to FDA at 1-800-FDA-1088. Where should I keep my medicine? This drug is  given in a hospital or clinic and will not be stored at home. NOTE: This sheet is a summary. It may not cover all possible information. If you have questions about this medicine, talk to your doctor, pharmacist, or health care provider.  2015, Elsevier/Gold Standard. (2007-10-15 16:50:29) Oxaliplatin Injection What is this medicine? OXALIPLATIN (ox AL i PLA tin) is a chemotherapy drug. It targets fast dividing cells, like cancer cells, and causes these cells to die. This medicine is used to treat cancers of the colon and rectum, and many other cancers. This medicine may be used for other purposes; ask your health care provider or pharmacist if you have questions. COMMON BRAND NAME(S): Eloxatin What should I tell my health care provider before I take this medicine? They need to know if you have any of these conditions: -kidney disease -an unusual or allergic reaction to oxaliplatin, other chemotherapy, other medicines, foods, dyes, or preservatives -pregnant or trying to get pregnant -breast-feeding How should I use this medicine? This drug is given as an infusion into a vein. It is administered in a hospital or clinic by a specially trained health care professional. Talk to your pediatrician regarding the use of this medicine in children. Special care may be needed. Overdosage: If you think you have taken too much of this medicine contact a poison control center or emergency room at once. NOTE: This medicine is only for you. Do not share this medicine with others. What if I miss a dose? It is important not to miss a dose. Call your doctor or health care professional if you are unable to keep an appointment. What may interact with this medicine? -medicines to increase blood counts like filgrastim, pegfilgrastim, sargramostim -probenecid -some antibiotics like amikacin, gentamicin, neomycin, polymyxin B, streptomycin, tobramycin -zalcitabine Talk to your doctor or health care professional  before taking any of these medicines: -acetaminophen -aspirin -ibuprofen -ketoprofen -naproxen This list may not describe all possible interactions. Give your health care provider a list of all the medicines, herbs, non-prescription drugs, or dietary supplements you use. Also tell them if you smoke, drink alcohol, or use illegal drugs. Some items may interact with your medicine. What should I watch for while using this medicine? Your condition will be monitored carefully while you are receiving this medicine. You will need important blood work done while you are taking this medicine. This medicine can make you more sensitive to cold. Do not drink cold drinks or use ice. Cover exposed skin before coming in contact with cold temperatures or cold objects. When out in cold weather wear warm clothing and cover your mouth and nose to warm the air that goes into your lungs. Tell your doctor if you get sensitive to the cold. This drug may make you feel generally unwell. This is not uncommon, as chemotherapy can affect healthy cells as well as cancer cells. Report any side effects. Continue your course of treatment even though you feel ill unless your doctor tells you to stop. In some cases, you may be given additional medicines to help with side effects. Follow all directions for their use. Call your doctor or health care professional for advice if you get a fever, chills or sore throat, or other symptoms of a cold or flu. Do not treat yourself. This drug  decreases your body's ability to fight infections. Try to avoid being around people who are sick. This medicine may increase your risk to bruise or bleed. Call your doctor or health care professional if you notice any unusual bleeding. Be careful brushing and flossing your teeth or using a toothpick because you may get an infection or bleed more easily. If you have any dental work done, tell your dentist you are receiving this medicine. Avoid taking products  that contain aspirin, acetaminophen, ibuprofen, naproxen, or ketoprofen unless instructed by your doctor. These medicines may hide a fever. Do not become pregnant while taking this medicine. Women should inform their doctor if they wish to become pregnant or think they might be pregnant. There is a potential for serious side effects to an unborn child. Talk to your health care professional or pharmacist for more information. Do not breast-feed an infant while taking this medicine. Call your doctor or health care professional if you get diarrhea. Do not treat yourself. What side effects may I notice from receiving this medicine? Side effects that you should report to your doctor or health care professional as soon as possible: -allergic reactions like skin rash, itching or hives, swelling of the face, lips, or tongue -low blood counts - This drug may decrease the number of white blood cells, red blood cells and platelets. You may be at increased risk for infections and bleeding. -signs of infection - fever or chills, cough, sore throat, pain or difficulty passing urine -signs of decreased platelets or bleeding - bruising, pinpoint red spots on the skin, black, tarry stools, nosebleeds -signs of decreased red blood cells - unusually weak or tired, fainting spells, lightheadedness -breathing problems -chest pain, pressure -cough -diarrhea -jaw tightness -mouth sores -nausea and vomiting -pain, swelling, redness or irritation at the injection site -pain, tingling, numbness in the hands or feet -problems with balance, talking, walking -redness, blistering, peeling or loosening of the skin, including inside the mouth -trouble passing urine or change in the amount of urine Side effects that usually do not require medical attention (report to your doctor or health care professional if they continue or are bothersome): -changes in vision -constipation -hair loss -loss of appetite -metallic taste in  the mouth or changes in taste -stomach pain This list may not describe all possible side effects. Call your doctor for medical advice about side effects. You may report side effects to FDA at 1-800-FDA-1088. Where should I keep my medicine? This drug is given in a hospital or clinic and will not be stored at home. NOTE: This sheet is a summary. It may not cover all possible information. If you have questions about this medicine, talk to your doctor, pharmacist, or health care provider.  2015, [employer]vier/Gold Standard. (2007-11-05 17:22:47) Fluorouracil, 5-FU injection What is this medicine? FLUOROURACIL, 5-FU (flure oh YOOR a sil) is a chemotherapy drug. It slows the growth of cancer cells. This medicine is used to treat many types of cancer like breast cancer, colon or rectal cancer, pancreatic cancer, and stomach cancer. This medicine may be used for other purposes; ask your health care provider or pharmacist if you have questions. COMMON BRAND NAME(S): Adrucil What should I tell my health care provider before I take this medicine? They need to know if you have any of these conditions: -blood disorders -dihydropyrimidine dehydrogenase (DPD) deficiency -infection (especially a virus infection such as chickenpox, cold sores, or herpes) -kidney disease -liver disease -malnourished, poor nutrition -recent or ongoing radiation therapy -an unusual  or allergic reaction to fluorouracil, other chemotherapy, other medicines, foods, dyes, or preservatives -pregnant or trying to get pregnant -breast-feeding How should I use this medicine? This drug is given as an infusion or injection into a vein. It is administered in a hospital or clinic by a specially trained health care professional. Talk to your pediatrician regarding the use of this medicine in children. Special care may be needed. Overdosage: If you think you have taken too much of this medicine contact a poison control center or emergency  room at once. NOTE: This medicine is only for you. Do not share this medicine with others. What if I miss a dose? It is important not to miss your dose. Call your doctor or health care professional if you are unable to keep an appointment. What may interact with this medicine? -allopurinol -cimetidine -dapsone -digoxin -hydroxyurea -leucovorin -levamisole -medicines for seizures like ethotoin, fosphenytoin, phenytoin -medicines to increase blood counts like filgrastim, pegfilgrastim, sargramostim -medicines that treat or prevent blood clots like warfarin, enoxaparin, and dalteparin -methotrexate -metronidazole -pyrimethamine -some other chemotherapy drugs like busulfan, cisplatin, estramustine, vinblastine -trimethoprim -trimetrexate -vaccines Talk to your doctor or health care professional before taking any of these medicines: -acetaminophen -aspirin -ibuprofen -ketoprofen -naproxen This list may not describe all possible interactions. Give your health care provider a list of all the medicines, herbs, non-prescription drugs, or dietary supplements you use. Also tell them if you smoke, drink alcohol, or use illegal drugs. Some items may interact with your medicine. What should I watch for while using this medicine? Visit your doctor for checks on your progress. This drug may make you feel generally unwell. This is not uncommon, as chemotherapy can affect healthy cells as well as cancer cells. Report any side effects. Continue your course of treatment even though you feel ill unless your doctor tells you to stop. In some cases, you may be given additional medicines to help with side effects. Follow all directions for their use. Call your doctor or health care professional for advice if you get a fever, chills or sore throat, or other symptoms of a cold or flu. Do not treat yourself. This drug decreases your body's ability to fight infections. Try to avoid being around people who are  sick. This medicine may increase your risk to bruise or bleed. Call your doctor or health care professional if you notice any unusual bleeding. Be careful brushing and flossing your teeth or using a toothpick because you may get an infection or bleed more easily. If you have any dental work done, tell your dentist you are receiving this medicine. Avoid taking products that contain aspirin, acetaminophen, ibuprofen, naproxen, or ketoprofen unless instructed by your doctor. These medicines may hide a fever. Do not become pregnant while taking this medicine. Women should inform their doctor if they wish to become pregnant or think they might be pregnant. There is a potential for serious side effects to an unborn child. Talk to your health care professional or pharmacist for more information. Do not breast-feed an infant while taking this medicine. Men should inform their doctor if they wish to father a child. This medicine may lower sperm counts. Do not treat diarrhea with over the counter products. Contact your doctor if you have diarrhea that lasts more than 2 days or if it is severe and watery. This medicine can make you more sensitive to the sun. Keep out of the sun. If you cannot avoid being in the sun, wear protective clothing and use  sunscreen. Do not use sun lamps or tanning beds/booths. What side effects may I notice from receiving this medicine? Side effects that you should report to your doctor or health care professional as soon as possible: -allergic reactions like skin rash, itching or hives, swelling of the face, lips, or tongue -low blood counts - this medicine may decrease the number of white blood cells, red blood cells and platelets. You may be at increased risk for infections and bleeding. -signs of infection - fever or chills, cough, sore throat, pain or difficulty passing urine -signs of decreased platelets or bleeding - bruising, pinpoint red spots on the skin, black, tarry stools,  blood in the urine -signs of decreased red blood cells - unusually weak or tired, fainting spells, lightheadedness -breathing problems -changes in vision -chest pain -mouth sores -nausea and vomiting -pain, swelling, redness at site where injected -pain, tingling, numbness in the hands or feet -redness, swelling, or sores on hands or feet -stomach pain -unusual bleeding Side effects that usually do not require medical attention (report to your doctor or health care professional if they continue or are bothersome): -changes in finger or toe nails -diarrhea -dry or itchy skin -hair loss -headache -loss of appetite -sensitivity of eyes to the light -stomach upset -unusually teary eyes This list may not describe all possible side effects. Call your doctor for medical advice about side effects. You may report side effects to FDA at 1-800-FDA-1088. Where should I keep my medicine? This drug is given in a hospital or clinic and will not be stored at home. NOTE: This sheet is a summary. It may not cover all possible information. If you have questions about this medicine, talk to your doctor, pharmacist, or health care provider.  2015, Elsevier/Gold Standard. (2007-08-14 13:53:16)

## 2014-08-11 NOTE — Progress Notes (Signed)
  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 He completed a second cycle of FOLFOX beginning 07/28/2014. No neuropathy symptoms. No mouth sores. He reports irregular bowel habits. He has a good appetite. Certain foods cause abdominal pain. He takes hydrocodone once or twice per week. He is exercising. In general he feels better prior to beginning chemotherapy.  Objective:  Vital signs in last 24 hours:  Blood pressure 107/60, pulse 65, temperature 98.4 F (36.9 C), temperature source Oral, resp. rate 18, height 6\' 1"  (1.854 m), weight 181 lb 12.8 oz (82.464 kg), SpO2 100 %.    HEENT: No thrush or ulcers Resp: Decreased breath sounds with inspiratory rhonchi at the bases, no respiratory distress Cardio: Regular rate and rhythm with an occasional premature beat GI: No hepatosplenomegaly, no mass, nontender, mildly distended, no apparent ascites Vascular: No leg edema  Skin: Palms without erythema   Portacath/PICC-without erythema  Lab Results:  Lab Results  Component Value Date   WBC 4.6 07/28/2014   HGB 8.7* 07/28/2014   HCT 27.1* 07/28/2014   MCV 88.9 07/28/2014   PLT 185 07/28/2014   NEUTROABS 2.7 07/28/2014    Lab Results  Component Value Date   NA 138 07/28/2014    Lab Results  Component Value Date   CEA 3.0 07/01/2014    Imaging:  No results found.  Medications: I have reviewed the patient's current medications.  Assessment/Plan: 1. 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 Abdominal pain secondary to #1, improved 3. Iron deficiency, Hemoccult positive stool 4. Partial small bowel obstruction-clinically improved 5. Bronchiectasis 6. 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 He is maintaining his weight and appears to be tolerating the chemotherapy well. The plan is to proceed with cycle 3 FOLFOX today. He will return for an office visit and cycle 4 in 2 weeks. He will undergo a restaging CT evaluation after cycle 5.  Betsy Coder, MD  08/11/2014  8:45 AM

## 2014-08-12 ENCOUNTER — Other Ambulatory Visit: Payer: Self-pay | Admitting: *Deleted

## 2014-08-12 DIAGNOSIS — D649 Anemia, unspecified: Secondary | ICD-10-CM

## 2014-08-13 ENCOUNTER — Ambulatory Visit (HOSPITAL_BASED_OUTPATIENT_CLINIC_OR_DEPARTMENT_OTHER): Payer: 59

## 2014-08-13 VITALS — BP 129/67 | HR 71 | Temp 98.6°F

## 2014-08-13 DIAGNOSIS — C786 Secondary malignant neoplasm of retroperitoneum and peritoneum: Secondary | ICD-10-CM

## 2014-08-13 DIAGNOSIS — C801 Malignant (primary) neoplasm, unspecified: Secondary | ICD-10-CM

## 2014-08-13 MED ORDER — HEPARIN SOD (PORK) LOCK FLUSH 100 UNIT/ML IV SOLN
500.0000 [IU] | Freq: Once | INTRAVENOUS | Status: AC | PRN
  Administered 2014-08-13: 500 [IU]
  Filled 2014-08-13: qty 5

## 2014-08-13 MED ORDER — SODIUM CHLORIDE 0.9 % IJ SOLN
10.0000 mL | INTRAMUSCULAR | Status: DC | PRN
  Administered 2014-08-13: 10 mL
  Filled 2014-08-13: qty 10

## 2014-08-18 ENCOUNTER — Inpatient Hospital Stay (HOSPITAL_COMMUNITY)
Admission: EM | Admit: 2014-08-18 | Discharge: 2014-08-24 | DRG: 374 | Disposition: A | Discharge status: Home / Self Care | Payer: 59 | Attending: Family Medicine | Admitting: Family Medicine

## 2014-08-18 ENCOUNTER — Emergency Department (HOSPITAL_COMMUNITY): Payer: 59

## 2014-08-18 ENCOUNTER — Encounter (HOSPITAL_COMMUNITY): Payer: Self-pay

## 2014-08-18 DIAGNOSIS — R1084 Generalized abdominal pain: Secondary | ICD-10-CM | POA: Diagnosis not present

## 2014-08-18 DIAGNOSIS — C787 Secondary malignant neoplasm of liver and intrahepatic bile duct: Secondary | ICD-10-CM | POA: Diagnosis present

## 2014-08-18 DIAGNOSIS — Z7982 Long term (current) use of aspirin: Secondary | ICD-10-CM | POA: Diagnosis not present

## 2014-08-18 DIAGNOSIS — D649 Anemia, unspecified: Secondary | ICD-10-CM | POA: Diagnosis present

## 2014-08-18 DIAGNOSIS — Z79891 Long term (current) use of opiate analgesic: Secondary | ICD-10-CM | POA: Diagnosis not present

## 2014-08-18 DIAGNOSIS — E43 Unspecified severe protein-calorie malnutrition: Secondary | ICD-10-CM | POA: Diagnosis present

## 2014-08-18 DIAGNOSIS — Z8701 Personal history of pneumonia (recurrent): Secondary | ICD-10-CM | POA: Diagnosis not present

## 2014-08-18 DIAGNOSIS — R109 Unspecified abdominal pain: Secondary | ICD-10-CM | POA: Diagnosis not present

## 2014-08-18 DIAGNOSIS — D61810 Antineoplastic chemotherapy induced pancytopenia: Secondary | ICD-10-CM | POA: Diagnosis present

## 2014-08-18 DIAGNOSIS — H3530 Unspecified macular degeneration: Secondary | ICD-10-CM | POA: Diagnosis present

## 2014-08-18 DIAGNOSIS — E785 Hyperlipidemia, unspecified: Secondary | ICD-10-CM | POA: Diagnosis present

## 2014-08-18 DIAGNOSIS — C800 Disseminated malignant neoplasm, unspecified: Secondary | ICD-10-CM | POA: Diagnosis present

## 2014-08-18 DIAGNOSIS — Z820 Family history of epilepsy and other diseases of the nervous system: Secondary | ICD-10-CM

## 2014-08-18 DIAGNOSIS — C179 Malignant neoplasm of small intestine, unspecified: Secondary | ICD-10-CM | POA: Diagnosis present

## 2014-08-18 DIAGNOSIS — Z8249 Family history of ischemic heart disease and other diseases of the circulatory system: Secondary | ICD-10-CM | POA: Diagnosis not present

## 2014-08-18 DIAGNOSIS — K5660 Unspecified intestinal obstruction: Secondary | ICD-10-CM | POA: Diagnosis present

## 2014-08-18 DIAGNOSIS — T451X5A Adverse effect of antineoplastic and immunosuppressive drugs, initial encounter: Secondary | ICD-10-CM | POA: Diagnosis present

## 2014-08-18 DIAGNOSIS — C786 Secondary malignant neoplasm of retroperitoneum and peritoneum: Principal | ICD-10-CM | POA: Diagnosis present

## 2014-08-18 DIAGNOSIS — I2510 Atherosclerotic heart disease of native coronary artery without angina pectoris: Secondary | ICD-10-CM | POA: Diagnosis present

## 2014-08-18 DIAGNOSIS — Z6822 Body mass index (BMI) 22.0-22.9, adult: Secondary | ICD-10-CM

## 2014-08-18 DIAGNOSIS — J479 Bronchiectasis, uncomplicated: Secondary | ICD-10-CM | POA: Diagnosis present

## 2014-08-18 DIAGNOSIS — I10 Essential (primary) hypertension: Secondary | ICD-10-CM | POA: Diagnosis present

## 2014-08-18 DIAGNOSIS — Z833 Family history of diabetes mellitus: Secondary | ICD-10-CM | POA: Diagnosis not present

## 2014-08-18 DIAGNOSIS — Z791 Long term (current) use of non-steroidal anti-inflammatories (NSAID): Secondary | ICD-10-CM | POA: Diagnosis not present

## 2014-08-18 DIAGNOSIS — Z79899 Other long term (current) drug therapy: Secondary | ICD-10-CM | POA: Diagnosis not present

## 2014-08-18 DIAGNOSIS — G250 Essential tremor: Secondary | ICD-10-CM | POA: Diagnosis present

## 2014-08-18 DIAGNOSIS — Z825 Family history of asthma and other chronic lower respiratory diseases: Secondary | ICD-10-CM

## 2014-08-18 DIAGNOSIS — K56609 Unspecified intestinal obstruction, unspecified as to partial versus complete obstruction: Secondary | ICD-10-CM

## 2014-08-18 HISTORY — DX: Essential (primary) hypertension: I10

## 2014-08-18 LAB — URINALYSIS, ROUTINE W REFLEX MICROSCOPIC
Bilirubin Urine: NEGATIVE
Glucose, UA: NEGATIVE mg/dL
HGB URINE DIPSTICK: NEGATIVE
Ketones, ur: NEGATIVE mg/dL
Leukocytes, UA: NEGATIVE
Nitrite: NEGATIVE
PROTEIN: NEGATIVE mg/dL
Specific Gravity, Urine: 1.02 (ref 1.005–1.030)
Urobilinogen, UA: 0.2 mg/dL (ref 0.0–1.0)
pH: 7 (ref 5.0–8.0)

## 2014-08-18 LAB — CBC WITH DIFFERENTIAL/PLATELET
BASOS ABS: 0 10*3/uL (ref 0.0–0.1)
BASOS PCT: 1 % (ref 0–1)
Eosinophils Absolute: 0 10*3/uL (ref 0.0–0.7)
Eosinophils Relative: 1 % (ref 0–5)
HEMATOCRIT: 27.2 % — AB (ref 39.0–52.0)
HEMOGLOBIN: 8.7 g/dL — AB (ref 13.0–17.0)
LYMPHS ABS: 1 10*3/uL (ref 0.7–4.0)
Lymphocytes Relative: 39 % (ref 12–46)
MCH: 27.6 pg (ref 26.0–34.0)
MCHC: 32 g/dL (ref 30.0–36.0)
MCV: 86.3 fL (ref 78.0–100.0)
Monocytes Absolute: 0.2 10*3/uL (ref 0.1–1.0)
Monocytes Relative: 8 % (ref 3–12)
Neutro Abs: 1.3 10*3/uL — ABNORMAL LOW (ref 1.7–7.7)
Neutrophils Relative %: 51 % (ref 43–77)
PLATELETS: 239 10*3/uL (ref 150–400)
RBC: 3.15 MIL/uL — ABNORMAL LOW (ref 4.22–5.81)
RDW: 15.4 % (ref 11.5–15.5)
WBC: 2.5 10*3/uL — AB (ref 4.0–10.5)

## 2014-08-18 LAB — COMPREHENSIVE METABOLIC PANEL
ALBUMIN: 3.1 g/dL — AB (ref 3.5–5.2)
ALT: 14 U/L (ref 0–53)
AST: 24 U/L (ref 0–37)
Alkaline Phosphatase: 67 U/L (ref 39–117)
Anion gap: 9 (ref 5–15)
BILIRUBIN TOTAL: 0.2 mg/dL — AB (ref 0.3–1.2)
BUN: 12 mg/dL (ref 6–23)
CO2: 25 mmol/L (ref 19–32)
Calcium: 8.2 mg/dL — ABNORMAL LOW (ref 8.4–10.5)
Chloride: 99 mmol/L (ref 96–112)
Creatinine, Ser: 0.65 mg/dL (ref 0.50–1.35)
Glucose, Bld: 116 mg/dL — ABNORMAL HIGH (ref 70–99)
POTASSIUM: 4.1 mmol/L (ref 3.5–5.1)
Sodium: 133 mmol/L — ABNORMAL LOW (ref 135–145)
Total Protein: 5.8 g/dL — ABNORMAL LOW (ref 6.0–8.3)

## 2014-08-18 MED ORDER — PRIMIDONE 250 MG PO TABS
250.0000 mg | ORAL_TABLET | Freq: Every morning | ORAL | Status: DC
  Administered 2014-08-19 – 2014-08-24 (×6): 250 mg via ORAL
  Filled 2014-08-18 (×6): qty 1

## 2014-08-18 MED ORDER — DOCUSATE SODIUM 100 MG PO CAPS
100.0000 mg | ORAL_CAPSULE | Freq: Two times a day (BID) | ORAL | Status: DC
  Administered 2014-08-19 – 2014-08-24 (×7): 100 mg via ORAL
  Filled 2014-08-18 (×13): qty 1

## 2014-08-18 MED ORDER — STERILE WATER FOR INJECTION IJ SOLN
INTRAMUSCULAR | Status: AC
  Administered 2014-08-18: 19:00:00
  Filled 2014-08-18: qty 10

## 2014-08-18 MED ORDER — VITAMIN B-1 100 MG PO TABS
100.0000 mg | ORAL_TABLET | Freq: Every day | ORAL | Status: DC
  Administered 2014-08-19 – 2014-08-21 (×3): 100 mg via ORAL
  Filled 2014-08-18 (×4): qty 1

## 2014-08-18 MED ORDER — ADULT MULTIVITAMIN W/MINERALS CH
1.0000 | ORAL_TABLET | Freq: Every day | ORAL | Status: DC
  Administered 2014-08-19 – 2014-08-21 (×3): 1 via ORAL
  Filled 2014-08-18 (×4): qty 1

## 2014-08-18 MED ORDER — ALBUTEROL SULFATE (2.5 MG/3ML) 0.083% IN NEBU: 2.5000 mg | INHALATION_SOLUTION | RESPIRATORY_TRACT | Status: DC | PRN

## 2014-08-18 MED ORDER — PROPRANOLOL HCL ER 80 MG PO CP24
80.0000 mg | ORAL_CAPSULE | Freq: Every day | ORAL | Status: DC
  Administered 2014-08-19 – 2014-08-24 (×6): 80 mg via ORAL
  Filled 2014-08-18 (×6): qty 1

## 2014-08-18 MED ORDER — ATORVASTATIN CALCIUM 10 MG PO TABS
10.0000 mg | ORAL_TABLET | Freq: Every morning | ORAL | Status: DC
  Administered 2014-08-19 – 2014-08-20 (×2): 10 mg via ORAL
  Filled 2014-08-18 (×2): qty 1

## 2014-08-18 MED ORDER — ONDANSETRON HCL 4 MG PO TABS
4.0000 mg | ORAL_TABLET | Freq: Four times a day (QID) | ORAL | Status: DC | PRN
  Administered 2014-08-18: 4 mg via ORAL
  Filled 2014-08-18: qty 1

## 2014-08-18 MED ORDER — ZOLPIDEM TARTRATE 5 MG PO TABS: 5.0000 mg | ORAL_TABLET | Freq: Every evening | ORAL | Status: DC | PRN

## 2014-08-18 MED ORDER — ACETAMINOPHEN 650 MG RE SUPP: 650.0000 mg | Freq: Four times a day (QID) | RECTAL | Status: DC | PRN

## 2014-08-18 MED ORDER — ONDANSETRON HCL 4 MG/2ML IJ SOLN
4.0000 mg | Freq: Four times a day (QID) | INTRAMUSCULAR | Status: DC | PRN
  Administered 2014-08-19: 4 mg via INTRAVENOUS
  Filled 2014-08-18: qty 2

## 2014-08-18 MED ORDER — LOSARTAN POTASSIUM 25 MG PO TABS
25.0000 mg | ORAL_TABLET | Freq: Every day | ORAL | Status: DC
  Administered 2014-08-19 – 2014-08-24 (×6): 25 mg via ORAL
  Filled 2014-08-18 (×6): qty 1

## 2014-08-18 MED ORDER — SODIUM CHLORIDE 0.9 % IV SOLN
INTRAVENOUS | Status: DC
  Administered 2014-08-19 (×2): via INTRAVENOUS

## 2014-08-18 MED ORDER — FOLIC ACID 1 MG PO TABS
1.0000 mg | ORAL_TABLET | Freq: Every day | ORAL | Status: DC
Start: 2014-08-18 — End: 2014-08-21
  Administered 2014-08-19 – 2014-08-21 (×3): 1 mg via ORAL
  Filled 2014-08-18 (×4): qty 1

## 2014-08-18 MED ORDER — ASPIRIN EC 81 MG PO TBEC
81.0000 mg | DELAYED_RELEASE_TABLET | Freq: Every day | ORAL | Status: DC
  Administered 2014-08-19 – 2014-08-24 (×6): 81 mg via ORAL
  Filled 2014-08-18 (×7): qty 1

## 2014-08-18 MED ORDER — HYDROMORPHONE HCL 1 MG/ML IJ SOLN
0.5000 mg | INTRAMUSCULAR | Status: DC | PRN
Start: 2014-08-18 — End: 2014-08-24
  Administered 2014-08-19: 0.5 mg via INTRAVENOUS
  Filled 2014-08-18: qty 1

## 2014-08-18 MED ORDER — ACETAMINOPHEN 325 MG PO TABS: 650.0000 mg | ORAL_TABLET | Freq: Four times a day (QID) | ORAL | Status: DC | PRN

## 2014-08-18 MED ORDER — ONDANSETRON HCL 4 MG/2ML IJ SOLN
4.0000 mg | Freq: Once | INTRAMUSCULAR | Status: AC
  Administered 2014-08-18: 4 mg via INTRAVENOUS
  Filled 2014-08-18: qty 2

## 2014-08-18 MED ORDER — SODIUM CHLORIDE 0.9 % IV SOLN
Freq: Once | INTRAVENOUS | Status: AC
  Administered 2014-08-18: 18:00:00 via INTRAVENOUS

## 2014-08-18 MED ORDER — HYDROMORPHONE HCL 1 MG/ML IJ SOLN
1.0000 mg | Freq: Once | INTRAMUSCULAR | Status: AC
  Administered 2014-08-18: 1 mg via INTRAVENOUS
  Filled 2014-08-18: qty 1

## 2014-08-18 NOTE — ED Provider Notes (Signed)
CSN: [id number]     Arrival date & time 08/18/14  1720 History   First MD Initiated Contact with Patient 08/18/14 1738     Chief Complaint  Patient presents with  . CHEMO CARD     abd pain and nausea     (Consider location/radiation/quality/duration/timing/severity/associated sxs/prior Treatment) HPI Comments: Patient presents to the ER for evaluation of abdominal pain. Patient has previously diagnosed adenocarcinoma carcinomatosis. Patient does report previous bowel obstructions secondary to his disease. For the last 24 hours he has been having increasing abdominal pain and nausea. He has not had any vomiting. He reports no bowel movement or passage of flatulence for the last 2 days. Symptoms feel similar to when he had this bowel obstruction. No chest pain or difficulty breathing. No shortness of breath.   Past Medical History  Diagnosis Date  . Tremor     takes Primidone 250 once daily and inderall 160 daily for this-has had it since he was 21  . Pneumonia 06/2012  . Macular degeneration   . Benign essential tremor   . Coronary artery disease   . Anemia 06/30/2014  . 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 Tonsillectomy    . Video bronchoscopy Bilateral 04/29/2013    Procedure: VIDEO BRONCHOSCOPY WITHOUT FLUORO;  Surgeon: Collene Gobble, MD;  Location: [facility];  Service: Cardiopulmonary;  Laterality: Bilateral;   Family History  Problem Relation Age of Onset  . CAD Father 43    Died MI  . Atrial fibrillation Father   . Asthma Father   . Alzheimer's disease Mother   . Diabetes Mother   . Heart disease Mother   . Atrial fibrillation Mother   . Seizures Son     Died status epilepticus   History  Substance Use Topics  . Smoking status: Never Smoker   . Smokeless tobacco: Never Used  . Alcohol Use: No     Comment: wine with dinner    Review of Systems  Gastrointestinal: Positive for nausea and abdominal pain.   All other systems reviewed and are negative.     Allergies  Review of patient's allergies indicates no known allergies.  Home Medications   Prior to Admission medications   Medication Sig Start Date End Date Taking? Authorizing Provider  acetaminophen (TYLENOL) 500 MG tablet Take 1,000 mg by mouth every 6 (six) hours as needed for moderate pain or headache.   Yes Historical Provider, MD  aspirin 81 MG tablet Take 81 mg by mouth daily.   Yes Historical Provider, MD  atorvastatin (LIPITOR) 10 MG tablet Take 10 mg by mouth every morning.    Yes Historical Provider, MD  HYDROcodone-acetaminophen (NORCO/VICODIN) 5-325 MG per tablet Take 1-2 tablets by mouth every 4 (four) hours as needed for moderate pain. 08/11/14  Yes Ladell Pier, MD  ibuprofen (ADVIL,MOTRIN) 200 MG tablet Take 800 mg by mouth every 6 (six) hours as needed for headache or moderate pain.   Yes Historical Provider, MD  lidocaine-prilocaine (EMLA) cream Apply 1 application topically as needed. Apply to [facility] 1-2 hours prior to stick and cover with plastic wrap 07/08/14  Yes Ladell Pier, MD  losartan (COZAAR) 25 MG tablet Take 1 tablet (25 mg total) by mouth daily. 09/17/13  Yes Sherren Mocha, MD  Multiple Vitamins-Minerals (PRESERVISION AREDS PO) Take 2 capsules by mouth every morning.    Yes Historical Provider, MD  ondansetron (ZOFRAN ODT) 4 MG disintegrating tablet Take 1 tablet (  4 mg total) by mouth every 4 (four) hours as needed for nausea or vomiting. 08/11/14  Yes Ladell Pier, MD  oxyCODONE (OXY IR/ROXICODONE) 5 MG immediate release tablet Take 1-2 tablets (5-10 mg total) by mouth every 4 (four) hours as needed for severe pain. 07/28/14  Yes Ladell Pier, MD  primidone (MYSOLINE) 250 MG tablet Take 1 tablet (250 mg total) by mouth at bedtime. Patient taking differently: Take 250 mg by mouth every morning.  10/21/13  Yes Asencion Partridge Dohmeier, MD  prochlorperazine (COMPAZINE) 10 MG tablet Take 1 tablet (10 mg total) by  mouth every 6 (six) hours as needed for nausea. 07/08/14  Yes Ladell Pier, MD  propranolol ER (INDERAL LA) 80 MG 24 hr capsule Take 1 capsule (80 mg total) by mouth daily. 10/21/13  Yes Carmen Dohmeier, MD  ipratropium (ATROVENT) 0.06 % nasal spray Place 2 sprays into both nostrils every 6 (six) hours as needed for rhinitis. Patient not taking: Reported on 08/11/2014 11/03/13   Tammy S Parrett, NP   BP 109/66 mmHg  Pulse 57  Temp(Src) 98.3 F (36.8 C) (Oral)  Resp 15  SpO2 97% Physical Exam  Constitutional: He is oriented to person, place, and time. He appears well-developed and well-nourished. No distress.  HENT:  Head: Normocephalic and atraumatic.  Right Ear: Hearing normal.  Left Ear: Hearing normal.  Nose: Nose normal.  Mouth/Throat: Oropharynx is clear and moist and mucous membranes are normal.  Eyes: Conjunctivae and EOM are normal. Pupils are equal, round, and reactive to light.  Neck: Normal range of motion. Neck supple.  Cardiovascular: Regular rhythm, S1 normal and S2 normal.  Exam reveals no gallop and no friction rub.   No murmur heard. Pulmonary/Chest: Effort normal and breath sounds normal. No respiratory distress. He exhibits no tenderness.  Abdominal: Soft. Normal appearance and bowel sounds are normal. He exhibits distension. There is no hepatosplenomegaly. There is generalized tenderness. There is no rebound, no guarding, no tenderness at McBurney's point and negative Murphy's sign. No hernia.  Musculoskeletal: Normal range of motion.  Neurological: He is alert and oriented to person, place, and time. He has normal strength. No cranial nerve deficit or sensory deficit. Coordination normal. GCS eye subscore is 4. GCS verbal subscore is 5. GCS motor subscore is 6.  Skin: Skin is warm, dry and intact. No rash noted. No cyanosis.  Psychiatric: He has a normal mood and affect. His speech is normal and behavior is normal. Thought content normal.  Nursing note and vitals  reviewed.   ED Course  Procedures (including critical care time) Labs Review Labs Reviewed  CBC WITH DIFFERENTIAL/PLATELET - Abnormal; Notable for the following:    WBC 2.5 (*)    RBC 3.15 (*)    Hemoglobin 8.7 (*)    HCT 27.2 (*)    Neutro Abs 1.3 (*)    All other components within normal limits  COMPREHENSIVE METABOLIC PANEL - Abnormal; Notable for the following:    Sodium 133 (*)    Glucose, Bld 116 (*)    Calcium 8.2 (*)    Total Protein 5.8 (*)    Albumin 3.1 (*)    Total Bilirubin 0.2 (*)    All other components within normal limits  CULTURE, BLOOD (ROUTINE X 2)  CULTURE, BLOOD (ROUTINE X 2)  URINE CULTURE  URINALYSIS, ROUTINE W REFLEX MICROSCOPIC    Imaging Review Dg Abd Acute W/chest  08/18/2014   CLINICAL DATA:  Abdominal carcinomatosis with pain and nausea  EXAM: DG ABDOMEN ACUTE W/ 1V CHEST  COMPARISON:  Chest CT March 16, 2014; CT abdomen and pelvis June 30, 2014  FINDINGS: PA chest: Port-A-Cath tip is in the superior vena cava. There is no edema or consolidation. The heart size and pulmonary vascularity are normal. No adenopathy.  Supine and upright abdomen: There are multiple loops of dilated small bowel with multiple air-fluid levels. No free air. There are a few small vascular calcifications in the pelvis.  IMPRESSION: The bowel gas pattern is consistent with obstruction. No free air. No lung edema or consolidation.   Electronically Signed   By: Lowella Grip III M.D.   On: 08/18/2014 19:52     EKG Interpretation None      MDM   Final diagnoses:  Abdominal pain    She presents to the ER for evaluation of abdominal pain. He has distention and diffuse tenderness but there are bowel sounds present. Patient reports previous history of partial small bowel obstruction secondary to adenocarcinoma carcinomatosis. Abdominal x-ray shows multiple air-fluid levels consistent with obstruction. Patient's laboratory reveals pancytopenia, consistent with  chemotherapy. Patient will require hospitalization for further management.    Orpah Greek, MD 08/18/14 [phone]

## 2014-08-18 NOTE — ED Notes (Signed)
Pt reports abd pain beginning 24 hrs ago, pt has taken x2 5-325MG  Vicodin with no relief.

## 2014-08-18 NOTE — H&P (Signed)
[facility] History and Physical  Wesley Dillon KGU:[id number] DOB: November 07, 1945 DOA: 08/18/2014  Referring physician: Malachy Moan, MD PCP: Gerrit Heck, MD   Chief Complaint: Abdominal Pain  HPI: Wesley Dillon is a 69 y.o. male presents with abdominal pain. Patient has a history of Abdominal Carcinomatosis recently diagnosed on chemotherapy with FOLFOX., In addition he has a history of MAI with bronchiectasis. Patient has had partial SBO in the past. Apparently he had extensive disease on initial diagnosis. Patient states he has been having episodic pain in his abdomen. He states that the most recent pain started about 24 hours ago after a meal. He did take some pain medications but did not improve much. He comes into the ED with diffuse pain. He has had no bowel movement for the last 48 hours. He has noted nausea and has had no vomiting. The pain states that pain is cramping and a dull ache. He has noted no rectal bleeding. He states he has no headaches. Has no chest pain. He has no shortness of breath. In the ED he was given dilaudid and has had an improvement in his pain. His Xray shows SBO pattern with air fluid levels.   Review of Systems:  Constitutional:  +weight loss, night sweats, Fevers.  HEENT:  No headaches, No sneezing, itching, ear ache, nasal congestion, post nasal drip,  Cardio-vascular:  No chest pain, Orthopnea, PND, swelling in lower extremities  GI:  +heartburn, +indigestion, +abdominal pain, +nausea, no vomiting, no diarrhea, +loss of appetite  Resp:  No shortness of breath with exertion or at rest. No coughing up of blood.No change in color of mucus.No wheezing Skin:  Facial rash GU:  no dysuria, change in color of urine, no urgency or frequency.  Musculoskeletal:  No joint pain or swelling. No decreased range of motion  Psych:  No change in mood or affect. No depression or anxiety   Past Medical History  Diagnosis Date  . Tremor    takes Primidone 250 once daily and inderall 160 daily for this-has had it since he was 21  . Pneumonia 06/2012  . Macular degeneration   . Benign essential tremor   . Coronary artery disease   . Anemia 06/30/2014  . 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 Essential hypertension 08/18/2014   Past Surgical History  Procedure Laterality Date  . Tonsillectomy    . Video bronchoscopy Bilateral 04/29/2013    Procedure: VIDEO BRONCHOSCOPY WITHOUT FLUORO;  Surgeon: Collene Gobble, MD;  Location: [facility];  Service: Cardiopulmonary;  Laterality: Bilateral;   Social History:  reports that he has never smoked. He has never used smokeless tobacco. He reports that he does not drink alcohol or use illicit drugs.  No Known Allergies  Family History  Problem Relation Age of Onset  . CAD Father 80    Died MI  . Atrial fibrillation Father   . Asthma Father   . Alzheimer's disease Mother   . Diabetes Mother   . Heart disease Mother   . Atrial fibrillation Mother   . Seizures Son     Died status epilepticus    Prior to Admission medications   Medication Sig Start Date End Date Taking? Authorizing Provider  acetaminophen (TYLENOL) 500 MG tablet Take 1,000 mg by mouth every 6 (six) hours as needed for moderate pain or headache.   Yes Historical Provider, MD  aspirin 81 MG tablet Take 81 mg by mouth daily.   Yes Historical Provider, MD  atorvastatin (LIPITOR) 10  MG tablet Take 10 mg by mouth every morning.    Yes Historical Provider, MD  HYDROcodone-acetaminophen (NORCO/VICODIN) 5-325 MG per tablet Take 1-2 tablets by mouth every 4 (four) hours as needed for moderate pain. 08/11/14  Yes Ladell Pier, MD  ibuprofen (ADVIL,MOTRIN) 200 MG tablet Take 800 mg by mouth every 6 (six) hours as needed for headache or moderate pain.   Yes Historical Provider, MD  lidocaine-prilocaine (EMLA) cream Apply 1 application topically as needed. Apply to [facility] 1-2 hours prior to stick and cover with plastic wrap  07/08/14  Yes Ladell Pier, MD  losartan (COZAAR) 25 MG tablet Take 1 tablet (25 mg total) by mouth daily. 09/17/13  Yes Sherren Mocha, MD  Multiple Vitamins-Minerals (PRESERVISION AREDS PO) Take 2 capsules by mouth every morning.    Yes Historical Provider, MD  ondansetron (ZOFRAN ODT) 4 MG disintegrating tablet Take 1 tablet (4 mg total) by mouth every 4 (four) hours as needed for nausea or vomiting. 08/11/14  Yes Ladell Pier, MD  oxyCODONE (OXY IR/ROXICODONE) 5 MG immediate release tablet Take 1-2 tablets (5-10 mg total) by mouth every 4 (four) hours as needed for severe pain. 07/28/14  Yes Ladell Pier, MD  primidone (MYSOLINE) 250 MG tablet Take 1 tablet (250 mg total) by mouth at bedtime. Patient taking differently: Take 250 mg by mouth every morning.  10/21/13  Yes Asencion Partridge Dohmeier, MD  prochlorperazine (COMPAZINE) 10 MG tablet Take 1 tablet (10 mg total) by mouth every 6 (six) hours as needed for nausea. 07/08/14  Yes Ladell Pier, MD  propranolol ER (INDERAL LA) 80 MG 24 hr capsule Take 1 capsule (80 mg total) by mouth daily. 10/21/13  Yes Carmen Dohmeier, MD  ipratropium (ATROVENT) 0.06 % nasal spray Place 2 sprays into both nostrils every 6 (six) hours as needed for rhinitis. Patient not taking: Reported on 08/11/2014 11/03/13   Melvenia Needles, NP   Physical Exam: Filed Vitals:   08/18/14 1725 08/18/14 1833 08/18/14 2004  BP: 121/80 109/66 116/70  Pulse: 71 57 57  Temp: 98.3 F (36.8 C)    TempSrc: Oral    Resp: 16 15 16   SpO2: 96% 97% 96%    Wt Readings from Last 3 Encounters:  08/11/14 82.464 kg (181 lb 12.8 oz)  07/28/14 82.464 kg (181 lb 12.8 oz)  07/08/14 83.326 kg (183 lb 11.2 oz)    General:  Appears calm and comfortable Eyes: PERRL, normal lids, irises & conjunctiva ENT: grossly normal hearing, lips & tongue Neck: no LAD, masses or thyromegaly Cardiovascular: RRR, no m/r/g. No LE edema Respiratory: CTA bilaterally, no w/r/r. Normal respiratory  effort. Abdomen: soft, distended tenderness on palpation no rebound Skin: no rash or induration seen on limited exam Musculoskeletal: grossly normal tone BUE/BLE Psychiatric: grossly normal mood and affect, speech fluent and appropriate Neurologic: grossly non-focal.          Labs on Admission:  Basic Metabolic Panel:  Recent Labs Lab 08/18/14 1833  NA 133*  K 4.1  CL 99  CO2 25  GLUCOSE 116*  BUN 12  CREATININE 0.65  CALCIUM 8.2*   Liver Function Tests:  Recent Labs Lab 08/18/14 1833  AST 24  ALT 14  ALKPHOS 67  BILITOT 0.2*  PROT 5.8*  ALBUMIN 3.1*   No results for input(s): LIPASE, AMYLASE in the last 168 hours. No results for input(s): AMMONIA in the last 168 hours. CBC:  Recent Labs Lab 08/18/14 1833  WBC 2.5*  NEUTROABS 1.3*  HGB 8.7*  HCT 27.2*  MCV 86.3  PLT 239   Cardiac Enzymes: No results for input(s): CKTOTAL, CKMB, CKMBINDEX, TROPONINI in the last 168 hours.  BNP (last 3 results) No results for input(s): BNP in the last 8760 hours.  ProBNP (last 3 results) No results for input(s): PROBNP in the last 8760 hours.  CBG: No results for input(s): GLUCAP in the last 168 hours.  Radiological Exams on Admission: Dg Abd Acute W/chest  08/18/2014   CLINICAL DATA:  Abdominal carcinomatosis with pain and nausea  EXAM: DG ABDOMEN ACUTE W/ 1V CHEST  COMPARISON:  Chest CT March 16, 2014; CT abdomen and pelvis June 30, 2014  FINDINGS: PA chest: Port-A-Cath tip is in the superior vena cava. There is no edema or consolidation. The heart size and pulmonary vascularity are normal. No adenopathy.  Supine and upright abdomen: There are multiple loops of dilated small bowel with multiple air-fluid levels. No free air. There are a few small vascular calcifications in the pelvis.  IMPRESSION: The bowel gas pattern is consistent with obstruction. No free air. No lung edema or consolidation.   Electronically Signed   By: Lowella Grip III M.D.   On: 08/18/2014  19:52      Assessment/Plan Active Problems:   Adenocarcinoma carcinomatosis   Abdominal pain of multiple sites   Hyperlipidemia LDL goal <100   Essential hypertension   1. Abdominal Pain Multiple Sites -will admit for further observation -keep NPO for now with bowel rest -pain control as ordered -radiologically looks like SBO with no free air -if he clinically worsens will get surgical evaluation  2. Adenocarcinoma Carcinomatosis -patient is undergoing chemotherapy -oncology consultation in am  3. Hyperlipidemia -continue with statins as ordered  4. Essential hypertension -pressures under good control -will continue with home medications  5. Chronic Anemia -has had heme positive stools in the past -will check iron ferritin  6. Neutropenia -due to underlying malignancy and chemo -monitor labs   Code Status: Full Code (must indicate code status--if unknown or must be presumed, indicate so) DVT Prophylaxis:SCD Family Communication:Wife (indicate person spoken with, if applicable, with phone number if by telephone) Disposition Plan: Home (indicate anticipated LOS)  Time spent: 47min  Ladonte Verstraete A [facility] Pager [phone]

## 2014-08-18 NOTE — Plan of Care (Signed)
Problem: Consults Goal: General Medical Patient Education See Patient Education Module for specific education. Outcome: Progressing Tests and special procedures explained to patient with understanding.

## 2014-08-19 ENCOUNTER — Telehealth: Payer: Self-pay | Admitting: *Deleted

## 2014-08-19 LAB — CBC
HCT: 29.1 % — ABNORMAL LOW (ref 39.0–52.0)
HEMOGLOBIN: 9.2 g/dL — AB (ref 13.0–17.0)
MCH: 27.4 pg (ref 26.0–34.0)
MCHC: 31.6 g/dL (ref 30.0–36.0)
MCV: 86.6 fL (ref 78.0–100.0)
PLATELETS: 255 10*3/uL (ref 150–400)
RBC: 3.36 MIL/uL — ABNORMAL LOW (ref 4.22–5.81)
RDW: 15.5 % (ref 11.5–15.5)
WBC: 2.2 10*3/uL — AB (ref 4.0–10.5)

## 2014-08-19 LAB — COMPREHENSIVE METABOLIC PANEL
ALBUMIN: 3.1 g/dL — AB (ref 3.5–5.2)
ALT: 14 U/L (ref 0–53)
AST: 25 U/L (ref 0–37)
Alkaline Phosphatase: 73 U/L (ref 39–117)
Anion gap: 13 (ref 5–15)
BUN: 14 mg/dL (ref 6–23)
CHLORIDE: 97 mmol/L (ref 96–112)
CO2: 24 mmol/L (ref 19–32)
CREATININE: 0.59 mg/dL (ref 0.50–1.35)
Calcium: 8.3 mg/dL — ABNORMAL LOW (ref 8.4–10.5)
GFR calc Af Amer: >90 mL/min (ref >90)
Glucose, Bld: 118 mg/dL — ABNORMAL HIGH (ref 70–99)
POTASSIUM: 4.3 mmol/L (ref 3.5–5.1)
Sodium: 134 mmol/L — ABNORMAL LOW (ref 135–145)
TOTAL PROTEIN: 6.1 g/dL (ref 6.0–8.3)
Total Bilirubin: 0.7 mg/dL (ref 0.3–1.2)

## 2014-08-19 LAB — TSH: TSH: 2.03 u[IU]/mL (ref 0.350–4.500)

## 2014-08-19 LAB — GLUCOSE, CAPILLARY: Glucose-Capillary: 113 mg/dL — ABNORMAL HIGH (ref 70–99)

## 2014-08-19 MED ORDER — MENTHOL 3 MG MT LOZG
1.0000 | LOZENGE | OROMUCOSAL | Status: DC | PRN
  Administered 2014-08-19: 3 mg via ORAL
  Filled 2014-08-19 (×2): qty 9

## 2014-08-19 NOTE — Telephone Encounter (Signed)
TC from pt's wife this morning. She wants Dr. [name] to know that Wesley Dillon has been admitted to [facility] [facility] on 08/18/14 with bowel obstruction.

## 2014-08-19 NOTE — Progress Notes (Signed)
Pt vomited, too early to give antiemetic. Dr. Earlean Polka notified and orders received to insert NG tube. Will cont to monitor.

## 2014-08-19 NOTE — Progress Notes (Signed)
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 Small bowel 1ry on 3rd of 5th cycle FOLFOX under care from Dr. [name] admitted 08/18/14 with likely SBO 2/2 to Carcinomatous changes  Past medical history-As per Problem list Chart reviewed as below-   Consultants:  General surgery  Oncology   Antibiotics:  None    Subjective  Feels a little better this morning.   less bloated and distended.  No flatus, no stool as yet  had been having nausea vomiting and discomfort for the past 2-3 days Finally had severe pain last night and decided to come to the hospital   Objective    Interim History:   Telemetry: Non-telemetry   Objective: Filed Vitals:   08/18/14 2054 08/18/14 2100 08/18/14 2134 08/19/14 0627  BP: 108/61 98/61 127/72 130/74  Pulse: 56 56 56 57  Temp:   97.8 F (36.6 C) 97.5 F (36.4 C)  TempSrc:   Oral Oral  Resp: 14  16 16   Height:   6\' 1"  (1.854 m)   Weight:   78.472 kg (173 lb)   SpO2: 96% 97% 97% 100%    Intake/Output Summary (Last 24 hours) at 08/19/14 0942 Last data filed at 08/19/14 0600  Gross per 24 hour  Intake    400 ml  Output   1001 ml  Net   -601 ml    Exam:  General: EOMI, significantly pale Cardio: S1-S2 no murmur rub or gallop  Respiratory: Clinically clear no added sound Abdomen: Soft,Multiple bowel sounds heard   Neuro intact    Data Reviewed: Basic Metabolic Panel:  Recent Labs Lab 08/18/14 1833 08/19/14 0635  NA 133* 134*  K 4.1 4.3  CL 99 97  CO2 25 24  GLUCOSE 116* 118*  BUN 12 14  CREATININE 0.65 0.59  CALCIUM 8.2* 8.3*   Liver Function Tests:  Recent Labs Lab 08/18/14 1833 08/19/14 0635  AST 24 25  ALT 14 14  ALKPHOS 67  73  BILITOT 0.2* 0.7  PROT 5.8* 6.1  ALBUMIN 3.1* 3.1*   No results for input(s): LIPASE, AMYLASE in the last 168 hours. No results for input(s): AMMONIA in the last 168 hours. CBC:  Recent Labs Lab 08/18/14 1833 08/19/14 0635  WBC 2.5* 2.2*  NEUTROABS 1.3*  --   HGB 8.7* 9.2*  HCT 27.2* 29.1*  MCV 86.3 86.6  PLT 239 255   Cardiac Enzymes: No results for input(s): CKTOTAL, CKMB, CKMBINDEX, TROPONINI in the last 168 hours. BNP: Invalid input(s): POCBNP CBG:  Recent Labs Lab 08/19/14 0749  GLUCAP 113*    Recent Results (from the past 240 hour(s))  Culture, blood (routine x 2)     Status: None (Preliminary result)   Collection Time: 08/18/14  6:28 PM  Result Value Ref Range Status   Specimen Description BLOOD LEFT ANTECUBITAL  Final   Special Requests BOTTLES DRAWN AEROBIC AND ANAEROBIC 5CC EACH  Final   Culture   Final           BLOOD CULTURE RECEIVED NO GROWTH TO DATE CULTURE WILL BE HELD FOR 5 DAYS BEFORE ISSUING A FINAL NEGATIVE REPORT Performed at [facility]    Report Status PENDING  Incomplete  Culture, blood (routine x 2)  Status: None (Preliminary result)   Collection Time: 08/18/14  6:30 PM  Result Value Ref Range Status   Specimen Description BLOOD CHEST PORT  Final   Special Requests BOTTLES DRAWN AEROBIC AND ANAEROBIC 5CC EACH  Final   Culture   Final           BLOOD CULTURE RECEIVED NO GROWTH TO DATE CULTURE WILL BE HELD FOR 5 DAYS BEFORE ISSUING A FINAL NEGATIVE REPORT Performed at [facility]    Report Status PENDING  Incomplete     Studies:              All Imaging reviewed and is as per above notation   Scheduled Meds: . aspirin EC  81 mg Oral Daily  . atorvastatin  10 mg Oral q morning - 10a  . docusate sodium  100 mg Oral BID  . folic acid  1 mg Oral Daily  . losartan  25 mg Oral Daily  . multivitamin with minerals  1 tablet Oral Daily  . primidone  250 mg Oral q morning - 10a  . propranolol ER  80 mg Oral Daily    . thiamine  100 mg Oral Daily   Continuous Infusions: . sodium chloride 50 mL/hr at 08/19/14 0235     Assessment/Plan: 1. 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  We will keep an NG tube and as he is already put out more than a liter.  Hopefully we can empirically treat him with nonoperative management as patient does not wish to have aggressive surgical care at this stage. 2. ? Small bowel adenocarcinoma-will need to continue to monitor as an outpatient. CT scan for restaging as per oncologist who is aware of his admission. Patient to complete 2 more cycles of FOLFOX. 3. Benign essential tremor-continue propanolol 80 daily 4. HTN-continue losartan 25 daily 5. Hyperlipidemia discontinue Lipitor a.m.   Code Status:Full Family Communication:Long discussion with the family at the bedside Dispos pending resolution   Verneita Griffes, MD  Pager [phone] 08/19/2014, 9:42 AM    LOS: 1 day

## 2014-08-19 NOTE — Consult Note (Signed)
Reason for Consult:  SBO due to carcinomatosis Referring Physician:   Dr. Lindwood Coke is an 69 y.o. male.  HPI: We were asked to see [name] due to a small bowel obstruction secondary to carcinomatosis.  He was admitted to the hospital one month ago with a partial small bowel obstruction which is found to be secondary to a small bowel tumor. He is also noted to have carcinomatosis. He has been started on chemotherapy and had some symptomatic improvement.  About 2 days ago, he ate a meal began having distention and crampy abdominal pain the severity of which progressed. No bowel movement in 2 days. He presented to the hospital and x-rays were consistent with small bowel obstruction. He was admitted. He vomited some bilious material last night and then  An ng tube was placed. He currently is feeling better. He did pass a little gas this morning. His abdomen is less distended by his report.  Past Medical History  Diagnosis Date  . Tremor     takes Primidone 250 once daily and inderall 160 daily for this-has had it since he was 21  . Pneumonia 06/2012  . Macular degeneration   . Benign essential tremor   . Coronary artery disease   . Anemia 06/30/2014  . 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 Essential hypertension 08/18/2014    H/O MAC  Past Surgical History  Procedure Laterality Date  . Tonsillectomy    . Video bronchoscopy Bilateral 04/29/2013    Procedure: VIDEO BRONCHOSCOPY WITHOUT FLUORO;  Surgeon: Collene Gobble, MD;  Location: [facility];  Service: Cardiopulmonary;  Laterality: Bilateral;    Family History  Problem Relation Age of Onset  . CAD Father 50    Died MI  . Atrial fibrillation Father   . Asthma Father   . Alzheimer's disease Mother   . Diabetes Mother   . Heart disease Mother   . Atrial fibrillation Mother   . Seizures Son     Died status epilepticus    Social History:  reports that he has never smoked. He has never used smokeless tobacco. He  reports that he does not drink alcohol or use illicit drugs.  Allergies: No Known Allergies  Prior to Admission medications   Medication Sig Start Date End Date Taking? Authorizing Provider  acetaminophen (TYLENOL) 500 MG tablet Take 1,000 mg by mouth every 6 (six) hours as needed for moderate pain or headache.   Yes Historical Provider, MD  aspirin 81 MG tablet Take 81 mg by mouth daily.   Yes Historical Provider, MD  atorvastatin (LIPITOR) 10 MG tablet Take 10 mg by mouth every morning.    Yes Historical Provider, MD  HYDROcodone-acetaminophen (NORCO/VICODIN) 5-325 MG per tablet Take 1-2 tablets by mouth every 4 (four) hours as needed for moderate pain. 08/11/14  Yes Ladell Pier, MD  ibuprofen (ADVIL,MOTRIN) 200 MG tablet Take 800 mg by mouth every 6 (six) hours as needed for headache or moderate pain.   Yes Historical Provider, MD  lidocaine-prilocaine (EMLA) cream Apply 1 application topically as needed. Apply to [facility] 1-2 hours prior to stick and cover with plastic wrap 07/08/14  Yes Ladell Pier, MD  losartan (COZAAR) 25 MG tablet Take 1 tablet (25 mg total) by mouth daily. 09/17/13  Yes Sherren Mocha, MD  Multiple Vitamins-Minerals (PRESERVISION AREDS PO) Take 2 capsules by mouth every morning.    Yes Historical Provider, MD  ondansetron (ZOFRAN ODT) 4 MG disintegrating tablet Take 1 tablet (  4 mg total) by mouth every 4 (four) hours as needed for nausea or vomiting. 08/11/14  Yes Ladell Pier, MD  oxyCODONE (OXY IR/ROXICODONE) 5 MG immediate release tablet Take 1-2 tablets (5-10 mg total) by mouth every 4 (four) hours as needed for severe pain. 07/28/14  Yes Ladell Pier, MD  primidone (MYSOLINE) 250 MG tablet Take 1 tablet (250 mg total) by mouth at bedtime. Patient taking differently: Take 250 mg by mouth every morning.  10/21/13  Yes Asencion Partridge Dohmeier, MD  prochlorperazine (COMPAZINE) 10 MG tablet Take 1 tablet (10 mg total) by mouth every 6 (six) hours as needed for nausea. 07/08/14   Yes Ladell Pier, MD  propranolol ER (INDERAL LA) 80 MG 24 hr capsule Take 1 capsule (80 mg total) by mouth daily. 10/21/13  Yes Carmen Dohmeier, MD  ipratropium (ATROVENT) 0.06 % nasal spray Place 2 sprays into both nostrils every 6 (six) hours as needed for rhinitis. Patient not taking: Reported on 08/11/2014 11/03/13   Melvenia Needles, NP     Results for orders placed or performed during the hospital encounter of 08/18/14 (from the past 48 hour(s))  Urinalysis with microscopic     Status: None   Collection Time: 08/18/14  5:58 PM  Result Value Ref Range   Color, Urine YELLOW YELLOW   APPearance CLEAR CLEAR   Specific Gravity, Urine 1.020 1.005 - 1.030   pH 7.0 5.0 - 8.0   Glucose, UA NEGATIVE NEGATIVE mg/dL   Hgb urine dipstick NEGATIVE NEGATIVE   Bilirubin Urine NEGATIVE NEGATIVE   Ketones, ur NEGATIVE NEGATIVE mg/dL   Protein, ur NEGATIVE NEGATIVE mg/dL   Urobilinogen, UA 0.2 0.0 - 1.0 mg/dL   Nitrite NEGATIVE NEGATIVE   Leukocytes, UA NEGATIVE NEGATIVE    Comment: MICROSCOPIC NOT DONE ON URINES WITH NEGATIVE PROTEIN, BLOOD, LEUKOCYTES, NITRITE, OR GLUCOSE <1000 mg/dL.  Culture, blood (routine x 2)     Status: None (Preliminary result)   Collection Time: 08/18/14  6:28 PM  Result Value Ref Range   Specimen Description BLOOD LEFT ANTECUBITAL    Special Requests BOTTLES DRAWN AEROBIC AND ANAEROBIC 5CC EACH    Culture             BLOOD CULTURE RECEIVED NO GROWTH TO DATE CULTURE WILL BE HELD FOR 5 DAYS BEFORE ISSUING A FINAL NEGATIVE REPORT Performed at [facility]    Report Status PENDING   Culture, blood (routine x 2)     Status: None (Preliminary result)   Collection Time: 08/18/14  6:30 PM  Result Value Ref Range   Specimen Description BLOOD CHEST PORT    Special Requests BOTTLES DRAWN AEROBIC AND ANAEROBIC 5CC EACH    Culture             BLOOD CULTURE RECEIVED NO GROWTH TO DATE CULTURE WILL BE HELD FOR 5 DAYS BEFORE ISSUING A FINAL NEGATIVE REPORT Performed at  [facility]    Report Status PENDING   CBC WITH DIFFERENTIAL     Status: Abnormal   Collection Time: 08/18/14  6:33 PM  Result Value Ref Range   WBC 2.5 (L) 4.0 - 10.5 K/uL   RBC 3.15 (L) 4.22 - 5.81 MIL/uL   Hemoglobin 8.7 (L) 13.0 - 17.0 g/dL   HCT 27.2 (L) 39.0 - 52.0 %   MCV 86.3 78.0 - 100.0 fL   MCH 27.6 26.0 - 34.0 pg   MCHC 32.0 30.0 - 36.0 g/dL   RDW 15.4 11.5 - 15.5 %  Platelets 239 150 - 400 K/uL   Neutrophils Relative % 51 43 - 77 %   Neutro Abs 1.3 (L) 1.7 - 7.7 K/uL   Lymphocytes Relative 39 12 - 46 %   Lymphs Abs 1.0 0.7 - 4.0 K/uL   Monocytes Relative 8 3 - 12 %   Monocytes Absolute 0.2 0.1 - 1.0 K/uL   Eosinophils Relative 1 0 - 5 %   Eosinophils Absolute 0.0 0.0 - 0.7 K/uL   Basophils Relative 1 0 - 1 %   Basophils Absolute 0.0 0.0 - 0.1 K/uL  Comprehensive metabolic panel     Status: Abnormal   Collection Time: 08/18/14  6:33 PM  Result Value Ref Range   Sodium 133 (L) 135 - 145 mmol/L   Potassium 4.1 3.5 - 5.1 mmol/L   Chloride 99 96 - 112 mmol/L   CO2 25 19 - 32 mmol/L   Glucose, Bld 116 (H) 70 - 99 mg/dL   BUN 12 6 - 23 mg/dL   Creatinine, Ser 0.65 0.50 - 1.35 mg/dL   Calcium 8.2 (L) 8.4 - 10.5 mg/dL   Total Protein 5.8 (L) 6.0 - 8.3 g/dL   Albumin 3.1 (L) 3.5 - 5.2 g/dL   AST 24 0 - 37 U/L   ALT 14 0 - 53 U/L   Alkaline Phosphatase 67 39 - 117 U/L   Total Bilirubin 0.2 (L) 0.3 - 1.2 mg/dL   GFR calc non Af Amer >90 >90 mL/min   GFR calc Af Amer >90 >90 mL/min    Comment: (NOTE) The eGFR has been calculated using the CKD EPI equation. This calculation has not been validated in all clinical situations. eGFR's persistently <90 mL/min signify possible Chronic Kidney Disease.    Anion gap 9 5 - 15  TSH     Status: None   Collection Time: 08/19/14  6:35 AM  Result Value Ref Range   TSH 2.030 0.350 - 4.500 uIU/mL  Comprehensive metabolic panel     Status: Abnormal   Collection Time: 08/19/14  6:35 AM  Result Value Ref Range   Sodium  134 (L) 135 - 145 mmol/L   Potassium 4.3 3.5 - 5.1 mmol/L   Chloride 97 96 - 112 mmol/L   CO2 24 19 - 32 mmol/L   Glucose, Bld 118 (H) 70 - 99 mg/dL   BUN 14 6 - 23 mg/dL   Creatinine, Ser 0.59 0.50 - 1.35 mg/dL   Calcium 8.3 (L) 8.4 - 10.5 mg/dL   Total Protein 6.1 6.0 - 8.3 g/dL   Albumin 3.1 (L) 3.5 - 5.2 g/dL   AST 25 0 - 37 U/L   ALT 14 0 - 53 U/L   Alkaline Phosphatase 73 39 - 117 U/L   Total Bilirubin 0.7 0.3 - 1.2 mg/dL   GFR calc non Af Amer >90 >90 mL/min   GFR calc Af Amer >90 >90 mL/min    Comment: (NOTE) The eGFR has been calculated using the CKD EPI equation. This calculation has not been validated in all clinical situations. eGFR's persistently <90 mL/min signify possible Chronic Kidney Disease.    Anion gap 13 5 - 15  CBC     Status: Abnormal   Collection Time: 08/19/14  6:35 AM  Result Value Ref Range   WBC 2.2 (L) 4.0 - 10.5 K/uL   RBC 3.36 (L) 4.22 - 5.81 MIL/uL   Hemoglobin 9.2 (L) 13.0 - 17.0 g/dL   HCT 29.1 (L) 39.0 - 52.0 %  MCV 86.6 78.0 - 100.0 fL   MCH 27.4 26.0 - 34.0 pg   MCHC 31.6 30.0 - 36.0 g/dL   RDW 15.5 11.5 - 15.5 %   Platelets 255 150 - 400 K/uL  Glucose, capillary     Status: Abnormal   Collection Time: 08/19/14  7:49 AM  Result Value Ref Range   Glucose-Capillary 113 (H) 70 - 99 mg/dL   Comment 1 Notify RN     Dg Abd Acute W/chest  08/18/2014   CLINICAL DATA:  Abdominal carcinomatosis with pain and nausea  EXAM: DG ABDOMEN ACUTE W/ 1V CHEST  COMPARISON:  Chest CT March 16, 2014; CT abdomen and pelvis June 30, 2014  FINDINGS: PA chest: Port-A-Cath tip is in the superior vena cava. There is no edema or consolidation. The heart size and pulmonary vascularity are normal. No adenopathy.  Supine and upright abdomen: There are multiple loops of dilated small bowel with multiple air-fluid levels. No free air. There are a few small vascular calcifications in the pelvis.  IMPRESSION: The bowel gas pattern is consistent with obstruction. No  free air. No lung edema or consolidation.   Electronically Signed   By: Lowella Grip III M.D.   On: 08/18/2014 19:52    Review of Systems  Constitutional: Negative for fever and chills.  Gastrointestinal: Positive for heartburn, nausea, vomiting, abdominal pain and constipation.   Blood pressure 130/74, pulse 57, temperature 97.5 F (36.4 C), temperature source Oral, resp. rate 16, height 6' 1"  (1.854 m), weight 78.472 kg (173 lb), SpO2 100 %. Physical Exam  Constitutional: He appears well-developed and well-nourished. No distress.  HENT:  NG tube in nare draining green liquid  GI: Soft. He exhibits distension. He exhibits no mass. There is no tenderness.  Hypoactive bowel sounds  Neurological: He is alert.  Psychiatric: He has a normal mood and affect. His behavior is normal.    Assessment/Plan: Small bowel obstruction due to carcinomatosis. 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 Had been responding symptomatically to chemotherapy.  Plan: Nonoperative management for now with nasogastric tube decompression, bowel rest, and IV fluid hydration. I discussed this with his oncologist, Dr. [name]. If he does not improve within a few days, we'll get a CT scan with oral contrast to see if we can identify a specific point of obstruction. Last resort would be palliative surgery.  Jacelynn Hayton J 08/19/2014, 10:31 AM

## 2014-08-19 NOTE — Progress Notes (Signed)
#  16 NG tube inserted to low interm wall suction. Pt tolerated well. NG draining a yellow green drainage. Will cont to monitor.

## 2014-08-19 NOTE — Progress Notes (Signed)
INITIAL NUTRITION ASSESSMENT  DOCUMENTATION CODES Per approved criteria  -Severe malnutrition in the context of chronic illness  Pt meets criteria for severe MALNUTRITION in the context of chronic illness as evidenced by moderate-severe fat and muscle depletion and 4% weight loss x 1 week.  INTERVENTION: -Diet advancement per MD -If bowel rest is expected to last >7 days, consider nutrition support. -RD to continue to monitor plan of care  NUTRITION DIAGNOSIS: Inadequate oral intake related to bowel rest as evidenced by NPO status.   Goal: Pt to meet >/= 90% of their estimated nutrition needs   Monitor:  PO and supplemental intake, weight, labs, I/O's  Reason for Assessment: Pt identified as at nutrition risk on the Malnutrition Screen Tool  Admitting Dx: Abdominal Pain  ASSESSMENT: 69 y.o. male presents with abdominal pain. Patient has a history of Abdominal Carcinomatosis recently diagnosed on chemotherapy with FOLFOX.Patient states he has been having episodic pain in his abdomen. He states that the most recent pain started about 24 hours ago after a meal.  Pt reports eating soft foods PTA such as canned fruit, eggs, peanut butter. Pt states he tried Boost and Ensure but did not care for them. Pt reports UBW of 180 lb ( per weight history documentation, pt has lost 8 lb since 4/19, 4% weight loss x 1 week ,significant for time frame).  Pt currently NPO for bowel rest, NGT placed for decompression. Pt states his nausea has improved with the NGT.  Nutrition Focused Physical Exam:  Subcutaneous Fat:  Orbital Region: WNL Upper Arm Region: moderate-severe depletion Thoracic and Lumbar Region: NA  Muscle:  Temple Region: moderate-severe depletion Clavicle Bone Region: mild-moderate depletion Clavicle and Acromion Bone Region: mild-moderate depletion Scapular Bone Region: NA Dorsal Hand: mild depletion Patellar Region: NA Anterior Thigh Region: NA Posterior Calf Region:  NA  Edema: none  Labs reviewed: Low Na  Height: Ht Readings from Last 1 Encounters:  08/18/14 6\' 1"  (1.854 m)    Weight: Wt Readings from Last 1 Encounters:  08/18/14 173 lb (78.472 kg)    Ideal Body Weight: 184 lb  % Ideal Body Weight: 94%  Wt Readings from Last 10 Encounters:  08/18/14 173 lb (78.472 kg)  08/11/14 181 lb 12.8 oz (82.464 kg)  07/28/14 181 lb 12.8 oz (82.464 kg)  07/08/14 183 lb 11.2 oz (83.326 kg)  07/02/14 191 lb 2.2 oz (86.7 kg)  04/09/14 191 lb (86.637 kg)  12/16/13 183 lb 3.2 oz (83.099 kg)  11/03/13 183 lb 6.4 oz (83.19 kg)  10/21/13 180 lb (81.647 kg)  09/17/13 180 lb (81.647 kg)    Usual Body Weight: 180 lb -per pt  % Usual Body Weight: 96%  BMI:  Body mass index is 22.83 kg/(m^2).  Estimated Nutritional Needs: Kcal: 2100-2300 Protein: 115-125g Fluid: 2.1L/day  Skin: intact  Diet Order: Diet NPO time specified  EDUCATION NEEDS: -No education needs identified at this time   Intake/Output Summary (Last 24 hours) at 08/19/14 1012 Last data filed at 08/19/14 0600  Gross per 24 hour  Intake    400 ml  Output   1001 ml  Net   -601 ml    Last BM: PTA  Labs:   Recent Labs Lab 08/18/14 1833 08/19/14 0635  NA 133* 134*  K 4.1 4.3  CL 99 97  CO2 25 24  BUN 12 14  CREATININE 0.65 0.59  CALCIUM 8.2* 8.3*  GLUCOSE 116* 118*    CBG (last 3)   Recent Labs  08/19/14 0749  GLUCAP 113*    Scheduled Meds: . aspirin EC  81 mg Oral Daily  . atorvastatin  10 mg Oral q morning - 10a  . docusate sodium  100 mg Oral BID  . folic acid  1 mg Oral Daily  . losartan  25 mg Oral Daily  . multivitamin with minerals  1 tablet Oral Daily  . primidone  250 mg Oral q morning - 10a  . propranolol ER  80 mg Oral Daily  . thiamine  100 mg Oral Daily    Continuous Infusions: . sodium chloride 50 mL/hr at 08/19/14 0235    Past Medical History  Diagnosis Date  . Tremor     takes Primidone 250 once daily and inderall 160 daily  for this-has had it since he was 21  . Pneumonia 06/2012  . Macular degeneration   . Benign essential tremor   . Coronary artery disease   . Anemia 06/30/2014  . 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 Essential hypertension 08/18/2014    Past Surgical History  Procedure Laterality Date  . Tonsillectomy    . Video bronchoscopy Bilateral 04/29/2013    Procedure: VIDEO BRONCHOSCOPY WITHOUT FLUORO;  Surgeon: Collene Gobble, MD;  Location: [facility];  Service: Cardiopulmonary;  Laterality: Bilateral;    [name] Bibles, MS, RD, LDN Pager: [phone] After Hours Pager: [phone]

## 2014-08-20 ENCOUNTER — Inpatient Hospital Stay (HOSPITAL_COMMUNITY): Payer: 59

## 2014-08-20 DIAGNOSIS — E43 Unspecified severe protein-calorie malnutrition: Secondary | ICD-10-CM | POA: Diagnosis present

## 2014-08-20 LAB — HEMOGLOBIN A1C
Hgb A1c MFr Bld: 6 % — ABNORMAL HIGH (ref 4.8–5.6)
Mean Plasma Glucose: 126 mg/dL

## 2014-08-20 LAB — URINE CULTURE
Colony Count: NO GROWTH
Culture: NO GROWTH

## 2014-08-20 LAB — GLUCOSE, CAPILLARY: GLUCOSE-CAPILLARY: 105 mg/dL — AB (ref 70–99)

## 2014-08-20 MED ORDER — POTASSIUM CHLORIDE 2 MEQ/ML IV SOLN
INTRAVENOUS | Status: AC
  Administered 2014-08-20 – 2014-08-21 (×4): via INTRAVENOUS
  Filled 2014-08-20 (×7): qty 1000

## 2014-08-20 MED ORDER — SODIUM CHLORIDE 0.9 % IV SOLN: INTRAVENOUS | Status: DC

## 2014-08-20 NOTE — Progress Notes (Addendum)
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 He denies pain or cramps. NG output totals not recorded yet, but significant output. No stool or flatus since yesterday. I noted that his intake and output record is significantly negative, and his IV is only at 50 mL / hr. I have taken the liberty of increasing his IV fluids.   Objective: Vital signs in last 24 hours: Temp:  [97.5 F (36.4 C)-99 F (37.2 C)] 97.5 F (36.4 C) (04/28 0515) Pulse Rate:  [81-86] 86 (04/28 0515) Resp:  [18] 18 (04/28 0515) BP: (112-135)/(64-70) 135/69 mmHg (04/28 0515) SpO2:  [96 %-98 %] 97 % (04/28 0515) Last BM Date: 08/17/14  Intake/Output from previous day: 04/27 0701 - 04/28 0700 In: 1200 [I.V.:1200] Out: 2055 [Urine:525; Emesis/NG output:1530] Intake/Output this shift: Total I/O In: -  Out: 900 [Emesis/NG output:900]  General appearance: Alert. Cooperative. No distress. Mental status normal. Some muscle wasting noted. GI: NG  in and draining bilious fluid. Abdomen is soft and nontender. He is distended. No mass. No hernia in abdomen or inguinal area.  Lab Results:   Recent Labs  08/18/14 1833 08/19/14 0635  WBC 2.5* 2.2*  HGB 8.7* 9.2*  HCT 27.2* 29.1*  PLT 239 255   BMET  Recent Labs  08/18/14 1833 08/19/14 0635  NA 133* 134*  K 4.1 4.3  CL 99 97  CO2 25 24  GLUCOSE 116* 118*  BUN 12 14  CREATININE 0.65 0.59  CALCIUM 8.2* 8.3*   PT/INR No results for input(s): LABPROT, INR in the last 72 hours. ABG No results for input(s): PHART, HCO3 in the last 72 hours.  Invalid input(s): PCO2, PO2  Studies/Results: Dg Abd Acute W/chest  08/18/2014   CLINICAL DATA:  Abdominal carcinomatosis with pain and nausea  EXAM: DG ABDOMEN ACUTE W/ 1V CHEST  COMPARISON:  Chest CT March 16, 2014; CT abdomen and pelvis June 30, 2014  FINDINGS: PA chest: Port-A-Cath tip is in the superior vena cava. There is no edema or  consolidation. The heart size and pulmonary vascularity are normal. No adenopathy.  Supine and upright abdomen: There are multiple loops of dilated small bowel with multiple air-fluid levels. No free air. There are a few small vascular calcifications in the pelvis.  IMPRESSION: The bowel gas pattern is consistent with obstruction. No free air. No lung edema or consolidation.   Electronically Signed   By: Lowella Grip III M.D.   On: 08/18/2014 19:52    Anti-infectives: Anti-infectives    None      Assessment/Plan:  Malignant SBO.   Abdominal carcinomatosis, adenocarcinoma, primary unclear. Hard to know whether this is single or multiple areas. NG tube has been successful at palliating discomfort and distention, but SBO not resolved.    Wonder if terminal ileum, ileocecal valve is the primary.  CT scan and PET scan showed more mass effect in this area.    No evidence of bowel compromise.   We will continue to treat him conservatively for a few days. If he does not open up soon, however, we will need to do a CT scan with oral contrast to see if we can identify a specific transition point.    Palliative surgery for resection, bypass, or ostomy remains an option as a last resort.   I have taken the liberty of adjusting and increasing his IV fluids to account for the negative fluid balance. Check labs and x-ray tomorrow.  Abdominal carcinomatosis. Omental biopsy confirms adenocarcinoma. Primary uncertain. PET scan shows multiple areas of hypermetabolic bowel wall thickening, possible primary in the terminal ileum, solitary right liver metastasis, hypermetabolic nodular lung lesions. Status post 2 cycles of FOLFOX.  Goals of treatment are disease control and palliation.  Suspect significant protein calorie malnutrition. I did not discuss nutritional support with him or his desires regarding this. Certainly nutritional support should be implemented if we are to be aggressive in his  care.  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       LOS: 2 days    Fanny Skates M 08/20/2014

## 2014-08-20 NOTE — Care Management Note (Signed)
CARE MANAGEMENT NOTE 08/20/2014  Patient:  TAGE, FEGGINS   Account Number:  [id number]  Date Initiated:  08/20/2014  Documentation initiated by:  Marney Doctor  Subjective/Objective Assessment:   69 yo admitted with Abd Pain. Patient has a history of Abdominal Carcinomatosis recently diagnosed on chemotherapy with FOLFOX., In addition he has a history of MAI with bronchiectasis     Action/Plan:   From home with wife   Anticipated DC Date:  08/20/2014   Anticipated DC Plan:  [place]  CM consult      Choice offered to / List presented to:             Status of service:  In process, will continue to follow Medicare Important Message given?   (If response is "NO", the following Medicare IM given date fields will be blank) Date Medicare IM given:   Medicare IM given by:   Date Additional Medicare IM given:   Additional Medicare IM given by:    Discharge Disposition:    Per UR Regulation:  Reviewed for med. necessity/level of care/duration of stay  If discussed at [place] of Stay Meetings, dates discussed:    Comments:  08/20/14 Marney Doctor RN,BSN,NCM [phone] Chart reviewed and CM following for DC needs.   Lynnell Catalan, RN 08/20/2014, 3:35 PM

## 2014-08-20 NOTE — Progress Notes (Signed)
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 Small bowel 1ry on 3rd of 5th cycle FOLFOX under care from Dr. [name] admitted 08/18/14 with likely SBO 2/2 to Carcinomatous changes Dr. [name] of general surgery was consulted and patient had an NG tube placed to decompress   Past medical history-As per Problem list Chart reviewed as below-   Consultants:  General surgery  Oncology   Antibiotics:  None    Subjective   doing better. Pain in the abdomen is resolved however still has rib pain  denies any other issues   Objective    Interim History:   Telemetry: Non-telemetry   Objective: Filed Vitals:   08/19/14 0627 08/19/14 1412 08/19/14 2012 08/20/14 0515  BP: 130/74 122/70 112/64 135/69  Pulse: 57 82 81 86  Temp: 97.5 F (36.4 C) 99 F (37.2 C) 98.3 F (36.8 C) 97.5 F (36.4 C)  TempSrc: Oral Oral Oral Oral  Resp: 16 18 18 18   Height:      Weight:      SpO2: 100% 98% 96% 97%    Intake/Output Summary (Last 24 hours) at 08/20/14 1037 Last data filed at 08/20/14 1000  Gross per 24 hour  Intake   1300 ml  Output   2955 ml  Net  -1655 ml    Exam:  General: EOMI, significantly pale Cardio: S1-S2 no murmur rub or gallop  Respiratory: Clinically clear no added sound Abdomen: Soft,Multiple bowel sounds heard   Neuro intact    Data Reviewed: Basic Metabolic Panel:  Recent Labs Lab 08/18/14 1833 08/19/14 0635  NA 133* 134*  K 4.1 4.3  CL 99 97  CO2 25 24  GLUCOSE 116* 118*  BUN 12 14  CREATININE 0.65 0.59  CALCIUM 8.2* 8.3*   Liver Function Tests:  Recent Labs Lab 08/18/14 1833 08/19/14 0635  AST 24 25  ALT 14 14  ALKPHOS 67 73  BILITOT 0.2* 0.7  PROT 5.8*  6.1  ALBUMIN 3.1* 3.1*   No results for input(s): LIPASE, AMYLASE in the last 168 hours. No results for input(s): AMMONIA in the last 168 hours. CBC:  Recent Labs Lab 08/18/14 1833 08/19/14 0635  WBC 2.5* 2.2*  NEUTROABS 1.3*  --   HGB 8.7* 9.2*  HCT 27.2* 29.1*  MCV 86.3 86.6  PLT 239 255   Cardiac Enzymes: No results for input(s): CKTOTAL, CKMB, CKMBINDEX, TROPONINI in the last 168 hours. BNP: Invalid input(s): POCBNP CBG:  Recent Labs Lab 08/19/14 0749 08/20/14 0726  GLUCAP 113* 105*    Recent Results (from the past 240 hour(s))  Urine culture     Status: None   Collection Time: 08/18/14  5:58 PM  Result Value Ref Range Status   Specimen Description URINE, CLEAN CATCH  Final   Special Requests NONE  Final   Colony Count NO GROWTH Performed at [facility]   Final   Culture NO GROWTH Performed at [facility]   Final   Report Status 08/20/2014 FINAL  Final  Culture, blood (routine x 2)     Status: None (Preliminary result)   Collection Time: 08/18/14  6:28 PM  Result Value Ref Range Status   Specimen Description BLOOD LEFT ANTECUBITAL  Final   Special Requests BOTTLES DRAWN  AEROBIC AND ANAEROBIC 5CC EACH  Final   Culture   Final           BLOOD CULTURE RECEIVED NO GROWTH TO DATE CULTURE WILL BE HELD FOR 5 DAYS BEFORE ISSUING A FINAL NEGATIVE REPORT Performed at [facility]    Report Status PENDING  Incomplete  Culture, blood (routine x 2)     Status: None (Preliminary result)   Collection Time: 08/18/14  6:30 PM  Result Value Ref Range Status   Specimen Description BLOOD CHEST PORT  Final   Special Requests BOTTLES DRAWN AEROBIC AND ANAEROBIC 5CC EACH  Final   Culture   Final           BLOOD CULTURE RECEIVED NO GROWTH TO DATE CULTURE WILL BE HELD FOR 5 DAYS BEFORE ISSUING A FINAL NEGATIVE REPORT Performed at [facility]    Report Status PENDING  Incomplete     Studies:              All Imaging reviewed and is as  per above notation   Scheduled Meds: . aspirin EC  81 mg Oral Daily  . atorvastatin  10 mg Oral q morning - 10a  . docusate sodium  100 mg Oral BID  . folic acid  1 mg Oral Daily  . losartan  25 mg Oral Daily  . multivitamin with minerals  1 tablet Oral Daily  . primidone  250 mg Oral q morning - 10a  . propranolol ER  80 mg Oral Daily  . thiamine  100 mg Oral Daily   Continuous Infusions: . sodium chloride 0.9 % 1,000 mL with potassium chloride 20 mEq infusion 150 mL/hr at 08/20/14 4235     Assessment/Plan: 1. 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  We will keep an NG tube As patient still has high volume output  Hopefully 4 nonoperative management-appreciate oncology input.  repeat films a.m. to determine if any blockade 2. ? Small bowel adenocarcinoma-will need to continue to monitor as an outpatient. CT scan for restaging as per oncologist who is aware of his admission. Patient to complete 2 more cycles of FOLFOX. 3. Benign essential tremor-continue propanolol 80 daily 4. HTN-continue losartan 25 daily 5. Hyperlipidemia discontinue Lipitor a.m.   Code Status:Full Family Communication:Long discussion with the family at the bedside Dispos pending resolution   Verneita Griffes, MD  Pager [phone] 08/20/2014, 10:37 AM    LOS: 2 days

## 2014-08-21 ENCOUNTER — Inpatient Hospital Stay (HOSPITAL_COMMUNITY): Payer: 59

## 2014-08-21 LAB — GLUCOSE, CAPILLARY: Glucose-Capillary: 125 mg/dL — ABNORMAL HIGH (ref 70–99)

## 2014-08-21 LAB — BASIC METABOLIC PANEL
ANION GAP: 12 (ref 5–15)
BUN: 16 mg/dL (ref 6–23)
CO2: 25 mmol/L (ref 19–32)
Calcium: 8.3 mg/dL — ABNORMAL LOW (ref 8.4–10.5)
Chloride: 103 mmol/L (ref 96–112)
Creatinine, Ser: 0.81 mg/dL (ref 0.50–1.35)
GFR calc Af Amer: >90 mL/min (ref >90)
GFR, EST NON AFRICAN AMERICAN: 90 mL/min — AB (ref >90)
Glucose, Bld: 109 mg/dL — ABNORMAL HIGH (ref 70–99)
Potassium: 4.1 mmol/L (ref 3.5–5.1)
Sodium: 140 mmol/L (ref 135–145)

## 2014-08-21 LAB — CBC
HEMATOCRIT: 29.6 % — AB (ref 39.0–52.0)
Hemoglobin: 9.1 g/dL — ABNORMAL LOW (ref 13.0–17.0)
MCH: 26.8 pg (ref 26.0–34.0)
MCHC: 30.7 g/dL (ref 30.0–36.0)
MCV: 87.3 fL (ref 78.0–100.0)
Platelets: 281 10*3/uL (ref 150–400)
RBC: 3.39 MIL/uL — AB (ref 4.22–5.81)
RDW: 16.3 % — ABNORMAL HIGH (ref 11.5–15.5)
WBC: 6.9 10*3/uL (ref 4.0–10.5)

## 2014-08-21 LAB — MAGNESIUM: Magnesium: 2.2 mg/dL (ref 1.5–2.5)

## 2014-08-21 LAB — PHOSPHORUS: Phosphorus: 2.2 mg/dL — ABNORMAL LOW (ref 2.3–4.6)

## 2014-08-21 MED ORDER — FAT EMULSION 20 % IV EMUL
250.0000 mL | INTRAVENOUS | Status: DC
  Filled 2014-08-21: qty 250

## 2014-08-21 MED ORDER — SODIUM CHLORIDE 0.9 % IV SOLN
INTRAVENOUS | Status: DC
  Administered 2014-08-21: 07:00:00 via INTRAVENOUS
  Filled 2014-08-21 (×24): qty 1000

## 2014-08-21 MED ORDER — INSULIN ASPART 100 UNIT/ML ~~LOC~~ SOLN
0.0000 [IU] | Freq: Three times a day (TID) | SUBCUTANEOUS | Status: DC
  Administered 2014-08-22 – 2014-08-23 (×4): 1 [IU] via SUBCUTANEOUS

## 2014-08-21 MED ORDER — TRACE MINERALS CR-CU-F-FE-I-MN-MO-SE-ZN IV SOLN
INTRAVENOUS | Status: AC
  Administered 2014-08-21: 18:00:00 via INTRAVENOUS
  Filled 2014-08-21: qty 960

## 2014-08-21 MED ORDER — SODIUM PHOSPHATE 3 MMOLE/ML IV SOLN
15.0000 mmol | Freq: Once | INTRAVENOUS | Status: AC
  Administered 2014-08-21: 15 mmol via INTRAVENOUS
  Filled 2014-08-21: qty 5

## 2014-08-21 MED ORDER — FAT EMULSION 20 % IV EMUL
240.0000 mL | INTRAVENOUS | Status: AC
  Administered 2014-08-21: 240 mL via INTRAVENOUS
  Filled 2014-08-21: qty 250

## 2014-08-21 MED ORDER — PHENOL 1.4 % MT LIQD
1.0000 | OROMUCOSAL | Status: DC | PRN
  Filled 2014-08-21: qty 177

## 2014-08-21 MED ORDER — INSULIN ASPART 100 UNIT/ML ~~LOC~~ SOLN: 0.0000 [IU] | Freq: Three times a day (TID) | SUBCUTANEOUS | Status: DC

## 2014-08-21 MED ORDER — TRACE MINERALS CR-CU-F-FE-I-MN-MO-SE-ZN IV SOLN
INTRAVENOUS | Status: DC
  Filled 2014-08-21: qty 960

## 2014-08-21 MED ORDER — POTASSIUM CHLORIDE IN NACL 20-0.9 MEQ/L-% IV SOLN
INTRAVENOUS | Status: DC
  Administered 2014-08-21: 1000 mL via INTRAVENOUS
  Administered 2014-08-22: 23:00:00 via INTRAVENOUS
  Administered 2014-08-22: 1000 mL via INTRAVENOUS
  Filled 2014-08-21 (×5): qty 1000

## 2014-08-21 NOTE — Progress Notes (Signed)
PARENTERAL NUTRITION CONSULT NOTE - INITIAL  Pharmacy Consult for TPN Indication: SBO in setting of abdominal carcinomatosis  No Known Allergies  Patient Measurements: Height: 6\' 1"  (185.4 cm) Weight: 173 lb (78.472 kg) IBW/kg (Calculated) : 79.9   Vital Signs: Temp: 98.7 F (37.1 C) (04/29 0525) Temp Source: Oral (04/29 0525) BP: 123/73 mmHg (04/29 0525) Pulse Rate: 76 (04/29 0525) Intake/Output from previous day: 04/28 0701 - 04/29 0700 In: 3306.7 [P.O.:100; I.V.:3206.7] Out: 2650 [Urine:200; Emesis/NG output:2450] Intake/Output from this shift:    Labs:  Recent Labs  08/18/14 1833 08/19/14 0635 08/21/14 0500  WBC 2.5* 2.2* 6.9  HGB 8.7* 9.2* 9.1*  HCT 27.2* 29.1* 29.6*  PLT 239 255 281     Recent Labs  08/18/14 1833 08/19/14 0635 08/21/14 0500  NA 133* 134* 140  K 4.1 4.3 4.1  CL 99 97 103  CO2 25 24 25   GLUCOSE 116* 118* 109*  BUN 12 14 16   CREATININE 0.65 0.59 0.81  CALCIUM 8.2* 8.3* 8.3*  MG  --   --  2.2  PHOS  --   --  2.2*  PROT 5.8* 6.1  --   ALBUMIN 3.1* 3.1*  --   AST 24 25  --   ALT 14 14  --   ALKPHOS 67 73  --   BILITOT 0.2* 0.7  --   Corrected Calcium 9.0  Estimated Creatinine Clearance: 98.3 mL/min (by C-G formula based on Cr of 0.81).    Recent Labs  08/19/14 0749 08/20/14 0726  GLUCAP 113* 105*    Medical History: Past Medical History  Diagnosis Date  . Tremor     takes Primidone 250 once daily and inderall 160 daily for this-has had it since he was 21  . Pneumonia 06/2012  . Macular degeneration   . Benign essential tremor   . Coronary artery disease   . Anemia 06/30/2014  . 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 Essential hypertension 08/18/2014    Medications:  Scheduled:  . aspirin EC  81 mg Oral Daily  . docusate sodium  100 mg Oral BID  . folic acid  1 mg Oral Daily  . losartan  25 mg Oral Daily  . multivitamin with minerals  1 tablet Oral Daily  . primidone  250 mg Oral q morning - 10a  .  propranolol ER  80 mg Oral Daily  . thiamine  100 mg Oral Daily   Infusions:  . sodium chloride 0.9 % 1,000 mL infusion 500 mL/hr at 08/21/14 0635  . sodium chloride 0.9 % 1,000 mL with potassium chloride 20 mEq infusion 150 mL/hr at 08/21/14 0732   PRN: acetaminophen **OR** acetaminophen, albuterol, HYDROmorphone (DILAUDID) injection, menthol-cetylpyridinium, ondansetron **OR** ondansetron (ZOFRAN) IV, phenol, zolpidem  Insulin Requirements:  None at present.   Current Nutrition:  NPO  IVF:  NS + KCl 20 mEq/L at 150 mL/hr  Central access:  Implanted port (08/18/14)  TPN start date:  08/21/14  ASSESSMENT  HPI:  69 y/o M with abdominal carcinomatosis of undetermined primary, s/p 3 cycles of FOLFOX, admitted 08/18/14 with SBO.  This has persisted despite decompression with NGT and surgical intervention is being considered.  Orders received to begin TPN today with pharmacy assistance.  Significant events:  4/29: NG output remains high (~2.5L/day)  Today:   Glucose - slightly elevated at baseline  Electrolytes - WNL except phosphorus low  Renal - SCr and BUN WNL  LFTs - WNL at baseline  TGs - ordered for tomorrow  Prealbumin - ordered for tomorrow  NUTRITIONAL GOALS                                                                                             Per RD assessment 08/19/14: 2100-2300 kCal/day,  115-125 grams protein per day   Clinimix-E 5/15 at a goal rate of 100 ml/hr + 20% fat emulsion at 10 ml/hr to provide: 120 g/day protein, 2184 Kcal/day.  PLAN                                                                                                                         1. Na Phos 15 mM IV x 1 today 2. At 1800 today:  Start Clinimix-E 5/15 at 40 ml/hr.  Start 20% fat emulsion at 10 ml/hr.  TPN to contain standard multivitamins and trace elements, plus  folic acid 1 mg/day and thiamine 100 mg /day as previously ordered PO.  Reduce IVF to 100 ml/hr.  Check CBG q8h and cover with SSI Novolog "sensitive scale" if needed 3. Plan to advance TPN to goal rate over next several days as tolerated. 4. Full TPN lab panel tomorrow, then Mondays & Thursdays. 5. Follow clinical course.  Clayburn Pert, PharmD, BCPS Pager: [phone] 08/21/2014  10:01 AM

## 2014-08-21 NOTE — Progress Notes (Addendum)
Subjective: Has had 3 or 4 stools. A couple of these were tiny but one was of normal volume. Minimal flatus. NG output still significant, 2,450 mL in 24 hours. Notes occasional brief twinges of discomfort but mostly pain-free.  Long discussion with Ndrew about goals of care. He's willing to treat this conservatively for a short period of time in hopes that it will resolve and he will be able to tolerate liquid diet and go home without surgery. However, if the SBO does not resolve then he is in favor of proceeding with CT scan and possible surgical intervention, with the goal of getting the NG tube out and allowing liquid diet. He is aware and we discussed at length the possible outcomes of surgical intervention. Resection or bypass without ostomy is a possibility, proximal diverting ostomy is a possibility. Frozen abdomen with palliative gastrostomy tube and postop comfort care is one possible outcome as well.  We talked about nutritional support and its role in supporting the surgical patient. He is in favor of hyperalimentation since surgical intervention is a distinct possibility. I will start TNA today.  Objective: Vital signs in last 24 hours: Temp:  [97.9 F (36.6 C)-98.9 F (37.2 C)] 98.7 F (37.1 C) (04/29 0525) Pulse Rate:  [73-76] 76 (04/29 0525) Resp:  [16] 16 (04/29 0525) BP: (119-130)/(63-73) 123/73 mmHg (04/29 0525) SpO2:  [97 %-99 %] 97 % (04/29 0525) Last BM Date: 09-08-2014  Intake/Output from previous day: 09-08-22 0701 - 04/29 0700 In: 3306.7 [P.O.:100; I.V.:3206.7] Out: 2650 [Urine:200; Emesis/NG output:2450] Intake/Output this shift: Total I/O In: 1825 [I.V.:1825] Out: 1000 [Urine:150; Emesis/NG output:850]    EXAM: General appearance: Thin and alert. Stable. In no distress. Mental status normal. Good insight into his care plan GI: Distended. Hyperactive bowel sounds. Nontender. No guarding. No mass. No hernia. NG drainage bilious.     Lab Results:    Recent Labs  08/19/14 0635 08/21/14 0500  WBC 2.2* 6.9  HGB 9.2* 9.1*  HCT 29.1* 29.6*  PLT 255 281   BMET  Recent Labs  08/19/14 0635 08/21/14 0500  NA 134* 140  K 4.3 4.1  CL 97 103  CO2 24 25  GLUCOSE 118* 109*  BUN 14 16  CREATININE 0.59 0.81  CALCIUM 8.3* 8.3*   PT/INR No results for input(s): LABPROT, INR in the last 72 hours. ABG No results for input(s): PHART, HCO3 in the last 72 hours.  Invalid input(s): PCO2, PO2  Studies/Results: Dg Abd 2 Views  09/08/14   CLINICAL DATA:  Small bowel obstruction. Abdominal pain and distention. 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  EXAM: ABDOMEN - 2 VIEW  COMPARISON:  08/18/2014  FINDINGS: NG tube is now in place in the fundus of the stomach. Multiple stairstep air-fluid levels remain in the small bowel consistent with small bowel obstruction. There is a small amount of air in the nondistended colon.  Small bilateral pleural effusions.  IMPRESSION: Persistent small bowel obstruction, essentially unchanged.   Electronically Signed   By: Lorriane Shire M.D.   On: Sep 08, 2014 11:51    Anti-infectives: Anti-infectives    None      Assessment/Plan:    Malignant SBO. HD#4.   Abdominal carcinomatosis, adenocarcinoma, primary unclear. Hard to know whether this is single or multiple areas. NG tube has been successful at palliating discomfort and distention, but SBO not resolved.  Wonder if terminal ileum, ileocecal valve is the primary. Prior CT scan and PET scan showed more mass effect in this area.  No evidence of bowel compromise. Clinically this is a high-grade but partial obstruction.  We will continue to treat him conservatively for a day or two.  . If he does not open up soon, however, we will need to do a CT scan with oral contrast this weekend to see if we can identify a specific transition point.  Palliative surgery for resection, bypass, or ostomy/gastrostomy remains an option as a  last resort.  See my discussion above regarding goals of care   Abdominal carcinomatosis. Omental biopsy confirms adenocarcinoma. Primary uncertain. PET scan shows multiple areas of hypermetabolic bowel wall thickening, possible primary in the terminal ileum, solitary right liver metastasis, hypermetabolic nodular lung lesions. Status post 2 cycles of FOLFOX. Goals of treatment are disease control and palliation.  Suspect significant protein calorie malnutrition. Will start TNA today. See discussion above.  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    LOS: 3 days    Louna Rothgeb M 08/21/2014

## 2014-08-21 NOTE — Progress Notes (Signed)
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 Small bowel 1ry on 3rd of 5th cycle FOLFOX under care from Dr. [name] admitted 08/18/14 with likely SBO 2/2 to Carcinomatous changes Dr. [name] of general surgery was consulted and patient had an NG tube placed to decompress   Past medical history-As per Problem list Chart reviewed as below-   Consultants:  General surgery  Oncology   Antibiotics:  None    Subjective   doing better. Large stool this am No n/v Discomfort in abdomen [has a hiatal hernia] Still has relatively large output from NG tube   Objective    Interim History:   Telemetry: Non-telemetry   Objective: Filed Vitals:   08/20/14 0515 08/20/14 1310 08/20/14 2121 08/21/14 0525  BP: 135/69 130/65 119/63 123/73  Pulse: 86 73 73 76  Temp: 97.5 F (36.4 C) 97.9 F (36.6 C) 98.9 F (37.2 C) 98.7 F (37.1 C)  TempSrc: Oral Oral Oral Oral  Resp: 18 16 16 16   Height:      Weight:      SpO2: 97% 99% 97% 97%    Intake/Output Summary (Last 24 hours) at 08/21/14 1025 Last data filed at 08/21/14 0600  Gross per 24 hour  Intake   3010 ml  Output   1750 ml  Net   1260 ml    Exam:  General: EOMI, significantly pale Cardio: S1-S2 no murmur rub or gallop  Respiratory: Clinically clear no added sound Abdomen: Softer and less distended compared to prior,Multiple bowel sounds heard   Neuro intact    Data Reviewed: Basic Metabolic Panel:  Recent Labs Lab 08/18/14 1833 08/19/14 0635 08/21/14 0500  NA 133* 134* 140  K 4.1 4.3 4.1  CL 99 97 103  CO2 25 24 25   GLUCOSE 116* 118* 109*  BUN 12 14 16   CREATININE 0.65 0.59 0.81  CALCIUM 8.2* 8.3* 8.3*  MG  --   --  2.2    PHOS  --   --  2.2*   Liver Function Tests:  Recent Labs Lab 08/18/14 1833 08/19/14 0635  AST 24 25  ALT 14 14  ALKPHOS 67 73  BILITOT 0.2* 0.7  PROT 5.8* 6.1  ALBUMIN 3.1* 3.1*   No results for input(s): LIPASE, AMYLASE in the last 168 hours. No results for input(s): AMMONIA in the last 168 hours. CBC:  Recent Labs Lab 08/18/14 1833 08/19/14 0635 08/21/14 0500  WBC 2.5* 2.2* 6.9  NEUTROABS 1.3*  --   --   HGB 8.7* 9.2* 9.1*  HCT 27.2* 29.1* 29.6*  MCV 86.3 86.6 87.3  PLT 239 255 281   Cardiac Enzymes: No results for input(s): CKTOTAL, CKMB, CKMBINDEX, TROPONINI in the last 168 hours. BNP: Invalid input(s): POCBNP CBG:  Recent Labs Lab 08/19/14 0749 08/20/14 0726  GLUCAP 113* 105*    Recent Results (from the past 240 hour(s))  Urine culture     Status: None   Collection Time: 08/18/14  5:58 PM  Result Value Ref Range Status   Specimen Description URINE, CLEAN CATCH  Final   Special Requests NONE  Final   Colony Count NO GROWTH Performed at [facility]   Final   Culture NO GROWTH Performed at [facility]   Final  Report Status 08/20/2014 FINAL  Final  Culture, blood (routine x 2)     Status: None (Preliminary result)   Collection Time: 08/18/14  6:28 PM  Result Value Ref Range Status   Specimen Description BLOOD LEFT ANTECUBITAL  Final   Special Requests BOTTLES DRAWN AEROBIC AND ANAEROBIC 5CC EACH  Final   Culture   Final           BLOOD CULTURE RECEIVED NO GROWTH TO DATE CULTURE WILL BE HELD FOR 5 DAYS BEFORE ISSUING A FINAL NEGATIVE REPORT Performed at [facility]    Report Status PENDING  Incomplete  Culture, blood (routine x 2)     Status: None (Preliminary result)   Collection Time: 08/18/14  6:30 PM  Result Value Ref Range Status   Specimen Description BLOOD CHEST PORT  Final   Special Requests BOTTLES DRAWN AEROBIC AND ANAEROBIC 5CC EACH  Final   Culture   Final           BLOOD CULTURE RECEIVED NO GROWTH TO  DATE CULTURE WILL BE HELD FOR 5 DAYS BEFORE ISSUING A FINAL NEGATIVE REPORT Performed at [facility]    Report Status PENDING  Incomplete     Studies:              All Imaging reviewed and is as per above notation   Scheduled Meds: . aspirin EC  81 mg Oral Daily  . docusate sodium  100 mg Oral BID  . [START ON 08/22/2014] insulin aspart  0-9 Units Subcutaneous Q8H  . losartan  25 mg Oral Daily  . primidone  250 mg Oral q morning - 10a  . propranolol ER  80 mg Oral Daily  . sodium phosphate  Dextrose 5% IVPB  15 mmol Intravenous Once   Continuous Infusions: . 0.9 % NaCl with KCl 20 mEq / L    . fat emulsion    . sodium chloride 0.9 % 1,000 mL infusion 500 mL/hr at 08/21/14 0635  . sodium chloride 0.9 % 1,000 mL with potassium chloride 20 mEq infusion 150 mL/hr at 08/21/14 0732  . Marland KitchenTPN (CLINIMIX-E) Adult       Assessment/Plan: 1. 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  We will keep an NG tube As patient still has high volume output.  AXR 4/29 am confirms SBO persisiting  Hopefully 4 nonoperative management-appreciate oncology input.  Gen surgery started TPN 4/29.  Goals of care revisited with Dr. [name] and patient 4/29 2. ? Small bowel adenocarcinoma-will need to continue to monitor as an outpatient. CT scan for restaging as per oncologist who is aware of his admission. Patient to complete 2 more cycles of FOLFOX. 3. Benign essential tremor-continue propanolol 80 daily 4. HTN-continue losartan 25 daily 5. Hyperlipidemia discontinue Lipitor a.m.   Code Status:Full Family Communication: discussed c wife bedside Dispos pending resolution   15 min  Verneita Griffes, MD  Pager [phone] 08/21/2014, 10:25 AM    LOS: 3 days

## 2014-08-21 NOTE — Progress Notes (Signed)
I discussed the current situation and prognosis with [name] and his wife. He will continue evaluation and management of the bowel obstruction per the surgical service. Plans for the next cycle of FOLFOX chemotherapy will be placed on hold pending his recovery from the bowel obstruction.   Please call oncology as needed. I will check on him 08/24/2014.  Julieanne Manson, M.D.

## 2014-08-22 ENCOUNTER — Inpatient Hospital Stay (HOSPITAL_COMMUNITY): Payer: 59

## 2014-08-22 LAB — COMPREHENSIVE METABOLIC PANEL
ALT: 13 U/L (ref 0–53)
AST: 23 U/L (ref 0–37)
Albumin: 2.9 g/dL — ABNORMAL LOW (ref 3.5–5.2)
Alkaline Phosphatase: 60 U/L (ref 39–117)
Anion gap: 10 (ref 5–15)
BILIRUBIN TOTAL: 0.5 mg/dL (ref 0.3–1.2)
BUN: 14 mg/dL (ref 6–23)
CHLORIDE: 106 mmol/L (ref 96–112)
CO2: 27 mmol/L (ref 19–32)
Calcium: 8.1 mg/dL — ABNORMAL LOW (ref 8.4–10.5)
Creatinine, Ser: 0.63 mg/dL (ref 0.50–1.35)
GFR calc Af Amer: >90 mL/min (ref >90)
GFR calc non Af Amer: >90 mL/min (ref >90)
Glucose, Bld: 143 mg/dL — ABNORMAL HIGH (ref 70–99)
Potassium: 3.5 mmol/L (ref 3.5–5.1)
SODIUM: 143 mmol/L (ref 135–145)
Total Protein: 5.5 g/dL — ABNORMAL LOW (ref 6.0–8.3)

## 2014-08-22 LAB — CBC
HCT: 25.1 % — ABNORMAL LOW (ref 39.0–52.0)
Hemoglobin: 7.7 g/dL — ABNORMAL LOW (ref 13.0–17.0)
MCH: 27 pg (ref 26.0–34.0)
MCHC: 30.7 g/dL (ref 30.0–36.0)
MCV: 88.1 fL (ref 78.0–100.0)
Platelets: 194 10*3/uL (ref 150–400)
RBC: 2.85 MIL/uL — ABNORMAL LOW (ref 4.22–5.81)
RDW: 16.5 % — ABNORMAL HIGH (ref 11.5–15.5)
WBC: 6.8 10*3/uL (ref 4.0–10.5)

## 2014-08-22 LAB — DIFFERENTIAL
BASOS ABS: 0 10*3/uL (ref 0.0–0.1)
Basophils Relative: 0 % (ref 0–1)
Eosinophils Absolute: 0 10*3/uL (ref 0.0–0.7)
Eosinophils Relative: 0 % (ref 0–5)
LYMPHS PCT: 16 % (ref 12–46)
Lymphs Abs: 1.1 10*3/uL (ref 0.7–4.0)
MONOS PCT: 10 % (ref 3–12)
Monocytes Absolute: 0.7 10*3/uL (ref 0.1–1.0)
NEUTROS ABS: 5 10*3/uL (ref 1.7–7.7)
Neutrophils Relative %: 74 % (ref 43–77)

## 2014-08-22 LAB — GLUCOSE, CAPILLARY
Glucose-Capillary: 149 mg/dL — ABNORMAL HIGH (ref 70–99)
Glucose-Capillary: 148 mg/dL — ABNORMAL HIGH (ref 70–99)
Glucose-Capillary: 119 mg/dL — ABNORMAL HIGH (ref 70–99)

## 2014-08-22 LAB — MAGNESIUM: MAGNESIUM: 2.2 mg/dL (ref 1.5–2.5)

## 2014-08-22 LAB — OCCULT BLOOD X 1 CARD TO LAB, STOOL: FECAL OCCULT BLD: POSITIVE — AB

## 2014-08-22 LAB — CLOSTRIDIUM DIFFICILE BY PCR: Toxigenic C. Difficile by PCR: NEGATIVE

## 2014-08-22 LAB — TRIGLYCERIDES: Triglycerides: 114 mg/dL (ref <150)

## 2014-08-22 LAB — PHOSPHORUS: Phosphorus: 2 mg/dL — ABNORMAL LOW (ref 2.3–4.6)

## 2014-08-22 MED ORDER — FAT EMULSION 20 % IV EMUL
240.0000 mL | INTRAVENOUS | Status: AC
  Administered 2014-08-22: 240 mL via INTRAVENOUS
  Filled 2014-08-22: qty 250

## 2014-08-22 MED ORDER — DEXTROSE 5 % IV SOLN
15.0000 mmol | Freq: Once | INTRAVENOUS | Status: AC
  Administered 2014-08-22: 15 mmol via INTRAVENOUS
  Filled 2014-08-22: qty 5

## 2014-08-22 MED ORDER — TRACE MINERALS CR-CU-F-FE-I-MN-MO-SE-ZN IV SOLN
INTRAVENOUS | Status: AC
  Administered 2014-08-22: 18:00:00 via INTRAVENOUS
  Filled 2014-08-22: qty 960

## 2014-08-22 NOTE — Progress Notes (Signed)
Patient had 3 watery "diarrhea" stools by 0100 this morning since 1900 4/29-sent stool for c diff per protocol. Results NEGATIVE.

## 2014-08-22 NOTE — Progress Notes (Signed)
Patient ID: Wesley Dillon, male   DOB: 22-Jan-1946, 69 y.o.   MRN: [id number]    Subjective: Feels much better than admission. He has had a lot of flatus and bowel movement. Currently denies pain. NG tube has been removed.  Objective: Vital signs in last 24 hours: Temp:  [98.5 F (36.9 C)-98.9 F (37.2 C)] 98.9 F (37.2 C) Aug 27, 2022 0528) Pulse Rate:  [65-91] 91 Aug 27, 2022 0924) Resp:  [16] 16 08/27/22 0528) BP: (122-140)/(62-75) 137/75 mmHg 08/27/22 0924) SpO2:  [94 %-98 %] 95 % 08-27-22 0528) Last BM Date: 08-27-14  Intake/Output from previous day: 04/29 0701 - Aug 27, 2022 0700 In: 8902.3 [P.O.:90; I.V.:8175; TPN:637.3] Out: 2500 [Emesis/NG output:2500] Intake/Output this shift:    General appearance: alert, cooperative, no distress and very thin GI: mild distention but soft and nontender  Lab Results:   Recent Labs  08/21/14 0500 Aug 27, 2014 0408  WBC 6.9 6.8  HGB 9.1* 7.7*  HCT 29.6* 25.1*  PLT 281 194   BMET  Recent Labs  08/21/14 0500 08/27/2014 0408  NA 140 143  K 4.1 3.5  CL 103 106  CO2 25 27  GLUCOSE 109* 143*  BUN 16 14  CREATININE 0.81 0.63  CALCIUM 8.3* 8.1*     Studies/Results: Dg Abd 2 Views  Aug 27, 2014   CLINICAL DATA:  Abdominal distention.  Recent bowel obstruction.  EXAM: ABDOMEN - 2 VIEW  COMPARISON:  August 21, 2014  FINDINGS: Supine and upright images were obtained. There is much less bowel gas compared to 1 day prior. There is no bowel dilatation currently. There are scattered air-fluid levels. Nasogastric tube tip is in the proximal stomach with the side port slightly proximal to the gastroesophageal junction. No free air apparent. There are small phleboliths in the pelvis.  IMPRESSION: Considerably less small bowel gas compared to 1 day prior. No bowel dilatation. Scattered air-fluid levels raise question of a residual degree of obstruction. This appearance also could be indicative of underlying enteritis.  The nasogastric tube tip is in the proximal stomach  with the side port slightly above the gastroesophageal junction. Suggest advancing nasogastric tube approximately 6-7 cm to insure placement of both nasogastric tube tip and side port below the gastroesophageal junction.   Electronically Signed   By: Lowella Grip III M.D.   On: 08/27/2014 08:11   Dg Abd 2 Views  08/21/2014   CLINICAL DATA:  Small bowel obstruction  EXAM: ABDOMEN - 2 VIEW  COMPARISON:  08/20/2014  FINDINGS: Nasogastric tube tip terminates over the expected location of the body of the stomach. Multiple differential air-fluid levels are identified with small bowel dilatation measuring 6.2 cm maximally, increased from previously. Colon is decompressed. Mild leftward curvature of the lumbar spine centered at L3 is noted. Patchy bibasilar probable atelectasis noted. No free air beneath the diaphragms.  IMPRESSION: Increase in caliber of dilated small bowel loops compatible with mid small bowel obstruction.   Electronically Signed   By: Conchita Paris M.D.   On: 08/21/2014 08:49   Dg Abd 2 Views  08/20/2014   CLINICAL DATA:  Small bowel obstruction. Abdominal pain and distention. 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  EXAM: ABDOMEN - 2 VIEW  COMPARISON:  08/18/2014  FINDINGS: NG tube is now in place in the fundus of the stomach. Multiple stairstep air-fluid levels remain in the small bowel consistent with small bowel obstruction. There is a small amount of air in the nondistended colon.  Small bilateral pleural effusions.  IMPRESSION: Persistent small bowel obstruction,  essentially unchanged.   Electronically Signed   By: Lorriane Shire M.D.   On: 08/20/2014 11:51    Anti-infectives: Anti-infectives    None      Assessment/Plan: Abdominal carcinomatosis presenting with small bowel obstruction. He clinically is much improved and x-ray has normalized. NG tube out. Activity encouraged and limit to sips of clear liquids today. Continue TNA.    LOS: 4 days     Maria Coin T 08/22/2014

## 2014-08-22 NOTE — Progress Notes (Signed)
PARENTERAL NUTRITION CONSULT NOTE - follow up  Pharmacy Consult for TPN Indication: SBO in setting of abdominal carcinomatosis  No Known Allergies  Patient Measurements: Height: 6\' 1"  (185.4 cm) Weight: 173 lb (78.472 kg) IBW/kg (Calculated) : 79.9   Vital Signs: Temp: 98.9 F (37.2 C) (04/30 0528) Temp Source: Oral (04/30 0528) BP: 140/67 mmHg (04/30 0528) Pulse Rate: 81 (04/30 0528) Intake/Output from previous day: 04/29 0701 - 04/30 0700 In: -  Out: 1800 [Emesis/NG output:1800] Intake/Output from this shift:    Labs:  Recent Labs  08/21/14 0500 08/22/14 0408  WBC 6.9 6.8  HGB 9.1* 7.7*  HCT 29.6* 25.1*  PLT 281 194     Recent Labs  08/21/14 0500 08/22/14 0408  NA 140 143  K 4.1 3.5  CL 103 106  CO2 25 27  GLUCOSE 109* 143*  BUN 16 14  CREATININE 0.81 0.63  CALCIUM 8.3* 8.1*  MG 2.2 2.2  PHOS 2.2* 2.0*  PROT  --  5.5*  ALBUMIN  --  2.9*  AST  --  23  ALT  --  13  ALKPHOS  --  60  BILITOT  --  0.5  Corrected Calcium 9.0  Estimated Creatinine Clearance: 99.5 mL/min (by C-G formula based on Cr of 0.63).    Recent Labs  08/20/14 0726 08/21/14 2155 08/22/14 0522  GLUCAP 105* 125* 149*    Medical History: Past Medical History  Diagnosis Date  . Tremor     takes Primidone 250 once daily and inderall 160 daily for this-has had it since he was 21  . Pneumonia 06/2012  . Macular degeneration   . Benign essential tremor   . Coronary artery disease   . Anemia 06/30/2014  . 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 Essential hypertension 08/18/2014    Medications:  Scheduled:  . aspirin EC  81 mg Oral Daily  . docusate sodium  100 mg Oral BID  . insulin aspart  0-9 Units Subcutaneous Q8H  . losartan  25 mg Oral Daily  . potassium phosphate IVPB (mmol)  15 mmol Intravenous Once  . primidone  250 mg Oral q morning - 10a  . propranolol ER  80 mg Oral Daily   Infusions:  . 0.9 % NaCl with KCl 20 mEq / L 1,000 mL (08/22/14 0644)  . fat  emulsion 240 mL (08/21/14 1800)  . sodium chloride 0.9 % 1,000 mL infusion 500 mL/hr at 08/21/14 0635  . Marland KitchenTPN (CLINIMIX-E) Adult 40 mL/hr at 08/21/14 1759   PRN: acetaminophen **OR** acetaminophen, albuterol, HYDROmorphone (DILAUDID) injection, menthol-cetylpyridinium, ondansetron **OR** ondansetron (ZOFRAN) IV, phenol, zolpidem  Insulin Requirements:  Received 1 Unit in the past 24 hours  Current Nutrition:  NPO  IVF:  NS + KCl 20 mEq/L at 100 mL/hr  Central access:  Implanted port (08/18/14)  TPN start date:  08/21/14  ASSESSMENT                                                                                                          HPI:  69 y/o M  with abdominal carcinomatosis of undetermined primary, s/p 3 cycles of FOLFOX, admitted 08/18/14 with SBO.  This has persisted despite decompression with NGT and surgical intervention is being considered.  Orders received to begin TPN  with pharmacy assistance.  Significant events:  4/29: NG output remains high (~2.5L/day)  Today:   Glucose - slightly elevated at baseline  Electrolytes - WNL except phosphorus low, despite replacement yesterday, K decreased   Renal - SCr and BUN WNL  LFTs - WNL at baseline  TGs - in process  Prealbumin - in process  NUTRITIONAL GOALS                                                                                             Per RD assessment 08/19/14: 2100-2300 kCal/day,  115-125 grams protein per day  Clinimix-E 5/15 at a goal rate of 100 ml/hr + 20% fat emulsion at 10 ml/hr to provide: 120 g/day protein, 2184 Kcal/day.  PLAN                                                                                                                         1. K Phos 15 mM IV x 1 today 2. At 1800 today:  continue Clinimix-E 5/15 at 40 ml/hr, will advance once electrolytes normalize  continue 20% fat emulsion at 10 ml/hr.  TPN to contain standard multivitamins and trace elements, plus folic acid 1  mg/day and thiamine 100 mg /day as previously ordered PO.  Continue IVF to 100 ml/hr.  Check CBG q8h and cover with SSI Novolog "sensitive scale" if needed 3. Plan to advance TPN to goal rate over next several days as tolerated. 4. Bmet ,mag and phos tomorrow 5. Follow clinical course.   Dolly Rias RPh 08/22/2014, 9:22 AM Pager [phone]

## 2014-08-22 NOTE — Progress Notes (Signed)
Removed NG tube per Dr. orders. Pt. Tolerated well. Instructed pt. to notify RN for any nausea or vomiting.

## 2014-08-22 NOTE — Plan of Care (Signed)
Problem: Phase III Progression Outcomes Goal: Activity at appropriate level-compared to baseline (UP IN CHAIR FOR HEMODIALYSIS)  Outcome: Progressing Patient ambulated in hallway twice before bedtime. Each time patient did 3 laps around unit.

## 2014-08-22 NOTE — Progress Notes (Signed)
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 Small bowel 1ry on 3rd of 5th cycle FOLFOX under care from Dr. [name] admitted 08/18/14 with likely SBO 2/2 to Carcinomatous changes Dr. [name] of general surgery was consulted and patient had an NG tube placed to decompress   Past medical history-As per Problem list Chart reviewed as below-   Consultants:  General surgery  Oncology   Antibiotics:  None    Subjective   doing better. NG tube out Tolerating some sips + stool and + flatus   Objective    Interim History:   Telemetry: Non-telemetry   Objective: Filed Vitals:   08/21/14 2115 08/22/14 0528 08/22/14 0924 08/22/14 1445  BP: 134/72 140/67 137/75 110/64  Pulse: 74 81 91 73  Temp: 98.5 F (36.9 C) 98.9 F (37.2 C)  99.5 F (37.5 C)  TempSrc: Oral Oral  Oral  Resp: 16 16  16   Height:      Weight:      SpO2: 94% 95%  95%    Intake/Output Summary (Last 24 hours) at 08/22/14 1725 Last data filed at 08/22/14 1446  Gross per 24 hour  Intake 10247.34 ml  Output   2000 ml  Net 8247.34 ml    Exam:  General: EOMI, significantly pale Cardio: S1-S2 no murmur rub or gallop  Respiratory: Clinically clear no added sound Abdomen: Softer and less distended compared to prior,Multiple bowel sounds heard   Neuro intact    Data Reviewed: Basic Metabolic Panel:  Recent Labs Lab 08/18/14 1833 08/19/14 0635 08/21/14 0500 08/22/14 0408  NA 133* 134* 140 143  K 4.1 4.3 4.1 3.5  CL 99 97 103 106  CO2 25 24 25 27   GLUCOSE 116* 118* 109* 143*  BUN 12 14 16 14   CREATININE 0.65 0.59 0.81 0.63  CALCIUM 8.2* 8.3* 8.3* 8.1*  MG  --   --  2.2 2.2  PHOS  --   --  2.2* 2.0*   Liver  Function Tests:  Recent Labs Lab 08/18/14 1833 08/19/14 0635 08/22/14 0408  AST 24 25 23   ALT 14 14 13   ALKPHOS 67 73 60  BILITOT 0.2* 0.7 0.5  PROT 5.8* 6.1 5.5*  ALBUMIN 3.1* 3.1* 2.9*   No results for input(s): LIPASE, AMYLASE in the last 168 hours. No results for input(s): AMMONIA in the last 168 hours. CBC:  Recent Labs Lab 08/18/14 1833 08/19/14 0635 08/21/14 0500 08/22/14 0408  WBC 2.5* 2.2* 6.9 6.8  NEUTROABS 1.3*  --   --  5.0  HGB 8.7* 9.2* 9.1* 7.7*  HCT 27.2* 29.1* 29.6* 25.1*  MCV 86.3 86.6 87.3 88.1  PLT 239 255 281 194   Cardiac Enzymes: No results for input(s): CKTOTAL, CKMB, CKMBINDEX, TROPONINI in the last 168 hours. BNP: Invalid input(s): POCBNP CBG:  Recent Labs Lab 08/19/14 0749 08/20/14 0726 08/21/14 2155 08/22/14 0522 08/22/14 1427  GLUCAP 113* 105* 125* 149* 148*    Recent Results (from the past 240 hour(s))  Urine culture     Status: None   Collection Time: 08/18/14  5:58 PM  Result Value Ref Range Status   Specimen Description URINE, CLEAN CATCH  Final   Special Requests NONE  Final   Colony Count NO GROWTH Performed at  [facility]   Final   Culture NO GROWTH Performed at [facility]   Final   Report Status 08/20/2014 FINAL  Final  Culture, blood (routine x 2)     Status: None (Preliminary result)   Collection Time: 08/18/14  6:28 PM  Result Value Ref Range Status   Specimen Description BLOOD LEFT ANTECUBITAL  Final   Special Requests BOTTLES DRAWN AEROBIC AND ANAEROBIC 5CC EACH  Final   Culture   Final           BLOOD CULTURE RECEIVED NO GROWTH TO DATE CULTURE WILL BE HELD FOR 5 DAYS BEFORE ISSUING A FINAL NEGATIVE REPORT Performed at [facility]    Report Status PENDING  Incomplete  Culture, blood (routine x 2)     Status: None (Preliminary result)   Collection Time: 08/18/14  6:30 PM  Result Value Ref Range Status   Specimen Description BLOOD CHEST PORT  Final   Special Requests BOTTLES  DRAWN AEROBIC AND ANAEROBIC 5CC EACH  Final   Culture   Final           BLOOD CULTURE RECEIVED NO GROWTH TO DATE CULTURE WILL BE HELD FOR 5 DAYS BEFORE ISSUING A FINAL NEGATIVE REPORT Performed at [facility]    Report Status PENDING  Incomplete  Clostridium Difficile by PCR     Status: None   Collection Time: 08/22/14  2:31 AM  Result Value Ref Range Status   C difficile by pcr NEGATIVE NEGATIVE Final     Studies:              All Imaging reviewed and is as per above notation   Scheduled Meds: . aspirin EC  81 mg Oral Daily  . docusate sodium  100 mg Oral BID  . insulin aspart  0-9 Units Subcutaneous Q8H  . losartan  25 mg Oral Daily  . primidone  250 mg Oral q morning - 10a  . propranolol ER  80 mg Oral Daily   Continuous Infusions: . 0.9 % NaCl with KCl 20 mEq / L 100 mL/hr at 08/22/14 1400  . fat emulsion 240 mL (08/21/14 1800)  . Marland KitchenTPN (CLINIMIX-E) Adult     And  . fat emulsion    . sodium chloride 0.9 % 1,000 mL infusion 500 mL/hr at 08/21/14 0635  . Marland KitchenTPN (CLINIMIX-E) Adult 40 mL/hr at 08/21/14 1759     Assessment/Plan:  1. Small bowel obstruction likely secondary to peritoneal carcinomatosis-appreciate general surgery input.  We will keep an NG tube As patient still has high volume output.  AXR 4/29 am confirms SBO resolving.  Hopefully 4 nonoperative management-appreciate oncology input.  Gen surgery started TPN 4/29. Appreciate gen surgery input 2. ? Small bowel adenocarcinoma-will need to continue to monitor as an outpatient. CT scan for restaging as per oncologist who is aware of his admission. Patient to complete 2 more cycles of FOLFOX. 3. Benign essential tremor-continue propanolol 80 daily 4. HTN-continue losartan 25 daily 5. Hyperlipidemia discontinue Lipitor a.m.   Code Status:Full Family Communication: discussed c wife bedside and son for 30 min and all questions answered Dispos pending resolution-hopeful in 1-2 days   70 min  Verneita Griffes,  MD  Pager [phone] 08/22/2014, 5:25 PM    LOS: 4 days

## 2014-08-23 DIAGNOSIS — I7121 Aneurysm of the ascending aorta, without rupture: Secondary | ICD-10-CM

## 2014-08-23 DIAGNOSIS — I712 Thoracic aortic aneurysm, without rupture: Secondary | ICD-10-CM

## 2014-08-23 HISTORY — DX: Thoracic aortic aneurysm, without rupture: I71.2

## 2014-08-23 HISTORY — DX: Aneurysm of the ascending aorta, without rupture: I71.21

## 2014-08-23 LAB — BASIC METABOLIC PANEL
Anion gap: 3 — ABNORMAL LOW (ref 5–15)
BUN: 10 mg/dL (ref 6–20)
CHLORIDE: 105 mmol/L (ref 101–111)
CO2: 26 mmol/L (ref 22–32)
CREATININE: 0.56 mg/dL — AB (ref 0.61–1.24)
Calcium: 7.4 mg/dL — ABNORMAL LOW (ref 8.9–10.3)
GFR calc non Af Amer: >60 mL/min (ref >60)
GLUCOSE: 124 mg/dL — AB (ref 70–99)
Potassium: 3.4 mmol/L — ABNORMAL LOW (ref 3.5–5.1)
Sodium: 134 mmol/L — ABNORMAL LOW (ref 135–145)

## 2014-08-23 LAB — MAGNESIUM: MAGNESIUM: 2 mg/dL (ref 1.7–2.4)

## 2014-08-23 LAB — PHOSPHORUS: Phosphorus: 2.3 mg/dL — ABNORMAL LOW (ref 2.5–4.6)

## 2014-08-23 LAB — GLUCOSE, CAPILLARY
Glucose-Capillary: 122 mg/dL — ABNORMAL HIGH (ref 70–99)
Glucose-Capillary: 109 mg/dL — ABNORMAL HIGH (ref 70–99)

## 2014-08-23 MED ORDER — SODIUM PHOSPHATE 3 MMOLE/ML IV SOLN
15.0000 mmol | Freq: Once | INTRAVENOUS | Status: AC
  Administered 2014-08-23: 15 mmol via INTRAVENOUS
  Filled 2014-08-23: qty 5

## 2014-08-23 MED ORDER — POTASSIUM CHLORIDE CRYS ER 20 MEQ PO TBCR
40.0000 meq | EXTENDED_RELEASE_TABLET | Freq: Once | ORAL | Status: AC
  Administered 2014-08-23: 40 meq via ORAL
  Filled 2014-08-23: qty 2

## 2014-08-23 MED ORDER — FAT EMULSION 20 % IV EMUL
250.0000 mL | INTRAVENOUS | Status: DC
  Administered 2014-08-23: 250 mL via INTRAVENOUS
  Filled 2014-08-23: qty 250

## 2014-08-23 MED ORDER — TRACE MINERALS CR-CU-F-FE-I-MN-MO-SE-ZN IV SOLN
INTRAVENOUS | Status: DC
  Administered 2014-08-23: 17:00:00 via INTRAVENOUS
  Filled 2014-08-23: qty 960

## 2014-08-23 NOTE — Progress Notes (Signed)
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 Small bowel 1ry on 3rd of 5th cycle FOLFOX under care from Dr. [name] admitted 08/18/14 with likely SBO 2/2 to Carcinomatous changes Dr. [name] of general surgery was consulted and patient had an NG tube placed to decompress Eventually passing stools 4/30 and NGT removed     Past medical history-As per Problem list Chart reviewed as below-   Consultants:  General surgery  Oncology   Antibiotics:  None    Subjective   Fair.  No SOB or n/v No cp Feels more bloated however Passing gas Stool x 2   Objective    Interim History:   Telemetry: Non-telemetry   Objective: Filed Vitals:   08/22/14 1445 08/22/14 2224 08/23/14 0431 08/23/14 1055  BP: 110/64 112/65 111/65 114/77  Pulse: 73 70 69 72  Temp: 99.5 F (37.5 C) 98.7 F (37.1 C) 98.4 F (36.9 C)   TempSrc: Oral Oral Oral   Resp: 16 20 18    Height:      Weight:      SpO2: 95% 97% 94%     Intake/Output Summary (Last 24 hours) at 08/23/14 1309 Last data filed at 08/23/14 0824  Gross per 24 hour  Intake 1670.01 ml  Output    350 ml  Net 1320.01 ml    Exam:  General: EOMI, significantly pale Cardio: S1-S2 no murmur rub or gallop  Respiratory: Clinically clear no added sound Abdomen: Softer and less distended compared to prior,Multiple bowel sounds heard   Neuro intact    Data Reviewed: Basic Metabolic Panel:  Recent Labs Lab 08/18/14 1833 08/19/14 0635 08/21/14 0500 08/22/14 0408 08/23/14 0420  NA 133* 134* 140 143 134*  K 4.1 4.3 4.1 3.5 3.4*  CL 99 97 103 106 105  CO2 25 24 25 27 26   GLUCOSE 116* 118* 109* 143* 124*  BUN 12 14 16 14 10   CREATININE 0.65 0.59  0.81 0.63 0.56*  CALCIUM 8.2* 8.3* 8.3* 8.1* 7.4*  MG  --   --  2.2 2.2 2.0  PHOS  --   --  2.2* 2.0* 2.3*   Liver Function Tests:  Recent Labs Lab 08/18/14 1833 08/19/14 0635 08/22/14 0408  AST 24 25 23   ALT 14 14 13   ALKPHOS 67 73 60  BILITOT 0.2* 0.7 0.5  PROT 5.8* 6.1 5.5*  ALBUMIN 3.1* 3.1* 2.9*   No results for input(s): LIPASE, AMYLASE in the last 168 hours. No results for input(s): AMMONIA in the last 168 hours. CBC:  Recent Labs Lab 08/18/14 1833 08/19/14 0635 08/21/14 0500 08/22/14 0408  WBC 2.5* 2.2* 6.9 6.8  NEUTROABS 1.3*  --   --  5.0  HGB 8.7* 9.2* 9.1* 7.7*  HCT 27.2* 29.1* 29.6* 25.1*  MCV 86.3 86.6 87.3 88.1  PLT 239 255 281 194   Cardiac Enzymes: No results for input(s): CKTOTAL, CKMB, CKMBINDEX, TROPONINI in the last 168 hours. BNP: Invalid input(s): POCBNP CBG:  Recent Labs Lab 08/21/14 2155 08/22/14 0522 08/22/14 1427 08/22/14 2221 08/23/14 0615  GLUCAP 125* 149* 148* 119* 122*    Recent Results (from the past 240 hour(s))  Urine culture     Status: None   Collection Time: 08/18/14  5:58 PM  Result Value Ref  Range Status   Specimen Description URINE, CLEAN CATCH  Final   Special Requests NONE  Final   Colony Count NO GROWTH Performed at [facility]   Final   Culture NO GROWTH Performed at [facility]   Final   Report Status 08/20/2014 FINAL  Final  Culture, blood (routine x 2)     Status: None (Preliminary result)   Collection Time: 08/18/14  6:28 PM  Result Value Ref Range Status   Specimen Description BLOOD LEFT ANTECUBITAL  Final   Special Requests BOTTLES DRAWN AEROBIC AND ANAEROBIC 5CC EACH  Final   Culture   Final           BLOOD CULTURE RECEIVED NO GROWTH TO DATE CULTURE WILL BE HELD FOR 5 DAYS BEFORE ISSUING A FINAL NEGATIVE REPORT Performed at [facility]    Report Status PENDING  Incomplete  Culture, blood (routine x 2)     Status: None (Preliminary result)   Collection Time:  08/18/14  6:30 PM  Result Value Ref Range Status   Specimen Description BLOOD CHEST PORT  Final   Special Requests BOTTLES DRAWN AEROBIC AND ANAEROBIC 5CC EACH  Final   Culture   Final           BLOOD CULTURE RECEIVED NO GROWTH TO DATE CULTURE WILL BE HELD FOR 5 DAYS BEFORE ISSUING A FINAL NEGATIVE REPORT Performed at [facility]    Report Status PENDING  Incomplete  Clostridium Difficile by PCR     Status: None   Collection Time: 08/22/14  2:31 AM  Result Value Ref Range Status   C difficile by pcr NEGATIVE NEGATIVE Final     Studies:              All Imaging reviewed and is as per above notation   Scheduled Meds: . aspirin EC  81 mg Oral Daily  . docusate sodium  100 mg Oral BID  . insulin aspart  0-9 Units Subcutaneous Q8H  . losartan  25 mg Oral Daily  . primidone  250 mg Oral q morning - 10a  . propranolol ER  80 mg Oral Daily  . sodium phosphate  Dextrose 5% IVPB  15 mmol Intravenous Once   Continuous Infusions: . Marland KitchenTPN (CLINIMIX-E) Adult 40 mL/hr at 08/22/14 1740   And  . fat emulsion 240 mL (08/22/14 1740)  . Marland KitchenTPN (CLINIMIX-E) Adult     And  . fat emulsion       Assessment/Plan:  1. Small bowel obstruction likely secondary to peritoneal carcinomatosis-appreciate general surgery input.  NG pulled on 4/30.  Monitor.  Continue TPN for now-LFT's + labs ordered for am.  2vw AXR ordered 2. 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  Re-staging as OP per Dr. [name] 3. Benign essential tremor-continue propanolol 80 daily 4. HTN-continue losartan 25 daily 5. Hyperlipidemia discontinue Lipitor a.m.   Code Status:Full Family Communication: discussed c wife bedsid Dispos pending resolution-hopeful in 1-2 days   15 min  Verneita Griffes, MD  Pager [phone] 08/23/2014, 1:09 PM    LOS: 5 days

## 2014-08-23 NOTE — Progress Notes (Signed)
PARENTERAL NUTRITION CONSULT NOTE - follow up  Pharmacy Consult for TPN Indication: SBO in setting of abdominal carcinomatosis  No Known Allergies  Patient Measurements: Height: 6\' 1"  (185.4 cm) Weight: 173 lb (78.472 kg) IBW/kg (Calculated) : 79.9   Vital Signs: Temp: 98.4 F (36.9 C) (05/01 0431) Temp Source: Oral (05/01 0431) BP: 111/65 mmHg (05/01 0431) Pulse Rate: 69 (05/01 0431) Intake/Output from previous day: 04/30 0701 - 05/01 0700 In: 1705 [P.O.:360; I.V.:726.7; IV Piggyback:255; TPN:363.3] Out: 600 [Urine:600] Intake/Output from this shift: Total I/O In: 220 [P.O.:220] Out: -   Labs:  Recent Labs  08/21/14 0500 08/22/14 0408  WBC 6.9 6.8  HGB 9.1* 7.7*  HCT 29.6* 25.1*  PLT 281 194     Recent Labs  08/21/14 0500 08/22/14 0408 08/23/14 0420  NA 140 143 134*  K 4.1 3.5 3.4*  CL 103 106 105  CO2 25 27 26   GLUCOSE 109* 143* 124*  BUN 16 14 10   CREATININE 0.81 0.63 0.56*  CALCIUM 8.3* 8.1* 7.4*  MG 2.2 2.2 2.0  PHOS 2.2* 2.0* 2.3*  PROT  --  5.5*  --   ALBUMIN  --  2.9*  --   AST  --  23  --   ALT  --  13  --   ALKPHOS  --  60  --   BILITOT  --  0.5  --   TRIG  --  114  --   Corrected Calcium 9.0  Estimated Creatinine Clearance: 99.5 mL/min (by C-G formula based on Cr of 0.56).    Recent Labs  08/22/14 1427 08/22/14 2221 08/23/14 0615  GLUCAP 148* 119* 122*    Medical History: Past Medical History  Diagnosis Date  . Tremor     takes Primidone 250 once daily and inderall 160 daily for this-has had it since he was 21  . Pneumonia 06/2012  . Macular degeneration   . Benign essential tremor   . Coronary artery disease   . Anemia 06/30/2014  . 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 Essential hypertension 08/18/2014    Medications:  Scheduled:  . aspirin EC  81 mg Oral Daily  . docusate sodium  100 mg Oral BID  . insulin aspart  0-9 Units Subcutaneous Q8H  . losartan  25 mg Oral Daily  . primidone  250 mg Oral q morning -  10a  . propranolol ER  80 mg Oral Daily   Infusions:  . Marland KitchenTPN (CLINIMIX-E) Adult 40 mL/hr at 08/22/14 1740   And  . fat emulsion 240 mL (08/22/14 1740)   PRN: acetaminophen **OR** acetaminophen, albuterol, HYDROmorphone (DILAUDID) injection, menthol-cetylpyridinium, ondansetron **OR** ondansetron (ZOFRAN) IV, phenol, zolpidem  Insulin Requirements:  Received 2 Unit yesterday Current Nutrition:  NPO  IVF:  NS + KCl 20 mEq/L at 100 mL/hr  Central access:  Implanted port (08/18/14)  TPN start date:  08/21/14  ASSESSMENT  HPI:  69 y/o M with abdominal carcinomatosis of undetermined primary, s/p 3 cycles of FOLFOX, admitted 08/18/14 with SBO.  This has persisted despite decompression with NGT and surgical intervention is being considered.  Orders received to begin TPN  with pharmacy assistance.  Significant events:  4/29: NG output remains high (~2.5L/day)  Today:   Glucose - slightly elevated at baseline  Electrolytes -Na 134, K 3.4, phos 2.3(despite replacement yesterday) CoCa 8.28  Renal - SCr and BUN WNL  LFTs - WNL at baseline  TGs - 114  Prealbumin - in process  NUTRITIONAL GOALS                                                                                             Per RD assessment 08/19/14: 2100-2300 kCal/day,  115-125 grams protein per day  Clinimix-E 5/15 at a goal rate of 100 ml/hr + 20% fat emulsion at 10 ml/hr to provide: 120 g/day protein, 2184 Kcal/day.  PLAN                                                                                                                         1. Na Phos 15 mM IV x 1  2. KCl 37meq po x1  3. At 1800 today:  continue Clinimix-E 5/15 at 40 ml/hr, will advance once electrolytes normalize  continue 20% fat emulsion at 10 ml/hr.  TPN to contain standard multivitamins and trace elements, plus folic acid 1 mg/day and  thiamine 100 mg /day as previously ordered PO.  MD discontinued fluids and started full liquid diet  Check CBG q8h and cover with SSI Novolog "sensitive scale" if needed 4. Plan to advance TPN to goal rate as tolerated. 5. Full TPN labs tomorrow 6. Follow clinical course.   Dolly Rias RPh 08/23/2014, 9:31 AM Pager [phone]

## 2014-08-23 NOTE — Progress Notes (Signed)
Patient ID: Wesley Dillon, male   DOB: Nov 16, 1945, 68 y.o.   MRN: [id number]    Subjective: Abdomen feels a little more distended today. No pain or nausea. He has continued to have some loose bowel movements and flatus  Objective: Vital signs in last 24 hours: Temp:  [98.4 F (36.9 C)-99.5 F (37.5 C)] 98.4 F (36.9 C) (05/01 0431) Pulse Rate:  [69-91] 69 (05/01 0431) Resp:  [16-20] 18 (05/01 0431) BP: (110-137)/(64-75) 111/65 mmHg (05/01 0431) SpO2:  [94 %-97 %] 94 % (05/01 0431) Last BM Date: 08/23/14  Intake/Output from previous day: 08/28/22 0701 - 05/01 0700 In: 1705 [P.O.:360; I.V.:726.7; IV Piggyback:255; TPN:363.3] Out: 600 [Urine:600] Intake/Output this shift: Total I/O In: 220 [P.O.:220] Out: -   General appearance: alert, cooperative, no distress , very thin GI: somewhat distended and tympanitic but not much different from yesterday  and nontender  Lab Results:   Recent Labs  08/21/14 0500 2014-08-28 0408  WBC 6.9 6.8  HGB 9.1* 7.7*  HCT 29.6* 25.1*  PLT 281 194   BMET  Recent Labs  08-28-2014 0408 08/23/14 0420  NA 143 134*  K 3.5 3.4*  CL 106 105  CO2 27 26  GLUCOSE 143* 124*  BUN 14 10  CREATININE 0.63 0.56*  CALCIUM 8.1* 7.4*     Studies/Results: Dg Abd 2 Views  2014-08-28   CLINICAL DATA:  Abdominal distention.  Recent bowel obstruction.  EXAM: ABDOMEN - 2 VIEW  COMPARISON:  August 21, 2014  FINDINGS: Supine and upright images were obtained. There is much less bowel gas compared to 1 day prior. There is no bowel dilatation currently. There are scattered air-fluid levels. Nasogastric tube tip is in the proximal stomach with the side port slightly proximal to the gastroesophageal junction. No free air apparent. There are small phleboliths in the pelvis.  IMPRESSION: Considerably less small bowel gas compared to 1 day prior. No bowel dilatation. Scattered air-fluid levels raise question of a residual degree of obstruction. This appearance also could be  indicative of underlying enteritis.  The nasogastric tube tip is in the proximal stomach with the side port slightly above the gastroesophageal junction. Suggest advancing nasogastric tube approximately 6-7 cm to insure placement of both nasogastric tube tip and side port below the gastroesophageal junction.   Electronically Signed   By: Lowella Grip III M.D.   On: 08-28-2014 08:11    Anti-infectives: Anti-infectives    None      Assessment/Plan: Carcinomatosis, admitted with partial small bowel obstruction. Clinically has improved and x-rays yesterday were normal. Feels a little more distended today. Still however having bowel movements and no nausea. He will go cautiously with full liquids today and see how he feels. Continue hyperalimentation. Repeat x-rays in a.m. If more symptomatic.    LOS: 5 days    Topher Buenaventura T 08/23/2014

## 2014-08-23 NOTE — Progress Notes (Signed)
Pt ambulating around unit 2-3x q 2 hr with wife,tolerating well. Diet advanced to soft, pt tolerated full liquids well.

## 2014-08-24 ENCOUNTER — Inpatient Hospital Stay (HOSPITAL_COMMUNITY): Payer: 59

## 2014-08-24 LAB — CULTURE, BLOOD (ROUTINE X 2)
CULTURE: NO GROWTH
Culture: NO GROWTH

## 2014-08-24 LAB — DIFFERENTIAL
BASOS PCT: 0 % (ref 0–1)
Basophils Absolute: 0 10*3/uL (ref 0.0–0.1)
EOS PCT: 1 % (ref 0–5)
Eosinophils Absolute: 0 10*3/uL (ref 0.0–0.7)
LYMPHS ABS: 0.9 10*3/uL (ref 0.7–4.0)
Lymphocytes Relative: 29 % (ref 12–46)
Monocytes Absolute: 0.5 10*3/uL (ref 0.1–1.0)
Monocytes Relative: 16 % — ABNORMAL HIGH (ref 3–12)
NEUTROS ABS: 1.6 10*3/uL — AB (ref 1.7–7.7)
NEUTROS PCT: 54 % (ref 43–77)

## 2014-08-24 LAB — COMPREHENSIVE METABOLIC PANEL
ALT: 27 U/L (ref 17–63)
AST: 46 U/L — ABNORMAL HIGH (ref 15–41)
Albumin: 2.5 g/dL — ABNORMAL LOW (ref 3.5–5.0)
Alkaline Phosphatase: 60 U/L (ref 38–126)
Anion gap: 6 (ref 5–15)
BUN: 10 mg/dL (ref 6–20)
CO2: 26 mmol/L (ref 22–32)
Calcium: 8.1 mg/dL — ABNORMAL LOW (ref 8.9–10.3)
Chloride: 100 mmol/L — ABNORMAL LOW (ref 101–111)
Creatinine, Ser: 0.38 mg/dL — ABNORMAL LOW (ref 0.61–1.24)
GFR calc non Af Amer: >60 mL/min (ref >60)
GLUCOSE: 118 mg/dL — AB (ref 70–99)
Potassium: 3.7 mmol/L (ref 3.5–5.1)
SODIUM: 132 mmol/L — AB (ref 135–145)
TOTAL PROTEIN: 5.2 g/dL — AB (ref 6.5–8.1)
Total Bilirubin: 0.1 mg/dL — ABNORMAL LOW (ref 0.3–1.2)

## 2014-08-24 LAB — TRIGLYCERIDES: TRIGLYCERIDES: 88 mg/dL (ref <150)

## 2014-08-24 LAB — CBC
HCT: 23.1 % — ABNORMAL LOW (ref 39.0–52.0)
Hemoglobin: 7.4 g/dL — ABNORMAL LOW (ref 13.0–17.0)
MCH: 27.9 pg (ref 26.0–34.0)
MCHC: 32 g/dL (ref 30.0–36.0)
MCV: 87.2 fL (ref 78.0–100.0)
PLATELETS: 141 10*3/uL — AB (ref 150–400)
RBC: 2.65 MIL/uL — ABNORMAL LOW (ref 4.22–5.81)
RDW: 16.7 % — AB (ref 11.5–15.5)
WBC: 3 10*3/uL — ABNORMAL LOW (ref 4.0–10.5)

## 2014-08-24 LAB — GLUCOSE, CAPILLARY
GLUCOSE-CAPILLARY: 134 mg/dL — AB (ref 70–99)
Glucose-Capillary: 118 mg/dL — ABNORMAL HIGH (ref 70–99)

## 2014-08-24 LAB — PHOSPHORUS: PHOSPHORUS: 2.7 mg/dL (ref 2.5–4.6)

## 2014-08-24 LAB — PREALBUMIN: Prealbumin: 8.9 mg/dL — ABNORMAL LOW (ref 18–38)

## 2014-08-24 LAB — MAGNESIUM: Magnesium: 1.9 mg/dL (ref 1.7–2.4)

## 2014-08-24 MED ORDER — HEPARIN SOD (PORK) LOCK FLUSH 100 UNIT/ML IV SOLN
500.0000 [IU] | Freq: Once | INTRAVENOUS | Status: DC
  Filled 2014-08-24: qty 5

## 2014-08-24 MED ORDER — DOCUSATE SODIUM 100 MG PO CAPS: 100.0000 mg | ORAL_CAPSULE | Freq: Two times a day (BID) | ORAL | Status: DC

## 2014-08-24 NOTE — Plan of Care (Signed)
Problem: Discharge Progression Outcomes Goal: Tolerating diet Outcome: Progressing Tolerated full liquids and soft diet 5/1

## 2014-08-24 NOTE — Plan of Care (Signed)
Problem: Phase III Progression Outcomes Goal: Activity at appropriate level-compared to baseline (UP IN CHAIR FOR HEMODIALYSIS)  Outcome: Completed/Met Date Met:  08/24/14 Ambulates around unit q2h or more frequently

## 2014-08-24 NOTE — Plan of Care (Signed)
Problem: Food- and Nutrition-Related Knowledge Deficit (NB-1.1) Goal: Nutrition education Formal process to instruct or train a patient/client in a skill or to impart knowledge to help patients/clients voluntarily manage or modify food choices and eating behavior to maintain or improve health. Outcome: Completed/Met Date Met:  08/24/14 Nutrition Education Note  RD consulted for nutrition education regarding a low fiber/soft diet.  RD provided "Low Fiber Nutrition Therapy" handout from the Academy of [employer]. Reviewed patient's dietary recall. Provided examples of low fiber foods and preparation methods. Discouraged intake of high fiber foods such as fresh fruits and vegetables as well as whole grains. Teach back method used.  Expect good compliance.  Body mass index is 22.83 kg/(m^2). Pt meets criteria for normal range based on current BMI.  Current diet order is soft diet, patient is consuming approximately 100% of meals at this time. Labs and medications reviewed. No further nutrition interventions warranted at this time. If additional nutrition issues arise, please re-consult RD.  [name] Bibles, MS, RD, LDN Pager: [phone] After Hours Pager: [phone]

## 2014-08-24 NOTE — Progress Notes (Addendum)
Subjective: Tolerating soft diet. Having bowel movements. Denies pain or cramps. Feels a little distended but this is no different than he was 2 weeks ago. He's hoping that he can go home today. He is scheduled for another round of FOLFOX with Dr. Julieanne Manson tomorrow, and he is going to check with Dr. [name] to see if he wants to stay on schedule or wait.  Objective: Vital signs in last 24 hours: Temp:  [98 F (36.7 C)-99 F (37.2 C)] 98.4 F (36.9 C) (05/02 0435) Pulse Rate:  [66-78] 70 (05/02 0435) Resp:  [16-18] 16 (05/02 0435) BP: (110-123)/(70-77) 123/70 mmHg (05/02 0435) SpO2:  [95 %-98 %] 97 % (05/02 0435) Last BM Date: 08/23/14 (loose, x2)  Intake/Output from previous day: 05/01 0701 - 05/02 0700 In: 715 [P.O.:460; IV Piggyback:255] Out: 650 [Urine:650] Intake/Output this shift: Total I/O In: -  Out: 450 [Urine:450]    EXAM: General appearance: Alert. Ambulating independently. Looks quite comfortable. Good insight. GI: Soft and nontender. A bit distended, but I think this is going to be his baseline. Active bowel sounds. Ballotable mass right lower quadrant. No hernias.  Lab Results:   Recent Labs  Sep 20, 2014 0408 08/24/14 0437  WBC 6.8 3.0*  HGB 7.7* 7.4*  HCT 25.1* 23.1*  PLT 194 141*   BMET  Recent Labs  08/23/14 0420 08/24/14 0437  NA 134* 132*  K 3.4* 3.7  CL 105 100*  CO2 26 26  GLUCOSE 124* 118*  BUN 10 10  CREATININE 0.56* 0.38*  CALCIUM 7.4* 8.1*   PT/INR No results for input(s): LABPROT, INR in the last 72 hours. ABG No results for input(s): PHART, HCO3 in the last 72 hours.  Invalid input(s): PCO2, PO2  Studies/Results: Dg Abd 2 Views  09-20-14   CLINICAL DATA:  Abdominal distention.  Recent bowel obstruction.  EXAM: ABDOMEN - 2 VIEW  COMPARISON:  August 21, 2014  FINDINGS: Supine and upright images were obtained. There is much less bowel gas compared to 1 day prior. There is no bowel dilatation currently. There are  scattered air-fluid levels. Nasogastric tube tip is in the proximal stomach with the side port slightly proximal to the gastroesophageal junction. No free air apparent. There are small phleboliths in the pelvis.  IMPRESSION: Considerably less small bowel gas compared to 1 day prior. No bowel dilatation. Scattered air-fluid levels raise question of a residual degree of obstruction. This appearance also could be indicative of underlying enteritis.  The nasogastric tube tip is in the proximal stomach with the side port slightly above the gastroesophageal junction. Suggest advancing nasogastric tube approximately 6-7 cm to insure placement of both nasogastric tube tip and side port below the gastroesophageal junction.   Electronically Signed   By: Lowella Grip III M.D.   On: 20-Sep-2014 08:11    Anti-infectives: Anti-infectives    None      Assessment/Plan:  Malignant SBO. HD#7. Abdominal carcinomatosis, adenocarcinoma, primary unclear. Hard to know whether this is single or multiple areas.    At this point in time he seems to have a low-grade partial SBO and is asymptomatic without any sign of bowel compromise.  Wonder if terminal ileum, ileocecal valve is the primary. Prior CT scan and PET scan showed more mass effect in this area. Exam suggests ballotable mass RLQ.       There is no indication for surgical intervention from my point of view, and [name] certainly would like to avoid this unless it becomes a last resort.  He wants to go home today. I think he has done well from a GI standpoint for 48 hours and this seems reasonable.      He needs to follow-up with Julieanne Manson tomorrow.      He does not need to follow-up with surgery and less symptoms intervene.   Abdominal carcinomatosis. Omental biopsy confirms adenocarcinoma. Primary uncertain. PET scan shows multiple areas of hypermetabolic bowel wall thickening, possible primary in the terminal ileum, solitary right  liver metastasis, hypermetabolic nodular lung lesions. Status post 2 cycles of FOLFOX. Goals of treatment are disease control and palliation.  Suspect significant protein calorie malnutrition.   Discontinue TNA if discharged.  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   LOS: 6 days    Audy Dauphine M 08/24/2014

## 2014-08-24 NOTE — Discharge Summary (Signed)
Physician Discharge Summary  Wesley Dillon NOM:[id number] DOB: [birth year] DOA: 08/18/2014  PCP: Wesley Heck, MD  Admit date: 08/18/2014 Discharge date: 08/24/2014  Time spent: 35 minutes  Recommendations for Outpatient Follow-up:  1. Need CBC in 1 week-You might need a transfusion as your counts dropped a little in the Hopsital 2. Soft diet has been recommended  3. 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 Small bowel 1ry on 3rd of 5th cycle FOLFOX under care from Dr. Benay Dillon admitted 08/18/14 with likely SBO 2/2 to Carcinomatous changes Dr. [name] of general surgery was consulted and patient had an NG tube placed to decompress Eventually passing stools 4/30 and NGT removed   Hospital Course:   1. Small bowel obstruction likely secondary to peritoneal carcinomatosis-appreciate general surgery input. NG pulled on 4/30. Monitor. Was on TPN and this was d/c on d/c home. 2. Likely SBO AdenoCA -Patient to complete 2 more cycles of FOLFOX. Re-staging as OP per Dr. Benay Dillon 3. Benign essential tremor-continue propanolol 80 daily 4. HTN-continue losartan 25 daily 5. Hyperlipidemia discontinue Lipitor a.m.  Discharge Exam: Filed Vitals:   08/24/14 0435  BP: 123/70  Pulse: 70  Temp: 98.4 F (36.9 C)  Resp: 16    General: eomi Cardiovascular: s1 s2 no m/r/g Respiratory:  clear  Discharge  Instructions   Discharge Instructions    Diet - low sodium heart healthy    Complete by:  As directed      Discharge instructions    Complete by:  As directed   Please follow with your surgeon and oncologist Wesley Dillon-it was an honour taking care of you.  Call me if I can help with anything.  Take care     Increase activity slowly    Complete by:  As directed           Current Discharge Medication List    START taking these medications   Details  docusate sodium (COLACE) 100 MG capsule Take 1 capsule (100 mg total) by mouth 2 (two) times daily. Qty: 10 capsule, Refills: 0      CONTINUE these medications which have NOT CHANGED   Details  acetaminophen (TYLENOL) 500 MG tablet Take 1,000 mg by mouth every 6 (six) hours as needed for moderate pain or headache.    aspirin 81 MG tablet Take 81 mg by mouth daily.   Associated Diagnoses: Carcinoma of unknown primary    atorvastatin (LIPITOR) 10 MG tablet Take 10 mg by mouth every morning.     HYDROcodone-acetaminophen (NORCO/VICODIN) 5-325 MG per tablet Take 1-2 tablets by mouth every 4 (four) hours as needed for moderate pain. Qty: 30 tablet, Refills: 0    ibuprofen (ADVIL,MOTRIN) 200 MG tablet Take 800 mg by mouth every 6 (six) hours as needed for headache or moderate pain.    lidocaine-prilocaine (EMLA) cream Apply 1 application topically as needed. Apply to PAC 1-2 hours prior to stick and cover with plastic wrap Qty: 30 g,  Refills: 11   Associated Diagnoses: Adenocarcinoma carcinomatosis    losartan (COZAAR) 25 MG tablet Take 1 tablet (25 mg total) by mouth daily. Qty: 90 tablet, Refills: 3    Multiple Vitamins-Minerals (PRESERVISION AREDS PO) Take 2 capsules by mouth every morning.     ondansetron (ZOFRAN ODT) 4 MG disintegrating tablet Take 1 tablet (4 mg total) by mouth every 4 (four) hours as needed for nausea or vomiting. Qty: 20 tablet, Refills: 1    oxyCODONE (OXY IR/ROXICODONE) 5 MG immediate release tablet Take 1-2  tablets (5-10 mg total) by mouth every 4 (four) hours as needed for severe pain. Qty: 75 tablet, Refills: 0    primidone (MYSOLINE) 250 MG tablet Take 1 tablet (250 mg total) by mouth at bedtime. Qty: 90 tablet, Refills: 3    prochlorperazine (COMPAZINE) 10 MG tablet Take 1 tablet (10 mg total) by mouth every 6 (six) hours as needed for nausea. Qty: 60 tablet, Refills: 1   Associated Diagnoses: Adenocarcinoma carcinomatosis    propranolol ER (INDERAL LA) 80 MG 24 hr capsule Take 1 capsule (80 mg total) by mouth daily. Qty: 90 capsule, Refills: 3   Associated Diagnoses: Benign essential tremor    ipratropium (ATROVENT) 0.06 % nasal spray Place 2 sprays into both nostrils every 6 (six) hours as needed for rhinitis. Qty: 15 mL, Refills: 2       No Known Allergies    The results of significant diagnostics from this hospitalization (including imaging, microbiology, ancillary and laboratory) are listed below for reference.    Significant Diagnostic Studies: Dg Abd 2 Views  08/22/2014   CLINICAL DATA:  Abdominal distention.  Recent bowel obstruction.  EXAM: ABDOMEN - 2 VIEW  COMPARISON:  August 21, 2014  FINDINGS: Supine and upright images were obtained. There is much less bowel gas compared to 1 day prior. There is no bowel dilatation currently. There are scattered air-fluid levels. Nasogastric tube tip is in the proximal stomach with the side port slightly proximal to the gastroesophageal junction. No free air apparent. There are small phleboliths in the pelvis.  IMPRESSION: Considerably less small bowel gas compared to 1 day prior. No bowel dilatation. Scattered air-fluid levels raise question of a residual degree of obstruction. This appearance also could be indicative of underlying enteritis.  The nasogastric tube tip is in the proximal stomach with the side port slightly above the gastroesophageal junction. Suggest advancing nasogastric tube approximately 6-7 cm to insure placement of both  nasogastric tube tip and side port below the gastroesophageal junction.   Electronically Signed   By: Wesley Dillon III M.D.   On: 08/22/2014 08:11   Dg Abd 2 Views  08/21/2014   CLINICAL DATA:  Small bowel obstruction  EXAM: ABDOMEN - 2 VIEW  COMPARISON:  08/20/2014  FINDINGS: Nasogastric tube tip terminates over the expected location of the body of the stomach. Multiple differential air-fluid levels are identified with small bowel dilatation measuring 6.2 cm maximally, increased from previously. Colon is decompressed. Mild leftward curvature of the lumbar spine centered at L3 is noted. Patchy bibasilar probable atelectasis noted. No free air beneath the diaphragms.  IMPRESSION: Increase in caliber of dilated small bowel loops compatible with mid small bowel obstruction.   Electronically Signed   By: Conchita Paris M.D.   On: 08/21/2014 08:49   Dg Abd 2 Views  08/20/2014   CLINICAL DATA:  Small bowel obstruction. Abdominal pain and distention. 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  EXAM: ABDOMEN - 2 VIEW  COMPARISON:  08/18/2014  FINDINGS: NG tube is now in place in the fundus of the stomach. Multiple stairstep air-fluid levels remain in the small bowel consistent with small bowel obstruction. There is a small amount of air in the nondistended colon.  Small bilateral pleural effusions.  IMPRESSION: Persistent small bowel obstruction, essentially unchanged.   Electronically Signed   By: Lorriane Shire M.D.   On: 08/20/2014 11:51   Dg Abd Acute W/chest  08/18/2014   CLINICAL DATA:  Abdominal carcinomatosis with pain and nausea  EXAM: DG ABDOMEN ACUTE W/ 1V CHEST  COMPARISON:  Chest CT March 16, 2014; CT abdomen and pelvis June 30, 2014  FINDINGS: PA chest: Port-A-Cath tip is in the superior vena cava. There is no edema or consolidation. The heart size and pulmonary vascularity are normal. No adenopathy.  Supine and upright abdomen: There are multiple loops of dilated small  bowel with multiple air-fluid levels. No free air. There are a few small vascular calcifications in the pelvis.  IMPRESSION: The bowel gas pattern is consistent with obstruction. No free air. No lung edema or consolidation.   Electronically Signed   By: Wesley Dillon III M.D.   On: 08/18/2014 19:52    Microbiology: Recent Results (from the past 240 hour(s))  Urine culture     Status: None   Collection Time: 08/18/14  5:58 PM  Result Value Ref Range Status   Specimen Description URINE, CLEAN CATCH  Final   Special Requests NONE  Final   Colony Count NO GROWTH Performed at [facility]   Final   Culture NO GROWTH Performed at [facility]   Final   Report Status 08/20/2014 FINAL  Final  Culture, blood (routine x 2)     Status: None (Preliminary result)   Collection Time: 08/18/14  6:28 PM  Result Value Ref Range Status   Specimen Description BLOOD LEFT ANTECUBITAL  Final   Special Requests BOTTLES DRAWN AEROBIC AND ANAEROBIC 5CC EACH  Final   Culture   Final           BLOOD CULTURE RECEIVED NO GROWTH TO DATE CULTURE WILL BE HELD FOR 5 DAYS BEFORE ISSUING A FINAL NEGATIVE REPORT Performed at [facility]    Report Status PENDING  Incomplete  Culture, blood (routine x 2)     Status: None (Preliminary result)   Collection Time: 08/18/14  6:30 PM  Result Value Ref Range Status   Specimen Description BLOOD CHEST PORT  Final   Special Requests BOTTLES DRAWN AEROBIC AND ANAEROBIC 5CC EACH  Final   Culture   Final           BLOOD CULTURE RECEIVED NO GROWTH TO DATE CULTURE WILL BE HELD FOR 5 DAYS BEFORE ISSUING A FINAL NEGATIVE REPORT Performed at [facility]    Report Status PENDING  Incomplete  Clostridium Difficile by PCR     Status: None   Collection Time: 08/22/14  2:31 AM  Result Value Ref Range Status   C difficile by pcr NEGATIVE NEGATIVE Final     Labs: Basic Metabolic Panel:  Recent Labs Lab 08/19/14 0635 08/21/14 0500  08/22/14 0408 08/23/14 0420 08/24/14 0437  NA 134* 140 143 134* 132*  K 4.3 4.1 3.5 3.4* 3.7  CL 97 103 106 105 100*  CO2 24 25 27 26 26   GLUCOSE 118* 109* 143* 124* 118*  BUN 14 16 14 10 10   CREATININE 0.59 0.81 0.63 0.56* 0.38*  CALCIUM 8.3* 8.3*  8.1* 7.4* 8.1*  MG  --  2.2 2.2 2.0 1.9  PHOS  --  2.2* 2.0* 2.3* 2.7   Liver Function Tests:  Recent Labs Lab 08/18/14 1833 08/19/14 0635 08/22/14 0408 08/24/14 0437  AST 24 25 23  46*  ALT 14 14 13 27   ALKPHOS 67 73 60 60  BILITOT 0.2* 0.7 0.5 0.1*  PROT 5.8* 6.1 5.5* 5.2*  ALBUMIN 3.1* 3.1* 2.9* 2.5*   No results for input(s): LIPASE, AMYLASE in the last 168 hours. No results for input(s): AMMONIA in the last 168 hours. CBC:  Recent Labs Lab 08/18/14 1833 08/19/14 0635 08/21/14 0500 08/22/14 0408 08/24/14 0437  WBC 2.5* 2.2* 6.9 6.8 3.0*  NEUTROABS 1.3*  --   --  5.0 1.6*  HGB 8.7* 9.2* 9.1* 7.7* 7.4*  HCT 27.2* 29.1* 29.6* 25.1* 23.1*  MCV 86.3 86.6 87.3 88.1 87.2  PLT 239 255 281 194 141*   Cardiac Enzymes: No results for input(s): CKTOTAL, CKMB, CKMBINDEX, TROPONINI in the last 168 hours. BNP: BNP (last 3 results) No results for input(s): BNP in the last 8760 hours.  ProBNP (last 3 results) No results for input(s): PROBNP in the last 8760 hours.  CBG:  Recent Labs Lab 08/22/14 1427 08/22/14 2221 08/23/14 0615 08/23/14 2122 08/24/14 0605  GLUCAP 148* 119* 122* 109* 118*       Signed:  Nita Sells  [facility] 08/24/2014, 7:29 AM

## 2014-08-25 ENCOUNTER — Ambulatory Visit (HOSPITAL_BASED_OUTPATIENT_CLINIC_OR_DEPARTMENT_OTHER): Payer: 59

## 2014-08-25 ENCOUNTER — Ambulatory Visit: Payer: 59

## 2014-08-25 ENCOUNTER — Ambulatory Visit (HOSPITAL_BASED_OUTPATIENT_CLINIC_OR_DEPARTMENT_OTHER): Payer: 59 | Admitting: Oncology

## 2014-08-25 ENCOUNTER — Ambulatory Visit (HOSPITAL_COMMUNITY)
Admission: RE | Admit: 2014-08-25 | Discharge: 2014-08-25 | Disposition: A | Discharge status: Home / Self Care | Payer: 59 | Source: Ambulatory Visit | Attending: Oncology | Admitting: Oncology

## 2014-08-25 ENCOUNTER — Other Ambulatory Visit (HOSPITAL_BASED_OUTPATIENT_CLINIC_OR_DEPARTMENT_OTHER): Payer: 59

## 2014-08-25 VITALS — BP 99/63 | HR 74 | Temp 97.8°F | Resp 18 | Ht 73.0 in | Wt 179.7 lb

## 2014-08-25 DIAGNOSIS — C786 Secondary malignant neoplasm of retroperitoneum and peritoneum: Secondary | ICD-10-CM

## 2014-08-25 DIAGNOSIS — C801 Malignant (primary) neoplasm, unspecified: Secondary | ICD-10-CM | POA: Diagnosis not present

## 2014-08-25 DIAGNOSIS — Z5111 Encounter for antineoplastic chemotherapy: Secondary | ICD-10-CM

## 2014-08-25 DIAGNOSIS — R188 Other ascites: Secondary | ICD-10-CM | POA: Insufficient documentation

## 2014-08-25 DIAGNOSIS — D649 Anemia, unspecified: Secondary | ICD-10-CM

## 2014-08-25 DIAGNOSIS — Z95828 Presence of other vascular implants and grafts: Secondary | ICD-10-CM

## 2014-08-25 LAB — COMPREHENSIVE METABOLIC PANEL (CC13)
ALBUMIN: 2.5 g/dL — AB (ref 3.5–5.0)
ALT: 28 U/L (ref 0–55)
AST: 38 U/L — ABNORMAL HIGH (ref 5–34)
Alkaline Phosphatase: 82 U/L (ref 40–150)
Anion Gap: 9 mEq/L (ref 3–11)
BUN: 11.5 mg/dL (ref 7.0–26.0)
CO2: 22 mEq/L (ref 22–29)
Calcium: 8 mg/dL — ABNORMAL LOW (ref 8.4–10.4)
Chloride: 101 mEq/L (ref 98–109)
Creatinine: 0.6 mg/dL — ABNORMAL LOW (ref 0.7–1.3)
EGFR: >90 mL/min/{1.73_m2} (ref >90)
Glucose: 125 mg/dl (ref 70–140)
Potassium: 3.8 mEq/L (ref 3.5–5.1)
SODIUM: 132 meq/L — AB (ref 136–145)
Total Bilirubin: 0.4 mg/dL (ref 0.20–1.20)
Total Protein: 5.5 g/dL — ABNORMAL LOW (ref 6.4–8.3)

## 2014-08-25 LAB — CBC WITH DIFFERENTIAL/PLATELET
BASO%: 1.5 % (ref 0.0–2.0)
BASOS ABS: 0 10*3/uL (ref 0.0–0.1)
EOS ABS: 0 10*3/uL (ref 0.0–0.5)
EOS%: 1.5 % (ref 0.0–7.0)
HEMATOCRIT: 25.3 % — AB (ref 38.4–49.9)
HEMOGLOBIN: 8.2 g/dL — AB (ref 13.0–17.1)
LYMPH#: 0.6 10*3/uL — AB (ref 0.9–3.3)
LYMPH%: 25.7 % (ref 14.0–49.0)
MCH: 27.4 pg (ref 27.2–33.4)
MCHC: 32.3 g/dL (ref 32.0–36.0)
MCV: 84.9 fL (ref 79.3–98.0)
MONO#: 0.6 10*3/uL (ref 0.1–0.9)
MONO%: 23.7 % — ABNORMAL HIGH (ref 0.0–14.0)
NEUT%: 47.6 % (ref 39.0–75.0)
NEUTROS ABS: 1.2 10*3/uL — AB (ref 1.5–6.5)
Platelets: 240 10*3/uL (ref 140–400)
RBC: 2.98 10*6/uL — ABNORMAL LOW (ref 4.20–5.82)
RDW: 17.8 % — ABNORMAL HIGH (ref 11.0–14.6)
WBC: 2.5 10*3/uL — AB (ref 4.0–10.3)

## 2014-08-25 LAB — PREALBUMIN: Prealbumin: 9 mg/dL — ABNORMAL LOW (ref 21–43)

## 2014-08-25 LAB — RESEARCH LABS

## 2014-08-25 LAB — HOLD TUBE, BLOOD BANK

## 2014-08-25 MED ORDER — LEUCOVORIN CALCIUM INJECTION 350 MG
411.0000 mg/m2 | Freq: Once | INTRAVENOUS | Status: AC
  Administered 2014-08-25: 850 mg via INTRAVENOUS
  Filled 2014-08-25: qty 42.5

## 2014-08-25 MED ORDER — SODIUM CHLORIDE 0.9 % IV SOLN
2400.0000 mg/m2 | INTRAVENOUS | Status: DC
  Administered 2014-08-25: 4950 mg via INTRAVENOUS
  Filled 2014-08-25: qty 99

## 2014-08-25 MED ORDER — SODIUM CHLORIDE 0.9 % IJ SOLN
10.0000 mL | INTRAMUSCULAR | Status: DC | PRN
  Administered 2014-08-25: 10 mL via INTRAVENOUS
  Filled 2014-08-25: qty 10

## 2014-08-25 MED ORDER — PALONOSETRON HCL INJECTION 0.25 MG/5ML
INTRAVENOUS | Status: AC
  Filled 2014-08-25: qty 5

## 2014-08-25 MED ORDER — LIDOCAINE HCL (PF) 1 % IJ SOLN
INTRAMUSCULAR | Status: AC
  Filled 2014-08-25: qty 10

## 2014-08-25 MED ORDER — PALONOSETRON HCL INJECTION 0.25 MG/5ML
0.2500 mg | Freq: Once | INTRAVENOUS | Status: AC
  Administered 2014-08-25: 0.25 mg via INTRAVENOUS

## 2014-08-25 MED ORDER — FLUOROURACIL CHEMO INJECTION 2.5 GM/50ML
400.0000 mg/m2 | Freq: Once | INTRAVENOUS | Status: AC
  Administered 2014-08-25: 850 mg via INTRAVENOUS
  Filled 2014-08-25: qty 17

## 2014-08-25 MED ORDER — DEXTROSE 5 % IV SOLN
Freq: Once | INTRAVENOUS | Status: AC
  Administered 2014-08-25: 10:00:00 via INTRAVENOUS

## 2014-08-25 MED ORDER — DEXTROSE 5 % IV SOLN
85.0000 mg/m2 | Freq: Once | INTRAVENOUS | Status: AC
  Administered 2014-08-25: 175 mg via INTRAVENOUS
  Filled 2014-08-25: qty 35

## 2014-08-25 MED ORDER — SODIUM CHLORIDE 0.9 % IV SOLN
Freq: Once | INTRAVENOUS | Status: AC
  Administered 2014-08-25: 10:00:00 via INTRAVENOUS
  Filled 2014-08-25: qty 5

## 2014-08-25 NOTE — Progress Notes (Signed)
CBC reviewed by Dr. [name]: OK to treat despite ANC 1.2 pt will receive Neulasta. Pt worked in for paracentesis 3PM today at [facility]. Diane, Agricultural consultant in infusion will make pt aware.

## 2014-08-25 NOTE — Procedures (Signed)
Ultrasound Guided paracentesis  200 mls sent for cytology  2.8 Liters total removed  Patient tolerated procedure well.

## 2014-08-25 NOTE — Patient Instructions (Signed)

## 2014-08-25 NOTE — Progress Notes (Signed)
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 He reports no immediate nausea/vomiting following chemotherapy. He was admitted 08/18/2014 with nausea/vomiting and abdominal pain. He was diagnosed with a small bowel obstruction. The obstruction symptoms improved with bowel rest and NG tube decompression. He was seen by Dr. [name] in the hospital. He received intravenous hydration and TNA while in the hospital. He was discharged 08/24/2014. He is tolerating a soft diet and continues to have bowel movements. He complains of a distended abdomen.  Objective:  Vital signs in last 24 hours:  Blood pressure 99/63, pulse 74, temperature 97.8 F (36.6 C), temperature source Oral, resp. rate 18, height 6\' 1"  (1.854 m), weight 179 lb 11.2 oz (81.511 kg), SpO2 99 %.    HEENT: No thrush or ulcers Resp: Decreased breath sounds at the lower chest bilaterally, no respiratory distress Cardio: Regular rate and rhythm GI: Distended, no mass, nontender, no hepatosplenomegaly Vascular: Trace ankle edema bilaterally   Portacath/PICC-without erythema  Lab Results:  Lab Results  Component Value Date   WBC 2.5* 08/25/2014   HGB 8.2* 08/25/2014   HCT 25.3* 08/25/2014   MCV 84.9 08/25/2014   PLT 240 08/25/2014   NEUTROABS 1.2* 08/25/2014      Lab Results  Component Value Date   CEA 3.0 07/01/2014    Imaging:  US Paracentesis  08/25/2014   [name], PA-C     08/25/2014  4:22 PM Ultrasound Guided paracentesis  200 mls sent for cytology  2.8 Liters total removed  Patient tolerated procedure well.  Dg Abd 2 Views  08/24/2014   CLINICAL DATA:  Abdominal distension  EXAM: ABDOMEN - 2 VIEW  COMPARISON:  08/22/2014  FINDINGS: A nasogastric catheter is been removed in the interval. Scattered large and small bowel gas is noted. Few mildly dilated loops of small bowel are noted in the mid  abdomen. No free air is seen.  IMPRESSION: Interval removal of nasogastric catheter.  Mildly dilated central small bowel loops.   Electronically Signed   By: Inez Catalina M.D.   On: 08/24/2014 08:47   Dg Abd 2 Views  08/22/2014   CLINICAL DATA:  Abdominal distention.  Recent bowel obstruction.  EXAM: ABDOMEN - 2 VIEW  COMPARISON:  August 21, 2014  FINDINGS: Supine and upright images were obtained. There is much less bowel gas compared to 1 day prior. There is no bowel dilatation currently. There are scattered air-fluid levels. Nasogastric tube tip is in the proximal stomach with the side port slightly proximal to the gastroesophageal junction. No free air apparent. There are small phleboliths in the pelvis.  IMPRESSION: Considerably less small bowel gas compared to 1 day prior. No bowel dilatation. Scattered air-fluid levels raise question of a residual degree of obstruction. This appearance also could be indicative of underlying enteritis.  The nasogastric tube tip is in the proximal stomach with the side port slightly above the gastroesophageal junction. Suggest advancing nasogastric tube approximately 6-7 cm to insure placement of both nasogastric tube tip and side port below the gastroesophageal junction.   Electronically Signed   By: Lowella Grip III M.D.   On: 08/22/2014 08:11    Medications: I have reviewed the patient's current medications.  Assessment/Plan: 1. 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  CT 06/30/2014 consistent with a partial small bowel obstruction and abdominal carcinomatosis  Omentum biopsy 07/01/2014 confirmed adenocarcinoma, nonspecific  staining pattern  PET scan 07/07/2014 with multiple areas of hypermetabolic bowel wall thickening consistent with serosal implants from carcinomatosis, thickening and hypermetabolic activity at the terminal ileum felt to potentially represent a primary neoplasm, solitary right liver  metastasis, hypermetabolic nodular lung lesions  Cycle 1 FOLFOX 07/15/2014  Cycle 2 FOLFOX 07/28/2014  Cycle 3 FOLFOX 08/11/2014  Cycle 4 FOLFOX 08/25/2014  2. Abdominal pain secondary to #1, improved 3. Iron deficiency, Hemoccult positive stool 4. Partial small bowel obstruction-recurrent bowel obstruction symptoms crying hospital admission 08/18/2014 5. Bronchiectasis 6. 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 Nausea following cycle 1 FOLFOX, Emend and Aloxi added with cycle 2 8. 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 He has completed 3 cycles of FOLFOX. He understands the metastatic tumor may be progressing as evidenced by the recurrent bowel obstruction and development of ascites.  He has mild neutropenia today. We discussed the risk of infection. He agrees to proceed with chemotherapy and will receive Neulasta support. We reviewed the potential toxicities associated with Neulasta.  He is uncomfortable from abdominal distention. There may be significant ascites. He will be referred for a diagnostic/therapeutic paracentesis.  [name] will return for an office visit and chemotherapy in 2 weeks. The plan is to schedule a restaging CT evaluation after cycle 5 FOLFOX. Betsy Coder, MD  08/25/2014  4:55 PM

## 2014-08-25 NOTE — Patient Instructions (Addendum)
[facility] Discharge Instructions for Patients Receiving Chemotherapy  Today you received the following chemotherapy agents oxaliplatin,flurouracil, leucovorin.  To help prevent nausea and vomiting after your treatment, we encourage you to take your nausea medication as directed.   If you develop nausea and vomiting that is not controlled by your nausea medication, call the clinic.   BELOW ARE SYMPTOMS THAT SHOULD BE REPORTED IMMEDIATELY:  *FEVER GREATER THAN 100.5 F  *CHILLS WITH OR WITHOUT FEVER  NAUSEA AND VOMITING THAT IS NOT CONTROLLED WITH YOUR NAUSEA MEDICATION  *UNUSUAL SHORTNESS OF BREATH  *UNUSUAL BRUISING OR BLEEDING  TENDERNESS IN MOUTH AND THROAT WITH OR WITHOUT PRESENCE OF ULCERS  *URINARY PROBLEMS  *BOWEL PROBLEMS  UNUSUAL RASH Items with * indicate a potential emergency and should be followed up as soon as possible.  Feel free to call the clinic you have any questions or concerns. The clinic phone number is [phone].

## 2014-08-27 ENCOUNTER — Ambulatory Visit (HOSPITAL_BASED_OUTPATIENT_CLINIC_OR_DEPARTMENT_OTHER): Payer: 59

## 2014-08-27 ENCOUNTER — Ambulatory Visit: Payer: 59

## 2014-08-27 VITALS — BP 125/66 | HR 62 | Temp 97.6°F | Resp 18

## 2014-08-27 DIAGNOSIS — Z5189 Encounter for other specified aftercare: Secondary | ICD-10-CM

## 2014-08-27 DIAGNOSIS — C801 Malignant (primary) neoplasm, unspecified: Secondary | ICD-10-CM | POA: Diagnosis not present

## 2014-08-27 DIAGNOSIS — C786 Secondary malignant neoplasm of retroperitoneum and peritoneum: Secondary | ICD-10-CM | POA: Diagnosis not present

## 2014-08-27 MED ORDER — HEPARIN SOD (PORK) LOCK FLUSH 100 UNIT/ML IV SOLN
500.0000 [IU] | Freq: Once | INTRAVENOUS | Status: AC | PRN
  Administered 2014-08-27: 500 [IU]
  Filled 2014-08-27: qty 5

## 2014-08-27 MED ORDER — SODIUM CHLORIDE 0.9 % IJ SOLN
10.0000 mL | INTRAMUSCULAR | Status: DC | PRN
  Administered 2014-08-27: 10 mL
  Filled 2014-08-27: qty 10

## 2014-08-27 MED ORDER — PEGFILGRASTIM INJECTION 6 MG/0.6ML ~~LOC~~
6.0000 mg | PREFILLED_SYRINGE | Freq: Once | SUBCUTANEOUS | Status: AC
  Administered 2014-08-27: 6 mg via SUBCUTANEOUS
  Filled 2014-08-27: qty 0.6

## 2014-08-27 NOTE — Patient Instructions (Signed)
Pegfilgrastim injection What is this medicine? PEGFILGRASTIM (peg fil GRA stim) is a long-acting granulocyte colony-stimulating factor that stimulates the growth of neutrophils, a type of white blood cell important in the body's fight against infection. It is used to reduce the incidence of fever and infection in patients with certain types of cancer who are receiving chemotherapy that affects the bone marrow. This medicine may be used for other purposes; ask your health care provider or pharmacist if you have questions. COMMON BRAND NAME(S): Neulasta What should I tell my health care provider before I take this medicine? They need to know if you have any of these conditions: -latex allergy -ongoing radiation therapy -sickle cell disease -skin reactions to acrylic adhesives (On-Body Injector only) -an unusual or allergic reaction to pegfilgrastim, filgrastim, other medicines, foods, dyes, or preservatives -pregnant or trying to get pregnant -breast-feeding How should I use this medicine? This medicine is for injection under the skin. If you get this medicine at home, you will be taught how to prepare and give the pre-filled syringe or how to use the On-body Injector. Refer to the patient Instructions for Use for detailed instructions. Use exactly as directed. Take your medicine at regular intervals. Do not take your medicine more often than directed. It is important that you put your used needles and syringes in a special sharps container. Do not put them in a trash can. If you do not have a sharps container, call your pharmacist or healthcare provider to get one. Talk to your pediatrician regarding the use of this medicine in children. Special care may be needed. Overdosage: If you think you have taken too much of this medicine contact a poison control center or emergency room at once. NOTE: This medicine is only for you. Do not share this medicine with others. What if I miss a dose? It is  important not to miss your dose. Call your doctor or health care professional if you miss your dose. If you miss a dose due to an On-body Injector failure or leakage, a new dose should be administered as soon as possible using a single prefilled syringe for manual use. What may interact with this medicine? Interactions have not been studied. Give your health care provider a list of all the medicines, herbs, non-prescription drugs, or dietary supplements you use. Also tell them if you smoke, drink alcohol, or use illegal drugs. Some items may interact with your medicine. This list may not describe all possible interactions. Give your health care provider a list of all the medicines, herbs, non-prescription drugs, or dietary supplements you use. Also tell them if you smoke, drink alcohol, or use illegal drugs. Some items may interact with your medicine. What should I watch for while using this medicine? You may need blood work done while you are taking this medicine. If you are going to need a MRI, CT scan, or other procedure, tell your doctor that you are using this medicine (On-Body Injector only). What side effects may I notice from receiving this medicine? Side effects that you should report to your doctor or health care professional as soon as possible: -allergic reactions like skin rash, itching or hives, swelling of the face, lips, or tongue -dizziness -fever -pain, redness, or irritation at site where injected -pinpoint red spots on the skin -shortness of breath or breathing problems -stomach or side pain, or pain at the shoulder -swelling -tiredness -trouble passing urine Side effects that usually do not require medical attention (report to your doctor   or health care professional if they continue or are bothersome): -bone pain -muscle pain This list may not describe all possible side effects. Call your doctor for medical advice about side effects. You may report side effects to FDA at  1-800-FDA-1088. Where should I keep my medicine? Keep out of the reach of children. Store pre-filled syringes in a refrigerator between 2 and 8 degrees C (36 and 46 degrees F). Do not freeze. Keep in carton to protect from light. Throw away this medicine if it is left out of the refrigerator for more than 48 hours. Throw away any unused medicine after the expiration date. NOTE: This sheet is a summary. It may not cover all possible information. If you have questions about this medicine, talk to your doctor, pharmacist, or health care provider.  2015, [employer]vier/[employer] Standard. (2013-07-10 16:14:05)  

## 2014-08-31 ENCOUNTER — Encounter: Payer: Self-pay | Admitting: Oncology

## 2014-08-31 NOTE — Progress Notes (Signed)
Faxing matrix addl info form back to them.  [phone]

## 2014-09-06 ENCOUNTER — Inpatient Hospital Stay (HOSPITAL_COMMUNITY)
Admission: EM | Admit: 2014-09-06 | Discharge: 2014-09-18 | DRG: 329 | Disposition: A | Discharge status: Home / Self Care | Payer: 59 | Attending: Internal Medicine | Admitting: Internal Medicine

## 2014-09-06 ENCOUNTER — Encounter (HOSPITAL_COMMUNITY): Payer: Self-pay | Admitting: Emergency Medicine

## 2014-09-06 ENCOUNTER — Emergency Department (HOSPITAL_COMMUNITY): Payer: 59

## 2014-09-06 DIAGNOSIS — E43 Unspecified severe protein-calorie malnutrition: Secondary | ICD-10-CM | POA: Diagnosis present

## 2014-09-06 DIAGNOSIS — K5669 Other intestinal obstruction: Secondary | ICD-10-CM | POA: Diagnosis not present

## 2014-09-06 DIAGNOSIS — J479 Bronchiectasis, uncomplicated: Secondary | ICD-10-CM | POA: Diagnosis not present

## 2014-09-06 DIAGNOSIS — C801 Malignant (primary) neoplasm, unspecified: Secondary | ICD-10-CM | POA: Diagnosis not present

## 2014-09-06 DIAGNOSIS — I10 Essential (primary) hypertension: Secondary | ICD-10-CM | POA: Diagnosis present

## 2014-09-06 DIAGNOSIS — C786 Secondary malignant neoplasm of retroperitoneum and peritoneum: Secondary | ICD-10-CM | POA: Diagnosis present

## 2014-09-06 DIAGNOSIS — Z6822 Body mass index (BMI) 22.0-22.9, adult: Secondary | ICD-10-CM

## 2014-09-06 DIAGNOSIS — K56609 Unspecified intestinal obstruction, unspecified as to partial versus complete obstruction: Secondary | ICD-10-CM | POA: Diagnosis present

## 2014-09-06 DIAGNOSIS — H3530 Unspecified macular degeneration: Secondary | ICD-10-CM | POA: Diagnosis present

## 2014-09-06 DIAGNOSIS — R180 Malignant ascites: Secondary | ICD-10-CM | POA: Diagnosis present

## 2014-09-06 DIAGNOSIS — K913 Postprocedural intestinal obstruction: Secondary | ICD-10-CM | POA: Diagnosis present

## 2014-09-06 DIAGNOSIS — K5660 Unspecified intestinal obstruction: Secondary | ICD-10-CM | POA: Diagnosis present

## 2014-09-06 DIAGNOSIS — R05 Cough: Secondary | ICD-10-CM | POA: Diagnosis present

## 2014-09-06 DIAGNOSIS — D72829 Elevated white blood cell count, unspecified: Secondary | ICD-10-CM | POA: Diagnosis present

## 2014-09-06 DIAGNOSIS — G250 Essential tremor: Secondary | ICD-10-CM | POA: Diagnosis not present

## 2014-09-06 DIAGNOSIS — E222 Syndrome of inappropriate secretion of antidiuretic hormone: Secondary | ICD-10-CM | POA: Diagnosis present

## 2014-09-06 DIAGNOSIS — E876 Hypokalemia: Secondary | ICD-10-CM | POA: Diagnosis not present

## 2014-09-06 DIAGNOSIS — C179 Malignant neoplasm of small intestine, unspecified: Secondary | ICD-10-CM | POA: Diagnosis present

## 2014-09-06 DIAGNOSIS — R197 Diarrhea, unspecified: Secondary | ICD-10-CM | POA: Diagnosis not present

## 2014-09-06 DIAGNOSIS — D62 Acute posthemorrhagic anemia: Secondary | ICD-10-CM | POA: Diagnosis not present

## 2014-09-06 DIAGNOSIS — E871 Hypo-osmolality and hyponatremia: Secondary | ICD-10-CM | POA: Diagnosis not present

## 2014-09-06 DIAGNOSIS — I2510 Atherosclerotic heart disease of native coronary artery without angina pectoris: Secondary | ICD-10-CM | POA: Diagnosis present

## 2014-09-06 DIAGNOSIS — J9811 Atelectasis: Secondary | ICD-10-CM | POA: Diagnosis not present

## 2014-09-06 DIAGNOSIS — Z79899 Other long term (current) drug therapy: Secondary | ICD-10-CM | POA: Diagnosis not present

## 2014-09-06 DIAGNOSIS — C800 Disseminated malignant neoplasm, unspecified: Secondary | ICD-10-CM | POA: Diagnosis not present

## 2014-09-06 DIAGNOSIS — D638 Anemia in other chronic diseases classified elsewhere: Secondary | ICD-10-CM | POA: Diagnosis not present

## 2014-09-06 DIAGNOSIS — D509 Iron deficiency anemia, unspecified: Secondary | ICD-10-CM | POA: Diagnosis present

## 2014-09-06 DIAGNOSIS — E785 Hyperlipidemia, unspecified: Secondary | ICD-10-CM | POA: Diagnosis present

## 2014-09-06 DIAGNOSIS — R251 Tremor, unspecified: Secondary | ICD-10-CM | POA: Diagnosis present

## 2014-09-06 DIAGNOSIS — J90 Pleural effusion, not elsewhere classified: Secondary | ICD-10-CM | POA: Diagnosis not present

## 2014-09-06 DIAGNOSIS — E860 Dehydration: Secondary | ICD-10-CM | POA: Diagnosis present

## 2014-09-06 DIAGNOSIS — R140 Abdominal distension (gaseous): Secondary | ICD-10-CM

## 2014-09-06 DIAGNOSIS — D649 Anemia, unspecified: Secondary | ICD-10-CM | POA: Diagnosis not present

## 2014-09-06 LAB — CBC WITH DIFFERENTIAL/PLATELET
BASOS ABS: 0 10*3/uL (ref 0.0–0.1)
BASOS PCT: 0 % (ref 0–1)
Band Neutrophils: 8 % (ref 0–10)
EOS PCT: 0 % (ref 0–5)
Eosinophils Absolute: 0 10*3/uL (ref 0.0–0.7)
HCT: 31.2 % — ABNORMAL LOW (ref 39.0–52.0)
HEMOGLOBIN: 10 g/dL — AB (ref 13.0–17.0)
LYMPHS ABS: 2.2 10*3/uL (ref 0.7–4.0)
LYMPHS PCT: 8 % — AB (ref 12–46)
MCH: 27.7 pg (ref 26.0–34.0)
MCHC: 32.1 g/dL (ref 30.0–36.0)
MCV: 86.4 fL (ref 78.0–100.0)
MONO ABS: 1.9 10*3/uL — AB (ref 0.1–1.0)
MYELOCYTES: 4 %
Metamyelocytes Relative: 6 %
Monocytes Relative: 7 % (ref 3–12)
NEUTROS ABS: 23 10*3/uL — AB (ref 1.7–7.7)
Neutrophils Relative %: 66 % (ref 43–77)
Platelets: 258 10*3/uL (ref 150–400)
Promyelocytes Absolute: 1 %
RBC: 3.61 MIL/uL — ABNORMAL LOW (ref 4.22–5.81)
RDW: 19.6 % — AB (ref 11.5–15.5)
WBC: 27.1 10*3/uL — ABNORMAL HIGH (ref 4.0–10.5)

## 2014-09-06 LAB — COMPREHENSIVE METABOLIC PANEL
ALBUMIN: 3.1 g/dL — AB (ref 3.5–5.0)
ALT: 19 U/L (ref 17–63)
AST: 34 U/L (ref 15–41)
Alkaline Phosphatase: 117 U/L (ref 38–126)
Anion gap: 13 (ref 5–15)
BUN: 13 mg/dL (ref 6–20)
CO2: 24 mmol/L (ref 22–32)
CREATININE: 0.74 mg/dL (ref 0.61–1.24)
Calcium: 8.8 mg/dL — ABNORMAL LOW (ref 8.9–10.3)
Chloride: 96 mmol/L — ABNORMAL LOW (ref 101–111)
GFR calc Af Amer: >60 mL/min (ref >60)
Glucose, Bld: 126 mg/dL — ABNORMAL HIGH (ref 65–99)
POTASSIUM: 3.3 mmol/L — AB (ref 3.5–5.1)
SODIUM: 133 mmol/L — AB (ref 135–145)
Total Bilirubin: 0.4 mg/dL (ref 0.3–1.2)
Total Protein: 6.4 g/dL — ABNORMAL LOW (ref 6.5–8.1)

## 2014-09-06 LAB — LIPASE, BLOOD: Lipase: 32 U/L (ref 22–51)

## 2014-09-06 MED ORDER — ONDANSETRON HCL 4 MG/2ML IJ SOLN
4.0000 mg | Freq: Four times a day (QID) | INTRAMUSCULAR | Status: DC | PRN
  Administered 2014-09-09 – 2014-09-13 (×7): 4 mg via INTRAVENOUS
  Filled 2014-09-06 (×9): qty 2

## 2014-09-06 MED ORDER — ONDANSETRON HCL 4 MG/2ML IJ SOLN
4.0000 mg | Freq: Once | INTRAMUSCULAR | Status: AC
  Administered 2014-09-06: 4 mg via INTRAVENOUS
  Filled 2014-09-06: qty 2

## 2014-09-06 MED ORDER — ATORVASTATIN CALCIUM 10 MG PO TABS
10.0000 mg | ORAL_TABLET | Freq: Every morning | ORAL | Status: DC
  Administered 2014-09-07 – 2014-09-08 (×2): 10 mg via ORAL
  Filled 2014-09-06 (×4): qty 1

## 2014-09-06 MED ORDER — HYDROMORPHONE HCL 1 MG/ML IJ SOLN: 1.0000 mg | INTRAMUSCULAR | Status: DC | PRN

## 2014-09-06 MED ORDER — ENOXAPARIN SODIUM 40 MG/0.4ML ~~LOC~~ SOLN
40.0000 mg | SUBCUTANEOUS | Status: AC
  Administered 2014-09-06 – 2014-09-08 (×3): 40 mg via SUBCUTANEOUS
  Filled 2014-09-06 (×3): qty 0.4

## 2014-09-06 MED ORDER — DOCUSATE SODIUM 100 MG PO CAPS
100.0000 mg | ORAL_CAPSULE | Freq: Two times a day (BID) | ORAL | Status: DC
  Filled 2014-09-06 (×2): qty 1

## 2014-09-06 MED ORDER — POTASSIUM CHLORIDE IN NACL 20-0.9 MEQ/L-% IV SOLN
INTRAVENOUS | Status: DC
  Administered 2014-09-06 – 2014-09-07 (×3): via INTRAVENOUS
  Filled 2014-09-06 (×4): qty 1000

## 2014-09-06 MED ORDER — HYDROMORPHONE HCL 1 MG/ML IJ SOLN
1.0000 mg | Freq: Once | INTRAMUSCULAR | Status: AC
  Administered 2014-09-06: 1 mg via INTRAVENOUS
  Filled 2014-09-06: qty 1

## 2014-09-06 MED ORDER — ONDANSETRON HCL 4 MG PO TABS: 4.0000 mg | ORAL_TABLET | Freq: Four times a day (QID) | ORAL | Status: DC | PRN

## 2014-09-06 MED ORDER — PRIMIDONE 250 MG PO TABS
250.0000 mg | ORAL_TABLET | Freq: Every morning | ORAL | Status: DC
  Administered 2014-09-07 – 2014-09-08 (×2): 250 mg via ORAL
  Filled 2014-09-06 (×4): qty 1

## 2014-09-06 MED ORDER — SODIUM CHLORIDE 0.9 % IV SOLN
Freq: Once | INTRAVENOUS | Status: AC
  Administered 2014-09-06: 09:00:00 via INTRAVENOUS

## 2014-09-06 MED ORDER — LOSARTAN POTASSIUM 25 MG PO TABS
25.0000 mg | ORAL_TABLET | Freq: Every day | ORAL | Status: DC
  Administered 2014-09-07 – 2014-09-08 (×2): 25 mg via ORAL
  Filled 2014-09-06 (×4): qty 1

## 2014-09-06 MED ORDER — POLYETHYLENE GLYCOL 3350 17 G PO PACK: 17.0000 g | PACK | Freq: Every day | ORAL | Status: DC | PRN

## 2014-09-06 MED ORDER — FAMOTIDINE 20 MG PO TABS
20.0000 mg | ORAL_TABLET | Freq: Two times a day (BID) | ORAL | Status: DC
  Administered 2014-09-07 – 2014-09-08 (×4): 20 mg via ORAL
  Filled 2014-09-06 (×8): qty 1

## 2014-09-06 MED ORDER — ALUM & MAG HYDROXIDE-SIMETH 200-200-20 MG/5ML PO SUSP: 30.0000 mL | Freq: Four times a day (QID) | ORAL | Status: DC | PRN

## 2014-09-06 MED ORDER — PROPRANOLOL HCL ER 80 MG PO CP24
80.0000 mg | ORAL_CAPSULE | Freq: Every day | ORAL | Status: DC
  Administered 2014-09-07 – 2014-09-08 (×2): 80 mg via ORAL
  Filled 2014-09-06 (×4): qty 1

## 2014-09-06 NOTE — ED Provider Notes (Signed)
CSN: [id number]     Arrival date & time 09/06/14  1856 History   First MD Initiated Contact with Patient 09/06/14 [phone]     Chief Complaint  Patient presents with  . Emesis  . Abdominal Pain  . chemo card      (Consider location/radiation/quality/duration/timing/severity/associated sxs/prior Treatment) Patient is a 69 y.o. male presenting with vomiting and abdominal pain. The history is provided by the patient.  Emesis Severity:  Mild Duration:  12 hours Timing:  Constant Quality:  Stomach contents Progression:  Unchanged Chronicity:  Recurrent Relieved by:  Nothing Worsened by:  Nothing tried Ineffective treatments:  Antiemetics Associated symptoms: abdominal pain   Associated symptoms: no diarrhea and no headaches   Abdominal pain:    Location:  Generalized   Quality:  Fullness   Severity:  Mild   Onset quality:  Gradual   Duration:  12 hours   Timing:  Constant   Progression:  Unchanged   Chronicity:  Recurrent Abdominal Pain Associated symptoms: nausea and vomiting   Associated symptoms: no chest pain, no cough, no diarrhea, no dysuria, no fever, no hematuria and no shortness of breath     Past Medical History  Diagnosis Date  . Tremor     takes Primidone 250 once daily and inderall 160 daily for this-has had it since he was 21  . Pneumonia 06/2012  . Macular degeneration   . Benign essential tremor   . Coronary artery disease   . Anemia 06/30/2014  . 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 Essential hypertension 08/18/2014   Past Surgical History  Procedure Laterality Date  . Tonsillectomy    . Video bronchoscopy Bilateral 04/29/2013    Procedure: VIDEO BRONCHOSCOPY WITHOUT FLUORO;  Surgeon: Collene Gobble, MD;  Location: [facility];  Service: Cardiopulmonary;  Laterality: Bilateral;   Family History  Problem Relation Age of Onset  . CAD Father 16    Died MI  . Atrial fibrillation Father   . Asthma Father   . Alzheimer's disease Mother   . Diabetes  Mother   . Heart disease Mother   . Atrial fibrillation Mother   . Seizures Son     Died status epilepticus   History  Substance Use Topics  . Smoking status: Never Smoker   . Smokeless tobacco: Never Used  . Alcohol Use: No     Comment: wine with dinner    Review of Systems  Constitutional: Negative for fever.  HENT: Negative for drooling and rhinorrhea.   Eyes: Negative for pain.  Respiratory: Negative for cough and shortness of breath.   Cardiovascular: Negative for chest pain and leg swelling.  Gastrointestinal: Positive for nausea, vomiting and abdominal pain. Negative for diarrhea.  Genitourinary: Negative for dysuria and hematuria.  Musculoskeletal: Negative for gait problem and neck pain.  Skin: Negative for color change.  Neurological: Negative for numbness and headaches.  Hematological: Negative for adenopathy.  Psychiatric/Behavioral: Negative for behavioral problems.  All other systems reviewed and are negative.     Allergies  Review of patient's allergies indicates no known allergies.  Home Medications   Prior to Admission medications   Medication Sig Start Date End Date Taking? Authorizing Provider  acetaminophen (TYLENOL) 500 MG tablet Take 1,000 mg by mouth every 6 (six) hours as needed for moderate pain or headache.    Historical Provider, MD  aspirin 81 MG tablet Take 81 mg by mouth daily.    Historical Provider, MD  atorvastatin (LIPITOR) 10 MG tablet Take 10 mg  by mouth every morning.     Historical Provider, MD  docusate sodium (COLACE) 100 MG capsule Take 1 capsule (100 mg total) by mouth 2 (two) times daily. 08/24/14   Nita Sells, MD  HYDROcodone-acetaminophen (NORCO/VICODIN) 5-325 MG per tablet Take 1-2 tablets by mouth every 4 (four) hours as needed for moderate pain. 08/11/14   Ladell Pier, MD  ibuprofen (ADVIL,MOTRIN) 200 MG tablet Take 800 mg by mouth every 6 (six) hours as needed for headache or moderate pain.    Historical Provider,  MD  ipratropium (ATROVENT) 0.06 % nasal spray Place 2 sprays into both nostrils every 6 (six) hours as needed for rhinitis. Patient not taking: Reported on 08/11/2014 11/03/13   Tammy S Parrett, NP  lidocaine-prilocaine (EMLA) cream Apply 1 application topically as needed. Apply to [facility] 1-2 hours prior to stick and cover with plastic wrap 07/08/14   Ladell Pier, MD  losartan (COZAAR) 25 MG tablet Take 1 tablet (25 mg total) by mouth daily. 09/17/13   Sherren Mocha, MD  Multiple Vitamins-Minerals (PRESERVISION AREDS PO) Take 2 capsules by mouth every morning.     Historical Provider, MD  ondansetron (ZOFRAN ODT) 4 MG disintegrating tablet Take 1 tablet (4 mg total) by mouth every 4 (four) hours as needed for nausea or vomiting. 08/11/14   Ladell Pier, MD  oxyCODONE (OXY IR/ROXICODONE) 5 MG immediate release tablet Take 1-2 tablets (5-10 mg total) by mouth every 4 (four) hours as needed for severe pain. 07/28/14   Ladell Pier, MD  primidone (MYSOLINE) 250 MG tablet Take 1 tablet (250 mg total) by mouth at bedtime. Patient taking differently: Take 250 mg by mouth every morning.  10/21/13   Asencion Partridge Dohmeier, MD  prochlorperazine (COMPAZINE) 10 MG tablet Take 1 tablet (10 mg total) by mouth every 6 (six) hours as needed for nausea. 07/08/14   Ladell Pier, MD  propranolol ER (INDERAL LA) 80 MG 24 hr capsule Take 1 capsule (80 mg total) by mouth daily. 10/21/13   Asencion Partridge Dohmeier, MD  ranitidine (ZANTAC) 150 MG tablet Take 150 mg by mouth 2 (two) times daily.    Historical Provider, MD   BP 116/77 mmHg  Pulse 73  Temp(Src) 97.2 F (36.2 C) (Oral)  Resp 17  SpO2 98% Physical Exam  Constitutional: He is oriented to person, place, and time. He appears well-developed and well-nourished.  HENT:  Head: Normocephalic and atraumatic.  Right Ear: External ear normal.  Left Ear: External ear normal.  Nose: Nose normal.  Mouth/Throat: Oropharynx is clear and moist. No oropharyngeal exudate.  Eyes:  Conjunctivae and EOM are normal. Pupils are equal, round, and reactive to light.  Neck: Normal range of motion. Neck supple.  Cardiovascular: Normal rate, regular rhythm, normal heart sounds and intact distal pulses.  Exam reveals no gallop and no friction rub.   Pulmonary/Chest: Effort normal and breath sounds normal. No respiratory distress. He has no wheezes.  Abdominal: Soft. Bowel sounds are normal. He exhibits distension (mild to mod). There is no tenderness. There is no rebound and no guarding.  Diffuse mild tenderness to palpation of abdomen. Distant bowel sounds noted.  Musculoskeletal: Normal range of motion. He exhibits no edema or tenderness.  Neurological: He is alert and oriented to person, place, and time.  Skin: Skin is warm and dry.  Psychiatric: He has a normal mood and affect. His behavior is normal.  Nursing note and vitals reviewed.   ED Course  Procedures (including critical care  time) Labs Review Labs Reviewed  CBC WITH DIFFERENTIAL/PLATELET - Abnormal; Notable for the following:    WBC 27.1 (*)    RBC 3.61 (*)    Hemoglobin 10.0 (*)    HCT 31.2 (*)    RDW 19.6 (*)    Lymphocytes Relative 8 (*)    Neutro Abs 23.0 (*)    Monocytes Absolute 1.9 (*)    All other components within normal limits  COMPREHENSIVE METABOLIC PANEL - Abnormal; Notable for the following:    Sodium 133 (*)    Potassium 3.3 (*)    Chloride 96 (*)    Glucose, Bld 126 (*)    Calcium 8.8 (*)    Total Protein 6.4 (*)    Albumin 3.1 (*)    All other components within normal limits  LIPASE, BLOOD  URINALYSIS, ROUTINE W REFLEX MICROSCOPIC    Imaging Review Dg Abd 2 Views  09/06/2014   CLINICAL DATA:  Abdominal distention and pain.  EXAM: ABDOMEN - 2 VIEW  COMPARISON:  Aug 24, 2014.  FINDINGS: Severe small bowel dilatation with air-fluid levels is noted concerning for distal small bowel obstruction. There is no evidence of pneumoperitoneum. Phleboliths are noted in the pelvis.  IMPRESSION:  Severely dilated small bowel loops with air-fluid levels are noted concerning for distal small bowel obstruction.   Electronically Signed   By: Marijo Conception, M.D.   On: 09/06/2014 10:07     EKG Interpretation None      MDM   Final diagnoses:  Abdominal distension  SBO (small bowel obstruction)    9:04 AM 69 y.o. male w hx of adenocarcinoma carcinomatosis on chemo, CAD who presents with slight worsening abdominal distention, nausea, vomiting, and abdominal pain. 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 He was recently admitted several weeks ago for similar symptoms. He notes his symptoms are consistent with previous small bowel obst's. He denies any fevers. Has loose stools at baseline and had a small stool and flatulence yesterday. His symptoms began yesterday evening. Vital signs unremarkable here. We'll get screening lab work and plain film imaging of the abdomen.  Will admit to hospitalist. Discussed case w/ on call surgeon who will see the pt.     Pamella Pert, MD 09/06/14 [phone]

## 2014-09-06 NOTE — H&P (Signed)
[facility]  History and Physical Wesley Dillon L. Ardeth Perfect, MD Pager [phone] (if 7P to 7A, page night hospitalist on [email or web address] Dillon QMV:[id number] DOB: [birth year] DOA: 09/06/2014  Referring physician: ED  PCP: Gerrit Heck, MD   Chief Complaint: abdominal distension, n/v   HPI:  Pt is very pleasant 69 yo male w/ relatively new dx of abdominal carcinomatosis that is being treated by Dr Learta Codding w/ FOLFOX therapy, next dose due Tuesday . He presents to the ED after having developed intolerance to PO w/ nausea and emesis in association w/ his baseline of abdominal distension and a few recent admissions for SBO. He is cared for by Dr [name] w/ the surgery team. He states his Bowel Movements remain at baseline.   In the ED his pain was controlled w/ dilaudid at the time of my visit. His VSS. His labwork is significant for WBC of 27.1, but he reports receiving a neulasta injection 1.5 weeks prior to chemo induced neutropenia. He denies fevers, chills, watery diarrhea or any focal findings to suggest an infection. No rebound or guarding on abd exam . Otherwise he has mild hypokalemia and hyponatremia on labwork and is on NS in the ED for IVF.   A KUB was obtained that showed possible distal SBO and general surgery has been consulted by the ED. [facility] has been consulted for admission and management of his SBO .   Chart Review:  Prior oncology notes, prior admission notes; today's labs, images, notes  Review of Systems:  Negative except as noted in HPI   Past Medical History  Diagnosis Date  . Tremor     takes Primidone 250 once daily and inderall 160 daily for this-has had it since he was 21  . Pneumonia 06/2012  . Macular degeneration   . Benign essential tremor   . Coronary artery disease   . Anemia 06/30/2014  . 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 Essential hypertension 08/18/2014    Past Surgical History  Procedure Laterality Date  . Tonsillectomy    .  Video bronchoscopy Bilateral 04/29/2013    Procedure: VIDEO BRONCHOSCOPY WITHOUT FLUORO;  Surgeon: Collene Gobble, MD;  Location: [facility];  Service: Cardiopulmonary;  Laterality: Bilateral;    Social History:  reports that he has never smoked. He has never used smokeless tobacco. He reports that he does not drink alcohol or use illicit drugs.  No Known Allergies  Family History  Problem Relation Age of Onset  . CAD Father 16    Died MI  . Atrial fibrillation Father   . Asthma Father   . Alzheimer's disease Mother   . Diabetes Mother   . Heart disease Mother   . Atrial fibrillation Mother   . Seizures Son     Died status epilepticus     Prior to Admission medications   Medication Sig Start Date End Date Taking? Authorizing Provider  acetaminophen (TYLENOL) 500 MG tablet Take 1,000 mg by mouth every 6 (six) hours as needed for moderate pain or headache.   Yes Historical Provider, MD  atorvastatin (LIPITOR) 10 MG tablet Take 10 mg by mouth every morning.    Yes Historical Provider, MD  HYDROcodone-acetaminophen (NORCO/VICODIN) 5-325 MG per tablet Take 1-2 tablets by mouth every 4 (four) hours as needed for moderate pain. 08/11/14  Yes Ladell Pier, MD  ibuprofen (ADVIL,MOTRIN) 200 MG tablet Take 800 mg by mouth every 6 (six) hours as needed for headache or moderate pain.   Yes Historical Provider,  MD  lidocaine-prilocaine (EMLA) cream Apply 1 application topically as needed. Apply to [facility] 1-2 hours prior to stick and cover with plastic wrap 07/08/14  Yes Ladell Pier, MD  losartan (COZAAR) 25 MG tablet Take 1 tablet (25 mg total) by mouth daily. 09/17/13  Yes Sherren Mocha, MD  Multiple Vitamins-Minerals (PRESERVISION AREDS PO) Take 2 capsules by mouth every morning.    Yes Historical Provider, MD  ondansetron (ZOFRAN ODT) 4 MG disintegrating tablet Take 1 tablet (4 mg total) by mouth every 4 (four) hours as needed for nausea or vomiting. 08/11/14  Yes Ladell Pier, MD   oxyCODONE (OXY IR/ROXICODONE) 5 MG immediate release tablet Take 1-2 tablets (5-10 mg total) by mouth every 4 (four) hours as needed for severe pain. 07/28/14  Yes Ladell Pier, MD  PRESCRIPTION MEDICATION Chemo [facility]   Yes Historical Provider, MD  primidone (MYSOLINE) 250 MG tablet Take 1 tablet (250 mg total) by mouth at bedtime. Patient taking differently: Take 250 mg by mouth every morning.  10/21/13  Yes Asencion Partridge Dohmeier, MD  prochlorperazine (COMPAZINE) 10 MG tablet Take 1 tablet (10 mg total) by mouth every 6 (six) hours as needed for nausea. 07/08/14  Yes Ladell Pier, MD  propranolol ER (INDERAL LA) 80 MG 24 hr capsule Take 1 capsule (80 mg total) by mouth daily. 10/21/13  Yes Carmen Dohmeier, MD  ranitidine (ZANTAC) 150 MG tablet Take 150 mg by mouth 2 (two) times daily.   Yes Historical Provider, MD  docusate sodium (COLACE) 100 MG capsule Take 1 capsule (100 mg total) by mouth 2 (two) times daily. Patient not taking: Reported on 09/06/2014 08/24/14   Nita Sells, MD  ipratropium (ATROVENT) 0.06 % nasal spray Place 2 sprays into both nostrils every 6 (six) hours as needed for rhinitis. Patient not taking: Reported on 08/11/2014 11/03/13   Melvenia Needles, NP   Physical Exam: Filed Vitals:   09/06/14 0850 09/06/14 0852  BP: 116/77   Pulse: 73   Temp:  97.2 F (36.2 C)  TempSrc:  Oral  Resp: 17   SpO2: 98%      General:  Thin WM in NAD   HEENT: dry MM, anicteric   Cardiovascular: rrr, no mrg   Respiratory: ctab, no rales or crackles   Abdomen: distended but nontender and no rebound or guarding   Skin: warm , dry ,no rashes   Musculoskeletal: no focal deficits   Psychiatric: no anxiety/depression apparent today   Neurologic: no focal deficits   Wt Readings from Last 3 Encounters:  08/25/14 179 lb 11.2 oz (81.511 kg)  08/18/14 173 lb (78.472 kg)  08/11/14 181 lb 12.8 oz (82.464 kg)    Labs on Admission:  Basic Metabolic Panel:  Recent Labs Lab  09/06/14 0932  NA 133*  K 3.3*  CL 96*  CO2 24  GLUCOSE 126*  BUN 13  CREATININE 0.74  CALCIUM 8.8*    Liver Function Tests:  Recent Labs Lab 09/06/14 0932  AST 34  ALT 19  ALKPHOS 117  BILITOT 0.4  PROT 6.4*  ALBUMIN 3.1*    Recent Labs Lab 09/06/14 0932  LIPASE 32   No results for input(s): AMMONIA in the last 168 hours.  CBC:  Recent Labs Lab 09/06/14 0932  WBC 27.1*  NEUTROABS 23.0*  HGB 10.0*  HCT 31.2*  MCV 86.4  PLT 258    Cardiac Enzymes: No results for input(s): CKTOTAL, CKMB, CKMBINDEX, TROPONINI in the last 168 hours.  Troponin (  Point of Care Test) No results for input(s): TROPIPOC in the last 72 hours.  BNP (last 3 results) No results for input(s): PROBNP in the last 8760 hours.  CBG: No results for input(s): GLUCAP in the last 168 hours.   Radiological Exams on Admission: Dg Abd 2 Views  09/06/2014   CLINICAL DATA:  Abdominal distention and pain.  EXAM: ABDOMEN - 2 VIEW  COMPARISON:  Aug 24, 2014.  FINDINGS: Severe small bowel dilatation with air-fluid levels is noted concerning for distal small bowel obstruction. There is no evidence of pneumoperitoneum. Phleboliths are noted in the pelvis.  IMPRESSION: Severely dilated small bowel loops with air-fluid levels are noted concerning for distal small bowel obstruction.   Electronically Signed   By: Marijo Conception, M.D.   On: 09/06/2014 10:07       Active Problems:   Small bowel obstruction   Assessment/Plan 1. SBO - NPO. IVF w/ KCl. Dilaudid for pain. Holding on unessential PO meds. ED placed consultation to general surgery at this time .  2. Leukocytosis, acute - mainly neutrophilic response which would coincide w/ recent injection of neulasta by oncology. Will trend daily. No fevers or signs of infection at this time to warrant further w/u or abx. Monitor  3. Adenocarcinoma - will place on Dr Odis Hollingshead rounding list  4. HLD - home meds  5. HTN - home meds    Code Status:  full Family Communication: wife at bedside  Disposition Plan/Anticipated LOS: 3+ days   Time spent: 30  minutes  Velna Hatchet, MD  Internal Medicine Pager [phone] If 7PM-7AM, please contact night-coverage at [email or web address], password [facility] 09/06/2014, 10:49 AM

## 2014-09-06 NOTE — ED Notes (Signed)
Nurse drawing labs. 

## 2014-09-06 NOTE — ED Notes (Signed)
Pt states that last night been having generalized abd pain with  N/v. Pt states that his baseline is loose stools.  Pt last chemo treatment 2 weeks ago and is due again on Tuesday.

## 2014-09-06 NOTE — Consult Note (Signed)
Reason for Consult:  Recurrent SBO Referring Physician:  Dr. Golden Circle is an 69 y.o. male.  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 He has an adenocarcinoma. The primary felt to be gastrointestinal in nature. He's been receiving outpatient chemotherapy. He developed more distention and vomiting and subsequently presented to the hospital. He been tolerating some soft foods and having liquid bowel movements. He feels a little more distended. He is due to have another chemotherapy treatment next week.  No fever or chills.  He has intermittent cramping pain and the pain is not significantly worse than what he's had before.  He does not want to have a nasogastric tube placed at this time.  Past Medical History  Diagnosis Date  . Tremor     takes Primidone 250 once daily and inderall 160 daily for this-has had it since he was 21  . Pneumonia 06/2012  . Macular degeneration   . Benign essential tremor   . Coronary artery disease   . Anemia 06/30/2014  . 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 Essential hypertension 08/18/2014    Past Surgical History  Procedure Laterality Date  . Tonsillectomy    . Video bronchoscopy Bilateral 04/29/2013    Procedure: VIDEO BRONCHOSCOPY WITHOUT FLUORO;  Surgeon: Collene Gobble, MD;  Location: [facility];  Service: Cardiopulmonary;  Laterality: Bilateral;    Family History  Problem Relation Age of Onset  . CAD Father 39    Died MI  . Atrial fibrillation Father   . Asthma Father   . Alzheimer's disease Mother   . Diabetes Mother   . Heart disease Mother   . Atrial fibrillation Mother   . Seizures Son     Died status epilepticus    Social History:  reports that he has never smoked. He has never used smokeless tobacco. He reports that he does not drink alcohol or use illicit drugs.  Allergies: No Known Allergies  Prior to Admission medications   Medication Sig  Start Date End Date Taking? Authorizing Provider  acetaminophen (TYLENOL) 500 MG tablet Take 1,000 mg by mouth every 6 (six) hours as needed for moderate pain or headache.   Yes Historical Provider, MD  atorvastatin (LIPITOR) 10 MG tablet Take 10 mg by mouth every morning.    Yes Historical Provider, MD  HYDROcodone-acetaminophen (NORCO/VICODIN) 5-325 MG per tablet Take 1-2 tablets by mouth every 4 (four) hours as needed for moderate pain. 08/11/14  Yes Ladell Pier, MD  ibuprofen (ADVIL,MOTRIN) 200 MG tablet Take 800 mg by mouth every 6 (six) hours as needed for headache or moderate pain.   Yes Historical Provider, MD  lidocaine-prilocaine (EMLA) cream Apply 1 application topically as needed. Apply to [facility] 1-2 hours prior to stick and cover with plastic wrap 07/08/14  Yes Ladell Pier, MD  losartan (COZAAR) 25 MG tablet Take 1 tablet (25 mg total) by mouth daily. 09/17/13  Yes Sherren Mocha, MD  Multiple Vitamins-Minerals (PRESERVISION AREDS PO) Take 2 capsules by mouth every morning.    Yes Historical Provider, MD  ondansetron (ZOFRAN ODT) 4 MG disintegrating tablet Take 1 tablet (4 mg total) by mouth every 4 (four) hours as needed for nausea or vomiting. 08/11/14  Yes Ladell Pier, MD  oxyCODONE (OXY IR/ROXICODONE) 5 MG immediate release tablet Take 1-2 tablets (5-10 mg total) by mouth every 4 (four) hours as needed for severe pain. 07/28/14  Yes Izola Price  [name], MD  PRESCRIPTION MEDICATION Chemo [facility]   Yes Historical Provider, MD  primidone (MYSOLINE) 250 MG tablet Take 1 tablet (250 mg total) by mouth at bedtime. Patient taking differently: Take 250 mg by mouth every morning.  10/21/13  Yes Asencion Partridge Dohmeier, MD  prochlorperazine (COMPAZINE) 10 MG tablet Take 1 tablet (10 mg total) by mouth every 6 (six) hours as needed for nausea. 07/08/14  Yes Ladell Pier, MD  propranolol ER (INDERAL LA) 80 MG 24 hr capsule Take 1 capsule (80 mg total) by mouth daily. 10/21/13  Yes Carmen Dohmeier, MD   ranitidine (ZANTAC) 150 MG tablet Take 150 mg by mouth 2 (two) times daily.   Yes Historical Provider, MD  docusate sodium (COLACE) 100 MG capsule Take 1 capsule (100 mg total) by mouth 2 (two) times daily. Patient not taking: Reported on 09/06/2014 08/24/14   Nita Sells, MD  ipratropium (ATROVENT) 0.06 % nasal spray Place 2 sprays into both nostrils every 6 (six) hours as needed for rhinitis. Patient not taking: Reported on 08/11/2014 11/03/13   Melvenia Needles, NP      Results for orders placed or performed during the hospital encounter of 09/06/14 (from the past 48 hour(s))  CBC with Differential/Platelet     Status: Abnormal   Collection Time: 09/06/14  9:32 AM  Result Value Ref Range   WBC 27.1 (H) 4.0 - 10.5 K/uL   RBC 3.61 (L) 4.22 - 5.81 MIL/uL   Hemoglobin 10.0 (L) 13.0 - 17.0 g/dL   HCT 31.2 (L) 39.0 - 52.0 %   MCV 86.4 78.0 - 100.0 fL   MCH 27.7 26.0 - 34.0 pg   MCHC 32.1 30.0 - 36.0 g/dL   RDW 19.6 (H) 11.5 - 15.5 %   Platelets 258 150 - 400 K/uL   Neutrophils Relative % 66 43 - 77 %   Lymphocytes Relative 8 (L) 12 - 46 %   Monocytes Relative 7 3 - 12 %   Eosinophils Relative 0 0 - 5 %   Basophils Relative 0 0 - 1 %   Band Neutrophils 8 0 - 10 %   Metamyelocytes Relative 6 %   Myelocytes 4 %   Promyelocytes Absolute 1 %   Neutro Abs 23.0 (H) 1.7 - 7.7 K/uL   Lymphs Abs 2.2 0.7 - 4.0 K/uL   Monocytes Absolute 1.9 (H) 0.1 - 1.0 K/uL   Eosinophils Absolute 0.0 0.0 - 0.7 K/uL   Basophils Absolute 0.0 0.0 - 0.1 K/uL   RBC Morphology RARE NRBCs     Comment: POLYCHROMASIA PRESENT   WBC Morphology      MODERATE LEFT SHIFT (>5% METAS AND MYELOS,OCC PRO NOTED)  Comprehensive metabolic panel     Status: Abnormal   Collection Time: 09/06/14  9:32 AM  Result Value Ref Range   Sodium 133 (L) 135 - 145 mmol/L   Potassium 3.3 (L) 3.5 - 5.1 mmol/L   Chloride 96 (L) 101 - 111 mmol/L   CO2 24 22 - 32 mmol/L   Glucose, Bld 126 (H) 65 - 99 mg/dL   BUN 13 6 - 20 mg/dL    Creatinine, Ser 0.74 0.61 - 1.24 mg/dL   Calcium 8.8 (L) 8.9 - 10.3 mg/dL   Total Protein 6.4 (L) 6.5 - 8.1 g/dL   Albumin 3.1 (L) 3.5 - 5.0 g/dL   AST 34 15 - 41 U/L   ALT 19 17 - 63 U/L   Alkaline Phosphatase 117 38 - 126 U/L  Total Bilirubin 0.4 0.3 - 1.2 mg/dL   GFR calc non Af Amer >60 >60 mL/min   GFR calc Af Amer >60 >60 mL/min    Comment: (NOTE) The eGFR has been calculated using the CKD EPI equation. This calculation has not been validated in all clinical situations. eGFR's persistently <60 mL/min signify possible Chronic Kidney Disease.    Anion gap 13 5 - 15  Lipase, blood     Status: None   Collection Time: 09/06/14  9:32 AM  Result Value Ref Range   Lipase 32 22 - 51 U/L    Dg Abd 2 Views  09/06/2014   CLINICAL DATA:  Abdominal distention and pain.  EXAM: ABDOMEN - 2 VIEW  COMPARISON:  Aug 24, 2014.  FINDINGS: Severe small bowel dilatation with air-fluid levels is noted concerning for distal small bowel obstruction. There is no evidence of pneumoperitoneum. Phleboliths are noted in the pelvis.  IMPRESSION: Severely dilated small bowel loops with air-fluid levels are noted concerning for distal small bowel obstruction.   Electronically Signed   By: Marijo Conception, M.D.   On: 09/06/2014 10:07    Review of Systems  Constitutional: Negative for fever and chills.  Gastrointestinal: Positive for heartburn, nausea, vomiting and abdominal pain. Negative for blood in stool.  Genitourinary: Negative for hematuria.   Blood pressure 115/76, pulse 65, temperature 97.9 F (36.6 C), temperature source Oral, resp. rate 18, height 6' 1"  (1.854 m), weight 77.52 kg (170 lb 14.4 oz), SpO2 100 %. Physical Exam  Constitutional:  Thin male in NAD.  HENT:  Head: Normocephalic and atraumatic.  GI:  Firm and distended with hypoactive bowel sounds. No tenderness is present. No hernias are palpable.  Genitourinary:  No palpable groin hernias.  Neurological: He is alert.  Skin: Skin is  warm and dry.    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 I explained to him that if he had more nausea and vomiting, I suggested he have the nasogastric tube placed.  Plan: No need for urgent operation at this time.   Repeat x-rays in the morning.  Further talkative of palliative surgery at that time.  Jurni Cesaro J 09/06/2014, 1:11 PM

## 2014-09-07 ENCOUNTER — Inpatient Hospital Stay (HOSPITAL_COMMUNITY): Payer: 59

## 2014-09-07 DIAGNOSIS — D701 Agranulocytosis secondary to cancer chemotherapy: Secondary | ICD-10-CM

## 2014-09-07 DIAGNOSIS — K5660 Unspecified intestinal obstruction: Secondary | ICD-10-CM

## 2014-09-07 DIAGNOSIS — G893 Neoplasm related pain (acute) (chronic): Secondary | ICD-10-CM

## 2014-09-07 DIAGNOSIS — E611 Iron deficiency: Secondary | ICD-10-CM

## 2014-09-07 DIAGNOSIS — C786 Secondary malignant neoplasm of retroperitoneum and peritoneum: Secondary | ICD-10-CM

## 2014-09-07 DIAGNOSIS — J479 Bronchiectasis, uncomplicated: Secondary | ICD-10-CM

## 2014-09-07 DIAGNOSIS — R195 Other fecal abnormalities: Secondary | ICD-10-CM

## 2014-09-07 DIAGNOSIS — C801 Malignant (primary) neoplasm, unspecified: Secondary | ICD-10-CM

## 2014-09-07 LAB — COMPREHENSIVE METABOLIC PANEL
ALK PHOS: 104 U/L (ref 38–126)
ALT: 16 U/L — ABNORMAL LOW (ref 17–63)
AST: 25 U/L (ref 15–41)
Albumin: 2.5 g/dL — ABNORMAL LOW (ref 3.5–5.0)
Anion gap: 12 (ref 5–15)
BUN: 14 mg/dL (ref 6–20)
CHLORIDE: 101 mmol/L (ref 101–111)
CO2: 21 mmol/L — AB (ref 22–32)
Calcium: 7.8 mg/dL — ABNORMAL LOW (ref 8.9–10.3)
Creatinine, Ser: 0.85 mg/dL (ref 0.61–1.24)
GFR calc non Af Amer: >60 mL/min (ref >60)
Glucose, Bld: 107 mg/dL — ABNORMAL HIGH (ref 65–99)
Potassium: 3.3 mmol/L — ABNORMAL LOW (ref 3.5–5.1)
Sodium: 134 mmol/L — ABNORMAL LOW (ref 135–145)
Total Bilirubin: 0.5 mg/dL (ref 0.3–1.2)
Total Protein: 5.3 g/dL — ABNORMAL LOW (ref 6.5–8.1)

## 2014-09-07 LAB — CBC
HEMATOCRIT: 24.9 % — AB (ref 39.0–52.0)
HEMOGLOBIN: 8.1 g/dL — AB (ref 13.0–17.0)
MCH: 28 pg (ref 26.0–34.0)
MCHC: 32.5 g/dL (ref 30.0–36.0)
MCV: 86.2 fL (ref 78.0–100.0)
Platelets: 200 10*3/uL (ref 150–400)
RBC: 2.89 MIL/uL — ABNORMAL LOW (ref 4.22–5.81)
RDW: 19.9 % — ABNORMAL HIGH (ref 11.5–15.5)
WBC: 27.4 10*3/uL — AB (ref 4.0–10.5)

## 2014-09-07 LAB — GLUCOSE, CAPILLARY
Glucose-Capillary: 102 mg/dL — ABNORMAL HIGH (ref 65–99)
Glucose-Capillary: 130 mg/dL — ABNORMAL HIGH (ref 65–99)

## 2014-09-07 MED ORDER — TRACE MINERALS CR-CU-F-FE-I-MN-MO-SE-ZN IV SOLN
INTRAVENOUS | Status: AC
  Administered 2014-09-07: 18:00:00 via INTRAVENOUS
  Filled 2014-09-07: qty 960

## 2014-09-07 MED ORDER — POTASSIUM CHLORIDE IN NACL 20-0.9 MEQ/L-% IV SOLN
INTRAVENOUS | Status: DC
  Administered 2014-09-07: 19:00:00 via INTRAVENOUS
  Administered 2014-09-09: 1000 mL via INTRAVENOUS
  Administered 2014-09-09 – 2014-09-10 (×2): via INTRAVENOUS
  Filled 2014-09-07 (×7): qty 1000

## 2014-09-07 MED ORDER — FAT EMULSION 20 % IV EMUL
240.0000 mL | INTRAVENOUS | Status: AC
  Administered 2014-09-07: 240 mL via INTRAVENOUS
  Filled 2014-09-07: qty 250

## 2014-09-07 MED ORDER — INSULIN ASPART 100 UNIT/ML ~~LOC~~ SOLN
0.0000 [IU] | Freq: Three times a day (TID) | SUBCUTANEOUS | Status: DC
  Administered 2014-09-08 (×3): 1 [IU] via SUBCUTANEOUS
  Administered 2014-09-09 (×2): 2 [IU] via SUBCUTANEOUS
  Administered 2014-09-10 – 2014-09-17 (×8): 1 [IU] via SUBCUTANEOUS

## 2014-09-07 MED ORDER — INSULIN ASPART 100 UNIT/ML ~~LOC~~ SOLN: 0.0000 [IU] | Freq: Three times a day (TID) | SUBCUTANEOUS | Status: DC

## 2014-09-07 MED ORDER — POTASSIUM CHLORIDE 10 MEQ/100ML IV SOLN
10.0000 meq | INTRAVENOUS | Status: AC
  Administered 2014-09-07 (×4): 10 meq via INTRAVENOUS
  Filled 2014-09-07 (×4): qty 100

## 2014-09-07 NOTE — Progress Notes (Signed)
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 He received Neulasta 08/27/2014. He reports no pain with the Neulasta. No neuropathy symptoms.  He was admitted yesterday with nausea and vomiting. A plain x-ray revealed dilated loops of small bowel concerning for a distal small bowel obstruction.  An NG tube was placed. He feels better today. He reports having 2 loose bowel movements during the night. No pain.  Objective: Vital signs in last 24 hours: Blood pressure 104/64, pulse 98, temperature 97.5 F (36.4 C), temperature source Oral, resp. rate 18, height 6\' 1"  (1.854 m), weight 170 lb 14.4 oz (77.52 kg), SpO2 93 %.  Intake/Output from previous day: 05/15 0701 - Oct 05, 2022 0700 In: 2083.3 [I.V.:2083.3] Out: 2375 [Urine:500; Emesis/NG output:1875]  Physical Exam:  HEENT: No thrush Lungs: Decreased breath sounds at the right lower chest, no respiratory distress Cardiac: Regular rate and rhythm Abdomen: Distended, bowel sounds are present, no mass Extremities: No leg edema   Portacath/PICC-without erythema  Lab Results:  Recent Labs  09/06/14 0932 10/05/2014 0425  WBC 27.1* 27.4*  HGB 10.0* 8.1*  HCT 31.2* 24.9*  PLT 258 200    BMET  Recent Labs  09/06/14 0932 October 05, 2014 0425  NA 133* 134*  K 3.3* 3.3*  CL 96* 101  CO2 24 21*  GLUCOSE 126* 107*  BUN 13 14  CREATININE 0.74 0.85  CALCIUM 8.8* 7.8*    Studies/Results: Dg Abd 2 Views  Oct 05, 2014   CLINICAL DATA:  69 year old male with a history of abdominal distention. History of adenocarcinoma, with peritoneal disease.  EXAM: ABDOMEN - 2 VIEW  COMPARISON:  09/06/2014, 08/24/2014  FINDINGS: Similar pattern of multiple distended small bowel loops with a paucity of colonic gas.  Upright image demonstrates multiple air-fluid levels within small bowel.  Interval placement of gastric tube, which appears to terminate in the left upper quadrant in the region of the stomach.  No evidence of  free air on this series.  No displaced fracture.  IMPRESSION: Persistence of dilated small bowel loops with air-fluid levels and paucity of colonic gas compatible with small bowel obstruction. Recommend serial x-ray follow-up of this abnormal gas pattern, or if there is concern for further evaluation, CT abdomen.  Interval placement of gastric tube, which appears to terminate the stomach.  Signed,  Dulcy Fanny. Earleen Newport, DO  [facility]  [facility]   Electronically Signed   By: Corrie Mckusick D.O.   On: 2014/10/05 08:25   Dg Abd 2 Views  09/06/2014   CLINICAL DATA:  Abdominal distention and pain.  EXAM: ABDOMEN - 2 VIEW  COMPARISON:  Aug 24, 2014.  FINDINGS: Severe small bowel dilatation with air-fluid levels is noted concerning for distal small bowel obstruction. There is no evidence of pneumoperitoneum. Phleboliths are noted in the pelvis.  IMPRESSION: Severely dilated small bowel loops with air-fluid levels are noted concerning for distal small bowel obstruction.   Electronically Signed   By: Marijo Conception, M.D.   On: 09/06/2014 10:07    Medications: I have reviewed the patient's current medications.  Assessment/Plan: 1. 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  CT 06/30/2014 consistent with a partial small bowel obstruction and abdominal carcinomatosis  Omentum biopsy 07/01/2014 confirmed adenocarcinoma, nonspecific staining pattern  PET scan 07/07/2014 with multiple areas of hypermetabolic bowel wall thickening consistent with serosal implants from carcinomatosis, thickening and hypermetabolic activity at the terminal ileum felt to potentially represent a primary neoplasm,  solitary right liver metastasis, hypermetabolic nodular lung lesions  Cycle 1 FOLFOX 07/15/2014  Cycle 2 FOLFOX 07/28/2014  Cycle 3 FOLFOX 08/11/2014  Cycle 4 FOLFOX 08/25/2014  2. Abdominal pain secondary to #1,  improved 3. Iron deficiency, Hemoccult positive stool 4. Partial small bowel obstruction-recurrent bowel obstruction symptoms requiring hospital admission 08/18/2014 5. Bronchiectasis 6. 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 Nausea following cycle 1 FOLFOX, Emend and Aloxi added with cycle 2 8. Neutropenia secondary to chemotherapy-Neulasta will be added with cycle 4 FOLFOX 9. 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 He has completed 4 cycles of FOLFOX. He is now admitted with a bowel obstruction.  We discussed the high likelihood the bowel obstruction is related to carcinomatosis. This may indicate a lack of clinical benefit from FOLFOX.  Recommendations: 1. CT abdomen to evaluate for a site of obstruction and assess the response to FOLFOX 2. Decision on surgery per Dr. Excell Seltzer 3. Cycle 5 FOLFOX scheduled for 09/08/2014 will be placed on hold   LOS: 1 day   [place], [place]  09/07/2014, 2:02 PM

## 2014-09-07 NOTE — Progress Notes (Signed)
[facility] PROGRESS NOTE  CLAUD GOWAN MOQ:[id number] DOB: Feb 27, 1946 DOA: 09/06/2014 PCP: Gerrit Heck, MD Interim summary: 69 year old gentleman with recently diagnosed metastatic adenocarcinoma with unknown primary, apparently has small bowel carsinomatosis, with omental biopsy confirming adenocarcinoma, s/p 4 cycles of FOLFOX, last treatment on 5/3, followed by neutropenia and neulasta treatment. He was currently admitted for abdominal distention and high grade SBO and surgery consulted.  Assessment/Plan: 1. 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 Appreciate oncology recommendations.  CT abdomen ordered.   2. Abdominal distention and high grade SBO: -recurrent, on NG tube today and surgery consulted, plan for OR as per surgery.    3. Iron deficiency anemia:    4. Leukocytosis: possibly from the neulasta, pt received.    Hypokalemia: replace as needed.   Hyponatremia.   Nutrition: On TNA as per pharmacy.   Hypertension: Controlled. Resume cozaar.   Code Status: full code.  Family Communication: none at bedside Disposition Plan: pending.    Consultants:  Surgery   oncology  Procedures:  none  Antibiotics:  none  HPI/Subjective: No new complaints. Reports discomfort.  Objective: Filed Vitals:   09/07/14 1412  BP: 101/62  Pulse: 73  Temp: 98.4 F (36.9 C)  Resp: 18    Intake/Output Summary (Last 24 hours) at 09/07/14 1432 Last data filed at 09/07/14 1300  Gross per 24 hour  Intake 2083.33 ml  Output   2575 ml  Net -491.67 ml   Filed Weights   09/06/14 1212  Weight: 77.52 kg (170 lb 14.4 oz)    Exam:   General:  Alert afebrile , not in any distress, NG Ttube in place   Cardiovascular: s1s2  Respiratory: diminished at bases, no wheezing or rhonchi  Abdomen: soft NT, distended bowel sounds hypoactive  Musculoskeletal: trace pedal edema.   Data  Reviewed: Basic Metabolic Panel:  Recent Labs Lab 09/06/14 0932 09/07/14 0425  NA 133* 134*  K 3.3* 3.3*  CL 96* 101  CO2 24 21*  GLUCOSE 126* 107*  BUN 13 14  CREATININE 0.74 0.85  CALCIUM 8.8* 7.8*   Liver Function Tests:  Recent Labs Lab 09/06/14 0932 09/07/14 0425  AST 34 25  ALT 19 16*  ALKPHOS 117 104  BILITOT 0.4 0.5  PROT 6.4* 5.3*  ALBUMIN 3.1* 2.5*    Recent Labs Lab 09/06/14 0932  LIPASE 32   No results for input(s): AMMONIA in the last 168 hours. CBC:  Recent Labs Lab 09/06/14 0932 09/07/14 0425  WBC 27.1* 27.4*  NEUTROABS 23.0*  --   HGB 10.0* 8.1*  HCT 31.2* 24.9*  MCV 86.4 86.2  PLT 258 200   Cardiac Enzymes: No results for input(s): CKTOTAL, CKMB, CKMBINDEX, TROPONINI in the last 168 hours. BNP (last 3 results) No results for input(s): BNP in the last 8760 hours.  ProBNP (last 3 results) No results for input(s): PROBNP in the last 8760 hours.  CBG: No results for input(s): GLUCAP in the last 168 hours.  No results found for this or any previous visit (from the past 240 hour(s)).   Studies: Dg Abd 2 Views  09/07/2014   CLINICAL DATA:  69 year old male with a history of abdominal distention. History of adenocarcinoma, with peritoneal disease.  EXAM: ABDOMEN - 2 VIEW  COMPARISON:  09/06/2014, 08/24/2014  FINDINGS: Similar pattern of multiple distended small bowel loops with a paucity of colonic gas.  Upright image demonstrates multiple air-fluid levels within small bowel.  Interval  placement of gastric tube, which appears to terminate in the left upper quadrant in the region of the stomach.  No evidence of free air on this series.  No displaced fracture.  IMPRESSION: Persistence of dilated small bowel loops with air-fluid levels and paucity of colonic gas compatible with small bowel obstruction. Recommend serial x-ray follow-up of this abnormal gas pattern, or if there is concern for further evaluation, CT abdomen.  Interval placement of  gastric tube, which appears to terminate the stomach.  Signed,  Dulcy Fanny. Earleen Newport, DO  [facility]  [facility]   Electronically Signed   By: Corrie Mckusick D.O.   On: 09/07/2014 08:25   Dg Abd 2 Views  09/06/2014   CLINICAL DATA:  Abdominal distention and pain.  EXAM: ABDOMEN - 2 VIEW  COMPARISON:  Aug 24, 2014.  FINDINGS: Severe small bowel dilatation with air-fluid levels is noted concerning for distal small bowel obstruction. There is no evidence of pneumoperitoneum. Phleboliths are noted in the pelvis.  IMPRESSION: Severely dilated small bowel loops with air-fluid levels are noted concerning for distal small bowel obstruction.   Electronically Signed   By: Marijo Conception, M.D.   On: 09/06/2014 10:07    Scheduled Meds: . atorvastatin  10 mg Oral q morning - 10a  . enoxaparin (LOVENOX) injection  40 mg Subcutaneous Q24H  . famotidine  20 mg Oral BID  . insulin aspart  0-9 Units Subcutaneous Q8H  . losartan  25 mg Oral Daily  . potassium chloride  10 mEq Intravenous Q1 Hr x 4  . primidone  250 mg Oral q morning - 10a  . propranolol ER  80 mg Oral Daily   Continuous Infusions: . 0.9 % NaCl with KCl 20 mEq / L    . Marland KitchenTPN (CLINIMIX-E) Adult     And  . fat emulsion      Active Problems:   Small bowel obstruction    Time spent: 35 minutes    [facility] Pager [phone]  If 7PM-7AM, please contact night-coverage at [email or web address], password [facility] 09/07/2014, 2:32 PM  LOS: 1 day

## 2014-09-07 NOTE — Progress Notes (Signed)
PARENTERAL NUTRITION CONSULT NOTE - INITIAL  Pharmacy Consult for TPN Indication: SBO/ abdominal carcinomatosis  No Known Allergies  Patient Measurements: Height: 6\' 1"  (185.4 cm) Weight: 170 lb 14.4 oz (77.52 kg) IBW/kg (Calculated) : 79.9  Vital Signs: Temp: 97.5 F (36.4 C) (05/16 0446) Temp Source: Oral (05/16 0446) BP: 104/64 mmHg (05/16 0446) Pulse Rate: 98 (05/16 0446) Intake/Output from previous day: 05/15 0701 - 05/16 0700 In: 2083.3 [I.V.:2083.3] Out: 2375 [Urine:500; Emesis/NG JHERDE:0814] Intake/Output from this shift:    Labs:  Recent Labs  09/06/14 0932 09/07/14 0425  WBC 27.1* 27.4*  HGB 10.0* 8.1*  HCT 31.2* 24.9*  PLT 258 200    Recent Labs  09/06/14 0932 09/07/14 0425  NA 133* 134*  K 3.3* 3.3*  CL 96* 101  CO2 24 21*  GLUCOSE 126* 107*  BUN 13 14  CREATININE 0.74 0.85  CALCIUM 8.8* 7.8*  PROT 6.4* 5.3*  ALBUMIN 3.1* 2.5*  AST 34 25  ALT 19 16*  ALKPHOS 117 104  BILITOT 0.4 0.5   Estimated Creatinine Clearance: 92.4 mL/min (by C-G formula based on Cr of 0.85).   No results for input(s): GLUCAP in the last 72 hours.  Medical History: Past Medical History  Diagnosis Date  . Tremor     takes Primidone 250 once daily and inderall 160 daily for this-has had it since he was 21  . Pneumonia 06/2012  . Macular degeneration   . Benign essential tremor   . Coronary artery disease   . Anemia 06/30/2014  . 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 Essential hypertension 08/18/2014   Insulin Requirements: none ordered at this time  Current Nutrition: NPO  IVF: NS with 20 mEq KCl at 125 ml/hr  Central access: use PAC TPN start date: 5/16  ASSESSMENT                                                                                                          HPI: 32 yoM with Gastric Ca, abd carcinomatosis, 3rd admit for SBO this year. Required TPN last admit 4/26-5/2. NG tube place, surgery consulted -  Considering palliative surgery.  Begin TPN today 5/16, use PAC for central access  Significant events:  5/16: K low, 4 runs KCl 10 mEq  Today:    Glucose - 107, no hx DM  Electrolytes - Na & K low, repleting K IVPB and IV fluid, CorrCa 9  Renal - wnl  LFTs - nwnl  TGs - pending  Prealbumin - pending  NUTRITIONAL GOALS  RD recs: Clinimix E 5/15 at a goal rate of 100 ml/hr + 20% fat emulsion at 10 ml/hr to provide: 120 g/day protein, 2180 Kcal/day.  PLAN                                                                                                                         At 1800 today:  Start Clinimix E 5/15 at 40 ml/hr.  20% fat emulsion at 30ml/hr.  KCl 10 mEq IVPB x4 today  Plan to advance as tolerated to the goal rate.  TPN to contain standard multivitamins and trace elements.  Reduce IVF to 85 ml/hr.  Add SSI: use Novolog sensitive correction scale q8hr  Full labs in am  TPN lab panels on Mondays & Thursdays.  F/u daily.  Thank you for the consult,   Minda Ditto PharmD Pager [phone] 09/07/2014, 11:42 AM

## 2014-09-07 NOTE — Progress Notes (Signed)
Initial Nutrition Assessment  DOCUMENTATION CODES:  Severe malnutrition in context of chronic illness  INTERVENTION:  TPN per pharmacy RD to continue to monitor  NUTRITION DIAGNOSIS:  Malnutrition related to chronic illness as evidenced by 6 percent weight loss, severe depletion of body fat, severe depletion of muscle mass.  GOAL:  Patient will meet greater than or equal to 90% of their needs  MONITOR:  Labs, Weight trends, Skin, I & O's, Other (Comment) (TPN tolerance)  REASON FOR ASSESSMENT:  Consult New TPN/TNA  ASSESSMENT: 69 yo male w/ relatively new dx of abdominal carcinomatosis that is being treated by Dr Learta Codding w/ FOLFOX therapy, next dose due Tuesday . He presents to the ED after having developed intolerance to PO w/ nausea and emesis in association w/ his baseline of abdominal distension and a few recent admissions for SBO.   Pt reports eating lunch and dinner on 5/14 with no issues. Pt is following a soft diet at home. Pt states N/V started on 5/15.  Pt currently NPO d/t bowel rest for SBO, TPN being initiated today at 1800.  Pt with 11 lb of weight loss since 4/19 (6% weight loss x 1 month, siginificant for time frame).  Plan per pharmacy:  Start Clinimix E 5/15 at 40 ml/hr.  20% fat emulsion at 23ml/hr.  Goal: Clinimix E 5/15 at a goal rate of 100 ml/hr + 20% fat emulsion at 10 ml/hr to provide: 120 g/day protein, 2180 Kcal/day.  Labs reviewed: Low Na, K Height:  Ht Readings from Last 1 Encounters:  09/06/14 6\' 1"  (1.854 m)    Weight:  Wt Readings from Last 1 Encounters:  09/06/14 170 lb 14.4 oz (77.52 kg)    Ideal Body Weight:  83.6 kg  Wt Readings from Last 10 Encounters:  09/06/14 170 lb 14.4 oz (77.52 kg)  08/25/14 179 lb 11.2 oz (81.511 kg)  08/18/14 173 lb (78.472 kg)  08/11/14 181 lb 12.8 oz (82.464 kg)  07/28/14 181 lb 12.8 oz (82.464 kg)  07/08/14 183 lb 11.2 oz (83.326 kg)  07/02/14 191 lb 2.2 oz (86.7 kg)  04/09/14 191 lb  (86.637 kg)  12/16/13 183 lb 3.2 oz (83.099 kg)  11/03/13 183 lb 6.4 oz (83.19 kg)    BMI:  Body mass index is 22.55 kg/(m^2).  Estimated Nutritional Needs:  Kcal:  2100-2300  Protein:  110-120g  Fluid:  2.1L/day     Skin:  Reviewed, no issues  Diet Order:  Diet NPO time specified TPN (CLINIMIX-E) Adult  EDUCATION NEEDS:  No education needs identified at this time   Intake/Output Summary (Last 24 hours) at 09/07/14 1320 Last data filed at 09/07/14 0639  Gross per 24 hour  Intake 2083.33 ml  Output   2375 ml  Net -291.67 ml    Last BM:  5/16  Wesley Bibles, MS, RD, LDN Pager: [phone] After Hours Pager: [phone]

## 2014-09-07 NOTE — Progress Notes (Signed)
Patient ID: Wesley Dillon, male   DOB: [birth year], 69 y.o.   MRN: [id number]    Subjective: NG placed yesterday for persistent vomiting.  Feels better post NG.  Denies pain.  Had a couple of loose BMs  Objective: Vital signs in last 24 hours: Temp:  [97.5 F (36.4 C)-101.5 F (38.6 C)] 97.5 F (36.4 C) October 03, 2022 0446) Pulse Rate:  [65-99] 98 2022/10/03 0446) Resp:  [17-18] 18 2022/10/03 0446) BP: (104-126)/(64-82) 104/64 mmHg 2022/10/03 0446) SpO2:  [93 %-100 %] 93 % 2022-10-03 0446) Weight:  [77.52 kg (170 lb 14.4 oz)] 77.52 kg (170 lb 14.4 oz) (05/15 1212) Last BM Date: 10-03-14  Intake/Output from previous day: 05/15 0701 - Oct 03, 2022 0700 In: 2083.3 [I.V.:2083.3] Out: 2375 [Urine:500; Emesis/NG output:1875] Intake/Output this shift:    General appearance: alert, cooperative, no distress and very thin GI: normal findings: soft, non-tender and still significantly distended  Lab Results:   Recent Labs  09/06/14 0932 10/03/14 0425  WBC 27.1* 27.4*  HGB 10.0* 8.1*  HCT 31.2* 24.9*  PLT 258 200   BMET  Recent Labs  09/06/14 0932 10-03-2014 0425  NA 133* 134*  K 3.3* 3.3*  CL 96* 101  CO2 24 21*  GLUCOSE 126* 107*  BUN 13 14  CREATININE 0.74 0.85  CALCIUM 8.8* 7.8*     Studies/Results: Dg Abd 2 Views  2014/10/03   CLINICAL DATA:  69 year old male with a history of abdominal distention. History of adenocarcinoma, with peritoneal disease.  EXAM: ABDOMEN - 2 VIEW  COMPARISON:  09/06/2014, 08/24/2014  FINDINGS: Similar pattern of multiple distended small bowel loops with a paucity of colonic gas.  Upright image demonstrates multiple air-fluid levels within small bowel.  Interval placement of gastric tube, which appears to terminate in the left upper quadrant in the region of the stomach.  No evidence of free air on this series.  No displaced fracture.  IMPRESSION: Persistence of dilated small bowel loops with air-fluid levels and paucity of colonic gas compatible with small bowel  obstruction. Recommend serial x-ray follow-up of this abnormal gas pattern, or if there is concern for further evaluation, CT abdomen.  Interval placement of gastric tube, which appears to terminate the stomach.  Signed,  Dulcy Fanny. Earleen Newport, DO  [facility]  [facility]   Electronically Signed   By: Corrie Mckusick D.O.   On: 10/03/14 08:25   Dg Abd 2 Views  09/06/2014   CLINICAL DATA:  Abdominal distention and pain.  EXAM: ABDOMEN - 2 VIEW  COMPARISON:  Aug 24, 2014.  FINDINGS: Severe small bowel dilatation with air-fluid levels is noted concerning for distal small bowel obstruction. There is no evidence of pneumoperitoneum. Phleboliths are noted in the pelvis.  IMPRESSION: Severely dilated small bowel loops with air-fluid levels are noted concerning for distal small bowel obstruction.   Electronically Signed   By: Marijo Conception, M.D.   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  Clinically some better with NG Start TNA.  CT tomorrow to eval obstruction and carcinomatosis Dur to frequent recurrence/persistence likely will need laparotomy.  He is in favor orf this.    LOS: 1 day    Sanad Fearnow T October 03, 2014

## 2014-09-08 ENCOUNTER — Inpatient Hospital Stay (HOSPITAL_COMMUNITY): Payer: 59

## 2014-09-08 ENCOUNTER — Other Ambulatory Visit: Payer: 59

## 2014-09-08 ENCOUNTER — Ambulatory Visit: Payer: 59

## 2014-09-08 ENCOUNTER — Encounter (HOSPITAL_COMMUNITY): Payer: Self-pay | Admitting: Radiology

## 2014-09-08 ENCOUNTER — Telehealth: Payer: Self-pay | Admitting: *Deleted

## 2014-09-08 ENCOUNTER — Ambulatory Visit: Payer: 59 | Admitting: Oncology

## 2014-09-08 LAB — DIFFERENTIAL
BASOS PCT: 0 % (ref 0–1)
Basophils Absolute: 0 10*3/uL (ref 0.0–0.1)
Eosinophils Absolute: 0 10*3/uL (ref 0.0–0.7)
Eosinophils Relative: 0 % (ref 0–5)
Lymphocytes Relative: 9 % — ABNORMAL LOW (ref 12–46)
Lymphs Abs: 2 10*3/uL (ref 0.7–4.0)
MONO ABS: 2.3 10*3/uL — AB (ref 0.1–1.0)
Monocytes Relative: 10 % (ref 3–12)
NEUTROS PCT: 81 % — AB (ref 43–77)
Neutro Abs: 18.3 10*3/uL — ABNORMAL HIGH (ref 1.7–7.7)

## 2014-09-08 LAB — CBC
HCT: 25.5 % — ABNORMAL LOW (ref 39.0–52.0)
Hemoglobin: 8.1 g/dL — ABNORMAL LOW (ref 13.0–17.0)
MCH: 27.9 pg (ref 26.0–34.0)
MCHC: 31.8 g/dL (ref 30.0–36.0)
MCV: 87.9 fL (ref 78.0–100.0)
Platelets: 185 K/uL (ref 150–400)
RBC: 2.9 MIL/uL — ABNORMAL LOW (ref 4.22–5.81)
RDW: 20.2 % — ABNORMAL HIGH (ref 11.5–15.5)
WBC: 22.6 K/uL — ABNORMAL HIGH (ref 4.0–10.5)

## 2014-09-08 LAB — GLUCOSE, CAPILLARY
GLUCOSE-CAPILLARY: 137 mg/dL — AB (ref 65–99)
GLUCOSE-CAPILLARY: 128 mg/dL — AB (ref 65–99)
Glucose-Capillary: 139 mg/dL — ABNORMAL HIGH (ref 65–99)

## 2014-09-08 LAB — COMPREHENSIVE METABOLIC PANEL
ALT: 14 U/L — AB (ref 17–63)
AST: 24 U/L (ref 15–41)
Albumin: 2.6 g/dL — ABNORMAL LOW (ref 3.5–5.0)
Alkaline Phosphatase: 102 U/L (ref 38–126)
Anion gap: 8 (ref 5–15)
BUN: 13 mg/dL (ref 6–20)
CALCIUM: 7.9 mg/dL — AB (ref 8.9–10.3)
CO2: 25 mmol/L (ref 22–32)
CREATININE: 0.78 mg/dL (ref 0.61–1.24)
Chloride: 101 mmol/L (ref 101–111)
GLUCOSE: 135 mg/dL — AB (ref 65–99)
Potassium: 3.4 mmol/L — ABNORMAL LOW (ref 3.5–5.1)
SODIUM: 134 mmol/L — AB (ref 135–145)
TOTAL PROTEIN: 5.4 g/dL — AB (ref 6.5–8.1)
Total Bilirubin: 0.5 mg/dL (ref 0.3–1.2)

## 2014-09-08 LAB — PHOSPHORUS: Phosphorus: 1.8 mg/dL — ABNORMAL LOW (ref 2.5–4.6)

## 2014-09-08 LAB — TRIGLYCERIDES: Triglycerides: 98 mg/dL

## 2014-09-08 LAB — MAGNESIUM: Magnesium: 1.8 mg/dL (ref 1.7–2.4)

## 2014-09-08 LAB — PREALBUMIN: Prealbumin: 7.1 mg/dL — ABNORMAL LOW (ref 18–38)

## 2014-09-08 MED ORDER — CHLORHEXIDINE GLUCONATE CLOTH 2 % EX PADS
6.0000 | MEDICATED_PAD | Freq: Every day | CUTANEOUS | Status: DC
Start: 2014-09-09 — End: 2014-09-09
  Administered 2014-09-09: 6 via TOPICAL

## 2014-09-08 MED ORDER — FAT EMULSION 20 % IV EMUL
240.0000 mL | INTRAVENOUS | Status: AC
  Administered 2014-09-08: 240 mL via INTRAVENOUS
  Filled 2014-09-08: qty 250

## 2014-09-08 MED ORDER — IOHEXOL 300 MG/ML  SOLN
100.0000 mL | Freq: Once | INTRAMUSCULAR | Status: AC | PRN
  Administered 2014-09-08: 100 mL via INTRAVENOUS

## 2014-09-08 MED ORDER — DEXTROSE 5 % IV SOLN
2.0000 g | INTRAVENOUS | Status: AC
  Administered 2014-09-09 (×2): 2 g via INTRAVENOUS

## 2014-09-08 MED ORDER — IOHEXOL 300 MG/ML  SOLN
25.0000 mL | INTRAMUSCULAR | Status: AC
  Administered 2014-09-08 (×2): 25 mL via ORAL

## 2014-09-08 MED ORDER — POTASSIUM PHOSPHATES 15 MMOLE/5ML IV SOLN
30.0000 mmol | Freq: Once | INTRAVENOUS | Status: AC
  Administered 2014-09-08: 30 mmol via INTRAVENOUS
  Filled 2014-09-08: qty 10

## 2014-09-08 MED ORDER — TRACE MINERALS CR-CU-MN-SE-ZN 10-1000-500-60 MCG/ML IV SOLN
INTRAVENOUS | Status: AC
  Administered 2014-09-08: 18:00:00 via INTRAVENOUS
  Filled 2014-09-08: qty 960

## 2014-09-08 NOTE — Progress Notes (Signed)
Patient ID: Wesley Dillon, male   DOB: [birth year], 69 y.o.   MRN: [id number]    Subjective: Still bloated.  Had new crampy abdominal pain last night.  Still passing some very watery diarrhea  Objective: Vital signs in last 24 hours: Temp:  [97.5 F (36.4 C)-98.4 F (36.9 C)] 97.5 F (36.4 C) (05/17 0610) Pulse Rate:  [67-73] 72 (05/17 0610) Resp:  [18] 18 (05/17 0610) BP: (101-116)/(62-72) 116/72 mmHg (05/17 0610) SpO2:  [95 %-97 %] 97 % (05/17 0610) Last BM Date: 09/26/14  Intake/Output from previous day: September 26, 2022 0701 - 05/17 0700 In: -  Out: 2130 [Urine:280; Emesis/NG output:1850] Intake/Output this shift:    PE: Abd: soft, minimally tender, but distended, hypoactive BS, NGT with bilious output Heart: regular LungS: CTAB  Lab Results:   Recent Labs  2014-09-26 0425 09/08/14 0445  WBC 27.4* 22.6*  HGB 8.1* 8.1*  HCT 24.9* 25.5*  PLT 200 185   BMET  Recent Labs  26-Sep-2014 0425 09/08/14 0445  NA 134* 134*  K 3.3* 3.4*  CL 101 101  CO2 21* 25  GLUCOSE 107* 135*  BUN 14 13  CREATININE 0.85 0.78  CALCIUM 7.8* 7.9*   PT/INR No results for input(s): LABPROT, INR in the last 72 hours. CMP     Component Value Date/Time   NA 134* 09/08/2014 0445   NA 132* 08/25/2014 0817   K 3.4* 09/08/2014 0445   K 3.8 08/25/2014 0817   CL 101 09/08/2014 0445   CO2 25 09/08/2014 0445   CO2 22 08/25/2014 0817   GLUCOSE 135* 09/08/2014 0445   GLUCOSE 125 08/25/2014 0817   BUN 13 09/08/2014 0445   BUN 11.5 08/25/2014 0817   CREATININE 0.78 09/08/2014 0445   CREATININE 0.6* 08/25/2014 0817   CALCIUM 7.9* 09/08/2014 0445   CALCIUM 8.0* 08/25/2014 0817   PROT 5.4* 09/08/2014 0445   PROT 5.5* 08/25/2014 0817   ALBUMIN 2.6* 09/08/2014 0445   ALBUMIN 2.5* 08/25/2014 0817   AST 24 09/08/2014 0445   AST 38* 08/25/2014 0817   ALT 14* 09/08/2014 0445   ALT 28 08/25/2014 0817   ALKPHOS 102 09/08/2014 0445   ALKPHOS 82 08/25/2014 0817   BILITOT 0.5 09/08/2014 0445   BILITOT  0.40 08/25/2014 0817   GFRNONAA >60 09/08/2014 0445   GFRAA >60 09/08/2014 0445   Lipase     Component Value Date/Time   LIPASE 32 09/06/2014 0932       Studies/Results: Dg Abd 2 Views  26-Sep-2014   CLINICAL DATA:  69 year old male with a history of abdominal distention. History of adenocarcinoma, with peritoneal disease.  EXAM: ABDOMEN - 2 VIEW  COMPARISON:  09/06/2014, 08/24/2014  FINDINGS: Similar pattern of multiple distended small bowel loops with a paucity of colonic gas.  Upright image demonstrates multiple air-fluid levels within small bowel.  Interval placement of gastric tube, which appears to terminate in the left upper quadrant in the region of the stomach.  No evidence of free air on this series.  No displaced fracture.  IMPRESSION: Persistence of dilated small bowel loops with air-fluid levels and paucity of colonic gas compatible with small bowel obstruction. Recommend serial x-ray follow-up of this abnormal gas pattern, or if there is concern for further evaluation, CT abdomen.  Interval placement of gastric tube, which appears to terminate the stomach.  Signed,  Dulcy Fanny. Earleen Newport, DO  [facility]  [facility]   Electronically Signed   By: Corrie Mckusick D.O.  On: 09/07/2014 08:25   Dg Abd 2 Views  09/06/2014   CLINICAL DATA:  Abdominal distention and pain.  EXAM: ABDOMEN - 2 VIEW  COMPARISON:  Aug 24, 2014.  FINDINGS: Severe small bowel dilatation with air-fluid levels is noted concerning for distal small bowel obstruction. There is no evidence of pneumoperitoneum. Phleboliths are noted in the pelvis.  IMPRESSION: Severely dilated small bowel loops with air-fluid levels are noted concerning for distal small bowel obstruction.   Electronically Signed   By: Marijo Conception, M.D.   On: 09/06/2014 10:07    Anti-infectives: Anti-infectives    None       Assessment/Plan  1. SBO, likely secondary to carcinomatosis from unknown primary  source -CT scan ordered for today to see how well he is responding to treatment and to also evaluate his obstruction.  The patient leans towards an operation because he is tired of feeling the way he does now. -will continue NGT for now and await scan for further recommendations  LOS: 2 days    Malashia Kamaka E 09/08/2014, 9:07 AM Pager: [phone]

## 2014-09-08 NOTE — Progress Notes (Signed)
PARENTERAL NUTRITION CONSULT NOTE - INITIAL  Pharmacy Consult for TPN Indication: SBO/ abdominal carcinomatosis  No Known Allergies  Patient Measurements: Height: 6\' 1"  (185.4 cm) Weight: 170 lb 14.4 oz (77.52 kg) IBW/kg (Calculated) : 79.9  Vital Signs: Temp: 97.5 F (36.4 C) (05/17 0610) Temp Source: Oral (05/17 0610) BP: 116/72 mmHg (05/17 0610) Pulse Rate: 72 (05/17 0610) Intake/Output from previous day: 05/16 0701 - 05/17 0700 In: -  Out: 2130 [Urine:280; Emesis/NG output:1850] Intake/Output from this shift:    Labs:  Recent Labs  09/06/14 0932 09/07/14 0425 09/08/14 0445  WBC 27.1* 27.4* 22.6*  HGB 10.0* 8.1* 8.1*  HCT 31.2* 24.9* 25.5*  PLT 258 200 185    Recent Labs  09/06/14 0932 09/07/14 0425 09/08/14 0445  NA 133* 134* 134*  K 3.3* 3.3* 3.4*  CL 96* 101 101  CO2 24 21* 25  GLUCOSE 126* 107* 135*  BUN 13 14 13   CREATININE 0.74 0.85 0.78  CALCIUM 8.8* 7.8* 7.9*  MG  --   --  1.8  PHOS  --   --  1.8*  PROT 6.4* 5.3* 5.4*  ALBUMIN 3.1* 2.5* 2.6*  AST 34 25 24  ALT 19 16* 14*  ALKPHOS 117 104 102  BILITOT 0.4 0.5 0.5  PREALBUMIN  --   --  7.1*  TRIG  --   --  98   Estimated Creatinine Clearance: 98.2 mL/min (by C-G formula based on Cr of 0.78).    Recent Labs  09/07/14 2343 09/08/14 0608 09/08/14 0802  GLUCAP 130* 137* 128*    Medical History: Past Medical History  Diagnosis Date  . Tremor     takes Primidone 250 once daily and inderall 160 daily for this-has had it since he was 21  . Pneumonia 06/2012  . Macular degeneration   . Benign essential tremor   . Coronary artery disease   . Anemia 06/30/2014  . 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 Required TPN last admit 4/26-5/2. NG tube place, surgery consulted -  Considering palliative surgery. Begin TPN today 5/16, use PAC for central access  Significant events:  5/16: K low, 4 runs KCl 10 mEq 5/17: K & Phos low, K Phos 46mMol bolus, plan abd CT  Today:   Glucose - serum glucose 135,   Electrolytes - Na slightly below range, K remains low despite boluses yesterday, Mag wnl, Phosphorus low, Corr Ca 9 (note cannot adjust lytes in TPN)  Renal - wnl  LFTs - nwnl  TGs - 98 (5/17)  Prealbumin - 7.1 (5/17)  NUTRITIONAL GOALS  RD recs: Clinimix E 5/15 at a goal rate of 100 ml/hr + 20% fat emulsion at 10 ml/hr to provide: 120 g/day protein, 2180 Kcal/day.  PLAN                                                                                                                         At 1800 today:  Continue Clinimix E 5/15 at 40 ml/hr, normalize lytes before advancing  20% fat emulsion at 62ml/hr.  Potassium phosphate 30 mMol bolus  Plan to advance as tolerated to the goal rate.  TPN to contain standard multivitamins and trace elements.  Continue Novolog sensitive correction scale q8hr  BMET, Mag & Phos levels in am  TPN lab panels on Mondays & Thursdays.  F/u daily.  Thank you for the consult,   Minda Ditto PharmD Pager [phone] 09/08/2014, 10:44 AM

## 2014-09-08 NOTE — Progress Notes (Signed)
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 He was currently admitted for abdominal distention and high grade SBO and surgery consulted.  Assessment/Plan: 1. 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 Appreciate oncology recommendations.  CT abdomen ordered shows overall improvement in the carcinomatosis, but high grade persistent obstruction.   2. Abdominal distention and high grade SBO: -recurrent, on NG tube with intermittent suction and surgery consulted, plan for OR in am  For laparotomy.   3. Iron deficiency anemia:  stable  4. Leukocytosis: possibly from the neulasta, pt received.    Hypokalemia: replace as needed.   Hyponatremia. Slightly improved.   Nutrition: On TNA as per pharmacy.   Hypertension: Controlled. Resume cozaar.   Code Status: full code.  Family Communication: family  at bedside Disposition Plan: pending.    Consultants:  Surgery   oncology  Procedures:  none  Antibiotics:  none  HPI/Subjective: No new complaints. Looking forward to surgery. Objective: Filed Vitals:   09/08/14 1452  BP: 124/82  Pulse: 70  Temp: 98.1 F (36.7 C)  Resp: 18    Intake/Output Summary (Last 24 hours) at 09/08/14 1829 Last data filed at 09/08/14 5573  Gross per 24 hour  Intake      0 ml  Output   1550 ml  Net  -1550 ml   Filed Weights   09/06/14 1212  Weight: 77.52 kg (170 lb 14.4 oz)    Exam:   General:  Alert afebrile , not in any distress, NG Ttube in place   Cardiovascular: s1s2  Respiratory:  diminished at bases, no wheezing or rhonchi  Abdomen: soft NT, distended bowel sounds hypoactive  Musculoskeletal: trace pedal edema.   Data Reviewed: Basic Metabolic Panel:  Recent Labs Lab 09/06/14 0932 09/07/14 0425 09/08/14 0445  NA 133* 134* 134*  K 3.3* 3.3* 3.4*  CL 96* 101 101  CO2 24 21* 25  GLUCOSE 126* 107* 135*  BUN 13 14 13   CREATININE 0.74 0.85 0.78  CALCIUM 8.8* 7.8* 7.9*  MG  --   --  1.8  PHOS  --   --  1.8*   Liver Function Tests:  Recent Labs Lab 09/06/14 0932 09/07/14 0425 09/08/14 0445  AST 34 25 24  ALT 19 16* 14*  ALKPHOS 117 104 102  BILITOT 0.4 0.5 0.5  PROT 6.4* 5.3* 5.4*  ALBUMIN 3.1* 2.5* 2.6*    Recent Labs Lab 09/06/14 0932  LIPASE 32   No results for input(s): AMMONIA in the last 168 hours. CBC:  Recent Labs Lab 09/06/14 0932 09/07/14 0425 09/08/14 0445  WBC 27.1* 27.4* 22.6*  NEUTROABS 23.0*  --  18.3*  HGB 10.0* 8.1* 8.1*  HCT 31.2* 24.9* 25.5*  MCV 86.4 86.2 87.9  PLT 258 200 185   Cardiac Enzymes: No results for input(s): CKTOTAL, CKMB, CKMBINDEX, TROPONINI in the last 168 hours. BNP (last 3 results) No results for input(s): BNP in the last 8760 hours.  ProBNP (last 3 results) No results for input(s): PROBNP in the last 8760 hours.  CBG:  Recent Labs Lab 09/07/14 1706 09/07/14 2343 09/08/14 0608 09/08/14 0802 09/08/14 1701  GLUCAP 102* 130* 137* 128* 139*  No results found for this or any previous visit (from the past 240 hour(s)).   Studies: Ct Abdomen Pelvis W Contrast  09/08/2014   ADDENDUM REPORT: 09/08/2014 13:17  ADDENDUM: The transition point is in the anterior aspect of the left mid abdomen where the small bowel wall thickening begins, best seen on sagittal image number 95 and coronal image number 25. Another differential consideration is tumor involving this portion of the small bowel, including the possibility of a primary small bowel tumor at the location of initial concentric  narrowing.  These results were called by telephone at the time of interpretation on 09/08/2014 at 1:17 pm to Dr. Excell Seltzer , who verbally acknowledged these results.   Electronically Signed   By: Claudie Revering M.D.   On: 09/08/2014 13:17   09/08/2014   CLINICAL DATA:  Pattern on small bowel obstruction on radiographs earlier today. Abdominal distention. History of peritoneal adenocarcinomatosis with unknown primary. Undergoing chemotherapy.  EXAM: CT ABDOMEN AND PELVIS WITH CONTRAST  TECHNIQUE: Multidetector CT imaging of the abdomen and pelvis was performed using the standard protocol following bolus administration of intravenous contrast.  CONTRAST:  125mL OMNIPAQUE IOHEXOL 300 MG/ML  SOLN  COMPARISON:  Radiographs obtained yesterday. Abdomen and pelvis CT dated 06/30/2014.  FINDINGS: Interval small bilateral pleural effusions. There is also bilateral lower lobe, lingular and right middle lobe atelectasis.  Multiple dilated, contrast filled proximal small bowel loops as well as less dilated, fluid-filled mid small bowel loops. Normal caliber distal small bowel and colon. The duodenum is not dilated. There is swirling of the mesenteric vessels in the central abdomen, best seen on the sagittal reconstructed images (sagittal image number 69). There are also some small bowel loops in the left mid abdomen which are minimally dilated and demonstrate diffuse, low-density wall thickening, not previously present. No areas of vascular occlusion are seen.  Moderate amount of free peritoneal fluid with a significant increase. There is some loculated fluid lateral to the rectosigmoid conjunction on the left, not previously present. There is a suggestion of a small amount of vague soft tissue density associated with this fluid collection, best seen on coronal image number 84. The fluid collection measures 5.1 x 3.2 cm on image number 92. The previously demonstrated peritoneal nodularity is not currently visualized. No  enlarged lymph nodes.  And upper pole left renal cyst is unchanged. The liver, spleen, pancreas, adrenal glands, right kidney and urinary bladder unremarkable. Mildly enlarged prostate gland. Small bilateral inguinal hernias containing fat. Nasogastric tube tip in the proximal stomach. Mildly dilated gallbladder containing several tiny gallstones that appear adherent to the gallbladder wall in the fundus. No gallbladder wall thickening. Lumbar and lower thoracic spine degenerative changes. No evidence of bony metastatic disease.  IMPRESSION: 1. Partial small bowel obstruction, most likely due to an internal hernia. 2. Moderate diffuse low density wall thickening involving small bowel loops in the left mid abdomen. This could be due to ischemia or lymphatic congestion associated with the small bowel obstruction. This could also be infectious or inflammatory in nature. 3. Moderate amount of ascites, increased. 4. Interval 5.1 x 3.2 cm loculated fluid collection lateral to the rectosigmoid junction on the left with subtle soft tissue components, suspicious for a peritoneal implant of metastatic disease. 5. Otherwise, improved peritoneal metastatic disease. 6. Interval small bilateral pleural effusions and bibasilar atelectasis. 7. Small bilateral inguinal hernias containing fat and previously noted small hiatal hernia. 8. Mildly enlarged prostate gland. These results will be called to the  ordering clinician or representative by the Radiologist Assistant, and communication documented in the PACS or zVision Dashboard.  Electronically Signed: By: Claudie Revering M.D. On: 09/08/2014 13:03   Dg Abd 2 Views  09/07/2014   CLINICAL DATA:  69 year old male with a history of abdominal distention. History of adenocarcinoma, with peritoneal disease.  EXAM: ABDOMEN - 2 VIEW  COMPARISON:  09/06/2014, 08/24/2014  FINDINGS: Similar pattern of multiple distended small bowel loops with a paucity of colonic gas.  Upright image  demonstrates multiple air-fluid levels within small bowel.  Interval placement of gastric tube, which appears to terminate in the left upper quadrant in the region of the stomach.  No evidence of free air on this series.  No displaced fracture.  IMPRESSION: Persistence of dilated small bowel loops with air-fluid levels and paucity of colonic gas compatible with small bowel obstruction. Recommend serial x-ray follow-up of this abnormal gas pattern, or if there is concern for further evaluation, CT abdomen.  Interval placement of gastric tube, which appears to terminate the stomach.  Signed,  Dulcy Fanny. Earleen Newport, DO  [facility]  [facility]   Electronically Signed   By: Corrie Mckusick D.O.   On: 09/07/2014 08:25    Scheduled Meds: . atorvastatin  10 mg Oral q morning - 10a  . [START ON 09/09/2014] cefOXitin  2 g Intravenous On Call to OR  . enoxaparin (LOVENOX) injection  40 mg Subcutaneous Q24H  . famotidine  20 mg Oral BID  . insulin aspart  0-9 Units Subcutaneous Q8H  . losartan  25 mg Oral Daily  . primidone  250 mg Oral q morning - 10a  . propranolol ER  80 mg Oral Daily   Continuous Infusions: . 0.9 % NaCl with KCl 20 mEq / L 85 mL/hr at 09/07/14 1906  . Marland KitchenTPN (CLINIMIX-E) Adult 40 mL/hr at 09/08/14 1816   And  . fat emulsion 240 mL (09/08/14 1816)    Active Problems:   Small bowel obstruction    Time spent: 35 minutes    Shalea Tomczak  [facility] Pager [phone]  If 7PM-7AM, please contact night-coverage at [email or web address], password [facility] 09/08/2014, 6:29 PM  LOS: 2 days

## 2014-09-08 NOTE — Telephone Encounter (Signed)
Completed [employer] forms signed by Dr. [name] taken to Raquel in managed care to be faxed. Wife requests to be called when complete. She will pick up originals from [employer] at front desk.

## 2014-09-09 ENCOUNTER — Encounter (HOSPITAL_COMMUNITY): Payer: Self-pay | Admitting: Anesthesiology

## 2014-09-09 ENCOUNTER — Encounter (HOSPITAL_COMMUNITY)
Admission: EM | Disposition: A | Discharge status: Home / Self Care | Payer: Self-pay | Source: Home / Self Care | Attending: Internal Medicine

## 2014-09-09 ENCOUNTER — Inpatient Hospital Stay (HOSPITAL_COMMUNITY): Payer: 59 | Admitting: Anesthesiology

## 2014-09-09 DIAGNOSIS — D649 Anemia, unspecified: Secondary | ICD-10-CM

## 2014-09-09 HISTORY — PX: COLOSTOMY REVISION: SHX5232

## 2014-09-09 HISTORY — PX: LAPAROTOMY: SHX154

## 2014-09-09 HISTORY — PX: BOWEL RESECTION: SHX1257

## 2014-09-09 LAB — BASIC METABOLIC PANEL
Anion gap: 8 (ref 5–15)
BUN: 8 mg/dL (ref 6–20)
CHLORIDE: 103 mmol/L (ref 101–111)
CO2: 25 mmol/L (ref 22–32)
Calcium: 7.9 mg/dL — ABNORMAL LOW (ref 8.9–10.3)
Creatinine, Ser: 0.58 mg/dL — ABNORMAL LOW (ref 0.61–1.24)
GFR calc non Af Amer: >60 mL/min (ref >60)
Glucose, Bld: 128 mg/dL — ABNORMAL HIGH (ref 65–99)
Potassium: 3.5 mmol/L (ref 3.5–5.1)
Sodium: 136 mmol/L (ref 135–145)

## 2014-09-09 LAB — GLUCOSE, CAPILLARY
Glucose-Capillary: 111 mg/dL — ABNORMAL HIGH (ref 65–99)
Glucose-Capillary: 125 mg/dL — ABNORMAL HIGH (ref 65–99)
Glucose-Capillary: 160 mg/dL — ABNORMAL HIGH (ref 65–99)
Glucose-Capillary: 161 mg/dL — ABNORMAL HIGH (ref 65–99)
Glucose-Capillary: 173 mg/dL — ABNORMAL HIGH (ref 65–99)

## 2014-09-09 LAB — PHOSPHORUS: Phosphorus: 2.6 mg/dL (ref 2.5–4.6)

## 2014-09-09 LAB — SURGICAL PCR SCREEN
MRSA, PCR: NEGATIVE
Staphylococcus aureus: NEGATIVE

## 2014-09-09 LAB — MAGNESIUM: Magnesium: 1.8 mg/dL (ref 1.7–2.4)

## 2014-09-09 LAB — ABO/RH: ABO/RH(D): B POS

## 2014-09-09 LAB — PREPARE RBC (CROSSMATCH)

## 2014-09-09 SURGERY — LAPAROTOMY, EXPLORATORY
Anesthesia: General

## 2014-09-09 MED ORDER — SODIUM CHLORIDE 0.9 % IV SOLN
Freq: Once | INTRAVENOUS | Status: AC
  Administered 2014-09-09: 13:00:00 via INTRAVENOUS

## 2014-09-09 MED ORDER — HYDROMORPHONE HCL 1 MG/ML IJ SOLN
INTRAMUSCULAR | Status: AC
  Filled 2014-09-09: qty 1

## 2014-09-09 MED ORDER — ALBUMIN HUMAN 5 % IV SOLN
INTRAVENOUS | Status: DC | PRN
  Administered 2014-09-09: 12:00:00 via INTRAVENOUS

## 2014-09-09 MED ORDER — MIDAZOLAM HCL 2 MG/2ML IJ SOLN
INTRAMUSCULAR | Status: AC
  Filled 2014-09-09: qty 2

## 2014-09-09 MED ORDER — ENOXAPARIN SODIUM 40 MG/0.4ML ~~LOC~~ SOLN
40.0000 mg | SUBCUTANEOUS | Status: DC
  Administered 2014-09-10 – 2014-09-17 (×8): 40 mg via SUBCUTANEOUS
  Filled 2014-09-09 (×9): qty 0.4

## 2014-09-09 MED ORDER — PROPOFOL 10 MG/ML IV BOLUS
INTRAVENOUS | Status: AC
  Filled 2014-09-09: qty 20

## 2014-09-09 MED ORDER — ROCURONIUM BROMIDE 100 MG/10ML IV SOLN
INTRAVENOUS | Status: DC | PRN
  Administered 2014-09-09 (×2): 10 mg via INTRAVENOUS
  Administered 2014-09-09: 25 mg via INTRAVENOUS
  Administered 2014-09-09 (×4): 10 mg via INTRAVENOUS
  Administered 2014-09-09: 15 mg via INTRAVENOUS

## 2014-09-09 MED ORDER — HYDROMORPHONE HCL 1 MG/ML IJ SOLN
0.5000 mg | INTRAMUSCULAR | Status: DC | PRN
  Administered 2014-09-09 – 2014-09-13 (×15): 1 mg via INTRAVENOUS
  Filled 2014-09-09 (×15): qty 1

## 2014-09-09 MED ORDER — HYDROMORPHONE HCL 1 MG/ML IJ SOLN
0.2500 mg | INTRAMUSCULAR | Status: DC | PRN
  Administered 2014-09-09 (×2): 0.25 mg via INTRAVENOUS
  Administered 2014-09-09: 0.5 mg via INTRAVENOUS

## 2014-09-09 MED ORDER — PROMETHAZINE HCL 25 MG/ML IJ SOLN
INTRAMUSCULAR | Status: AC
  Filled 2014-09-09: qty 1

## 2014-09-09 MED ORDER — FAT EMULSION 20 % IV EMUL
240.0000 mL | INTRAVENOUS | Status: AC
  Administered 2014-09-09: 240 mL via INTRAVENOUS
  Filled 2014-09-09: qty 250

## 2014-09-09 MED ORDER — LIDOCAINE HCL (CARDIAC) 20 MG/ML IV SOLN
INTRAVENOUS | Status: DC | PRN
  Administered 2014-09-09: 50 mg via INTRAVENOUS

## 2014-09-09 MED ORDER — NEOSTIGMINE METHYLSULFATE 10 MG/10ML IV SOLN
INTRAVENOUS | Status: AC
  Filled 2014-09-09: qty 1

## 2014-09-09 MED ORDER — ONDANSETRON HCL 4 MG/2ML IJ SOLN
INTRAMUSCULAR | Status: DC | PRN
  Administered 2014-09-09 (×2): 4 mg via INTRAVENOUS

## 2014-09-09 MED ORDER — FENTANYL CITRATE (PF) 250 MCG/5ML IJ SOLN
INTRAMUSCULAR | Status: AC
  Filled 2014-09-09: qty 5

## 2014-09-09 MED ORDER — PROPOFOL 10 MG/ML IV BOLUS
INTRAVENOUS | Status: DC | PRN
Start: 2014-09-09 — End: 2014-09-09
  Administered 2014-09-09: 120 mg via INTRAVENOUS

## 2014-09-09 MED ORDER — DEXTROSE 5 % IV SOLN
INTRAVENOUS | Status: AC
  Filled 2014-09-09: qty 2

## 2014-09-09 MED ORDER — GLYCOPYRROLATE 0.2 MG/ML IJ SOLN
INTRAMUSCULAR | Status: AC
  Filled 2014-09-09: qty 2

## 2014-09-09 MED ORDER — NEOSTIGMINE METHYLSULFATE 10 MG/10ML IV SOLN
INTRAVENOUS | Status: DC | PRN
  Administered 2014-09-09: 4 mg via INTRAVENOUS

## 2014-09-09 MED ORDER — 0.9 % SODIUM CHLORIDE (POUR BTL) OPTIME
TOPICAL | Status: DC | PRN
  Administered 2014-09-09: 2500 mL

## 2014-09-09 MED ORDER — SUCCINYLCHOLINE CHLORIDE 20 MG/ML IJ SOLN
INTRAMUSCULAR | Status: DC | PRN
  Administered 2014-09-09: 100 mg via INTRAVENOUS

## 2014-09-09 MED ORDER — ONDANSETRON HCL 4 MG/2ML IJ SOLN
INTRAMUSCULAR | Status: AC
  Filled 2014-09-09: qty 2

## 2014-09-09 MED ORDER — TRACE MINERALS CR-CU-MN-SE-ZN 10-1000-500-60 MCG/ML IV SOLN
INTRAVENOUS | Status: AC
  Administered 2014-09-09: 17:00:00 via INTRAVENOUS
  Filled 2014-09-09: qty 1440

## 2014-09-09 MED ORDER — FENTANYL CITRATE (PF) 100 MCG/2ML IJ SOLN
INTRAMUSCULAR | Status: DC | PRN
  Administered 2014-09-09: 100 ug via INTRAVENOUS
  Administered 2014-09-09: 50 ug via INTRAVENOUS
  Administered 2014-09-09 (×2): 25 ug via INTRAVENOUS
  Administered 2014-09-09: 50 ug via INTRAVENOUS

## 2014-09-09 MED ORDER — DEXAMETHASONE SODIUM PHOSPHATE 10 MG/ML IJ SOLN
INTRAMUSCULAR | Status: AC
  Filled 2014-09-09: qty 1

## 2014-09-09 MED ORDER — PHENYLEPHRINE HCL 10 MG/ML IJ SOLN
10.0000 mg | INTRAVENOUS | Status: DC | PRN
  Administered 2014-09-09: 40 ug/min via INTRAVENOUS

## 2014-09-09 MED ORDER — ROCURONIUM BROMIDE 100 MG/10ML IV SOLN
INTRAVENOUS | Status: AC
  Filled 2014-09-09: qty 1

## 2014-09-09 MED ORDER — HYDROMORPHONE HCL 1 MG/ML IJ SOLN
INTRAMUSCULAR | Status: DC | PRN
  Administered 2014-09-09: 0.5 mg via INTRAVENOUS

## 2014-09-09 MED ORDER — MEPERIDINE HCL 50 MG/ML IJ SOLN: 6.2500 mg | INTRAMUSCULAR | Status: DC | PRN

## 2014-09-09 MED ORDER — GLYCOPYRROLATE 0.2 MG/ML IJ SOLN
INTRAMUSCULAR | Status: DC | PRN
  Administered 2014-09-09: 0.6 mg via INTRAVENOUS

## 2014-09-09 MED ORDER — HYDROMORPHONE HCL 2 MG/ML IJ SOLN
INTRAMUSCULAR | Status: AC
  Filled 2014-09-09: qty 1

## 2014-09-09 MED ORDER — ALBUMIN HUMAN 5 % IV SOLN
INTRAVENOUS | Status: AC
  Filled 2014-09-09: qty 250

## 2014-09-09 MED ORDER — LACTATED RINGERS IV SOLN
INTRAVENOUS | Status: DC | PRN
  Administered 2014-09-09 (×2): via INTRAVENOUS
  Administered 2014-09-09: 1000 mL

## 2014-09-09 MED ORDER — DEXAMETHASONE SODIUM PHOSPHATE 10 MG/ML IJ SOLN
INTRAMUSCULAR | Status: DC | PRN
  Administered 2014-09-09: 10 mg via INTRAVENOUS

## 2014-09-09 MED ORDER — FENTANYL CITRATE (PF) 100 MCG/2ML IJ SOLN: 25.0000 ug | INTRAMUSCULAR | Status: DC | PRN

## 2014-09-09 MED ORDER — LACTATED RINGERS IV SOLN: INTRAVENOUS | Status: DC

## 2014-09-09 MED ORDER — FAMOTIDINE IN NACL 20-0.9 MG/50ML-% IV SOLN
20.0000 mg | Freq: Two times a day (BID) | INTRAVENOUS | Status: DC
  Administered 2014-09-09 – 2014-09-15 (×12): 20 mg via INTRAVENOUS
  Filled 2014-09-09 (×13): qty 50

## 2014-09-09 MED ORDER — MIDAZOLAM HCL 5 MG/5ML IJ SOLN
INTRAMUSCULAR | Status: DC | PRN
  Administered 2014-09-09: 1 mg via INTRAVENOUS

## 2014-09-09 MED ORDER — PROMETHAZINE HCL 25 MG/ML IJ SOLN
6.2500 mg | INTRAMUSCULAR | Status: DC | PRN
  Administered 2014-09-09: 6.25 mg via INTRAVENOUS

## 2014-09-09 SURGICAL SUPPLY — 56 items
APPLICATOR COTTON TIP 6IN STRL (MISCELLANEOUS) IMPLANT
BLADE EXTENDED COATED 6.5IN (ELECTRODE) IMPLANT
BLADE HEX COATED 2.75 (ELECTRODE) ×4 IMPLANT
BLADE SURG SZ10 CARB STEEL (BLADE) ×4 IMPLANT
CLIP TI LARGE 6 (CLIP) IMPLANT
COVER MAYO STAND STRL (DRAPES) ×4 IMPLANT
DRAPE LAPAROSCOPIC ABDOMINAL (DRAPES) ×4 IMPLANT
DRAPE SHEET LG 3/4 BI-LAMINATE (DRAPES) ×4 IMPLANT
DRAPE UTILITY XL STRL (DRAPES) ×4 IMPLANT
DRAPE WARM FLUID 44X44 (DRAPE) ×4 IMPLANT
DRSG OPSITE POSTOP 4X10 (GAUZE/BANDAGES/DRESSINGS) ×4 IMPLANT
ELECT REM PT RETURN 9FT ADLT (ELECTROSURGICAL) ×4
ELECTRODE REM PT RTRN 9FT ADLT (ELECTROSURGICAL) ×2 IMPLANT
GAUZE SPONGE 4X4 12PLY STRL (GAUZE/BANDAGES/DRESSINGS) ×4 IMPLANT
GLOVE BIOGEL PI IND STRL 7.0 (GLOVE) ×2 IMPLANT
GLOVE BIOGEL PI IND STRL 7.5 (GLOVE) ×2 IMPLANT
GLOVE BIOGEL PI INDICATOR 7.0 (GLOVE) ×2
GLOVE BIOGEL PI INDICATOR 7.5 (GLOVE) ×2
GLOVE ECLIPSE 7.5 STRL STRAW (GLOVE) ×8 IMPLANT
GOWN STRL REUS W/TWL LRG LVL3 (GOWN DISPOSABLE) ×4 IMPLANT
GOWN STRL REUS W/TWL XL LVL3 (GOWN DISPOSABLE) ×12 IMPLANT
KIT BASIN OR (CUSTOM PROCEDURE TRAY) ×4 IMPLANT
LEGGING LITHOTOMY PAIR STRL (DRAPES) IMPLANT
LIGASURE IMPACT 36 18CM CVD LR (INSTRUMENTS) ×4 IMPLANT
NS IRRIG 1000ML POUR BTL (IV SOLUTION) ×8 IMPLANT
PACK GENERAL/GYN (CUSTOM PROCEDURE TRAY) ×4 IMPLANT
PENCIL BUTTON HOLSTER BLD 10FT (ELECTRODE) ×4 IMPLANT
RELOAD PROXIMATE 75MM BLUE (ENDOMECHANICALS) ×16 IMPLANT
SHEARS HARMONIC ACE PLUS 36CM (ENDOMECHANICALS) IMPLANT
SPONGE LAP 18X18 X RAY DECT (DISPOSABLE) IMPLANT
STAPLER GUN LINEAR PROX 60 (STAPLE) ×4 IMPLANT
STAPLER PROXIMATE 75MM BLUE (STAPLE) ×4 IMPLANT
STAPLER VISISTAT 35W (STAPLE) ×4 IMPLANT
SUCTION POOLE TIP (SUCTIONS) ×4 IMPLANT
SUT NOV 1 T60/GS (SUTURE) IMPLANT
SUT NOVA 1 T20/GS 25DT (SUTURE) IMPLANT
SUT NOVA NAB DX-16 0-1 5-0 T12 (SUTURE) IMPLANT
SUT NOVA T20/GS 25 (SUTURE) IMPLANT
SUT PDS AB 1 CTX 36 (SUTURE) IMPLANT
SUT PDS AB 1 TP1 96 (SUTURE) IMPLANT
SUT PDS AB 3-0 SH 27 (SUTURE) ×16 IMPLANT
SUT SILK 2 0 (SUTURE) ×2
SUT SILK 2 0 SH CR/8 (SUTURE) ×16 IMPLANT
SUT SILK 2 0SH CR/8 30 (SUTURE) IMPLANT
SUT SILK 2-0 18XBRD TIE 12 (SUTURE) ×2 IMPLANT
SUT SILK 2-0 30XBRD TIE 12 (SUTURE) IMPLANT
SUT SILK 3 0 (SUTURE) ×2
SUT SILK 3 0 SH CR/8 (SUTURE) ×4 IMPLANT
SUT SILK 3-0 18XBRD TIE 12 (SUTURE) ×2 IMPLANT
SUT VIC AB 3-0 54XBRD REEL (SUTURE) IMPLANT
SUT VIC AB 3-0 BRD 54 (SUTURE)
SYR BULB IRRIGATION 50ML (SYRINGE) ×4 IMPLANT
TOWEL OR 17X26 10 PK STRL BLUE (TOWEL DISPOSABLE) ×8 IMPLANT
TRAY FOLEY W/METER SILVER 14FR (SET/KITS/TRAYS/PACK) ×4 IMPLANT
YANKAUER SUCT BULB TIP 10FT TU (MISCELLANEOUS) ×4 IMPLANT
YANKAUER SUCT BULB TIP NO VENT (SUCTIONS) ×4 IMPLANT

## 2014-09-09 NOTE — Progress Notes (Signed)
PARENTERAL NUTRITION CONSULT NOTE - Follow up  Pharmacy Consult for TPN Indication: SBO/ abdominal carcinomatosis  No Known Allergies  Patient Measurements: Height: 6\' 1"  (185.4 cm) Weight: 170 lb 14.4 oz (77.52 kg) IBW/kg (Calculated) : 79.9  Vital Signs: Temp: 98.6 F (37 C) (05/18 0511) Temp Source: Oral (05/18 0511) BP: 122/65 mmHg (05/18 0511) Pulse Rate: 83 (05/18 0511) Intake/Output from previous day: 05/17 0701 - 05/18 0700 In: 4664.7 [P.O.:60; I.V.:2844.6; NG/GT:15; TPN:1745.2] Out: 800 [Urine:100; Emesis/NG output:700] Intake/Output from this shift:    Labs:  Recent Labs  09/07/14 0425 09/08/14 0445  WBC 27.4* 22.6*  HGB 8.1* 8.1*  HCT 24.9* 25.5*  PLT 200 185    Recent Labs  09/07/14 0425 09/08/14 0445 09/09/14 0400  NA 134* 134* 136  K 3.3* 3.4* 3.5  CL 101 101 103  CO2 21* 25 25  GLUCOSE 107* 135* 128*  BUN 14 13 8   CREATININE 0.85 0.78 0.58*  CALCIUM 7.8* 7.9* 7.9*  MG  --  1.8 1.8  PHOS  --  1.8* 2.6  PROT 5.3* 5.4*  --   ALBUMIN 2.5* 2.6*  --   AST 25 24  --   ALT 16* 14*  --   ALKPHOS 104 102  --   BILITOT 0.5 0.5  --   PREALBUMIN  --  7.1*  --   TRIG  --  98  --    Estimated Creatinine Clearance: 98.2 mL/min (by C-G formula based on Cr of 0.58).    Recent Labs  09/08/14 1701 09/08/14 2309 09/09/14 0733  GLUCAP 139* 125* 111*    Medical History: Past Medical History  Diagnosis Date  . Tremor     takes Primidone 250 once daily and inderall 160 daily for this-has had it since he was 21  . Pneumonia 06/2012  . Macular degeneration   . Benign essential tremor   . Coronary artery disease   . Anemia 06/30/2014  . 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 Essential hypertension 08/18/2014   Insulin Requirements: 3 units  Current Nutrition: NPO  IVF: NS with 20 mEq KCl at 85 ml/hr  Central access: use PAC TPN start date: 5/16  ASSESSMENT                                                                                                           HPI: 68 yoM with Gastric Ca, abd carcinomatosis, 3rd admit for SBO this year. Required TPN last admit 4/26-5/2. NG tube place, surgery consulted - Considering palliative surgery. Begin TPN today 5/16, use PAC for central access  Significant events:  5/16: K low, 4 runs KCl 10 mEq 5/17: K & Phos low, K Phos 78mMol bolus, plan abd CT 5/18: To OR for lap resection of obstructing mass.  Today:   Glucose - at goal <150 w/ minimal SSI.    Electrolytes - Na, K, phos all improved to wnl. CorrCa wnl at 9.02.  Renal - wnl  LFTs - wnl  TGs - 98 (5/17)  Prealbumin - 7.1 (5/17)  NUTRITIONAL GOALS  RD recs: 2100-2300Kcal, 110-120g, 2.1L per day. Clinimix E 5/15 at a goal rate of 100 ml/hr + 20% fat emulsion at 10 ml/hr to provide: 120 g/day protein, 2180 Kcal/day.  PLAN                                                                                                                         At 1800 today:  Increase Clinimix E 5/15 to 60 ml/hr.    Cont 20% fat emulsion at 70ml/hr.  Plan to advance as tolerated to the goal rate. Advancing cautiously.  TPN to contain standard multivitamins and trace elements.  Cont Sensitive SSI q8h.  TPN lab panels on Mondays & Thursdays.  F/u daily.  Romeo Rabon, PharmD, pager [phone]. 09/09/2014,10:02 AM.

## 2014-09-09 NOTE — Progress Notes (Signed)
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 surgery consulted - Initially treated with bowel rest, NG tube and IV fluids but had persistent high-grade obstruction - Status post exploratory laparotomy, resection of terminal ileum and right colon and small bowel bypass on 5/18 - Management per surgery - Continue TNA  Iron deficiency anemia - Stable - Follow CBC in a.m. and transfuse if hemoglobin less than 7 g per DL  Leukocytosis - Possibly related to Neulasta - Follow CBCs  Hypokalemia - replace as needed    Code Status: Full Family Communication: Discussed with spouse at bedside Disposition Plan: DC home when medically stable   Consultants:  CC[facility]  Medical Oncology  Procedures:  NGT  Exploratory laparotomy, resection of terminal ileum and right colon and small bowel bypass on 5/18  Antibiotics:  Cefoxitin 5/18   Subjective: Patient was seen this morning prior to surgery. He denied complaints. He specifically denied abdominal pain and indicated that abdominal distention had improved. Having loose BMs.  Objective: Filed Vitals:   09/09/14 1500 09/09/14 1533 09/09/14 1554 09/09/14 1646  BP: 99/60 113/68 104/67 130/80  Pulse: 73 79 80 86  Temp: 97.5 F  (36.4 C) 97.6 F (36.4 C) 97.8 F (36.6 C) 97.8 F (36.6 C)  TempSrc:  Oral Axillary Axillary  Resp: 18 18 20 20   Height:      Weight:      SpO2: 100% 100% 100% 100%    Intake/Output Summary (Last 24 hours) at 09/09/14 1730 Last data filed at 09/09/14 1500  Gross per 24 hour  Intake 7534.74 ml  Output   1415 ml  Net 6119.74 ml   Filed Weights   09/06/14 1212  Weight: 77.52 kg (170 lb 14.4 oz)     Exam:  General exam: Moderately built and thinly nourished pleasant middle-aged male lying comfortably in bed Respiratory system: Clear. No increased work of breathing. Cardiovascular system: S1 & S2 heard, RRR. No JVD, murmurs, gallops, clicks or pedal edema. Gastrointestinal system: Abdomen is mildly distended, soft and nontender. Normal bowel sounds heard. NG tube + Central nervous system: Alert and oriented. No focal neurological deficits. Extremities: Symmetric 5 x 5 power.   Data Reviewed: Basic Metabolic Panel:  Recent Labs Lab 09/06/14 0932 09/07/14 0425 09/08/14 0445 09/09/14 0400  NA 133* 134* 134* 136  K 3.3* 3.3* 3.4* 3.5  CL 96* 101 101 103  CO2 24 21* 25 25  GLUCOSE 126* 107* 135* 128*  BUN 13 14 13 8   CREATININE 0.74 0.85 0.78 0.58*  CALCIUM 8.8* 7.8* 7.9* 7.9*  MG  --   --  1.8 1.8  PHOS  --   --  1.8* 2.6   Liver Function Tests:  Recent Labs Lab 09/06/14 0932 09/07/14 0425 09/08/14 0445  AST 34 25 24  ALT 19 16* 14*  ALKPHOS 117 104 102  BILITOT 0.4 0.5 0.5  PROT 6.4* 5.3*  5.4*  ALBUMIN 3.1* 2.5* 2.6*    Recent Labs Lab 09/06/14 0932  LIPASE 32   No results for input(s): AMMONIA in the last 168 hours. CBC:  Recent Labs Lab 09/06/14 0932 09/07/14 0425 09/08/14 0445  WBC 27.1* 27.4* 22.6*  NEUTROABS 23.0*  --  18.3*  HGB 10.0* 8.1* 8.1*  HCT 31.2* 24.9* 25.5*  MCV 86.4 86.2 87.9  PLT 258 200 185   Cardiac Enzymes: No results for input(s): CKTOTAL, CKMB, CKMBINDEX, TROPONINI in the last 168 hours. BNP (last 3  results) No results for input(s): PROBNP in the last 8760 hours. CBG:  Recent Labs Lab 09/08/14 0802 09/08/14 1701 09/08/14 2309 09/09/14 0733 09/09/14 1435  GLUCAP 128* 139* 125* 111* 160*    Recent Results (from the past 240 hour(s))  Surgical pcr screen     Status: None   Collection Time: 09/08/14 10:02 PM  Result Value Ref Range Status   MRSA, PCR NEGATIVE NEGATIVE Final   Staphylococcus aureus NEGATIVE NEGATIVE Final    Comment:        The Xpert SA Assay (FDA approved for NASAL specimens in patients over 36 years of age), is one component of a comprehensive surveillance program.  Test performance has been validated by [facility] for patients greater than or equal to 68 year old. It is not intended to diagnose infection nor to guide or monitor treatment.          Studies: Ct Abdomen Pelvis W Contrast  09/08/2014   ADDENDUM REPORT: 09/08/2014 13:17  ADDENDUM: The transition point is in the anterior aspect of the left mid abdomen where the small bowel wall thickening begins, best seen on sagittal image number 95 and coronal image number 25. Another differential consideration is tumor involving this portion of the small bowel, including the possibility of a primary small bowel tumor at the location of initial concentric narrowing.  These results were called by telephone at the time of interpretation on 09/08/2014 at 1:17 pm to Dr. Excell Seltzer , who verbally acknowledged these results.   Electronically Signed   By: Claudie Revering M.D.   On: 09/08/2014 13:17   09/08/2014   CLINICAL DATA:  Pattern on small bowel obstruction on radiographs earlier today. Abdominal distention. History of peritoneal adenocarcinomatosis with unknown primary. Undergoing chemotherapy.  EXAM: CT ABDOMEN AND PELVIS WITH CONTRAST  TECHNIQUE: Multidetector CT imaging of the abdomen and pelvis was performed using the standard protocol following bolus administration of intravenous contrast.  CONTRAST:   125mL OMNIPAQUE IOHEXOL 300 MG/ML  SOLN  COMPARISON:  Radiographs obtained yesterday. Abdomen and pelvis CT dated 06/30/2014.  FINDINGS: Interval small bilateral pleural effusions. There is also bilateral lower lobe, lingular and right middle lobe atelectasis.  Multiple dilated, contrast filled proximal small bowel loops as well as less dilated, fluid-filled mid small bowel loops. Normal caliber distal small bowel and colon. The duodenum is not dilated. There is swirling of the mesenteric vessels in the central abdomen, best seen on the sagittal reconstructed images (sagittal image number 69). There are also some small bowel loops in the left mid abdomen which are minimally dilated and demonstrate diffuse, low-density wall thickening, not previously present. No areas of vascular occlusion are seen.  Moderate amount of free peritoneal fluid with a significant increase. There is some loculated fluid lateral to the rectosigmoid conjunction on the left, not previously present. There is a suggestion of a small amount of vague soft tissue density associated with this fluid collection, best seen  on coronal image number 84. The fluid collection measures 5.1 x 3.2 cm on image number 92. The previously demonstrated peritoneal nodularity is not currently visualized. No enlarged lymph nodes.  And upper pole left renal cyst is unchanged. The liver, spleen, pancreas, adrenal glands, right kidney and urinary bladder unremarkable. Mildly enlarged prostate gland. Small bilateral inguinal hernias containing fat. Nasogastric tube tip in the proximal stomach. Mildly dilated gallbladder containing several tiny gallstones that appear adherent to the gallbladder wall in the fundus. No gallbladder wall thickening. Lumbar and lower thoracic spine degenerative changes. No evidence of bony metastatic disease.  IMPRESSION: 1. Partial small bowel obstruction, most likely due to an internal hernia. 2. Moderate diffuse low density wall  thickening involving small bowel loops in the left mid abdomen. This could be due to ischemia or lymphatic congestion associated with the small bowel obstruction. This could also be infectious or inflammatory in nature. 3. Moderate amount of ascites, increased. 4. Interval 5.1 x 3.2 cm loculated fluid collection lateral to the rectosigmoid junction on the left with subtle soft tissue components, suspicious for a peritoneal implant of metastatic disease. 5. Otherwise, improved peritoneal metastatic disease. 6. Interval small bilateral pleural effusions and bibasilar atelectasis. 7. Small bilateral inguinal hernias containing fat and previously noted small hiatal hernia. 8. Mildly enlarged prostate gland. These results will be called to the ordering clinician or representative by the Radiologist Assistant, and communication documented in the PACS or zVision Dashboard.  Electronically Signed: By: Claudie Revering M.D. On: 09/08/2014 13:03        Scheduled Meds: . [START ON 09/10/2014] enoxaparin (LOVENOX) injection  40 mg Subcutaneous Q24H  . famotidine  20 mg Oral BID  . HYDROmorphone      . insulin aspart  0-9 Units Subcutaneous Q8H  . promethazine       Continuous Infusions: . Marland KitchenTPN (CLINIMIX-E) Adult 60 mL/hr at 09/09/14 1703   And  . fat emulsion 240 mL (09/09/14 1703)  . 0.9 % NaCl with KCl 20 mEq / L 85 mL/hr at 09/09/14 1656  . Marland KitchenTPN (CLINIMIX-E) Adult Stopped (09/09/14 1704)   And  . fat emulsion Stopped (09/09/14 1704)    Active Problems:   Small bowel obstruction    Time spent: 20 minutes.    Vernell Leep, MD, FACP, FHM. [facility] Pager [phone]  If 7PM-7AM, please contact night-coverage [email or web address] Password [email or web address] 09/09/2014, 5:30 PM    LOS: 3 days

## 2014-09-09 NOTE — Transfer of Care (Signed)
Immediate Anesthesia Transfer of Care Note  Patient: Wesley Dillon  Procedure(s) Performed: Procedure(s): EXPLORATORY LAPAROTOMY resection terminal ileum and right colon (N/A) SMALL BOWEL bypass (N/A) COLON RESECTION RIGHT  Patient Location: PACU  Anesthesia Type:General  Level of Consciousness:  sedated, patient cooperative and responds to stimulation  Airway & Oxygen Therapy:Patient Spontanous Breathing and Patient connected to face mask oxgen  Post-op Assessment:  Report given to PACU RN and Post -op Vital signs reviewed and stable  Post vital signs:  Reviewed and stable  Last Vitals:  Filed Vitals:   09/09/14 0511  BP: 122/65  Pulse: 83  Temp: 37 C  Resp: 18    Complications: No apparent anesthesia complications

## 2014-09-09 NOTE — Progress Notes (Signed)
Nutrition Follow-up  DOCUMENTATION CODES:  Severe malnutrition in context of chronic illness  INTERVENTION:  TPN per pharmacy RD will continue to monitor  NUTRITION DIAGNOSIS:  Malnutrition related to chronic illness as evidenced by percent weight loss, severe depletion of body fat, severe depletion of muscle mass.  ongoing  GOAL:  Patient will meet greater than or equal to 90% of their needs  Not met  MONITOR:  Labs, Weight trends, Skin, I & O's, Other (Comment) (TPN tolerance)  REASON FOR ASSESSMENT:  Consult New TPN/TNA  ASSESSMENT:  69 yo male w/ relatively new dx of abdominal carcinomatosis that is being treated by Dr Learta Codding w/ FOLFOX therapy, next dose due Tuesday . He presents to the ED after having developed intolerance to PO w/ nausea and emesis in association w/ his baseline of abdominal distension and a few recent admissions for SBO.   Pt reports eating lunch and dinner on 5/14 with no issues. Pt is following a soft diet at home. Pt states N/V started on 5/15.  Pt currently NPO d/t bowel rest for SBO, TPN initiated 5/17.  Pt with 11 lb of weight loss since 4/19 (6% weight loss x 1 month, siginificant for time frame).  Pt in OR for exploratory laparotomy during RD visit.  Per RN, pt tolerating TPN.   Plan per pharmacy: At 1800 today:  Increase Clinimix E 5/15 to 60 ml/hr.  Cont 20% fat emulsion at 20m/hr.  Plan to advance as tolerated to the goal rate. Advancing cautiously  Goal: Clinimix E 5/15 at a goal rate of 100 ml/hr + 20% fat emulsion at 10 ml/hr to provide: 120 g/day protein, 2180 Kcal/day.  Labs and medications reviewed  CBGs 111-139  Na, K, Mg, Phos WNL  Height:  Ht Readings from Last 1 Encounters:  09/06/14 6' 1"  (1.854 m)    Weight:  Wt Readings from Last 1 Encounters:  09/06/14 170 lb 14.4 oz (77.52 kg)    Ideal Body Weight:  83.6 kg  Wt Readings from Last 10 Encounters:  09/06/14 170 lb 14.4 oz (77.52 kg)   08/25/14 179 lb 11.2 oz (81.511 kg)  08/18/14 173 lb (78.472 kg)  08/11/14 181 lb 12.8 oz (82.464 kg)  07/28/14 181 lb 12.8 oz (82.464 kg)  07/08/14 183 lb 11.2 oz (83.326 kg)  07/02/14 191 lb 2.2 oz (86.7 kg)  04/09/14 191 lb (86.637 kg)  12/16/13 183 lb 3.2 oz (83.099 kg)  11/03/13 183 lb 6.4 oz (83.19 kg)    BMI:  Body mass index is 22.55 kg/(m^2).  Estimated Nutritional Needs:  Kcal:  2100-2300  Protein:  110-120g  Fluid:  2.1L/day  Skin:  Reviewed, no issues  Diet Order:  Diet NPO time specified TPN (CLINIMIX-E) Adult .TPN (CLINIMIX-E) Adult  EDUCATION NEEDS:  No education needs identified at this time   Intake/Output Summary (Last 24 hours) at 09/09/14 1329 Last data filed at 09/09/14 1315  Gross per 24 hour  Intake 7234.74 ml  Output   1215 ml  Net 6019.74 ml    Last BM:  5/18  Wesley SchimkeMBlack [place] RHackberry LByron

## 2014-09-09 NOTE — Op Note (Signed)
Preoperative Diagnosis: malignant small bowel obstruction  Postoprative Diagnosis: Same  Procedure: Procedure(s): EXPLORATORY LAPAROTOMY resection terminal ileum and right colon SMALL BOWEL bypass    Surgeon: Excell Seltzer T   Assistants: Autumn Messing  Anesthesia:  General endotracheal anesthesia  Indications: patient is a 69 year old male with a diagnosis of number of months ago of abdominal carcinomatosis with partial small bowel obstruction. Image guided biopsy shows adenocarcinoma of possible small bowel origin. He has not had surgical resection but has undergone chemotherapy. He has had a couple of episodes of small bowel obstruction that resolved with nonoperative management but now is readmitted shortly after a previous episode of obstruction and these intervals were getting shorter with increasing symptoms. CT scan has shown generally overall reduction in carcinomatosis but what appears to be a mass or apple core lesion of the small bowel with high grade obstruction. After extensive discussion regarding alternatives and surgical risks detailed elsewhere we have elected to proceed with laparotomy and hopefully resection to relieve his ongoing obstruction.    Procedure Detail:  Patient was brought to the operating room, placed in the supine position on the operating table, and general endotracheal anesthesia induced. He received preoperative broad-spectrum IV antibiotics. NG tube was in place. Foley catheter was placed. The abdomen was widely sterilely prepped and draped. Patient timeout was performed and correct procedure verified. A midline incision skirting the umbilicus was used and dissection carried down through the subcutaneous tissue and midline fascia and the peritoneum entered under direct vision. A large amount of straw colored ascites, almost 5 L, was suctioned from the abdominal cavity. A thorough exploration was then performed. There was actually no major studding of the  peritoneum or bulky disease throughout the peritoneal cavity. The stomach and diaphragmatic surfaces and liver were normal. There was tumor deposit over the gallbladder and a fairly significant mass involving the distal right colon and pericolonic fat. The small bowel was dilated. Beginning at the ligament of Treitz there was minimal dilatation. There were frequent small, subtle 1 cm tumor deposits all along the mesenteric border of the small bowel every 5-10 cm but very small and nonobstructing proximally. As I traced distally there were a few slightly larger deposits but again nonobstructing with markedly dilated small bowel that appeared chronic. There was a several centimeter deposit involving the mesenteric border of the ileum about 2 feet from the ileocecal valve with obvious very high grade obstruction at this point and decompressed terminal ileum beyond this. There was a little bit of studding over the cecum and right colon but again a fairly large tumor mass pulling the right colon in toward the retroperitoneum just proximal to the hepatic flexure. There was another deposit in the left pelvis adjacent to the sigmoid colon but not at all obstructive. Initially I thought I might be able to resect just the short segment of small bowel to relieve his obstruction but I felt that there was significant tumor involving the right colon that if not currently obstructing would be quickly. I therefore elected to resect en bloc the terminal ileum including the larger mass causing obstruction and the right colon up to the mid transverse colon which appeared normal. Initially the right colon was mobilized dividing lateral peritoneal attachments. This mobilization was somewhat difficult as there was tumor in the retroperitoneum and fixing the right colon to the lateral peritoneal sidewall. This involved a fairly deep dissection into Geroda's fascia and the retroperitoneum in order to mobilize the tumor and the right colon.  There was some mild to moderate bleeding. With fairly tedious dissection the right colon was able to be fully mobilized up out of the retroperitoneum along with the cecum and the terminal ileum. The ureter was identified and carefully protected during the dissection and appeared uninjured. The hepatocolic ligament was taken down with the Harmonic scalpel and the lesser omentum mobilized out toward the proximal transverse colon preparation for resection. After full mobilization the ileum was divided proximal to the obstructing mass and the transverse colon was divided with firings of the GIA 75 mm stapler. Mesentery of the terminal ileum and right colon was then sequentially divided with the LigaSure device and the specimen removed. There was some occasional bleeding from the thickened edematous mesentery which was controlled with figure-of-eight silk sutures. Following this a functional end-to-end anastomosis was created between the terminal ileum and the transverse colon with a single firing of the GIA 75 mm stapler. The common enterotomy was closed in 2 layers with running 3-0 PDS seromuscular 2-0 silk sutures. When the small bowel was opened for the anastomosis the distended small bowel was decompressed with a pool sucker. At this point while milking the contents distally for decompression we did come across another very significant high-grade obstruction to 3 feet proximal to the anastomosis which was apparent only when we began milking the dilated contents distally. It was not completely obstructing but had very significant narrowing and likely impending obstruction. For this reason we then went ahead and did a bypass at this area with an entero-enterotomy using the GIA 75 mm stapler and closing the common enterotomy with running 3-0 PDS seromuscular 2-0 silk sutures. The entire bowel was then again run from ligament of Treitz and ran the colon and although there were numerous very small areas of tumor studding  along the mesenteric border of the small bowel there was nothing that was at all obstructing and most of these were just a few millimeters in size. The largest tumor remaining was several centimeters along the left pelvic side wall and adjacent to the sigmoid colon but nonobstructive. The abdomen was thoroughly irrigated. Hemostasis was obtained. The viscera returned to their anatomic position. All instruments and drapes gloves and gowns were changed. The midline wound was closed with looped running #1 PDS beginning or end of the incision and tied centrally. Subcutaneous tissue was irrigated and skin closed with staples.    Findings: As above  Estimated Blood Loss:  200 mL         Drains: none  Blood Given: 2 units  PRBC          Specimens: terminal ileum and right colon        Complications:  * No complications entered in OR log *         Disposition: PACU - hemodynamically stable.         Condition: stable

## 2014-09-09 NOTE — Anesthesia Postprocedure Evaluation (Signed)
  Anesthesia Post-op Note  Patient: Wesley Dillon  Procedure(s) Performed: Procedure(s) (LRB): EXPLORATORY LAPAROTOMY resection terminal ileum and right colon (N/A) SMALL BOWEL bypass (N/A) COLON RESECTION RIGHT  Patient Location: PACU  Anesthesia Type: General  Level of Consciousness: awake and alert   Airway and Oxygen Therapy: Patient Spontanous Breathing  Post-op Pain: mild  Post-op Assessment: Post-op Vital signs reviewed, Patient's Cardiovascular Status Stable, Respiratory Function Stable, Patent Airway and No signs of Nausea or vomiting  Last Vitals:  Filed Vitals:   09/09/14 1646  BP: 130/80  Pulse: 86  Temp: 36.6 C  Resp: 20    Post-op Vital Signs: stable   Complications: No apparent anesthesia complications

## 2014-09-09 NOTE — Anesthesia Preprocedure Evaluation (Signed)
Anesthesia Evaluation  Patient identified by MRN, date of birth, ID band Patient awake    Reviewed: Allergy & Precautions, NPO status , Patient's Chart, lab work & pertinent test results  Airway Mallampati: II  TM Distance: >3 FB Neck ROM: Full    Dental no notable dental hx.    Pulmonary neg pulmonary ROS,  breath sounds clear to auscultation  Pulmonary exam normal       Cardiovascular hypertension, Pt. on medications + CAD negative cardio ROS Normal cardiovascular examRhythm:Regular Rate:Normal     Neuro/Psych negative neurological ROS  negative psych ROS   GI/Hepatic negative GI ROS, Neg liver ROS,   Endo/Other  negative endocrine ROS  Renal/GU negative Renal ROS  negative genitourinary   Musculoskeletal negative musculoskeletal ROS (+)   Abdominal   Peds negative pediatric ROS (+)  Hematology negative hematology ROS (+) anemia ,   Anesthesia Other Findings   Reproductive/Obstetrics negative OB ROS                             Anesthesia Physical Anesthesia Plan  ASA: II  Anesthesia Plan: General   Post-op Pain Management:    Induction: Intravenous, Rapid sequence and Cricoid pressure planned  Airway Management Planned: Oral ETT  Additional Equipment:   Intra-op Plan:   Post-operative Plan: Extubation in OR  Informed Consent: I have reviewed the patients History and Physical, chart, labs and discussed the procedure including the risks, benefits and alternatives for the proposed anesthesia with the patient or authorized representative who has indicated his/her understanding and acceptance.   Dental advisory given  Plan Discussed with: CRNA  Anesthesia Plan Comments:         Anesthesia Quick Evaluation

## 2014-09-09 NOTE — Anesthesia Procedure Notes (Signed)
Procedure Name: Intubation Date/Time: 09/09/2014 10:58 AM Performed by: Tesia Lybrand, Virgel Gess Pre-anesthesia Checklist: Patient identified, Emergency Drugs available, Suction available, Patient being monitored and Timeout performed Patient Re-evaluated:Patient Re-evaluated prior to inductionOxygen Delivery Method: Circle system utilized Preoxygenation: Pre-oxygenation with 100% oxygen Intubation Type: IV induction Ventilation: Mask ventilation without difficulty Laryngoscope Size: Mac and 4 Grade View: Grade II Tube type: Oral Tube size: 7.5 mm Number of attempts: 1 Airway Equipment and Method: Stylet Placement Confirmation: ETT inserted through vocal cords under direct vision,  positive ETCO2,  CO2 detector and breath sounds checked- equal and bilateral Secured at: 22 cm Tube secured with: Tape Dental Injury: Teeth and Oropharynx as per pre-operative assessment

## 2014-09-10 ENCOUNTER — Encounter (HOSPITAL_COMMUNITY): Payer: Self-pay | Admitting: General Surgery

## 2014-09-10 ENCOUNTER — Ambulatory Visit: Payer: 59

## 2014-09-10 LAB — COMPREHENSIVE METABOLIC PANEL
ALBUMIN: 2.1 g/dL — AB (ref 3.5–5.0)
ALK PHOS: 85 U/L (ref 38–126)
ALT: 12 U/L — ABNORMAL LOW (ref 17–63)
AST: 20 U/L (ref 15–41)
Anion gap: 7 (ref 5–15)
BUN: 11 mg/dL (ref 6–20)
CO2: 24 mmol/L (ref 22–32)
Calcium: 7.8 mg/dL — ABNORMAL LOW (ref 8.9–10.3)
Chloride: 105 mmol/L (ref 101–111)
Creatinine, Ser: 0.72 mg/dL (ref 0.61–1.24)
GFR calc non Af Amer: >60 mL/min (ref >60)
Glucose, Bld: 162 mg/dL — ABNORMAL HIGH (ref 65–99)
POTASSIUM: 4.6 mmol/L (ref 3.5–5.1)
SODIUM: 136 mmol/L (ref 135–145)
TOTAL PROTEIN: 4.8 g/dL — AB (ref 6.5–8.1)
Total Bilirubin: 0.4 mg/dL (ref 0.3–1.2)

## 2014-09-10 LAB — POCT I-STAT EG7
Acid-base deficit: 1 mmol/L (ref 0.0–2.0)
BICARBONATE: 24.1 meq/L — AB (ref 20.0–24.0)
Calcium, Ion: 1.08 mmol/L — ABNORMAL LOW (ref 1.13–1.30)
HEMATOCRIT: 31 % — AB (ref 39.0–52.0)
Hemoglobin: 10.5 g/dL — ABNORMAL LOW (ref 13.0–17.0)
O2 Saturation: 100 %
PH VEN: 7.355 — AB (ref 7.250–7.300)
PO2 VEN: 313 mmHg — AB (ref 30.0–45.0)
Potassium: 4 mmol/L (ref 3.5–5.1)
Sodium: 132 mmol/L — ABNORMAL LOW (ref 135–145)
TCO2: 25 mmol/L (ref 0–100)
pCO2, Ven: 43.2 mmHg — ABNORMAL LOW (ref 45.0–50.0)

## 2014-09-10 LAB — PHOSPHORUS: PHOSPHORUS: 2.7 mg/dL (ref 2.5–4.6)

## 2014-09-10 LAB — TYPE AND SCREEN
ABO/RH(D): B POS
Antibody Screen: NEGATIVE
UNIT DIVISION: 0
Unit division: 0

## 2014-09-10 LAB — GLUCOSE, CAPILLARY
Glucose-Capillary: 134 mg/dL — ABNORMAL HIGH (ref 65–99)
Glucose-Capillary: 113 mg/dL — ABNORMAL HIGH (ref 65–99)

## 2014-09-10 LAB — MAGNESIUM: Magnesium: 1.7 mg/dL (ref 1.7–2.4)

## 2014-09-10 MED ORDER — TRACE MINERALS CR-CU-MN-SE-ZN 10-1000-500-60 MCG/ML IV SOLN: INTRAVENOUS | Status: DC

## 2014-09-10 MED ORDER — MAGNESIUM SULFATE 2 GM/50ML IV SOLN
2.0000 g | Freq: Once | INTRAVENOUS | Status: AC
  Administered 2014-09-10: 2 g via INTRAVENOUS
  Filled 2014-09-10: qty 50

## 2014-09-10 MED ORDER — TRACE MINERALS CR-CU-MN-SE-ZN 10-1000-500-60 MCG/ML IV SOLN
INTRAVENOUS | Status: AC
  Administered 2014-09-10: 17:00:00 via INTRAVENOUS
  Filled 2014-09-10: qty 1920

## 2014-09-10 MED ORDER — FAT EMULSION 20 % IV EMUL
240.0000 mL | INTRAVENOUS | Status: AC
  Administered 2014-09-10: 240 mL via INTRAVENOUS
  Filled 2014-09-10: qty 250

## 2014-09-10 MED ORDER — POTASSIUM CHLORIDE IN NACL 20-0.9 MEQ/L-% IV SOLN
INTRAVENOUS | Status: DC
  Administered 2014-09-10: 1000 mL via INTRAVENOUS
  Filled 2014-09-10: qty 1000

## 2014-09-10 MED ORDER — FAT EMULSION 20 % IV EMUL: 240.0000 mL | INTRAVENOUS | Status: DC

## 2014-09-10 NOTE — Consult Note (Signed)
   [facility] CM Inpatient Consult   09/10/2014  Wesley Dillon 14-Sep-1945 [id number]  Came to visit patient at bedside on behalf of [place] to Wellness program for [place] employees/dependents with [facility] insurance. Patient denies having any needs for the program at this time. Discussed that the [employer] office has supplements at a low cost for [facility] employees with [facility] insurance if he would need it in the future. States he does not expect to need any supplements. Appreciative of visit and reports he will be interested in a post hospital follow up call. Confirmed best contact number for him as [phone]. Left Link to Wellness brochure and contact number at bedside. Will make inpatient RNCM aware of visit.  Marthenia Rolling, MSN-Ed, RN,BSN [facility] Liaison [phone]

## 2014-09-10 NOTE — Progress Notes (Signed)
Nutrition Follow-up  DOCUMENTATION CODES:  Severe malnutrition in context of chronic illness  INTERVENTION:  TPN per pharmacy RD to continue to monitor  NUTRITION DIAGNOSIS:  Malnutrition related to chronic illness as evidenced by percent weight loss, severe depletion of body fat, severe depletion of muscle mass.  Ongoing.  GOAL:  Patient will meet greater than or equal to 90% of their needs  Unmet.  MONITOR:  Labs, Weight trends, Skin, I & O's, Other (Comment) (TPN tolerance)  REASON FOR ASSESSMENT:  Consult New TPN/TNA  ASSESSMENT: 69 yo male w/ relatively new dx of abdominal carcinomatosis that is being treated by Dr Learta Codding w/ FOLFOX therapy, next dose due Tuesday . He presents to the ED after having developed intolerance to PO w/ nausea and emesis in association w/ his baseline of abdominal distension and a few recent admissions for SBO.  S/p 5/18 Procedure(s): EXPLORATORY LAPAROTOMY resection terminal ileum and right colon, SMALL BOWEL bypass  5/16: Pt reports eating lunch and dinner on 5/14 with no issues. Pt is following a soft diet at home. Pt states N/V started on 5/15.  Pt currently NPO d/t bowel rest for SBO, TPN initiated 5/17.  Pt with 11 lb of weight loss since 4/19 (6% weight loss x 1 month, siginificant for time frame).  5/19: -Pt walking around the unit, tolerating TPN with no issues.  Plan per Pharmacy: At 1800 today:  Increase Clinimix E 5/15 to 80 ml/hr.  Cont 20% fat emulsion at 58ml/hr  Goal:Clinimix E 5/15 at a goal rate of 100 ml/hr + 20% fat emulsion at 10 ml/hr to provide: 120 g/day protein, 2180 Kcal/day.  Labs reviewed: CBGs: 134-173 Height:  Ht Readings from Last 1 Encounters:  09/06/14 6\' 1"  (1.854 m)    Weight:  Wt Readings from Last 1 Encounters:  09/06/14 170 lb 14.4 oz (77.52 kg)    Ideal Body Weight:  83.6 kg  Wt Readings from Last 10 Encounters:  09/06/14 170 lb 14.4 oz (77.52 kg)  08/25/14 179 lb 11.2  oz (81.511 kg)  08/18/14 173 lb (78.472 kg)  08/11/14 181 lb 12.8 oz (82.464 kg)  07/28/14 181 lb 12.8 oz (82.464 kg)  07/08/14 183 lb 11.2 oz (83.326 kg)  07/02/14 191 lb 2.2 oz (86.7 kg)  04/09/14 191 lb (86.637 kg)  12/16/13 183 lb 3.2 oz (83.099 kg)  11/03/13 183 lb 6.4 oz (83.19 kg)    BMI:  Body mass index is 22.55 kg/(m^2).  Estimated Nutritional Needs:  Kcal:  2100-2300  Protein:  110-120g  Fluid:  2.1L/day    Skin:  Reviewed, no issues  Diet Order:  .TPN (CLINIMIX-E) Adult Diet NPO time specified Except for: Ice Chips .TPN (CLINIMIX-E) Adult  EDUCATION NEEDS:  No education needs identified at this time   Intake/Output Summary (Last 24 hours) at 09/10/14 1358 Last data filed at 09/10/14 0530  Gross per 24 hour  Intake 4075.17 ml  Output    625 ml  Net 3450.17 ml    Last BM:  5/18  [name] Bibles, MS, RD, LDN Pager: [phone] After Hours Pager: [phone]

## 2014-09-10 NOTE — Progress Notes (Signed)
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 He was currently admitted for abdominal distention and high grade SBO. Status post surgery on 5/18.  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 surgery consulted - Initially treated with bowel rest, NG tube and IV fluids but had persistent high-grade obstruction - Status post exploratory laparotomy, resection of terminal ileum and right colon and small bowel bypass on 5/18 - Management per surgery - Continue TNA - Not likely has postop ileus.  Iron deficiency anemia - Status post 2 units PRBCs intraoperatively 5/18. Hemoglobin improved from 8.1 > 11.1  - Follow CBCs  Leukocytosis - Possibly related to Neulasta and surgical stress - Admitted with WBC count of 27K which had decreased to 22K on 5/18 but again up to 29 - Follow CBCs  Hypokalemia - replace as needed    Code Status: Full Family Communication: Discussed with spouse at bedside Disposition Plan: DC home when medically stable   Consultants:  CCS  Medical Oncology  Procedures:  NGT  Exploratory laparotomy, resection of terminal ileum and right colon and small bowel bypass on 5/18  Antibiotics:  Cefoxitin 5/18   Subjective: Patient complained of some abdominal pain this morning. Nausea but no vomiting. No BM postop.  Objective: Filed Vitals:   09/09/14 2103 09/09/14 2119  09/10/14 0518 09/10/14 1418  BP: 108/69  114/77 115/78  Pulse: 115 96 97 107  Temp: 97.8 F (36.6 C)  98.4 F (36.9 C) 98.3 F (36.8 C)  TempSrc: Oral  Oral Oral  Resp: 20  18 18   Height:      Weight:      SpO2: 95%  97% 95%    Intake/Output Summary (Last 24 hours) at 09/10/14 1526 Last data filed at 09/10/14 1418  Gross per 24 hour  Intake 3775.17 ml  Output    600 ml  Net 3175.17 ml   Filed Weights   09/06/14 1212  Weight: 77.52 kg (170 lb 14.4 oz)     Exam:  General exam: Moderately built and thinly nourished pleasant middle-aged male lying comfortably in bed Respiratory system: Clear. No increased work of breathing. Cardiovascular system: S1 & S2 heard, RRR. No JVD, murmurs, gallops, clicks or pedal edema. Gastrointestinal system: Abdomen is mildly distended, soft and nontender. Minimal bowel sounds heard. NG tube +. Laparotomy site dressing clean and dry. Central nervous system: Alert and oriented. No focal neurological deficits. Extremities: Symmetric 5 x 5 power.   Data Reviewed: Basic Metabolic Panel:  Recent Labs Lab 09/06/14 0932 09/07/14 0425 09/08/14 0445 09/09/14 0400 09/09/14 1313 09/10/14 0355  NA 133* 134* 134* 136 132* 136  K 3.3* 3.3* 3.4* 3.5 4.0 4.6  CL 96* 101 101 103  --  105  CO2 24 21* 25 25  --  24  GLUCOSE 126* 107* 135* 128*  --  162*  BUN 13 14 13 8   --  11  CREATININE 0.74 0.85 0.78 0.58*  --  0.72  CALCIUM 8.8* 7.8* 7.9* 7.9*  --  7.8*  MG  --   --  1.8 1.8  --  1.7  PHOS  --   --  1.8* 2.6  --  2.7   Liver Function Tests:  Recent Labs Lab 09/06/14 0932 09/07/14 0425 09/08/14 0445 09/10/14 0355  AST 34 25 24 20   ALT 19 16* 14* 12*  ALKPHOS 117 104 102 85  BILITOT 0.4 0.5 0.5 0.4  PROT 6.4* 5.3* 5.4* 4.8*  ALBUMIN 3.1* 2.5* 2.6* 2.1*    Recent Labs Lab 09/06/14 0932  LIPASE 32   No results for input(s): AMMONIA in the last 168 hours. CBC:  Recent Labs Lab 09/06/14 0932 09/07/14 0425 09/08/14 0445  09/09/14 1313 09/10/14 0355  WBC 27.1* 27.4* 22.6*  --  29.2*  NEUTROABS 23.0*  --  18.3*  --   --   HGB 10.0* 8.1* 8.1* 10.5* 11.1*  HCT 31.2* 24.9* 25.5* 31.0* 34.3*  MCV 86.4 86.2 87.9  --  86.2  PLT 258 200 185  --  196   Cardiac Enzymes: No results for input(s): CKTOTAL, CKMB, CKMBINDEX, TROPONINI in the last 168 hours. BNP (last 3 results) No results for input(s): PROBNP in the last 8760 hours. CBG:  Recent Labs Lab 09/09/14 0733 09/09/14 1435 09/09/14 1735 09/09/14 2349 09/10/14 0741  GLUCAP 111* 160* 173* 161* 134*    Recent Results (from the past 240 hour(s))  Surgical pcr screen     Status: None   Collection Time: 09/08/14 10:02 PM  Result Value Ref Range Status   MRSA, PCR NEGATIVE NEGATIVE Final   Staphylococcus aureus NEGATIVE NEGATIVE Final    Comment:        The Xpert SA Assay (FDA approved for NASAL specimens in patients over 55 years of age), is one component of a comprehensive surveillance program.  Test performance has been validated by [facility] for patients greater than or equal to 98 year old. It is not intended to diagnose infection nor to guide or monitor treatment.          Studies: No results found.      Scheduled Meds: . enoxaparin (LOVENOX) injection  40 mg Subcutaneous Q24H  . famotidine (PEPCID) IV  20 mg Intravenous Q12H  . insulin aspart  0-9 Units Subcutaneous Q8H   Continuous Infusions: . Marland KitchenTPN (CLINIMIX-E) Adult 60 mL/hr at 09/09/14 1900   And  . fat emulsion 40 kcal (09/09/14 1900)  . Marland KitchenTPN (CLINIMIX-E) Adult     And  . fat emulsion    . 0.9 % NaCl with KCl 20 mEq / L      Active Problems:   Small bowel obstruction    Time spent: 20 minutes.    Vernell Leep, MD, FACP, FHM. [facility] Pager [phone]  If 7PM-7AM, please contact night-coverage [email or web address] Password [email or web address] 09/10/2014, 3:26 PM    LOS: 4 days

## 2014-09-10 NOTE — Progress Notes (Signed)
Patient ID: Wesley Dillon, male   DOB: [birth year], 69 y.o.   MRN: [id number] 1 Day Post-Op  Subjective: Incisional pain, relieved with meds.  No other C/O  Objective: Vital signs in last 24 hours: Temp:  [97.4 F (36.3 C)-98.4 F (36.9 C)] 98.4 F (36.9 C) (05/19 0518) Pulse Rate:  [67-115] 97 (05/19 0518) Resp:  [18-20] 18 (05/19 0518) BP: (99-132)/(53-80) 114/77 mmHg (05/19 0518) SpO2:  [95 %-100 %] 97 % (05/19 0518) Last BM Date: 09/09/14 (pre op)  Intake/Output from previous day: 05/18 0701 - 05/19 0700 In: 6645.2 [I.V.:3862.5; Blood:670; IV Piggyback:300; TPN:1812.7] Out: 1040 [Urine:690; Emesis/NG output:100; Blood:250] Intake/Output this shift:    General appearance: alert, cooperative and no distress GI: Apprpriate incisional tenderness Incision/Wound:Dressing clean and dry  Lab Results:   Recent Labs  09/08/14 0445 09/09/14 1313 09/10/14 0355  WBC 22.6*  --  29.2*  HGB 8.1* 10.5* 11.1*  HCT 25.5* 31.0* 34.3*  PLT 185  --  196   BMET  Recent Labs  09/09/14 0400 09/09/14 1313 09/10/14 0355  NA 136 132* 136  K 3.5 4.0 4.6  CL 103  --  105  CO2 25  --  24  GLUCOSE 128*  --  162*  BUN 8  --  11  CREATININE 0.58*  --  0.72  CALCIUM 7.9*  --  7.8*     Studies/Results: Ct Abdomen Pelvis W Contrast  09/08/2014   ADDENDUM REPORT: 09/08/2014 13:17  ADDENDUM: The transition point is in the anterior aspect of the left mid abdomen where the small bowel wall thickening begins, best seen on sagittal image number 95 and coronal image number 25. Another differential consideration is tumor involving this portion of the small bowel, including the possibility of a primary small bowel tumor at the location of initial concentric narrowing.  These results were called by telephone at the time of interpretation on 09/08/2014 at 1:17 pm to Dr. Excell Seltzer , who verbally acknowledged these results.   Electronically Signed   By: Claudie Revering M.D.   On: 09/08/2014 13:17    09/08/2014   CLINICAL DATA:  Pattern on small bowel obstruction on radiographs earlier today. Abdominal distention. History of peritoneal adenocarcinomatosis with unknown primary. Undergoing chemotherapy.  EXAM: CT ABDOMEN AND PELVIS WITH CONTRAST  TECHNIQUE: Multidetector CT imaging of the abdomen and pelvis was performed using the standard protocol following bolus administration of intravenous contrast.  CONTRAST:  12mL OMNIPAQUE IOHEXOL 300 MG/ML  SOLN  COMPARISON:  Radiographs obtained yesterday. Abdomen and pelvis CT dated 06/30/2014.  FINDINGS: Interval small bilateral pleural effusions. There is also bilateral lower lobe, lingular and right middle lobe atelectasis.  Multiple dilated, contrast filled proximal small bowel loops as well as less dilated, fluid-filled mid small bowel loops. Normal caliber distal small bowel and colon. The duodenum is not dilated. There is swirling of the mesenteric vessels in the central abdomen, best seen on the sagittal reconstructed images (sagittal image number 69). There are also some small bowel loops in the left mid abdomen which are minimally dilated and demonstrate diffuse, low-density wall thickening, not previously present. No areas of vascular occlusion are seen.  Moderate amount of free peritoneal fluid with a significant increase. There is some loculated fluid lateral to the rectosigmoid conjunction on the left, not previously present. There is a suggestion of a small amount of vague soft tissue density associated with this fluid collection, best seen on coronal image number 84. The fluid collection measures 5.1 x  3.2 cm on image number 92. The previously demonstrated peritoneal nodularity is not currently visualized. No enlarged lymph nodes.  And upper pole left renal cyst is unchanged. The liver, spleen, pancreas, adrenal glands, right kidney and urinary bladder unremarkable. Mildly enlarged prostate gland. Small bilateral inguinal hernias containing fat.  Nasogastric tube tip in the proximal stomach. Mildly dilated gallbladder containing several tiny gallstones that appear adherent to the gallbladder wall in the fundus. No gallbladder wall thickening. Lumbar and lower thoracic spine degenerative changes. No evidence of bony metastatic disease.  IMPRESSION: 1. Partial small bowel obstruction, most likely due to an internal hernia. 2. Moderate diffuse low density wall thickening involving small bowel loops in the left mid abdomen. This could be due to ischemia or lymphatic congestion associated with the small bowel obstruction. This could also be infectious or inflammatory in nature. 3. Moderate amount of ascites, increased. 4. Interval 5.1 x 3.2 cm loculated fluid collection lateral to the rectosigmoid junction on the left with subtle soft tissue components, suspicious for a peritoneal implant of metastatic disease. 5. Otherwise, improved peritoneal metastatic disease. 6. Interval small bilateral pleural effusions and bibasilar atelectasis. 7. Small bilateral inguinal hernias containing fat and previously noted small hiatal hernia. 8. Mildly enlarged prostate gland. These results will be called to the ordering clinician or representative by the Radiologist Assistant, and communication documented in the PACS or zVision Dashboard.  Electronically Signed: By: Claudie Revering M.D. On: 09/08/2014 13:03    Anti-infectives: Anti-infectives    Start     Dose/Rate Route Frequency Ordered Stop   09/09/14 0600  cefOXitin (MEFOXIN) 2 g in dextrose 5 % 50 mL IVPB     2 g 100 mL/hr over 30 Minutes Intravenous On call to O.R. 09/08/14 1354 09/09/14 1243      Assessment/Plan: s/p Procedure(s): EXPLORATORY LAPAROTOMY resection terminal ileum and right colon SMALL BOWEL bypass COLON RESECTION RIGHT Stable post op Leukocytosis.  He had this pre op- follow OOB as tolerated Leave Foley today   LOS: 4 days    Nadya Hopwood T 09/10/2014

## 2014-09-10 NOTE — Plan of Care (Signed)
Problem: Phase I Progression Outcomes Goal: OOB as tolerated unless otherwise ordered Outcome: Progressing Dangled on side of bed for about 10 minutes this evening.

## 2014-09-10 NOTE — Progress Notes (Signed)
PARENTERAL NUTRITION CONSULT NOTE - Follow up  Pharmacy Consult for TPN Indication: SBO/ abdominal carcinomatosis  No Known Allergies  Patient Measurements: Height: 6\' 1"  (185.4 cm) Weight: 170 lb 14.4 oz (77.52 kg) IBW/kg (Calculated) : 79.9  Vital Signs: Temp: 98.4 F (36.9 C) (05/19 0518) Temp Source: Oral (05/19 0518) BP: 114/77 mmHg (05/19 0518) Pulse Rate: 97 (05/19 0518) Intake/Output from previous day: 05/18 0701 - 05/19 0700 In: 6645.2 [I.V.:3862.5; Blood:670; IV Piggyback:300; TPN:1812.7] Out: 1040 [Urine:690; Emesis/NG output:100; Blood:250] Intake/Output from this shift:    Labs:  Recent Labs  09/08/14 0445 09/09/14 1313 09/10/14 0355  WBC 22.6*  --  29.2*  HGB 8.1* 10.5* 11.1*  HCT 25.5* 31.0* 34.3*  PLT 185  --  196    Recent Labs  09/08/14 0445 09/09/14 0400 09/09/14 1313 09/10/14 0355  NA 134* 136 132* 136  K 3.4* 3.5 4.0 4.6  CL 101 103  --  105  CO2 25 25  --  24  GLUCOSE 135* 128*  --  162*  BUN 13 8  --  11  CREATININE 0.78 0.58*  --  0.72  CALCIUM 7.9* 7.9*  --  7.8*  MG 1.8 1.8  --  1.7  PHOS 1.8* 2.6  --  2.7  PROT 5.4*  --   --  4.8*  ALBUMIN 2.6*  --   --  2.1*  AST 24  --   --  20  ALT 14*  --   --  12*  ALKPHOS 102  --   --  85  BILITOT 0.5  --   --  0.4  PREALBUMIN 7.1*  --   --   --   TRIG 98  --   --   --    Estimated Creatinine Clearance: 98.2 mL/min (by C-G formula based on Cr of 0.72).    Recent Labs  09/09/14 1735 09/09/14 2349 09/10/14 0741  GLUCAP 173* 161* 134*    Medical History: Past Medical History  Diagnosis Date  . Tremor     takes Primidone 250 once daily and inderall 160 daily for this-has had it since he was 21  . Pneumonia 06/2012  . Macular degeneration   . Benign essential tremor   . Coronary artery disease   . Anemia 06/30/2014  . 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 Essential hypertension 08/18/2014   Insulin Requirements: 4 units  Current Nutrition: NPO  IVF: NS with 20 mEq  KCl at 85 ml/hr  Central access: use PAC TPN start date: 5/16  ASSESSMENT                                                                                                          HPI: 9 yoM with Gastric Ca, abd carcinomatosis, 3rd admit for SBO this year. Required TPN last admit 4/26-5/2. NG tube place, surgery consulted - Considering palliative surgery. Begin TPN today 5/16, use PAC for central access  Significant events:  5/16: K low, 4 runs KCl 10 mEq 5/17: K & Phos low, K Phos 14mMol bolus, plan  abd CT 5/18: To OR for lap resection of obstructing mass.  Today:   Glucose - CBG's up to 160 x 2, Dexamethasone 10mg  x1 pre-op 5/18    Electrolytes - Na, K, phos all improved to wnl. CorrCa wnl at ~9  Renal - wnl  LFTs - wnl  TGs - 98 (5/17)  Prealbumin - 7.1 (5/17)  UOP not measured  NUTRITIONAL GOALS                                                                                             RD recs: 2100-2300Kcal, 110-120g, 2.1L per day. Clinimix E 5/15 at a goal rate of 100 ml/hr + 20% fat emulsion at 10 ml/hr to provide: 120 g/day protein, 2180 Kcal/day.  PLAN                                                                                                                         At 1800 today:  Increase Clinimix E 5/15 to 80 ml/hr.    Cont 20% fat emulsion at 61ml/hr  Magnesium Sulfate 2gm IV bolus today  BMET & Magnesium level  Reduce IV fluid rate to 40 ml/hr  Plan to advance as tolerated to the goal rate. Advancing cautiously.  TPN to contain standard multivitamins and trace elements.  Cont Sensitive SSI q8h, anticipate short term Dexamethasone effect on CBG  TPN lab panels on Mondays & Thursdays.  F/u daily.  Minda Ditto PharmD Pager [phone] 09/10/2014, 8:56 AM

## 2014-09-11 DIAGNOSIS — K913 Postprocedural intestinal obstruction: Secondary | ICD-10-CM

## 2014-09-11 LAB — BASIC METABOLIC PANEL
Anion gap: 7 (ref 5–15)
BUN: 12 mg/dL (ref 6–20)
CALCIUM: 7.8 mg/dL — AB (ref 8.9–10.3)
CHLORIDE: 103 mmol/L (ref 101–111)
CO2: 26 mmol/L (ref 22–32)
Creatinine, Ser: 0.63 mg/dL (ref 0.61–1.24)
GFR calc Af Amer: >60 mL/min (ref >60)
Glucose, Bld: 128 mg/dL — ABNORMAL HIGH (ref 65–99)
POTASSIUM: 4.5 mmol/L (ref 3.5–5.1)
SODIUM: 136 mmol/L (ref 135–145)

## 2014-09-11 LAB — GLUCOSE, CAPILLARY
GLUCOSE-CAPILLARY: 120 mg/dL — AB (ref 65–99)
GLUCOSE-CAPILLARY: 121 mg/dL — AB (ref 65–99)
Glucose-Capillary: 112 mg/dL — ABNORMAL HIGH (ref 65–99)
Glucose-Capillary: 119 mg/dL — ABNORMAL HIGH (ref 65–99)

## 2014-09-11 LAB — CBC
HCT: 34.3 % — ABNORMAL LOW (ref 39.0–52.0)
HCT: 28.3 % — ABNORMAL LOW (ref 39.0–52.0)
Hemoglobin: 11.1 g/dL — ABNORMAL LOW (ref 13.0–17.0)
Hemoglobin: 9.2 g/dL — ABNORMAL LOW (ref 13.0–17.0)
MCH: 27.9 pg (ref 26.0–34.0)
MCH: 28.4 pg (ref 26.0–34.0)
MCHC: 32.4 g/dL (ref 30.0–36.0)
MCHC: 32.5 g/dL (ref 30.0–36.0)
MCV: 86.2 fL (ref 78.0–100.0)
MCV: 87.3 fL (ref 78.0–100.0)
PLATELETS: 196 10*3/uL (ref 150–400)
PLATELETS: 175 10*3/uL (ref 150–400)
RBC: 3.98 MIL/uL — ABNORMAL LOW (ref 4.22–5.81)
RBC: 3.24 MIL/uL — AB (ref 4.22–5.81)
RDW: 18.8 % — AB (ref 11.5–15.5)
RDW: 19.2 % — AB (ref 11.5–15.5)
WBC: 29.2 10*3/uL — ABNORMAL HIGH (ref 4.0–10.5)
WBC: 19.4 10*3/uL — ABNORMAL HIGH (ref 4.0–10.5)

## 2014-09-11 LAB — MAGNESIUM: Magnesium: 2.2 mg/dL (ref 1.7–2.4)

## 2014-09-11 MED ORDER — FAT EMULSION 20 % IV EMUL
240.0000 mL | INTRAVENOUS | Status: AC
  Administered 2014-09-11: 240 mL via INTRAVENOUS
  Filled 2014-09-11: qty 250

## 2014-09-11 MED ORDER — POTASSIUM CHLORIDE IN NACL 20-0.9 MEQ/L-% IV SOLN
INTRAVENOUS | Status: DC
  Administered 2014-09-11 – 2014-09-13 (×3): via INTRAVENOUS
  Filled 2014-09-11 (×3): qty 1000

## 2014-09-11 MED ORDER — TRACE MINERALS CR-CU-MN-SE-ZN 10-1000-500-60 MCG/ML IV SOLN
INTRAVENOUS | Status: AC
  Administered 2014-09-11: 18:00:00 via INTRAVENOUS
  Filled 2014-09-11: qty 2400

## 2014-09-11 MED ORDER — PROMETHAZINE HCL 25 MG/ML IJ SOLN
12.5000 mg | Freq: Four times a day (QID) | INTRAMUSCULAR | Status: DC | PRN
  Administered 2014-09-13: 12.5 mg via INTRAVENOUS
  Filled 2014-09-11: qty 1

## 2014-09-11 NOTE — Progress Notes (Signed)
PARENTERAL NUTRITION CONSULT NOTE - Follow up  Pharmacy Consult for TPN Indication: SBO/ abdominal carcinomatosis  No Known Allergies  Patient Measurements: Height: 6\' 1"  (185.4 cm) Weight: 170 lb 14.4 oz (77.52 kg) IBW/kg (Calculated) : 79.9  Vital Signs: Temp: 98.4 F (36.9 C) (05/20 0422) Temp Source: Oral (05/20 0422) BP: 119/76 mmHg (05/20 0422) Pulse Rate: 98 (05/20 0422) Intake/Output from previous day: 05/19 0701 - 05/20 0700 In: 2306 [I.V.:1110; IV Piggyback:100; TPN:1096] Out: 1075 [Urine:825; Emesis/NG output:250] Intake/Output from this shift: Total I/O In: 1408.3 [I.V.:433.3; TPN:975] Out: 150 [Emesis/NG output:150]  Labs:  Recent Labs  09/09/14 1313 09/10/14 0355 09/11/14 0430  WBC  --  29.2* 19.4*  HGB 10.5* 11.1* 9.2*  HCT 31.0* 34.3* 28.3*  PLT  --  196 175    Recent Labs  09/09/14 0400 09/09/14 1313 09/10/14 0355 09/11/14 0430  NA 136 132* 136 136  K 3.5 4.0 4.6 4.5  CL 103  --  105 103  CO2 25  --  24 26  GLUCOSE 128*  --  162* 128*  BUN 8  --  11 12  CREATININE 0.58*  --  0.72 0.63  CALCIUM 7.9*  --  7.8* 7.8*  MG 1.8  --  1.7 2.2  PHOS 2.6  --  2.7  --   PROT  --   --  4.8*  --   ALBUMIN  --   --  2.1*  --   AST  --   --  20  --   ALT  --   --  12*  --   ALKPHOS  --   --  85  --   BILITOT  --   --  0.4  --    Estimated Creatinine Clearance: 98.2 mL/min (by C-G formula based on Cr of 0.63).    Recent Labs  09/10/14 0741 09/10/14 1603 09/11/14 0009  GLUCAP 134* 113* 121*    Medical History: Past Medical History  Diagnosis Date  . Tremor     takes Primidone 250 once daily and inderall 160 daily for this-has had it since he was 21  . Pneumonia 06/2012  . Macular degeneration   . Benign essential tremor   . Coronary artery disease   . Anemia 06/30/2014  . 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 Essential hypertension 08/18/2014   Insulin Requirements: 4 units  Current Nutrition: NPO  IVF: NS with 20 mEq KCl at  85 ml/hr  Central access: use PAC TPN start date: 5/16  ASSESSMENT                                                                                                          HPI: 59 yoM with Gastric Ca, abd carcinomatosis, 3rd admit for SBO this year. Required TPN last admit 4/26-5/2. NG tube place, surgery consulted - Considering palliative surgery. Begin TPN today 5/16, use PAC for central access  Significant events:  5/16: K low, 4 runs KCl 10 mEq 5/17: K & Phos low, K Phos 58mMol bolus, plan abd CT 5/18: To  OR for lap resection of obstructing mass.  Today:   Glucose - CBG's stable, Dexamethasone 10mg  x1 pre-op 5/18    Electrolytes - WNL  Renal - wnl  LFTs - wnl  TGs - 98 (5/17)  Prealbumin - 7.1 (5/17)  NUTRITIONAL GOALS                                                                                             RD recs: 2100-2300Kcal, 110-120g, 2.1L per day. Clinimix E 5/15 at a goal rate of 100 ml/hr + 20% fat emulsion at 10 ml/hr to provide: 120 g/day protein, 2180 Kcal/day.  PLAN                                                                                                                         At 1800 today:  Increase Clinimix E 5/15 to goal rate of 100 ml/hr.    Cont 20% fat emulsion at 38ml/hr  [place] IVF's  Plan to advance as tolerated to the goal rate. Advancing cautiously.  TPN to contain standard multivitamins and trace elements.  Cont Sensitive SSI q8h, anticipate short term Dexamethasone effect on CBG  TPN lab panels on Mondays & Thursdays.  F/u daily.  Adrian Saran, PharmD, BCPS Pager [phone] 09/11/2014 9:33 AM

## 2014-09-11 NOTE — Progress Notes (Signed)
[facility] Progress Note  2 Days Post-Op  Subjective: Pt doing well, pain well controlled.  Some nausea last night, but no N/V today.  Ambulating well through the halls.  Urinated since foley discontinued.  Wife at bedside with him. Dr. Ammie Dalton came in to say hi.  No flatus or BM yet.    Objective: Vital signs in last 24 hours: Temp:  [98.3 F (36.8 C)-99.3 F (37.4 C)] 98.4 F (36.9 C) (05/20 0422) Pulse Rate:  [98-107] 98 (05/20 0422) Resp:  [18] 18 (05/20 0422) BP: (115-127)/(72-78) 119/76 mmHg (05/20 0422) SpO2:  [95 %-98 %] 97 % (05/20 0422) Last BM Date: 09/09/14 (pre surgery )  Intake/Output from previous day: 05/19 0701 - 05/20 0700 In: 2306 [I.V.:1110; IV Piggyback:100; TPN:1096] Out: 1075 [Urine:825; Emesis/NG output:250] Intake/Output this shift:    PE: Gen:  Alert, NAD, pleasant Card:  RRR, ?murmur, no G/R heard Pulm:  CTA, no W/R/R Abd: Soft, ND, minimally tender, very few BS, no HSM, incisions C/D/I with staples and honeycomb in place Ext:  No erythema, edema, or tenderness   Lab Results:   Recent Labs  09/10/14 0355 09/11/14 0430  WBC 29.2* 19.4*  HGB 11.1* 9.2*  HCT 34.3* 28.3*  PLT 196 175   BMET  Recent Labs  09/10/14 0355 09/11/14 0430  NA 136 136  K 4.6 4.5  CL 105 103  CO2 24 26  GLUCOSE 162* 128*  BUN 11 12  CREATININE 0.72 0.63  CALCIUM 7.8* 7.8*   PT/INR No results for input(s): LABPROT, INR in the last 72 hours. CMP     Component Value Date/Time   NA 136 09/11/2014 0430   NA 132* 08/25/2014 0817   K 4.5 09/11/2014 0430   K 3.8 08/25/2014 0817   CL 103 09/11/2014 0430   CO2 26 09/11/2014 0430   CO2 22 08/25/2014 0817   GLUCOSE 128* 09/11/2014 0430   GLUCOSE 125 08/25/2014 0817   BUN 12 09/11/2014 0430   BUN 11.5 08/25/2014 0817   CREATININE 0.63 09/11/2014 0430   CREATININE 0.6* 08/25/2014 0817   CALCIUM 7.8* 09/11/2014 0430   CALCIUM 8.0* 08/25/2014 0817   PROT 4.8* 09/10/2014 0355   PROT 5.5*  08/25/2014 0817   ALBUMIN 2.1* 09/10/2014 0355   ALBUMIN 2.5* 08/25/2014 0817   AST 20 09/10/2014 0355   AST 38* 08/25/2014 0817   ALT 12* 09/10/2014 0355   ALT 28 08/25/2014 0817   ALKPHOS 85 09/10/2014 0355   ALKPHOS 82 08/25/2014 0817   BILITOT 0.4 09/10/2014 0355   BILITOT 0.40 08/25/2014 0817   GFRNONAA >60 09/11/2014 0430   GFRAA >60 09/11/2014 0430   Lipase     Component Value Date/Time   LIPASE 32 09/06/2014 0932       Studies/Results: No results found.  Anti-infectives: Anti-infectives    Start     Dose/Rate Route Frequency Ordered Stop   09/09/14 0600  cefOXitin (MEFOXIN) 2 g in dextrose 5 % 50 mL IVPB     2 g 100 mL/hr over 30 Minutes Intravenous On call to O.R. 09/08/14 1354 09/09/14 1243       Assessment/Plan Malignant SBO with carcinomatosis POD #2 s/p resection of terminal ileum and right colon, small bowel bypass Post-operative ileus -IVF, pain control, antiemetics, NG tube and NPO until bowel function returns -Ambulate and IS (up to 1000) -SCD's and lovenox -Limit narcotics if able -Remove honeycomb dressing tomorrow on POD #3 -Staples to be removed on POD #10  Leukocytosis  down to 19.4 today ABL Anemia  -mild Hgb down to 9.2, monitor   LOS: 5 days    Coralie Keens 09/11/2014, 7:44 AM Pager: [phone]

## 2014-09-11 NOTE — Progress Notes (Signed)
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 He was currently admitted for abdominal distention and high grade SBO. Status post surgery on 5/18.  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 surgery consulted - Initially treated with bowel rest, NG tube and IV fluids but had persistent high-grade obstruction - Status post exploratory laparotomy, resection of terminal ileum and right colon and small bowel bypass on 5/18 - Management per surgery - Continue TNA - Now likely has postop ileus. May take couple days to resolve.  Iron deficiency anemia - Status post 2 units PRBCs intraoperatively 5/18. Hemoglobin improved from 8.1 > 11.1 >9.2  - Follow CBCs  Leukocytosis - Possibly related to Neulasta and surgical stress - Admitted with WBC count of 27K which had decreased to 22K on 5/18 but again up to 29 - Follow CBCs. Starting to decrease  Hypokalemia - replace as needed    Code Status: Full Family Communication: Discussed with spouse at bedside Disposition Plan: DC home when medically stable   Consultants:  CCS  Medical Oncology  Procedures:  NGT  Exploratory laparotomy, resection of terminal ileum and right colon and small bowel bypass on 5/18  Antibiotics:  Cefoxitin 5/18   Subjective: States that he's feeling better. Abdominal pain improved compared to yesterday, down to 2/10. No  flatus or BM. Ambulating in halls.  Objective: Filed Vitals:   09/10/14 1958 09/10/14 2010 09/11/14 0422 09/11/14 1356  BP:  127/72 119/76 125/73  Pulse: 106 106 98 97  Temp:  99.3 F (37.4 C) 98.4 F (36.9 C) 98.6 F (37 C)  TempSrc:  Oral Oral Oral  Resp:  18 18 16   Height:      Weight:      SpO2:  98% 97% 98%    Intake/Output Summary (Last 24 hours) at 09/11/14 1655 Last data filed at 09/11/14 1400  Gross per 24 hour  Intake 4650.33 ml  Output   1000 ml  Net 3650.33 ml   Filed Weights   09/06/14 1212  Weight: 77.52 kg (170 lb 14.4 oz)     Exam:  General exam: Moderately built and thinly nourished pleasant middle-aged male lying comfortably in bed Respiratory system: Clear. No increased work of breathing. Cardiovascular system: S1 & S2 heard, RRR. No JVD, murmurs, gallops, clicks or pedal edema. Gastrointestinal system: Abdomen is mildly distended, soft and nontender. Minimal bowel sounds heard. NG tube +. Laparotomy site dressing clean and dry. Central nervous system: Alert and oriented. No focal neurological deficits. Extremities: Symmetric 5 x 5 power.   Data Reviewed: Basic Metabolic Panel:  Recent Labs Lab 09/07/14 0425 09/08/14 0445 09/09/14 0400 09/09/14 1313 09/10/14 0355 09/11/14 0430  NA 134* 134* 136 132* 136 136  K 3.3* 3.4* 3.5 4.0 4.6 4.5  CL 101 101 103  --  105 103  CO2 21* 25 25  --  24 26  GLUCOSE 107* 135* 128*  --  162* 128*  BUN 14 13 8   --  11 12  CREATININE 0.85 0.78 0.58*  --  0.72 0.63  CALCIUM 7.8* 7.9* 7.9*  --  7.8* 7.8*  MG  --  1.8 1.8  --  1.7 2.2  PHOS  --  1.8* 2.6  --  2.7  --    Liver Function Tests:  Recent Labs Lab 09/06/14 0932 09/07/14 0425 09/08/14 0445 09/10/14 0355  AST 34 25 24 20   ALT 19 16* 14* 12*  ALKPHOS 117 104 102 85  BILITOT 0.4 0.5 0.5 0.4  PROT 6.4* 5.3* 5.4* 4.8*  ALBUMIN 3.1* 2.5* 2.6* 2.1*    Recent Labs Lab 09/06/14 0932  LIPASE 32   No results for input(s): AMMONIA in the  last 168 hours. CBC:  Recent Labs Lab 09/06/14 0932 09/07/14 0425 09/08/14 0445 09/09/14 1313 09/10/14 0355 09/11/14 0430  WBC 27.1* 27.4* 22.6*  --  29.2* 19.4*  NEUTROABS 23.0*  --  18.3*  --   --   --   HGB 10.0* 8.1* 8.1* 10.5* 11.1* 9.2*  HCT 31.2* 24.9* 25.5* 31.0* 34.3* 28.3*  MCV 86.4 86.2 87.9  --  86.2 87.3  PLT 258 200 185  --  196 175   Cardiac Enzymes: No results for input(s): CKTOTAL, CKMB, CKMBINDEX, TROPONINI in the last 168 hours. BNP (last 3 results) No results for input(s): PROBNP in the last 8760 hours. CBG:  Recent Labs Lab 09/09/14 2349 09/10/14 0741 09/10/14 1603 09/11/14 0009 09/11/14 1156  GLUCAP 161* 134* 113* 121* 112*    Recent Results (from the past 240 hour(s))  Surgical pcr screen     Status: None   Collection Time: 09/08/14 10:02 PM  Result Value Ref Range Status   MRSA, PCR NEGATIVE NEGATIVE Final   Staphylococcus aureus NEGATIVE NEGATIVE Final    Comment:        The Xpert SA Assay (FDA approved for NASAL specimens in patients over 33 years of age), is one component of a comprehensive surveillance program.  Test performance has been validated by [facility] for patients greater than or equal to 52 year old. It is not intended to diagnose infection nor to guide or monitor treatment.          Studies: No results found.      Scheduled Meds: . enoxaparin (LOVENOX) injection  40 mg Subcutaneous Q24H  . famotidine (PEPCID) IV  20 mg Intravenous Q12H  . insulin aspart  0-9 Units Subcutaneous Q8H   Continuous Infusions: . Marland KitchenTPN (CLINIMIX-E) Adult 80 mL/hr at 09/10/14 1727   And  . fat emulsion 240 mL (09/10/14 1727)  . Marland KitchenTPN (CLINIMIX-E) Adult     And  . fat emulsion    . 0.9 % NaCl with KCl 20 mEq / L      Active Problems:   Small bowel obstruction    Time spent: 20 minutes.    Vernell Leep, MD, FACP, FHM. [facility] Pager [phone]  If 7PM-7AM, please contact  night-coverage [email or web address] Password [email or web address] 09/11/2014, 4:55 PM    LOS: 5 days

## 2014-09-11 NOTE — Plan of Care (Signed)
Problem: Phase I Progression Outcomes Goal: OOB as tolerated unless otherwise ordered Outcome: Progressing Ambulated in hallway 3 times today

## 2014-09-11 NOTE — Progress Notes (Addendum)
Nutrition Follow-up  DOCUMENTATION CODES:  Severe malnutrition in context of chronic illness  INTERVENTION:  TPN per pharmacy RD to continue to monitor  NUTRITION DIAGNOSIS:  Malnutrition related to chronic illness as evidenced by percent weight loss, severe depletion of body fat, severe depletion of muscle mass.  Ongoing.  GOAL:  Patient will meet greater than or equal to 90% of their needs  Unmet.  MONITOR:  Labs, Weight trends, Skin, I & O's, Other (Comment) (TPN tolerance)  ASSESSMENT: 69 yo male w/ relatively new dx of abdominal carcinomatosis that is being treated by Dr Wesley Dillon w/ FOLFOX therapy, next dose due Tuesday . He presents to the ED after having developed intolerance to PO w/ nausea and emesis in association w/ his baseline of abdominal distension and a few recent admissions for SBO.  S/p 5/18 Procedure(s): EXPLORATORY LAPAROTOMY resection terminal ileum and right colon, SMALL BOWEL bypass  5/16: Pt reports eating lunch and dinner on 5/14 with no issues. Pt is following a soft diet at home. Pt states N/V started on 5/15.  Pt currently NPO d/t bowel rest for SBO, TPN initiated 5/17.  Pt with 11 lb of weight loss since 4/19 (6% weight loss x 1 month, siginificant for time frame).  5/19: -Pt walking around the unit, tolerating TPN with no issues.  5/20: -Per surgery, pt had some nausea last night. No N/V today.  -Pt walking in the halls today. No BM yet. -Pt still with NGT for suction -TPN will advance to goal tonight.  Plan per Pharmacy: At 1800 today:  Increase Clinimix E 5/15 to goal rate of 100 ml/hr.  Cont 20% fat emulsion at 11ml/hr  Goal:Clinimix E 5/15 at a goal rate of 100 ml/hr + 20% fat emulsion at 10 ml/hr to provide: 120 g/day protein, 2180 Kcal/day.  Labs reviewed: CBGs: 113-134  Height:  Ht Readings from Last 1 Encounters:  09/06/14 6\' 1"  (1.854 m)    Weight:  Wt Readings from Last 1 Encounters:  09/06/14 170 lb 14.4  oz (77.52 kg)    Ideal Body Weight:  83.6 kg  Wt Readings from Last 10 Encounters:  09/06/14 170 lb 14.4 oz (77.52 kg)  08/25/14 179 lb 11.2 oz (81.511 kg)  08/18/14 173 lb (78.472 kg)  08/11/14 181 lb 12.8 oz (82.464 kg)  07/28/14 181 lb 12.8 oz (82.464 kg)  07/08/14 183 lb 11.2 oz (83.326 kg)  07/02/14 191 lb 2.2 oz (86.7 kg)  04/09/14 191 lb (86.637 kg)  12/16/13 183 lb 3.2 oz (83.099 kg)  11/03/13 183 lb 6.4 oz (83.19 kg)    BMI:  Body mass index is 22.55 kg/(m^2).  Estimated Nutritional Needs:  Kcal:  2100-2300  Protein:  110-120g  Fluid:  2.1L/day   Skin:  Reviewed, no issues  Diet Order:  Diet NPO time specified Except for: Ice Chips .TPN (CLINIMIX-E) Adult .TPN (CLINIMIX-E) Adult  EDUCATION NEEDS:  No education needs identified at this time   Intake/Output Summary (Last 24 hours) at 09/11/14 1146 Last data filed at 09/11/14 0730  Gross per 24 hour  Intake 3714.33 ml  Output   1025 ml  Net 2689.33 ml    Last BM:  5/18  Wesley Bibles, MS, RD, LDN Pager: [phone] After Hours Pager: [phone]

## 2014-09-12 DIAGNOSIS — R05 Cough: Secondary | ICD-10-CM

## 2014-09-12 LAB — GLUCOSE, CAPILLARY
GLUCOSE-CAPILLARY: 132 mg/dL — AB (ref 65–99)
GLUCOSE-CAPILLARY: 140 mg/dL — AB (ref 65–99)
Glucose-Capillary: 111 mg/dL — ABNORMAL HIGH (ref 65–99)

## 2014-09-12 LAB — CBC
HCT: 25.4 % — ABNORMAL LOW (ref 39.0–52.0)
Hemoglobin: 8.2 g/dL — ABNORMAL LOW (ref 13.0–17.0)
MCH: 28.1 pg (ref 26.0–34.0)
MCHC: 32.3 g/dL (ref 30.0–36.0)
MCV: 87 fL (ref 78.0–100.0)
Platelets: 188 10*3/uL (ref 150–400)
RBC: 2.92 MIL/uL — ABNORMAL LOW (ref 4.22–5.81)
RDW: 18.8 % — ABNORMAL HIGH (ref 11.5–15.5)
WBC: 18.1 10*3/uL — ABNORMAL HIGH (ref 4.0–10.5)

## 2014-09-12 LAB — BASIC METABOLIC PANEL
Anion gap: 7 (ref 5–15)
BUN: 13 mg/dL (ref 6–20)
CO2: 26 mmol/L (ref 22–32)
Calcium: 7.6 mg/dL — ABNORMAL LOW (ref 8.9–10.3)
Chloride: 99 mmol/L — ABNORMAL LOW (ref 101–111)
Creatinine, Ser: 0.49 mg/dL — ABNORMAL LOW (ref 0.61–1.24)
Glucose, Bld: 131 mg/dL — ABNORMAL HIGH (ref 65–99)
POTASSIUM: 4.1 mmol/L (ref 3.5–5.1)
SODIUM: 132 mmol/L — AB (ref 135–145)

## 2014-09-12 MED ORDER — FAT EMULSION 20 % IV EMUL
240.0000 mL | INTRAVENOUS | Status: AC
  Administered 2014-09-12: 240 mL via INTRAVENOUS
  Filled 2014-09-12: qty 250

## 2014-09-12 MED ORDER — M.V.I. ADULT IV INJ
INJECTION | INTRAVENOUS | Status: AC
  Administered 2014-09-12: 17:00:00 via INTRAVENOUS
  Filled 2014-09-12: qty 2400

## 2014-09-12 MED ORDER — GUAIFENESIN-DM 100-10 MG/5ML PO SYRP
5.0000 mL | ORAL_SOLUTION | Freq: Four times a day (QID) | ORAL | Status: DC | PRN
  Administered 2014-09-12 – 2014-09-13 (×3): 5 mL via ORAL
  Filled 2014-09-12 (×3): qty 10

## 2014-09-12 NOTE — Progress Notes (Signed)
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 He was currently admitted for abdominal distention and high grade SBO. Status post surgery on 5/18.  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 surgery following - Initially treated with bowel rest, NG tube and IV fluids but had persistent high-grade obstruction - Status post exploratory laparotomy, resection of terminal ileum and right colon and small bowel bypass on 5/18 - Management per surgery - Continue TNA - Now postop ileus. May take couple days to resolve.  Iron deficiency anemia - Status post 2 units PRBCs intraoperatively 5/18. Hemoglobin improved from 8.1 > 11.1 posttransfusion but gradually dropping - Hemoglobin 8.2 today. Follow CBCs and transfuse if hemoglobin less than 7 g per DL.  Leukocytosis - Possibly related to Neulasta and surgical stress - Admitted with WBC count of 27K which had decreased to 22K on 5/18 but again up to 29 - Follow CBCs. Starting to decrease  Hypokalemia - replace as needed  Severe malnutrition in context of chronic illness -Management per dietitian. Currently on TNA   Nonproductive cough - Possibly from NG tube irritation - In the absence of fever or productive cough, pneumonia felt less likely. Patient has had leukocytosis for a while - CT abdomen 5/17 showed small bilateral  pleural effusion and atelectasis - Treat symptomatically with Robitussin-DM, incentive spirometry and monitor. If cough worsens or gallops fever, dyspnea and then will obtain chest x-ray to further evaluate. Discussed with patient and spouse and they are agreeable to this plan.  DVT prophylaxis: Lovenox Code Status: Full Family Communication: Discussed with spouse at bedside Disposition Plan: DC home when medically stable-not medically stable for discharge.   Consultants:  CCS  Medical Oncology  Procedures:  NGT  Exploratory laparotomy, resection of terminal ileum and right colon and small bowel bypass on 5/18  Antibiotics:  Cefoxitin 5/18   Subjective: Complains of mild intermittent dry cough and feels it is due to NG tube irritation. Denies dyspnea or fevers. Abdominal pain controlled. No flatus or BM. Left shoulder pain-may be related to lying in bed  Objective: Filed Vitals:   09/11/14 0422 09/11/14 1356 09/11/14 2308 09/12/14 0458  BP: 119/76 125/73 125/72 112/78  Pulse: 98 97 94 86  Temp: 98.4 F (36.9 C) 98.6 F (37 C) 98.7 F (37.1 C) 98.2 F (36.8 C)  TempSrc: Oral Oral Oral Oral  Resp: 18 16 16 16   Height:      Weight:      SpO2: 97% 98% 98% 96%    Intake/Output Summary (Last 24 hours) at 09/12/14 1214 Last data filed at 09/12/14 0425  Gross per 24 hour  Intake    936 ml  Output   1150 ml  Net   -214 ml   Filed Weights   09/06/14 1212  Weight: 77.52 kg (170 lb 14.4 oz)     Exam:  General exam: Moderately built and thinly nourished pleasant middle-aged male lying comfortably in bed Respiratory system: Diminished breath sounds in the bases  with? Bronchial breath sounds in the right base but no crackles or rhonchi. Rest of lung fields clear to auscultation. No increased work of breathing. Cardiovascular system: S1 & S2 heard, RRR. No JVD, murmurs, gallops, clicks or pedal edema. Gastrointestinal system: Abdomen is non- distended, soft and  nontender. Improving bowel sounds. NG tube +. Laparotomy site dressing clean and dry. Central nervous system: Alert and oriented. No focal neurological deficits. Extremities: Symmetric 5 x 5 power.   Data Reviewed: Basic Metabolic Panel:  Recent Labs Lab 09/08/14 0445 09/09/14 0400 09/09/14 1313 09/10/14 0355 09/11/14 0430 09/12/14 0403  NA 134* 136 132* 136 136 132*  K 3.4* 3.5 4.0 4.6 4.5 4.1  CL 101 103  --  105 103 99*  CO2 25 25  --  24 26 26   GLUCOSE 135* 128*  --  162* 128* 131*  BUN 13 8  --  11 12 13   CREATININE 0.78 0.58*  --  0.72 0.63 0.49*  CALCIUM 7.9* 7.9*  --  7.8* 7.8* 7.6*  MG 1.8 1.8  --  1.7 2.2  --   PHOS 1.8* 2.6  --  2.7  --   --    Liver Function Tests:  Recent Labs Lab 09/06/14 0932 09/07/14 0425 09/08/14 0445 09/10/14 0355  AST 34 25 24 20   ALT 19 16* 14* 12*  ALKPHOS 117 104 102 85  BILITOT 0.4 0.5 0.5 0.4  PROT 6.4* 5.3* 5.4* 4.8*  ALBUMIN 3.1* 2.5* 2.6* 2.1*    Recent Labs Lab 09/06/14 0932  LIPASE 32   No results for input(s): AMMONIA in the last 168 hours. CBC:  Recent Labs Lab 09/06/14 0932 09/07/14 0425 09/08/14 0445 09/09/14 1313 09/10/14 0355 09/11/14 0430 09/12/14 0403  WBC 27.1* 27.4* 22.6*  --  29.2* 19.4* 18.1*  NEUTROABS 23.0*  --  18.3*  --   --   --   --   HGB 10.0* 8.1* 8.1* 10.5* 11.1* 9.2* 8.2*  HCT 31.2* 24.9* 25.5* 31.0* 34.3* 28.3* 25.4*  MCV 86.4 86.2 87.9  --  86.2 87.3 87.0  PLT 258 200 185  --  196 175 188   Cardiac Enzymes: No results for input(s): CKTOTAL, CKMB, CKMBINDEX, TROPONINI in the last 168 hours. BNP (last 3 results) No results for input(s): PROBNP in the last 8760 hours. CBG:  Recent Labs Lab 09/11/14 0009 09/11/14 1156 09/11/14 1652 09/11/14 2307 09/12/14 0752  GLUCAP 121* 112* 119* 120* 111*    Recent Results (from the past 240 hour(s))  Surgical pcr screen     Status: None   Collection Time: 09/08/14 10:02 PM  Result Value Ref Range Status   MRSA, PCR NEGATIVE  NEGATIVE Final   Staphylococcus aureus NEGATIVE NEGATIVE Final    Comment:        The Xpert SA Assay (FDA approved for NASAL specimens in patients over 64 years of age), is one component of a comprehensive surveillance program.  Test performance has been validated by [facility] for patients greater than or equal to 77 year old. It is not intended to diagnose infection nor to guide or monitor treatment.          Studies: No results found.      Scheduled Meds: . enoxaparin (LOVENOX) injection  40 mg Subcutaneous Q24H  . famotidine (PEPCID) IV  20 mg Intravenous Q12H  . insulin aspart  0-9 Units Subcutaneous Q8H   Continuous Infusions: . Marland KitchenTPN (CLINIMIX-E) Adult 100 mL/hr at 09/11/14 1742   And  .  fat emulsion 240 mL (09/11/14 1742)  . 0.9 % NaCl with KCl 20 mEq / L 10 mL/hr at 09/12/14 0028  . Marland KitchenTPN (CLINIMIX-E) Adult     And  . fat emulsion      Active Problems:   Small bowel obstruction    Time spent: 20 minutes.    Vernell Leep, MD, FACP, FHM. [facility] Pager [phone]  If 7PM-7AM, please contact night-coverage [email or web address] Password [email or web address] 09/12/2014, 12:14 PM    LOS: 6 days

## 2014-09-12 NOTE — Progress Notes (Signed)
PARENTERAL NUTRITION CONSULT NOTE - Follow up  Pharmacy Consult for TPN Indication: SBO/ abdominal carcinomatosis  No Known Allergies  Patient Measurements: Height: 6\' 1"  (185.4 cm) Weight: 170 lb 14.4 oz (77.52 kg) IBW/kg (Calculated) : 79.9  Vital Signs: Temp: 98.2 F (36.8 C) (05/21 0458) Temp Source: Oral (05/21 0458) BP: 112/78 mmHg (05/21 0458) Pulse Rate: 86 (05/21 0458) Intake/Output from previous day: 05/20 0701 - 05/21 0700 In: 2344.3 [I.V.:772.3; TPN:1572] Out: 1300 [Urine:750; Emesis/NG output:550] Intake/Output from this shift:    Labs:  Recent Labs  09/10/14 0355 09/11/14 0430 09/12/14 0403  WBC 29.2* 19.4* 18.1*  HGB 11.1* 9.2* 8.2*  HCT 34.3* 28.3* 25.4*  PLT 196 175 188    Recent Labs  09/10/14 0355 09/11/14 0430 09/12/14 0403  NA 136 136 132*  K 4.6 4.5 4.1  CL 105 103 99*  CO2 24 26 26   GLUCOSE 162* 128* 131*  BUN 11 12 13   CREATININE 0.72 0.63 0.49*  CALCIUM 7.8* 7.8* 7.6*  MG 1.7 2.2  --   PHOS 2.7  --   --   PROT 4.8*  --   --   ALBUMIN 2.1*  --   --   AST 20  --   --   ALT 12*  --   --   ALKPHOS 85  --   --   BILITOT 0.4  --   --    Estimated Creatinine Clearance: 98.2 mL/min (by C-G formula based on Cr of 0.49).    Recent Labs  09/11/14 1652 09/11/14 2307 09/12/14 0752  GLUCAP 119* 120* 111*    Medical History: Past Medical History  Diagnosis Date  . Tremor     takes Primidone 250 once daily and inderall 160 daily for this-has had it since he was 21  . Pneumonia 06/2012  . Macular degeneration   . Benign essential tremor   . Coronary artery disease   . Anemia 06/30/2014  . 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 Essential hypertension 08/18/2014   Insulin Requirements: 2 units  Current Nutrition: NPO  IVF: NS with 20 mEq KCl at 10 ml/hr  Central access: use PAC TPN start date: 5/16  ASSESSMENT                                                                                                          HPI:  6 yoM with Gastric Ca, abd carcinomatosis, 3rd admit for SBO this year. Required TPN last admit 4/26-5/2. NG tube place, surgery consulted - Considering palliative surgery. Begin TPN today 5/16, use PAC for central access  Significant events:  5/16: K low, 4 runs KCl 10 mEq 5/17: K & Phos low, K Phos 4mMol bolus, plan abd CT 5/18: To OR for lap resection of obstructing mass.  Today:   Glucose - CBG's stable    Electrolytes - NA low, all others WNL  Renal - wnl  LFTs - wnl  TGs - 98 (5/17)  Prealbumin - 7.1 (5/17)  NUTRITIONAL GOALS  RD recs: 2100-2300Kcal, 110-120g, 2.1L per day. Clinimix E 5/15 at a goal rate of 100 ml/hr + 20% fat emulsion at 10 ml/hr to provide: 120 g/day protein, 2180 Kcal/day.  PLAN                                                                                                                         At 1800 today:  Continue Clinimix E 5/15 at goal rate of 100 ml/hr.    Cont 20% fat emulsion at 74ml/hr  KVO IVF's  F/u tolerance at goal rate.  TPN to contain standard multivitamins and trace elements.  Cont Sensitive SSI q8h  TPN lab panels on Mondays & Thursdays.  F/u daily.  Dolly Rias RPh 09/12/2014, 9:40 AM Pager [phone]

## 2014-09-12 NOTE — Progress Notes (Signed)
Patient ID: ORIE BAXENDALE, male   DOB: [birth year], 69 y.o.   MRN: [id number] 3 Days Post-Op  Subjective: Dry cough from NG which makes his incision hurt.  No flatus or BM yet.  Objective: Vital signs in last 24 hours: Temp:  [98.2 F (36.8 C)-98.7 F (37.1 C)] 98.2 F (36.8 C) (05/21 0458) Pulse Rate:  [86-97] 86 (05/21 0458) Resp:  [16] 16 (05/21 0458) BP: (112-125)/(72-78) 112/78 mmHg (05/21 0458) SpO2:  [96 %-98 %] 96 % (05/21 0458) Last BM Date: 09/09/14  Intake/Output from previous day: 05/20 0701 - 05/21 0700 In: 2344.3 [I.V.:772.3; TPN:1572] Out: 1300 [Urine:750; Emesis/NG output:550] Intake/Output this shift:    General appearance: alert, cooperative and no distress GI: Moderate distention, minimal tenderness Incision/Wound:Clean and dry  Lab Results:   Recent Labs  09/11/14 0430 09/12/14 0403  WBC 19.4* 18.1*  HGB 9.2* 8.2*  HCT 28.3* 25.4*  PLT 175 188   BMET  Recent Labs  09/11/14 0430 09/12/14 0403  NA 136 132*  K 4.5 4.1  CL 103 99*  CO2 26 26  GLUCOSE 128* 131*  BUN 12 13  CREATININE 0.63 0.49*  CALCIUM 7.8* 7.6*     Studies/Results: No results found.  Anti-infectives: Anti-infectives    Start     Dose/Rate Route Frequency Ordered Stop   09/09/14 0600  cefOXitin (MEFOXIN) 2 g in dextrose 5 % 50 mL IVPB     2 g 100 mL/hr over 30 Minutes Intravenous On call to O.R. 09/08/14 1354 09/09/14 1243      Assessment/Plan: s/p Procedure(s): EXPLORATORY LAPAROTOMY resection terminal ileum and right colon SMALL BOWEL bypass COLON RESECTION RIGHT Stable Expected ileus. Cont NG, TPN   LOS: 6 days    Shany Marinez T 09/12/2014

## 2014-09-13 LAB — CBC
HCT: 26.5 % — ABNORMAL LOW (ref 39.0–52.0)
Hemoglobin: 8.6 g/dL — ABNORMAL LOW (ref 13.0–17.0)
MCH: 28.1 pg (ref 26.0–34.0)
MCHC: 32.5 g/dL (ref 30.0–36.0)
MCV: 86.6 fL (ref 78.0–100.0)
Platelets: 280 10*3/uL (ref 150–400)
RBC: 3.06 MIL/uL — AB (ref 4.22–5.81)
RDW: 18.7 % — AB (ref 11.5–15.5)
WBC: 15.9 10*3/uL — ABNORMAL HIGH (ref 4.0–10.5)

## 2014-09-13 LAB — CREATININE, SERUM: Creatinine, Ser: 0.46 mg/dL — ABNORMAL LOW (ref 0.61–1.24)

## 2014-09-13 LAB — GLUCOSE, CAPILLARY: Glucose-Capillary: 127 mg/dL — ABNORMAL HIGH (ref 65–99)

## 2014-09-13 MED ORDER — ACETAMINOPHEN 325 MG PO TABS
650.0000 mg | ORAL_TABLET | Freq: Once | ORAL | Status: AC
  Administered 2014-09-13: 650 mg via ORAL
  Filled 2014-09-13: qty 2

## 2014-09-13 MED ORDER — TRACE MINERALS CR-CU-MN-SE-ZN 10-1000-500-60 MCG/ML IV SOLN
INTRAVENOUS | Status: AC
  Administered 2014-09-13: 18:00:00 via INTRAVENOUS
  Filled 2014-09-13: qty 2400

## 2014-09-13 MED ORDER — FAT EMULSION 20 % IV EMUL
240.0000 mL | INTRAVENOUS | Status: AC
  Administered 2014-09-13: 240 mL via INTRAVENOUS
  Filled 2014-09-13: qty 250

## 2014-09-13 NOTE — Progress Notes (Signed)
PARENTERAL NUTRITION CONSULT NOTE - Follow up  Pharmacy Consult for TPN Indication: SBO/ abdominal carcinomatosis  No Known Allergies  Patient Measurements: Height: 6\' 1"  (185.4 cm) Weight: 170 lb 14.4 oz (77.52 kg) IBW/kg (Calculated) : 79.9  Vital Signs: Temp: 98.3 F (36.8 C) (05/22 0443) Temp Source: Oral (05/22 0443) BP: 124/80 mmHg (05/22 0443) Pulse Rate: 95 (05/22 0443) Intake/Output from previous day: 05/21 0701 - 05/22 0700 In: -  Out: 2925 [Urine:1775; Emesis/NG output:1150] Intake/Output from this shift:    Labs:  Recent Labs  09/11/14 0430 09/12/14 0403 09/13/14 0411  WBC 19.4* 18.1* 15.9*  HGB 9.2* 8.2* 8.6*  HCT 28.3* 25.4* 26.5*  PLT 175 188 280    Recent Labs  09/11/14 0430 09/12/14 0403 09/13/14 0411  NA 136 132*  --   K 4.5 4.1  --   CL 103 99*  --   CO2 26 26  --   GLUCOSE 128* 131*  --   BUN 12 13  --   CREATININE 0.63 0.49* 0.46*  CALCIUM 7.8* 7.6*  --   MG 2.2  --   --    Estimated Creatinine Clearance: 98.2 mL/min (by C-G formula based on Cr of 0.46).    Recent Labs  09/12/14 1656 09/12/14 2349 09/13/14 0838  GLUCAP 140* 132* 127*    Medical History: Past Medical History  Diagnosis Date  . Tremor     takes Primidone 250 once daily and inderall 160 daily for this-has had it since he was 21  . Pneumonia 06/2012  . Macular degeneration   . Benign essential tremor   . Coronary artery disease   . Anemia 06/30/2014  . 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 Essential hypertension 08/18/2014   Insulin Requirements: 2 units  Current Nutrition: NPO  IVF: NS with 20 mEq KCl at 10 ml/hr  Central access: use PAC TPN start date: 5/16  ASSESSMENT                                                                                                          HPI: 56 yoM with Gastric Ca, abd carcinomatosis, 3rd admit for SBO this year. Required TPN last admit 4/26-5/2. NG tube place, surgery consulted - Considering palliative  surgery. Begin TPN today 5/16, use PAC for central access  Significant events:  5/16: K low, 4 runs KCl 10 mEq 5/17: K & Phos low, K Phos 52mMol bolus, plan abd CT 5/18: To OR for lap resection of obstructing mass.  Today:   Glucose - CBG's stable    Electrolytes - NA low, all others WNL  Renal - wnl  LFTs - wnl  TGs - 98 (5/17)  Prealbumin - 7.1 (5/17)  NUTRITIONAL GOALS  RD recs: 2100-2300Kcal, 110-120g, 2.1L per day. Clinimix E 5/15 at a goal rate of 100 ml/hr + 20% fat emulsion at 10 ml/hr to provide: 120 g/day protein, 2180 Kcal/day.  PLAN                                                                                                                         At 1800 today:  Continue Clinimix E 5/15 at goal rate of 100 ml/hr.    Cont 20% fat emulsion at 80ml/hr  KVO IVF's  F/u tolerance at goal rate.  TPN to contain standard multivitamins and trace elements.  Cont Sensitive SSI q8h  TPN lab panels on Mondays & Thursdays.  F/u daily.  Dolly Rias RPh 09/13/2014, 9:18 AM Pager [phone]

## 2014-09-13 NOTE — Progress Notes (Signed)
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 He was currently admitted for abdominal distention and high grade SBO. Status post surgery on 5/18.  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 surgery following - Initially treated with bowel rest, NG tube and IV fluids but had persistent high-grade obstruction - Status post exploratory laparotomy, resection of terminal ileum and right colon and small bowel bypass on 5/18 - Management per surgery - Continue TNA - Now postop ileus. May take couple days to resolve. - Had a loose BM on 5/22 AM-ileus may be starting to resolve. Surgery clamping NG tube and may remove it 5/23.  Iron deficiency anemia - Status post 2 units PRBCs intraoperatively 5/18. Hemoglobin improved from 8.1 > 11.1 posttransfusion but gradually dropped to the 8 g range and stable for the last 2 days  Leukocytosis - Possibly related to Neulasta and surgical stress - Admitted with WBC count of 27K which had decreased to 22K on 5/18 but again up to 29 - Gradually decreasing  Hypokalemia - replace as needed  Severe malnutrition in context of chronic illness -Management per dietitian. Currently on TNA   Nonproductive cough - Possibly from NG tube irritation - In the absence of fever or productive cough, pneumonia felt less likely. Patient has had  leukocytosis for a while - CT abdomen 5/17 showed small bilateral pleural effusion and atelectasis - Treat symptomatically with Robitussin-DM, incentive spirometry and monitor. If cough worsens or gallops fever, dyspnea and then will obtain chest x-ray to further evaluate. Discussed with patient and spouse and they are agreeable to this plan. - Improved.  DVT prophylaxis: Lovenox Code Status: Full Family Communication: Discussed with spouse at bedside Disposition Plan: DC home when medically stable-not medically stable for discharge.   Consultants:  [facility]  Procedures:  NGT  Exploratory laparotomy, resection of terminal ileum and right colon and small bowel bypass on 5/18  Antibiotics:  Cefoxitin 5/18   Subjective: Had significant abdominal pain at 4 AM today which improved after IV Dilaudid. Subsequently had a loose BM at 8:30 AM. Feels better.   Objective: Filed Vitals:   09/12/14 1417 09/12/14 2026 09/12/14 2355 09/13/14 0443  BP: 134/72 121/73  124/80  Pulse: 95 107  95  Temp: 98.3 F (36.8 C) 100.1 F (37.8 C) 100.3 F (37.9 C) 98.3 F (36.8 C)  TempSrc: Oral Oral Oral Oral  Resp: 16 18  16   Height:      Weight:      SpO2: 100% 96%  97%    Intake/Output Summary (Last 24 hours) at 09/13/14 1234 Last data filed at 09/13/14 4196  Gross per 24 hour  Intake      0 ml  Output   2625 ml  Net  -2625 ml   Filed Weights   09/06/14 1212  Weight: 77.52 kg (170 lb 14.4 oz)     Exam:  General exam: Moderately built and thinly nourished pleasant middle-aged mseen ambulating comfortably in the halls  with his wife. Respiratory system: Diminished breath sounds in the bases with? Bronchial breath sounds in the right base but no crackles or rhonchi. Rest of lung fields clear to auscultation. No increased work of breathing. Cardiovascular system: S1 & S2 heard, RRR. No JVD, murmurs, gallops, clicks or pedal edema. Gastrointestinal system: Abdomen is   mildly distended, soft and nontender. Improving bowel sounds. NG tube +. Laparotomy site dressing clean and dry. Central nervous system: Alert and oriented. No focal neurological deficits. Extremities: Symmetric 5 x 5 power.   Data Reviewed: Basic Metabolic Panel:  Recent Labs Lab 09/08/14 0445 09/09/14 0400 09/09/14 1313 09/10/14 0355 09/11/14 0430 09/12/14 0403 09/13/14 0411  NA 134* 136 132* 136 136 132*  --   K 3.4* 3.5 4.0 4.6 4.5 4.1  --   CL 101 103  --  105 103 99*  --   CO2 25 25  --  24 26 26   --   GLUCOSE 135* 128*  --  162* 128* 131*  --   BUN 13 8  --  11 12 13   --   CREATININE 0.78 0.58*  --  0.72 0.63 0.49* 0.46*  CALCIUM 7.9* 7.9*  --  7.8* 7.8* 7.6*  --   MG 1.8 1.8  --  1.7 2.2  --   --   PHOS 1.8* 2.6  --  2.7  --   --   --    Liver Function Tests:  Recent Labs Lab 09/07/14 0425 09/08/14 0445 09/10/14 0355  AST 25 24 20   ALT 16* 14* 12*  ALKPHOS 104 102 85  BILITOT 0.5 0.5 0.4  PROT 5.3* 5.4* 4.8*  ALBUMIN 2.5* 2.6* 2.1*   No results for input(s): LIPASE, AMYLASE in the last 168 hours. No results for input(s): AMMONIA in the last 168 hours. CBC:  Recent Labs Lab 09/08/14 0445 09/09/14 1313 09/10/14 0355 09/11/14 0430 09/12/14 0403 09/13/14 0411  WBC 22.6*  --  29.2* 19.4* 18.1* 15.9*  NEUTROABS 18.3*  --   --   --   --   --   HGB 8.1* 10.5* 11.1* 9.2* 8.2* 8.6*  HCT 25.5* 31.0* 34.3* 28.3* 25.4* 26.5*  MCV 87.9  --  86.2 87.3 87.0 86.6  PLT 185  --  196 175 188 280   Cardiac Enzymes: No results for input(s): CKTOTAL, CKMB, CKMBINDEX, TROPONINI in the last 168 hours. BNP (last 3 results) No results for input(s): PROBNP in the last 8760 hours. CBG:  Recent Labs Lab 09/11/14 2307 09/12/14 0752 09/12/14 1656 09/12/14 2349 09/13/14 0838  GLUCAP 120* 111* 140* 132* 127*    Recent Results (from the past 240 hour(s))  Surgical pcr screen     Status: None   Collection Time: 09/08/14 10:02 PM  Result Value Ref Range Status    MRSA, PCR NEGATIVE NEGATIVE Final   Staphylococcus aureus NEGATIVE NEGATIVE Final    Comment:        The Xpert SA Assay (FDA approved for NASAL specimens in patients over 61 years of age), is one component of a comprehensive surveillance program.  Test performance has been validated by [facility] for patients greater than or equal to 87 year old. It is not intended to diagnose infection nor to guide or monitor treatment.          Studies: No results found.      Scheduled Meds: . enoxaparin (LOVENOX) injection  40 mg Subcutaneous Q24H  . famotidine (PEPCID) IV  20 mg Intravenous  Q12H  . insulin aspart  0-9 Units Subcutaneous Q8H   Continuous Infusions: . 0.9 % NaCl with KCl 20 mEq / L 10 mL/hr at 09/12/14 0028  . Marland KitchenTPN (CLINIMIX-E) Adult 100 mL/hr at 09/12/14 1722   And  . fat emulsion 240 mL (09/12/14 1721)  . Marland KitchenTPN (CLINIMIX-E) Adult     And  . fat emulsion      Active Problems:   Small bowel obstruction    Time spent: 20 minutes.    Vernell Leep, MD, FACP, FHM. [facility] Pager [phone]  If 7PM-7AM, please contact night-coverage [email or web address] Password [facility] 09/13/2014, 12:34 PM    LOS: 7 days

## 2014-09-13 NOTE — Progress Notes (Signed)
The patient requested I hook his NG back up to low intermittent wall suction. He stated he felt slightly nauseated, but wasn't sure if it was from his cough. I hooked him back up and some liquid came from his tube, but it wasn't enough to reach the canister. He prefers to stay hooked up to suction for now, will monitor patient closely.

## 2014-09-13 NOTE — Progress Notes (Signed)
Patient ID: Wesley Dillon, male   DOB: 06-Jun-1945, 69 y.o.   MRN: [id number] 4 Days Post-Op  Subjective: Increased abdominal pain last night but better this AM after meds.  Had a loose BM this AM  Objective: Vital signs in last 24 hours: Temp:  [98.3 F (36.8 C)-100.3 F (37.9 C)] 98.3 F (36.8 C) (05/22 0443) Pulse Rate:  [95-107] 95 (05/22 0443) Resp:  [16-18] 16 (05/22 0443) BP: (121-134)/(72-80) 124/80 mmHg (05/22 0443) SpO2:  [96 %-100 %] 97 % (05/22 0443) Last BM Date: 09/09/14  Intake/Output from previous day: 05/21 0701 - 05/22 0700 In: -  Out: 2925 [Urine:1775; Emesis/NG output:1150] Intake/Output this shift:    General appearance: alert, cooperative and no distress GI: Moderate distention, mild tenderness Incision/Wound: Clean and dry  Lab Results:   Recent Labs  09/12/14 0403 09/13/14 0411  WBC 18.1* 15.9*  HGB 8.2* 8.6*  HCT 25.4* 26.5*  PLT 188 280   BMET  Recent Labs  09/11/14 0430 09/12/14 0403 09/13/14 0411  NA 136 132*  --   K 4.5 4.1  --   CL 103 99*  --   CO2 26 26  --   GLUCOSE 128* 131*  --   BUN 12 13  --   CREATININE 0.63 0.49* 0.46*  CALCIUM 7.8* 7.6*  --      Studies/Results: No results found.  Anti-infectives: Anti-infectives    Start     Dose/Rate Route Frequency Ordered Stop   09/09/14 0600  cefOXitin (MEFOXIN) 2 g in dextrose 5 % 50 mL IVPB     2 g 100 mL/hr over 30 Minutes Intravenous On call to O.R. 09/08/14 1354 09/09/14 1243      Assessment/Plan: s/p Procedure(s): EXPLORATORY LAPAROTOMY resection terminal ileum and right colon SMALL BOWEL bypass COLON RESECTION RIGHT Gradual improvement post op.  Clamp NG today   LOS: 7 days    Sadia Belfiore T 09/13/2014

## 2014-09-14 LAB — DIFFERENTIAL
BASOS ABS: 0 10*3/uL (ref 0.0–0.1)
Basophils Relative: 0 % (ref 0–1)
Eosinophils Absolute: 0 10*3/uL (ref 0.0–0.7)
Eosinophils Relative: 0 % (ref 0–5)
LYMPHS ABS: 1.4 10*3/uL (ref 0.7–4.0)
LYMPHS PCT: 7 % — AB (ref 12–46)
MONO ABS: 2.6 10*3/uL — AB (ref 0.1–1.0)
Monocytes Relative: 14 % — ABNORMAL HIGH (ref 3–12)
Neutro Abs: 14.4 10*3/uL — ABNORMAL HIGH (ref 1.7–7.7)
Neutrophils Relative %: 79 % — ABNORMAL HIGH (ref 43–77)

## 2014-09-14 LAB — COMPREHENSIVE METABOLIC PANEL
ALT: 16 U/L — ABNORMAL LOW (ref 17–63)
ANION GAP: 9 (ref 5–15)
AST: 20 U/L (ref 15–41)
Albumin: 1.8 g/dL — ABNORMAL LOW (ref 3.5–5.0)
Alkaline Phosphatase: 133 U/L — ABNORMAL HIGH (ref 38–126)
BUN: 16 mg/dL (ref 6–20)
CO2: 26 mmol/L (ref 22–32)
Calcium: 7.9 mg/dL — ABNORMAL LOW (ref 8.9–10.3)
Chloride: 95 mmol/L — ABNORMAL LOW (ref 101–111)
Creatinine, Ser: 0.64 mg/dL (ref 0.61–1.24)
GLUCOSE: 133 mg/dL — AB (ref 65–99)
POTASSIUM: 4.2 mmol/L (ref 3.5–5.1)
SODIUM: 130 mmol/L — AB (ref 135–145)
Total Bilirubin: 0.6 mg/dL (ref 0.3–1.2)
Total Protein: 5.4 g/dL — ABNORMAL LOW (ref 6.5–8.1)

## 2014-09-14 LAB — GLUCOSE, CAPILLARY
GLUCOSE-CAPILLARY: 115 mg/dL — AB (ref 65–99)
GLUCOSE-CAPILLARY: 141 mg/dL — AB (ref 65–99)
Glucose-Capillary: 121 mg/dL — ABNORMAL HIGH (ref 65–99)
Glucose-Capillary: 123 mg/dL — ABNORMAL HIGH (ref 65–99)
Glucose-Capillary: 137 mg/dL — ABNORMAL HIGH (ref 65–99)

## 2014-09-14 LAB — CBC
HEMATOCRIT: 25.4 % — AB (ref 39.0–52.0)
HEMOGLOBIN: 8 g/dL — AB (ref 13.0–17.0)
MCH: 27.2 pg (ref 26.0–34.0)
MCHC: 31.5 g/dL (ref 30.0–36.0)
MCV: 86.4 fL (ref 78.0–100.0)
PLATELETS: 302 10*3/uL (ref 150–400)
RBC: 2.94 MIL/uL — AB (ref 4.22–5.81)
RDW: 18.8 % — ABNORMAL HIGH (ref 11.5–15.5)
WBC: 18.5 10*3/uL — AB (ref 4.0–10.5)

## 2014-09-14 LAB — TRIGLYCERIDES: Triglycerides: 44 mg/dL (ref <150)

## 2014-09-14 LAB — MAGNESIUM: Magnesium: 1.9 mg/dL (ref 1.7–2.4)

## 2014-09-14 LAB — PHOSPHORUS: Phosphorus: 3.1 mg/dL (ref 2.5–4.6)

## 2014-09-14 LAB — PREALBUMIN: Prealbumin: 4.7 mg/dL — ABNORMAL LOW (ref 18–38)

## 2014-09-14 MED ORDER — FAT EMULSION 20 % IV EMUL
240.0000 mL | INTRAVENOUS | Status: AC
  Administered 2014-09-14: 240 mL via INTRAVENOUS
  Filled 2014-09-14: qty 250

## 2014-09-14 MED ORDER — TRACE MINERALS CR-CU-MN-SE-ZN 10-1000-500-60 MCG/ML IV SOLN
INTRAVENOUS | Status: AC
  Administered 2014-09-14: 17:00:00 via INTRAVENOUS
  Filled 2014-09-14: qty 2400

## 2014-09-14 NOTE — Progress Notes (Signed)
5 Days Post-Op  Subjective: Patient is alert and comfortable. Says he's passed lots of flatus and stool. He wants to have the NG tube removed. The NG has been clamped since yesterday morning except for it was on suction for 3 hours overnight at his request. It only drained 75 mL. He understands that if we remove the NG tube there is a possibility he could become distended and require reinsertion. He is comfortable with that. He is ambulating. He is voiding without difficulty.  Lab work shows potassium 4.2. Creatinine 0.64. Glucose 133. WBC 18,500. Hemoglobin 8.0. Albumin 1.8.  Objective: Vital signs in last 24 hours: Temp:  [98.3 F (36.8 C)-98.9 F (37.2 C)] 98.9 F (37.2 C) (05/23 0527) Pulse Rate:  [106-116] 106 (05/23 0527) Resp:  [16-20] 16 (05/23 0527) BP: (123-146)/(74-79) 146/79 mmHg (05/23 0527) SpO2:  [96 %-98 %] 97 % (05/23 0527) Last BM Date: 09/13/14  Intake/Output from previous day: 05/22 0701 - 05/23 0700 In: -  Out: 1400 [Urine:1325; Emesis/NG output:75] Intake/Output this shift: Total I/O In: -  Out: 650 [Urine:575; Emesis/NG output:75]  General appearance: Alert. Oriented. In no distress. Cooperative. Somewhat deconditioned. GI: Moderate distention. Minimal tenderness. Hypoactive bowel sounds. Incision clean and dry. Intact.  Lab Results:  Results for orders placed or performed during the hospital encounter of 09/06/14 (from the past 24 hour(s))  Glucose, capillary     Status: Abnormal   Collection Time: 09/13/14  8:38 AM  Result Value Ref Range   Glucose-Capillary 127 (H) 65 - 99 mg/dL  Glucose, capillary     Status: Abnormal   Collection Time: 09/14/14 12:03 AM  Result Value Ref Range   Glucose-Capillary 137 (H) 65 - 99 mg/dL   Comment 1 Notify RN   Comprehensive metabolic panel     Status: Abnormal   Collection Time: 09/14/14  6:00 AM  Result Value Ref Range   Sodium 130 (L) 135 - 145 mmol/L   Potassium 4.2 3.5 - 5.1 mmol/L   Chloride 95 (L) 101 -  111 mmol/L   CO2 26 22 - 32 mmol/L   Glucose, Bld 133 (H) 65 - 99 mg/dL   BUN 16 6 - 20 mg/dL   Creatinine, Ser 0.64 0.61 - 1.24 mg/dL   Calcium 7.9 (L) 8.9 - 10.3 mg/dL   Total Protein 5.4 (L) 6.5 - 8.1 g/dL   Albumin 1.8 (L) 3.5 - 5.0 g/dL   AST 20 15 - 41 U/L   ALT 16 (L) 17 - 63 U/L   Alkaline Phosphatase 133 (H) 38 - 126 U/L   Total Bilirubin 0.6 0.3 - 1.2 mg/dL   GFR calc non Af Amer >60 >60 mL/min   GFR calc Af Amer >60 >60 mL/min   Anion gap 9 5 - 15  Magnesium     Status: None   Collection Time: 09/14/14  6:00 AM  Result Value Ref Range   Magnesium 1.9 1.7 - 2.4 mg/dL  Phosphorus     Status: None   Collection Time: 09/14/14  6:00 AM  Result Value Ref Range   Phosphorus 3.1 2.5 - 4.6 mg/dL  CBC     Status: Abnormal   Collection Time: 09/14/14  6:00 AM  Result Value Ref Range   WBC 18.5 (H) 4.0 - 10.5 K/uL   RBC 2.94 (L) 4.22 - 5.81 MIL/uL   Hemoglobin 8.0 (L) 13.0 - 17.0 g/dL   HCT 25.4 (L) 39.0 - 52.0 %   MCV 86.4 78.0 - 100.0 fL  MCH 27.2 26.0 - 34.0 pg   MCHC 31.5 30.0 - 36.0 g/dL   RDW 18.8 (H) 11.5 - 15.5 %   Platelets 302 150 - 400 K/uL  Differential     Status: Abnormal   Collection Time: 09/14/14  6:00 AM  Result Value Ref Range   Neutrophils Relative % 79 (H) 43 - 77 %   Neutro Abs 14.4 (H) 1.7 - 7.7 K/uL   Lymphocytes Relative 7 (L) 12 - 46 %   Lymphs Abs 1.4 0.7 - 4.0 K/uL   Monocytes Relative 14 (H) 3 - 12 %   Monocytes Absolute 2.6 (H) 0.1 - 1.0 K/uL   Eosinophils Relative 0 0 - 5 %   Eosinophils Absolute 0.0 0.0 - 0.7 K/uL   Basophils Relative 0 0 - 1 %   Basophils Absolute 0.0 0.0 - 0.1 K/uL     Studies/Results: No results found.  . enoxaparin (LOVENOX) injection  40 mg Subcutaneous Q24H  . famotidine (PEPCID) IV  20 mg Intravenous Q12H  . insulin aspart  0-9 Units Subcutaneous Q8H     Assessment/Plan: s/p Procedure(s): EXPLORATORY LAPAROTOMY resection terminal ileum and right colon SMALL BOWEL bypass COLON RESECTION RIGHT  POD  #5. Continues gradual improvement and resolution of ileus. Discontinue NG. Nothing by mouth except ice chips Continue TNA for moderate to severe protein calorie mild malnutrition.  Malignant SBO. 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 Status post FOLFOX  Chemotherapy. Dr. Malachy Mood managing medical oncology issues.  Leukocytosis. Significance unclear. Clinically not toxic. No evidence of wound infection. No pulmonary symptoms. Monitor.  DVT prophylaxis. On Lovenox.    @PROBHOSP @  LOS: 8 days    Wesley Dillon 09/14/2014  . .prob

## 2014-09-14 NOTE — Progress Notes (Signed)
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 He was currently admitted for abdominal distention and high grade SBO. Status post surgery on 5/18.  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 surgery following - Initially treated with bowel rest, NG tube and IV fluids but had persistent high-grade obstruction - Status post exploratory laparotomy, resection of terminal ileum and right colon and small bowel bypass on 5/18 - Management per surgery - Continue TNA - Resolving postop ileus. NGT discontinued 5/23 and started on ice chips.  Iron deficiency anemia - Status post 2 units PRBCs intraoperatively 5/18. Hemoglobin improved from 8.1 > 11.1 posttransfusion but gradually dropped to the 8 g range and stable for the last 3 days  Leukocytosis - Possibly related to Neulasta and surgical stress - Admitted with WBC count of 27K which had decreased to 22K on 5/18 but again up to 29 - Fluctuating leukocytosis of unclear etiology.  Hypokalemia - replace as needed  Severe malnutrition in context of chronic illness -Management per dietitian. Currently on TNA   Nonproductive cough - Possibly from NG tube irritation - In the absence of fever or productive cough, pneumonia felt less likely. Patient has had leukocytosis for a while - CT abdomen 5/17 showed small bilateral pleural  effusion and atelectasis - Treat symptomatically with Robitussin-DM, incentive spirometry and monitor. If cough worsens or gallops fever, dyspnea and then will obtain chest x-ray to further evaluate. Discussed with patient and spouse and they are agreeable to this plan. - Resolved  DVT prophylaxis: Lovenox Code Status: Full Family Communication: Discussed with spouse at bedside Disposition Plan: DC home when medically stable-not medically stable for discharge.   Consultants:  [facility]  Procedures:  NGT  Exploratory laparotomy, resection of terminal ileum and right colon and small bowel bypass on 5/18  Antibiotics:  Cefoxitin 5/18   Subjective: Patient had 4 BMs this morning. Feels better. NG tube out. No cough or dyspnea. Ambulating halls. It's their 58 th wedding anniversary!  Objective: Filed Vitals:   09/13/14 1422 09/13/14 2051 09/14/14 0527 09/14/14 1400  BP: 123/76 125/74 146/79 130/72  Pulse: 116 106 106 100  Temp: 98.3 F (36.8 C) 98.8 F (37.1 C) 98.9 F (37.2 C) 98 F (36.7 C)  TempSrc: Oral Oral Oral Oral  Resp: 16 20 16 18   Height:      Weight:      SpO2: 98% 96% 97% 97%    Intake/Output Summary (Last 24 hours) at 09/14/14 1548 Last data filed at 09/14/14 9295  Gross per 24 hour  Intake      0 ml  Output    900 ml  Net   -900 ml   Filed Weights   09/06/14 1212  Weight: 77.52 kg (170 lb 14.4 oz)     Exam:  General exam: Moderately built and thinly nourished pleasant middle-aged mseen ambulating comfortably in the halls with his wife. Respiratory system: Diminished breath sounds in the bases. Rest of lung fields clear  to auscultation. No increased work of breathing. Cardiovascular system: S1 & S2 heard, RRR. No JVD, murmurs, gallops, clicks or pedal edema. Gastrointestinal system: Abdomen is  mildly distended, soft and nontender. Improving bowel sounds. Laparotomy site dressing clean and dry. Central nervous system: Alert and  oriented. No focal neurological deficits. Extremities: Symmetric 5 x 5 power.   Data Reviewed: Basic Metabolic Panel:  Recent Labs Lab 09/08/14 0445 09/09/14 0400 09/09/14 1313 09/10/14 0355 09/11/14 0430 09/12/14 0403 09/13/14 0411 09/14/14 0600  NA 134* 136 132* 136 136 132*  --  130*  K 3.4* 3.5 4.0 4.6 4.5 4.1  --  4.2  CL 101 103  --  105 103 99*  --  95*  CO2 25 25  --  24 26 26   --  26  GLUCOSE 135* 128*  --  162* 128* 131*  --  133*  BUN 13 8  --  11 12 13   --  16  CREATININE 0.78 0.58*  --  0.72 0.63 0.49* 0.46* 0.64  CALCIUM 7.9* 7.9*  --  7.8* 7.8* 7.6*  --  7.9*  MG 1.8 1.8  --  1.7 2.2  --   --  1.9  PHOS 1.8* 2.6  --  2.7  --   --   --  3.1   Liver Function Tests:  Recent Labs Lab 09/08/14 0445 09/10/14 0355 09/14/14 0600  AST 24 20 20   ALT 14* 12* 16*  ALKPHOS 102 85 133*  BILITOT 0.5 0.4 0.6  PROT 5.4* 4.8* 5.4*  ALBUMIN 2.6* 2.1* 1.8*   No results for input(s): LIPASE, AMYLASE in the last 168 hours. No results for input(s): AMMONIA in the last 168 hours. CBC:  Recent Labs Lab 09/08/14 0445  09/10/14 0355 09/11/14 0430 09/12/14 0403 09/13/14 0411 09/14/14 0600  WBC 22.6*  --  29.2* 19.4* 18.1* 15.9* 18.5*  NEUTROABS 18.3*  --   --   --   --   --  14.4*  HGB 8.1*  < > 11.1* 9.2* 8.2* 8.6* 8.0*  HCT 25.5*  < > 34.3* 28.3* 25.4* 26.5* 25.4*  MCV 87.9  --  86.2 87.3 87.0 86.6 86.4  PLT 185  --  196 175 188 280 302  < > = values in this interval not displayed. Cardiac Enzymes: No results for input(s): CKTOTAL, CKMB, CKMBINDEX, TROPONINI in the last 168 hours. BNP (last 3 results) No results for input(s): PROBNP in the last 8760 hours. CBG:  Recent Labs Lab 09/12/14 1656 09/12/14 2349 09/13/14 0838 09/14/14 0003 09/14/14 0825  GLUCAP 140* 132* 127* 137* 141*    Recent Results (from the past 240 hour(s))  Surgical pcr screen     Status: None   Collection Time: 09/08/14 10:02 PM  Result Value Ref Range Status   MRSA, PCR  NEGATIVE NEGATIVE Final   Staphylococcus aureus NEGATIVE NEGATIVE Final    Comment:        The Xpert SA Assay (FDA approved for NASAL specimens in patients over 45 years of age), is one component of a comprehensive surveillance program.  Test performance has been validated by [facility] for patients greater than or equal to 65 year old. It is not intended to diagnose infection nor to guide or monitor treatment.          Studies: No results found.      Scheduled Meds: . enoxaparin (LOVENOX) injection  40 mg Subcutaneous Q24H  . famotidine (PEPCID) IV  20 mg Intravenous Q12H  .  insulin aspart  0-9 Units Subcutaneous Q8H   Continuous Infusions: . 0.9 % NaCl with KCl 20 mEq / L 10 mL/hr at 09/13/14 2126  . Marland KitchenTPN (CLINIMIX-E) Adult 100 mL/hr at 09/13/14 1739   And  . fat emulsion 240 mL (09/13/14 1739)  . Marland KitchenTPN (CLINIMIX-E) Adult     And  . fat emulsion      Active Problems:   Small bowel obstruction    Time spent: 20 minutes.    Vernell Leep, MD, FACP, FHM. [facility] Pager [phone]  If 7PM-7AM, please contact night-coverage [email or web address] Password [facility] 09/14/2014, 3:48 PM    LOS: 8 days

## 2014-09-14 NOTE — Progress Notes (Signed)
Patient was up to the bathroom with assistance, and reports a large, watery BM with gas at this time. He is requesting the NG be clamped again, which was done. NG output from 2130 to now was 75 cc's. He denies any pain or nausea at this time.

## 2014-09-14 NOTE — Progress Notes (Signed)
Nutrition Follow-up  DOCUMENTATION CODES:  Severe malnutrition in context of chronic illness  INTERVENTION:  TPN per pharmacy RD to continue to monitor  NUTRITION DIAGNOSIS:  Malnutrition related to chronic illness as evidenced by percent weight loss, severe depletion of body fat, severe depletion of muscle mass.  Ongoing.  GOAL:  Patient will meet greater than or equal to 90% of their needs  Meeting with TPN  MONITOR:  Labs, Weight trends, Skin, I & O's, Other (Comment) (TPN tolerance)  REASON FOR ASSESSMENT:  Consult New TPN/TNA  ASSESSMENT: 69 yo male w/ relatively new dx of abdominal carcinomatosis that is being treated by Dr Learta Codding w/ FOLFOX therapy, next dose due Tuesday . He presents to the ED after having developed intolerance to PO w/ nausea and emesis in association w/ his baseline of abdominal distension and a few recent admissions for SBO.  S/p 5/18 Procedure(s): EXPLORATORY LAPAROTOMY resection terminal ileum and right colon, SMALL BOWEL bypass  5/16: Pt reports eating lunch and dinner on 5/14 with no issues. Pt is following a soft diet at home. Pt states N/V started on 5/15.  Pt currently NPO d/t bowel rest for SBO, TPN initiated 5/17.  Pt with 11 lb of weight loss since 4/19 (6% weight loss x 1 month, siginificant for time frame).  5/20: -Per surgery, pt had some nausea last night. No N/V today.  -Pt walking in the halls today. No BM yet. -Pt still with NGT for suction -TPN will advance to goal tonight.  5/23: -Pt reports feeling much better with BM today. -Pt with eating ice chips during visit. NGT removed. -If diet is advanced, pt does not want nutritional supplements. RD to follow-up once diet is advanced to address supplement options.  Labs reviewed: Low Na CBGs: 127-141  Plan per Pharmacy (5/23): At 1800 today:  Continue Clinimix E 5/15 at goal rate of 100 ml/hr.  Cont 20% fat emulsion at 54ml/hr  Goal: Clinimix E 5/15 at a  goal rate of 100 ml/hr + 20% fat emulsion at 10 ml/hr to provide: 120 g/day protein, 2180 Kcal/day.  Height:  Ht Readings from Last 1 Encounters:  09/06/14 6\' 1"  (1.854 m)    Weight:  Wt Readings from Last 1 Encounters:  09/06/14 170 lb 14.4 oz (77.52 kg)    Ideal Body Weight:  83.6 kg  Wt Readings from Last 10 Encounters:  09/06/14 170 lb 14.4 oz (77.52 kg)  08/25/14 179 lb 11.2 oz (81.511 kg)  08/18/14 173 lb (78.472 kg)  08/11/14 181 lb 12.8 oz (82.464 kg)  07/28/14 181 lb 12.8 oz (82.464 kg)  07/08/14 183 lb 11.2 oz (83.326 kg)  07/02/14 191 lb 2.2 oz (86.7 kg)  04/09/14 191 lb (86.637 kg)  12/16/13 183 lb 3.2 oz (83.099 kg)  11/03/13 183 lb 6.4 oz (83.19 kg)    BMI:  Body mass index is 22.55 kg/(m^2).  Estimated Nutritional Needs:  Kcal:  2100-2300  Protein:  110-120g  Fluid:  2.1L/day    Skin:  Reviewed, no issues  Diet Order:  Diet NPO time specified Except for: Ice Chips TPN (CLINIMIX-E) Adult TPN (CLINIMIX-E) Adult  EDUCATION NEEDS:  No education needs identified at this time   Intake/Output Summary (Last 24 hours) at 09/14/14 1411 Last data filed at 09/14/14 0529  Gross per 24 hour  Intake      0 ml  Output    900 ml  Net   -900 ml    Last BM:  5/23  Mendel Ryder  Luana Shu, MS, RD, LDN Pager: [phone] After Hours Pager: [phone]

## 2014-09-14 NOTE — Progress Notes (Signed)
PARENTERAL NUTRITION CONSULT NOTE - Follow up  Pharmacy Consult for TPN Indication: SBO/ abdominal carcinomatosis  No Known Allergies  Patient Measurements: Height: 6' 1"  (185.4 cm) Weight: 170 lb 14.4 oz (77.52 kg) IBW/kg (Calculated) : 79.9  Vital Signs: Temp: 98.9 F (37.2 C) (05/23 0527) Temp Source: Oral (05/23 0527) BP: 146/79 mmHg (05/23 0527) Pulse Rate: 106 (05/23 0527) Intake/Output from previous day: 05/22 0701 - 05/23 0700 In: -  Out: 1400 [Urine:1325; Emesis/NG output:75] Intake/Output from this shift:    Labs:  Recent Labs  09/12/14 0403 09/13/14 0411 09/14/14 0600  WBC 18.1* 15.9* 18.5*  HGB 8.2* 8.6* 8.0*  HCT 25.4* 26.5* 25.4*  PLT 188 280 302    Recent Labs  09/12/14 0403 09/13/14 0411 09/14/14 0600  NA 132*  --  130*  K 4.1  --  4.2  CL 99*  --  95*  CO2 26  --  26  GLUCOSE 131*  --  133*  BUN 13  --  16  CREATININE 0.49* 0.46* 0.64  CALCIUM 7.6*  --  7.9*  MG  --   --  1.9  PHOS  --   --  3.1  PROT  --   --  5.4*  ALBUMIN  --   --  1.8*  AST  --   --  20  ALT  --   --  16*  ALKPHOS  --   --  133*  BILITOT  --   --  0.6   Estimated Creatinine Clearance: 98.2 mL/min (by C-G formula based on Cr of 0.64).    Recent Labs  09/12/14 2349 09/13/14 0838 09/14/14 0003  GLUCAP 132* 127* 137*    Medical History: Past Medical History  Diagnosis Date  . Tremor     takes Primidone 250 once daily and inderall 160 daily for this-has had it since he was 21  . Pneumonia 06/2012  . Macular degeneration   . Benign essential tremor   . Coronary artery disease   . Anemia 06/30/2014  . 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 Essential hypertension 08/18/2014   Insulin Requirements: 3 units  Current Nutrition: NPO  IVF: NS with 20 mEq KCl at 10 ml/hr  Central access: use PAC TPN start date: 5/16  ASSESSMENT                                                                                                          HPI: 89 yoM with  Gastric Ca, abd carcinomatosis, 3rd admit for SBO this year. Required TPN last admit 4/26-5/2. NG tube place, surgery consulted - Considering palliative surgery. Begin TPN today 5/16, use PAC for central access  Significant events:  5/16: K low, 4 runs KCl 10 mEq 5/17: K & Phos low, K Phos 67mol bolus, plan abd CT 5/18: To OR for lap resection of obstructing mass. 5/23: NGT d/c'd.  Passing flatus and stool.    Today:   Glucose - CBG's at goal (< 150)   Electrolytes - NA remains low, Corr Ca 9.7, all others WNL  Renal - wnl  LFTs - wnl, alk phos slightly elevated  TGs - 98 (5/17)  Prealbumin - 7.1 (5/17), 4.7 (5/23)  NUTRITIONAL GOALS                                                                                             RD recs: 2100-2300Kcal, 110-120g, 2.1L per day. Clinimix E 5/15 at a goal rate of 100 ml/hr + 20% fat emulsion at 10 ml/hr to provide: 120 g/day protein, 2180 Kcal/day.  PLAN                                                                                                                         At 1800 today:  Continue Clinimix E 5/15 at goal rate of 100 ml/hr.    Cont 20% fat emulsion at 47m/hr  KVO IVF's  F/u tolerance at goal rate.  TPN to contain standard multivitamins and trace elements.  Cont Sensitive SSI q8h  TPN lab panels on Mondays & Thursdays.  F/u daily.  CRalene Bathe PharmD, BCPS 09/14/2014, 8:07 AM  Pager: [phone]

## 2014-09-15 DIAGNOSIS — D638 Anemia in other chronic diseases classified elsewhere: Secondary | ICD-10-CM

## 2014-09-15 LAB — CBC
HCT: 24.3 % — ABNORMAL LOW (ref 39.0–52.0)
Hemoglobin: 7.6 g/dL — ABNORMAL LOW (ref 13.0–17.0)
MCH: 27 pg (ref 26.0–34.0)
MCHC: 31.3 g/dL (ref 30.0–36.0)
MCV: 86.2 fL (ref 78.0–100.0)
Platelets: 333 10*3/uL (ref 150–400)
RBC: 2.82 MIL/uL — ABNORMAL LOW (ref 4.22–5.81)
RDW: 18.6 % — AB (ref 11.5–15.5)
WBC: 16.9 10*3/uL — AB (ref 4.0–10.5)

## 2014-09-15 LAB — GLUCOSE, CAPILLARY
GLUCOSE-CAPILLARY: 129 mg/dL — AB (ref 65–99)
GLUCOSE-CAPILLARY: 115 mg/dL — AB (ref 65–99)
GLUCOSE-CAPILLARY: 103 mg/dL — AB (ref 65–99)

## 2014-09-15 LAB — BASIC METABOLIC PANEL
Anion gap: 8 (ref 5–15)
BUN: 15 mg/dL (ref 6–20)
CO2: 25 mmol/L (ref 22–32)
CREATININE: 0.44 mg/dL — AB (ref 0.61–1.24)
Calcium: 8 mg/dL — ABNORMAL LOW (ref 8.9–10.3)
Chloride: 96 mmol/L — ABNORMAL LOW (ref 101–111)
GFR calc Af Amer: >60 mL/min (ref >60)
GFR calc non Af Amer: >60 mL/min (ref >60)
GLUCOSE: 126 mg/dL — AB (ref 65–99)
Potassium: 4 mmol/L (ref 3.5–5.1)
Sodium: 129 mmol/L — ABNORMAL LOW (ref 135–145)

## 2014-09-15 LAB — CLOSTRIDIUM DIFFICILE BY PCR: CDIFFPCR: NEGATIVE

## 2014-09-15 MED ORDER — SODIUM CHLORIDE 0.9 % IV SOLN
INTRAVENOUS | Status: DC
  Administered 2014-09-15: 07:00:00 via INTRAVENOUS
  Filled 2014-09-15 (×2): qty 1000

## 2014-09-15 MED ORDER — ATORVASTATIN CALCIUM 10 MG PO TABS
10.0000 mg | ORAL_TABLET | Freq: Every morning | ORAL | Status: DC
  Administered 2014-09-15 – 2014-09-18 (×4): 10 mg via ORAL
  Filled 2014-09-15 (×4): qty 1

## 2014-09-15 MED ORDER — PRIMIDONE 250 MG PO TABS
250.0000 mg | ORAL_TABLET | Freq: Every morning | ORAL | Status: DC
  Administered 2014-09-15 – 2014-09-18 (×4): 250 mg via ORAL
  Filled 2014-09-15 (×4): qty 1

## 2014-09-15 MED ORDER — FAT EMULSION 20 % IV EMUL
240.0000 mL | INTRAVENOUS | Status: AC
  Administered 2014-09-15: 240 mL via INTRAVENOUS
  Filled 2014-09-15: qty 250

## 2014-09-15 MED ORDER — PROPRANOLOL HCL ER 60 MG PO CP24
60.0000 mg | ORAL_CAPSULE | Freq: Every day | ORAL | Status: DC
  Administered 2014-09-15 – 2014-09-18 (×4): 60 mg via ORAL
  Filled 2014-09-15 (×4): qty 1

## 2014-09-15 MED ORDER — TRACE MINERALS CR-CU-MN-SE-ZN 10-1000-500-60 MCG/ML IV SOLN
INTRAVENOUS | Status: AC
  Administered 2014-09-15: 18:00:00 via INTRAVENOUS
  Filled 2014-09-15: qty 2400

## 2014-09-15 MED ORDER — FAMOTIDINE 20 MG PO TABS
20.0000 mg | ORAL_TABLET | Freq: Two times a day (BID) | ORAL | Status: DC
  Administered 2014-09-15 – 2014-09-18 (×6): 20 mg via ORAL
  Filled 2014-09-15 (×8): qty 1

## 2014-09-15 MED ORDER — SODIUM CHLORIDE 0.9 % IV SOLN
INTRAVENOUS | Status: DC
  Administered 2014-09-15 – 2014-09-17 (×3): via INTRAVENOUS

## 2014-09-15 NOTE — Care Management Note (Signed)
Case Management Note  Patient Details  Name: Wesley Dillon MRN: [id number] Date of Birth: 1946-02-07  Subjective/Objective:   POD#6 exp lap,colon resection,p/o ileus,loose stools,hypoactive bs.clears,ivf@100 . D/c plan home.                 Action/Plan: Continue to monitor progress.D/C plan home.   Expected Discharge Date:                  Expected Discharge Plan:  Home/Self Care  In-House Referral:     Discharge planning Services  CM Consult  Post Acute Care Choice:    Choice offered to:     DME Arranged:    DME Agency:     HH Arranged:    HH Agency:     Status of Service:  In process, will continue to follow  Medicare Important Message Given:    Date Medicare IM Given:    Medicare IM give by:    Date Additional Medicare IM Given:    Additional Medicare Important Message give by:     If discussed at [place] of Stay Meetings, dates discussed: 09/15/14   Additional Comments:  Dessa Phi, RN 09/15/2014, 10:20 AM

## 2014-09-15 NOTE — Progress Notes (Signed)
Pt stated that he has had several watery stools last evening and tonight. Stool sent to lab for possible c-diff. Will cont to monitor patient.

## 2014-09-15 NOTE — Progress Notes (Signed)
6 Days Post-Op  Subjective: Complains of numerous loose stools. Says he feels dehydrated. C. difficile checked and negative. Denies nausea. Says he feels his appetite returning. Not much pain. Ambulatory and no dizziness or lightheadedness. Hemoglobin 7.6. WBC 16,900. Potassium 4.0. BUN 15. Creatinine 0.44. Glucose 126.  Objective: Vital signs in last 24 hours: Temp:  [98 F (36.7 C)-99.3 F (37.4 C)] 98.1 F (36.7 C) (05/24 0545) Pulse Rate:  [99-112] 99 (05/24 0545) Resp:  [18] 18 (05/24 0545) BP: (119-130)/(70-76) 130/76 mmHg (05/24 0545) SpO2:  [97 %-98 %] 98 % (05/24 0545) Last BM Date: 09/14/14  Intake/Output from previous day: 05/23 0701 - 05/24 0700 In: 1693.5 [I.V.:125.3; TPN:1568.2] Out: 500 [Urine:500] Intake/Output this shift: Total I/O In: 1693.5 [I.V.:125.3; TPN:1568.2] Out: 500 [Urine:500]  General appearance: Alert. Oriented. Cooperative. No distress. HEENT: Sclera moist. Tongue moist. GI: Softer. Still distended but less. Midline incision clean and dry. Dressing changed. Hypoactive bowel sounds.  Lab Results:  Results for orders placed or performed during the hospital encounter of 09/06/14 (from the past 24 hour(s))  Glucose, capillary     Status: Abnormal   Collection Time: 09/14/14  8:25 AM  Result Value Ref Range   Glucose-Capillary 141 (H) 65 - 99 mg/dL   Comment 1 Notify RN    Comment 2 Document in Chart   Glucose, capillary     Status: Abnormal   Collection Time: 09/14/14  5:05 PM  Result Value Ref Range   Glucose-Capillary 121 (H) 65 - 99 mg/dL   Comment 1 Notify RN    Comment 2 Document in Chart   Glucose, capillary     Status: Abnormal   Collection Time: 09/14/14  8:30 PM  Result Value Ref Range   Glucose-Capillary 123 (H) 65 - 99 mg/dL  Glucose, capillary     Status: Abnormal   Collection Time: 09/14/14 11:57 PM  Result Value Ref Range   Glucose-Capillary 115 (H) 65 - 99 mg/dL  Clostridium Difficile by PCR     Status: None   Collection  Time: 09/15/14  3:03 AM  Result Value Ref Range   C difficile by pcr NEGATIVE NEGATIVE  CBC     Status: Abnormal   Collection Time: 09/15/14  3:57 AM  Result Value Ref Range   WBC 16.9 (H) 4.0 - 10.5 K/uL   RBC 2.82 (L) 4.22 - 5.81 MIL/uL   Hemoglobin 7.6 (L) 13.0 - 17.0 g/dL   HCT 24.3 (L) 39.0 - 52.0 %   MCV 86.2 78.0 - 100.0 fL   MCH 27.0 26.0 - 34.0 pg   MCHC 31.3 30.0 - 36.0 g/dL   RDW 18.6 (H) 11.5 - 15.5 %   Platelets 333 150 - 400 K/uL  Basic metabolic panel     Status: Abnormal   Collection Time: 09/15/14  3:57 AM  Result Value Ref Range   Sodium 129 (L) 135 - 145 mmol/L   Potassium 4.0 3.5 - 5.1 mmol/L   Chloride 96 (L) 101 - 111 mmol/L   CO2 25 22 - 32 mmol/L   Glucose, Bld 126 (H) 65 - 99 mg/dL   BUN 15 6 - 20 mg/dL   Creatinine, Ser 0.44 (L) 0.61 - 1.24 mg/dL   Calcium 8.0 (L) 8.9 - 10.3 mg/dL   GFR calc non Af Amer >60 >60 mL/min   GFR calc Af Amer >60 >60 mL/min   Anion gap 8 5 - 15     Studies/Results: No results found.  . enoxaparin (LOVENOX) injection  40 mg Subcutaneous Q24H  . famotidine (PEPCID) IV  20 mg Intravenous Q12H  . insulin aspart  0-9 Units Subcutaneous Q8H     Assessment/Plan: s/p Procedure(s): EXPLORATORY LAPAROTOMY resection terminal ileum and right colon SMALL BOWEL bypass COLON RESECTION RIGHT  POD #6. Continues gradual improvement and resolution of ileus. Begin clear liquids Continue TNA for moderate to severe protein calorie mild malnutrition.  Diarrhea. Probably physiologic. C. difficile PCR negative. We will add IV saline at 100 mL per hour for the next 24 hours in addition to TNA to avoid dehydration. Would not treat diarrhea with antimotility drugs just yet.  Malignant SBO. 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 Status post FOLFOX Chemotherapy. Dr. [name] managing medical oncology issues.  Leukocytosis. Significance unclear. Clinically not toxic. No evidence of wound infection. No  pulmonary symptoms. Monitor.  DVT prophylaxis. On Lovenox.  @PROBHOSP @  LOS: 9 days    Taniesha Glanz M 09/15/2014  . .prob

## 2014-09-15 NOTE — Progress Notes (Signed)
Pt has tested negative for c-diff. Will cont to monitor patient.

## 2014-09-15 NOTE — Progress Notes (Addendum)
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 He was currently admitted for abdominal distention and high grade SBO. Status post surgery on 5/18.  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 surgery following - Initially treated with bowel rest, NG tube and IV fluids but had persistent high-grade obstruction - Status post exploratory laparotomy, resection of terminal ileum and right colon and small bowel bypass on 5/18 - Management per surgery - Continue TNA - Postop ileus resolved. NGT discontinued 5/23 and started on ice chips. Gradually advancing diet - Now with diarrhea which is probably physiologic (C. difficile PCR negative). Gradually advancing diet.  Iron deficiency anemia - Status post 2 units PRBCs intraoperatively 5/18. Hemoglobin improved from 8.1 > 11.1 posttransfusion but gradually dropped to the 8 g range and today 7.6 - Discussed with patient regarding options of transfusing 1 unit PRBCs today versus following CBC in a.m. and transfuse if hemoglobin drops any further. Patient wishes to hold off on transfusing until follow-up in a.m.  Leukocytosis - Possibly related to Neulasta and surgical stress - Admitted with WBC count of 27K which had decreased to 22K on 5/18 but again up to 29 - Fluctuating leukocytosis of unclear etiology. - C. difficile PCR  negative.  Hypokalemia - replaced  Severe malnutrition in context of chronic illness -Management per dietitian. Currently on TNA   Nonproductive cough - Possibly from NG tube irritation - In the absence of fever or productive cough, pneumonia felt less likely. Patient has had leukocytosis for a while - CT abdomen 5/17 showed small bilateral pleural effusion and atelectasis - Treat symptomatically with Robitussin-DM, incentive spirometry and monitor. If cough worsens or gallops fever, dyspnea and then will obtain chest x-ray to further evaluate. Discussed with patient and spouse and they are agreeable to this plan. - Resolved  Diarrhea  - Probably physiologic related to prolonged postop ileus - C. difficile PCR negative - Agree with holding off on antimotility drugs. No antibiotics at this time either - Started on IV fluid hydration in addition to TNA, by surgery. Monitor for volume overload.  Hyponatremia - Probably related to GI losses and some dehydration. IV normal saline and follow. Stable  Benign essential tremor - Resume primidone and Inderal at lower than home dose.  Essential hypertension - Mild periodic elevations - Resume Inderal at lower than home dose but hold ARB - Monitor  DVT prophylaxis: Lovenox Code Status: Full Family Communication: Discussed with spouse at bedside on a daily basis including today. Disposition Plan: DC home when medically stable-not medically stable for discharge. May be able to go home towards the end of this week.   Consultants:  [facility]  Medical Oncology  Procedures:  NGT-discontinued  Exploratory laparotomy, resection of terminal ileum and right colon and small bowel bypass on 5/18  Antibiotics:  Cefoxitin 5/18   Subjective: Several diarrheal episodes almost every 2 hours. Watery. No significant abdominal pain. No nausea or vomiting or fever.  Objective: Filed Vitals:   09/14/14 0527 09/14/14  1400 09/14/14 2032 09/15/14  0545  BP: 146/79 130/72 119/70 130/76  Pulse: 106 100 112 99  Temp: 98.9 F (37.2 C) 98 F (36.7 C) 99.3 F (37.4 C) 98.1 F (36.7 C)  TempSrc: Oral Oral Oral Oral  Resp: 16 18 18 18   Height:      Weight:      SpO2: 97% 97%  98%    Intake/Output Summary (Last 24 hours) at 09/15/14 1128 Last data filed at 09/15/14 0998  Gross per 24 hour  Intake 1933.49 ml  Output    500 ml  Net 1433.49 ml   Filed Weights   09/06/14 1212  Weight: 77.52 kg (170 lb 14.4 oz)     Exam:  General exam: Moderately built and thinly nourished pleasant middle-aged male seen ambulating comfortably in the halls with his wife. Respiratory system: Diminished breath sounds in the bases. Rest of lung fields clear to auscultation. No increased work of breathing. Cardiovascular system: S1 & S2 heard, RRR. No JVD, murmurs, gallops, clicks or pedal edema. Gastrointestinal system: Abdomen is  mildly distended, soft and nontender.Normal bowel sounds. Laparotomy site dressing clean and dry. Central nervous system: Alert and oriented. No focal neurological deficits. Extremities: Symmetric 5 x 5 power.   Data Reviewed: Basic Metabolic Panel:  Recent Labs Lab 09/09/14 0400  09/10/14 0355 09/11/14 0430 09/12/14 0403 09/13/14 0411 09/14/14 0600 09/15/14 0357  NA 136  < > 136 136 132*  --  130* 129*  K 3.5  < > 4.6 4.5 4.1  --  4.2 4.0  CL 103  --  105 103 99*  --  95* 96*  CO2 25  --  24 26 26   --  26 25  GLUCOSE 128*  --  162* 128* 131*  --  133* 126*  BUN 8  --  11 12 13   --  16 15  CREATININE 0.58*  --  0.72 0.63 0.49* 0.46* 0.64 0.44*  CALCIUM 7.9*  --  7.8* 7.8* 7.6*  --  7.9* 8.0*  MG 1.8  --  1.7 2.2  --   --  1.9  --   PHOS 2.6  --  2.7  --   --   --  3.1  --   < > = values in this interval not displayed. Liver Function Tests:  Recent Labs Lab 09/10/14 0355 09/14/14 0600  AST 20 20  ALT 12* 16*  ALKPHOS 85 133*  BILITOT 0.4 0.6  PROT 4.8* 5.4*  ALBUMIN 2.1* 1.8*   No results for  input(s): LIPASE, AMYLASE in the last 168 hours. No results for input(s): AMMONIA in the last 168 hours. CBC:  Recent Labs Lab 09/11/14 0430 09/12/14 0403 09/13/14 0411 09/14/14 0600 09/15/14 0357  WBC 19.4* 18.1* 15.9* 18.5* 16.9*  NEUTROABS  --   --   --  14.4*  --   HGB 9.2* 8.2* 8.6* 8.0* 7.6*  HCT 28.3* 25.4* 26.5* 25.4* 24.3*  MCV 87.3 87.0 86.6 86.4 86.2  PLT 175 188 280 302 333   Cardiac Enzymes: No results for input(s): CKTOTAL, CKMB, CKMBINDEX, TROPONINI in the last 168 hours. BNP (last 3 results) No results for input(s): PROBNP in the last 8760 hours. CBG:  Recent Labs Lab 09/14/14 0825 09/14/14 1705 09/14/14 2030 09/14/14 2357 09/15/14 0740  GLUCAP 141* 121* 123* 115* 115*    Recent Results (from the past 240 hour(s))  Surgical pcr screen     Status: None   Collection Time: 09/08/14 10:02  PM  Result Value Ref Range Status   MRSA, PCR NEGATIVE NEGATIVE Final   Staphylococcus aureus NEGATIVE NEGATIVE Final    Comment:        The Xpert SA Assay (FDA approved for NASAL specimens in patients over 16 years of age), is one component of a comprehensive surveillance program.  Test performance has been validated by [facility] for patients greater than or equal to 77 year old. It is not intended to diagnose infection nor to guide or monitor treatment.   Clostridium Difficile by PCR     Status: None   Collection Time: 09/15/14  3:03 AM  Result Value Ref Range Status   C difficile by pcr NEGATIVE NEGATIVE Final         Studies: No results found.      Scheduled Meds: . enoxaparin (LOVENOX) injection  40 mg Subcutaneous Q24H  . famotidine (PEPCID) IV  20 mg Intravenous Q12H  . insulin aspart  0-9 Units Subcutaneous Q8H   Continuous Infusions: . 0.9 % NaCl with KCl 20 mEq / L 10 mL/hr at 09/13/14 2126  . Marland KitchenTPN (CLINIMIX-E) Adult 100 mL/hr at 09/14/14 1710   And  . fat emulsion 240 mL (09/14/14 1711)  . Marland KitchenTPN (CLINIMIX-E) Adult     And  .  fat emulsion    . sodium chloride 0.9 % 1,000 mL infusion 100 mL/hr at 09/15/14 6767    Active Problems:   Small bowel obstruction    Time spent: 20 minutes.    Vernell Leep, MD, FACP, FHM. [facility] Pager [phone]  If 7PM-7AM, please contact night-coverage [email or web address] Password [email or web address] 09/15/2014, 11:28 AM    LOS: 9 days

## 2014-09-15 NOTE — Progress Notes (Signed)
PARENTERAL NUTRITION CONSULT NOTE - Follow up  Pharmacy Consult for TPN Indication: SBO/ abdominal carcinomatosis  No Known Allergies  Patient Measurements: Height: 6' 1"  (185.4 cm) Weight: 170 lb 14.4 oz (77.52 kg) IBW/kg (Calculated) : 79.9  Vital Signs: Temp: 98.1 F (36.7 C) (05/24 0545) Temp Source: Oral (05/24 0545) BP: 130/76 mmHg (05/24 0545) Pulse Rate: 99 (05/24 0545) Intake/Output from previous day: 05/23 0701 - 05/24 0700 In: 1693.5 [I.V.:125.3; PXT:0626.9] Out: 500 [Urine:500] Intake/Output from this shift:    Labs:  Recent Labs  09/13/14 0411 09/14/14 0600 09/15/14 0357  WBC 15.9* 18.5* 16.9*  HGB 8.6* 8.0* 7.6*  HCT 26.5* 25.4* 24.3*  PLT 280 302 333    Recent Labs  09/13/14 0411 09/14/14 0600 09/15/14 0357  NA  --  130* 129*  K  --  4.2 4.0  CL  --  95* 96*  CO2  --  26 25  GLUCOSE  --  133* 126*  BUN  --  16 15  CREATININE 0.46* 0.64 0.44*  CALCIUM  --  7.9* 8.0*  MG  --  1.9  --   PHOS  --  3.1  --   PROT  --  5.4*  --   ALBUMIN  --  1.8*  --   AST  --  20  --   ALT  --  16*  --   ALKPHOS  --  133*  --   BILITOT  --  0.6  --   PREALBUMIN  --  4.7*  --   TRIG  --  44  --    Estimated Creatinine Clearance: 98.2 mL/min (by C-G formula based on Cr of 0.44).    Recent Labs  09/14/14 2030 09/14/14 2357 09/15/14 0740  GLUCAP 123* 115* 115*    Medical History: Past Medical History  Diagnosis Date  . Tremor     takes Primidone 250 once daily and inderall 160 daily for this-has had it since he was 21  . Pneumonia 06/2012  . Macular degeneration   . Benign essential tremor   . Coronary artery disease   . Anemia 06/30/2014  . 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 Essential hypertension 08/18/2014   Insulin Requirements: 3 units  Current Nutrition: NPO  IVF: NS with 20 mEq KCl at 10 ml/hr NS at 100 ml/hr  Central access: use PAC TPN start date: 5/16  ASSESSMENT                                                                                                           HPI: 9 yoM with Gastric Ca, abd carcinomatosis, 3rd admit for SBO this year. Required TPN last admit 4/26-5/2. NG tube place, surgery consulted - Considering palliative surgery. Begin TPN today 5/16, use PAC for central access  Significant events:  5/16: K low, 4 runs KCl 10 mEq 5/17: K & Phos low, K Phos 35mol bolus, plan abd CT 5/18: To OR for lap resection of obstructing mass. 5/23: NGT d/c'd.  Passing flatus and stool.   5/24: Complaints of  diarrhea. CDiff negative. Start CLD  Today:   Glucose - CBG's at goal (< 150)   Electrolytes - NA remains low, Corr Ca and other lytes WNL  Renal - wnl  LFTs - wnl, alk phos slightly elevated  TGs - 98 (5/17), 44 (5/23)  Prealbumin - 7.1 (5/17), 4.7 (5/23)  NUTRITIONAL GOALS                                                                                             RD recs: 2100-2300Kcal, 110-120g, 2.1L per day. Clinimix E 5/15 at a goal rate of 100 ml/hr + 20% fat emulsion at 10 ml/hr to provide: 120 g/day protein, 2180 Kcal/day.  PLAN                                                                                                                         At 1800 today:  Continue Clinimix E 5/15 at goal rate of 100 ml/hr.    Cont 20% fat emulsion at 74m/hr  KVO IVF's  F/u tolerance at goal rate.  TPN to contain standard multivitamins and trace elements.  Cont Sensitive SSI q8h  TPN lab panels on Mondays & Thursdays.  F/u daily.  CRalene Bathe PharmD, BCPS 09/15/2014, 8:45 AM  Pager: [phone]

## 2014-09-16 DIAGNOSIS — K5669 Other intestinal obstruction: Secondary | ICD-10-CM

## 2014-09-16 DIAGNOSIS — R197 Diarrhea, unspecified: Secondary | ICD-10-CM

## 2014-09-16 DIAGNOSIS — D72829 Elevated white blood cell count, unspecified: Secondary | ICD-10-CM

## 2014-09-16 DIAGNOSIS — E871 Hypo-osmolality and hyponatremia: Secondary | ICD-10-CM

## 2014-09-16 LAB — BASIC METABOLIC PANEL
ANION GAP: 7 (ref 5–15)
BUN: 13 mg/dL (ref 6–20)
CALCIUM: 7.7 mg/dL — AB (ref 8.9–10.3)
CO2: 24 mmol/L (ref 22–32)
Chloride: 96 mmol/L — ABNORMAL LOW (ref 101–111)
Creatinine, Ser: 0.61 mg/dL (ref 0.61–1.24)
GFR calc Af Amer: >60 mL/min (ref >60)
Glucose, Bld: 120 mg/dL — ABNORMAL HIGH (ref 65–99)
Potassium: 4.1 mmol/L (ref 3.5–5.1)
SODIUM: 127 mmol/L — AB (ref 135–145)

## 2014-09-16 LAB — CBC
HCT: 23.1 % — ABNORMAL LOW (ref 39.0–52.0)
Hemoglobin: 7.6 g/dL — ABNORMAL LOW (ref 13.0–17.0)
MCH: 28.4 pg (ref 26.0–34.0)
MCHC: 32.9 g/dL (ref 30.0–36.0)
MCV: 86.2 fL (ref 78.0–100.0)
Platelets: 380 10*3/uL (ref 150–400)
RBC: 2.68 MIL/uL — ABNORMAL LOW (ref 4.22–5.81)
RDW: 18.7 % — ABNORMAL HIGH (ref 11.5–15.5)
WBC: 14.6 10*3/uL — AB (ref 4.0–10.5)

## 2014-09-16 LAB — GLUCOSE, CAPILLARY
GLUCOSE-CAPILLARY: 101 mg/dL — AB (ref 65–99)
GLUCOSE-CAPILLARY: 109 mg/dL — AB (ref 65–99)
Glucose-Capillary: 114 mg/dL — ABNORMAL HIGH (ref 65–99)

## 2014-09-16 MED ORDER — FAT EMULSION 20 % IV EMUL
240.0000 mL | INTRAVENOUS | Status: DC
  Filled 2014-09-16: qty 250

## 2014-09-16 MED ORDER — TRACE MINERALS CR-CU-MN-SE-ZN 10-1000-500-60 MCG/ML IV SOLN
INTRAVENOUS | Status: AC
  Administered 2014-09-16: 17:00:00 via INTRAVENOUS
  Filled 2014-09-16: qty 1920

## 2014-09-16 MED ORDER — TRACE MINERALS CR-CU-MN-SE-ZN 10-1000-500-60 MCG/ML IV SOLN
INTRAVENOUS | Status: DC
  Filled 2014-09-16: qty 2400

## 2014-09-16 MED ORDER — HYDROCODONE-ACETAMINOPHEN 5-325 MG PO TABS: 1.0000 | ORAL_TABLET | ORAL | Status: DC | PRN

## 2014-09-16 MED ORDER — CHOLESTYRAMINE LIGHT 4 G PO PACK
4.0000 g | PACK | Freq: Three times a day (TID) | ORAL | Status: DC
  Administered 2014-09-16 – 2014-09-17 (×6): 4 g via ORAL
  Filled 2014-09-16 (×12): qty 1

## 2014-09-16 MED ORDER — FAT EMULSION 20 % IV EMUL
240.0000 mL | INTRAVENOUS | Status: AC
  Administered 2014-09-16: 240 mL via INTRAVENOUS
  Filled 2014-09-16: qty 250

## 2014-09-16 NOTE — Progress Notes (Signed)
[facility] Progress Note  7 Days Post-Op  Subjective: C/o watery diarrhea every 2 hours with urgency.  No N/V, not much abdominal pain.  Wonders when staples can come out.  Ambulating well in the halls.  Having flatus and BM's.  Wife at bedside.    Objective: Vital signs in last 24 hours: Temp:  [98.1 F (36.7 C)-99.9 F (37.7 C)] 99 F (37.2 C) (05/25 0445) Pulse Rate:  [77-101] 77 (05/25 0445) Resp:  [16] 16 (05/25 0445) BP: (118-133)/(62-76) 118/62 mmHg (05/25 0445) SpO2:  [97 %-98 %] 97 % (05/25 0445) Last BM Date: 09/15/14  Intake/Output from previous day: 05/24 0701 - 05/25 0700 In: 4502.8 [P.O.:720; I.V.:1075; TPN:2707.8] Out: 800 [Urine:800] Intake/Output this shift: Total I/O In: -  Out: 700 [Urine:700]  PE: Gen:  Alert, NAD, pleasant Card:  RRR, no M/G/R heard Pulm:  CTA, no W/R/R, great effort Abd: Soft, NT/ND, +BS, no HSM, incisions C/D/I with staples in place   Lab Results:   Recent Labs  09/15/14 0357 09/16/14 0350  WBC 16.9* 14.6*  HGB 7.6* 7.6*  HCT 24.3* 23.1*  PLT 333 380   BMET  Recent Labs  09/15/14 0357 09/16/14 0350  NA 129* 127*  K 4.0 4.1  CL 96* 96*  CO2 25 24  GLUCOSE 126* 120*  BUN 15 13  CREATININE 0.44* 0.61  CALCIUM 8.0* 7.7*   PT/INR No results for input(s): LABPROT, INR in the last 72 hours. CMP     Component Value Date/Time   NA 127* 09/16/2014 0350   NA 132* 08/25/2014 0817   K 4.1 09/16/2014 0350   K 3.8 08/25/2014 0817   CL 96* 09/16/2014 0350   CO2 24 09/16/2014 0350   CO2 22 08/25/2014 0817   GLUCOSE 120* 09/16/2014 0350   GLUCOSE 125 08/25/2014 0817   BUN 13 09/16/2014 0350   BUN 11.5 08/25/2014 0817   CREATININE 0.61 09/16/2014 0350   CREATININE 0.6* 08/25/2014 0817   CALCIUM 7.7* 09/16/2014 0350   CALCIUM 8.0* 08/25/2014 0817   PROT 5.4* 09/14/2014 0600   PROT 5.5* 08/25/2014 0817   ALBUMIN 1.8* 09/14/2014 0600   ALBUMIN 2.5* 08/25/2014 0817   AST 20 09/14/2014 0600   AST 38*  08/25/2014 0817   ALT 16* 09/14/2014 0600   ALT 28 08/25/2014 0817   ALKPHOS 133* 09/14/2014 0600   ALKPHOS 82 08/25/2014 0817   BILITOT 0.6 09/14/2014 0600   BILITOT 0.40 08/25/2014 0817   GFRNONAA >60 09/16/2014 0350   GFRAA >60 09/16/2014 0350   Lipase     Component Value Date/Time   LIPASE 32 09/06/2014 0932       Studies/Results: No results found.  Anti-infectives: Anti-infectives    Start     Dose/Rate Route Frequency Ordered Stop   09/09/14 0600  cefOXitin (MEFOXIN) 2 g in dextrose 5 % 50 mL IVPB     2 g 100 mL/hr over 30 Minutes Intravenous On call to O.R. 09/08/14 1354 09/09/14 1243       Assessment/Plan Malignant SBO POD #7 s/p Ex Lap, SBR terminal ileum, right colectomy, small bowel bypass Continues gradual improvement and resolution of ileus. Discontinued NG, started on full liquids, advance as tolerated Staples to be removed on POD #10 Not using much pain meds, but d/c IV pain meds and add orals  Moderate to severe PCM TNA to 1/2 dosage since tolerating full liquids, likely d/c tomorrow  Diarrhea. Probably physiologic, C. difficile PCR negative. IV saline at 100 mL  per hour addition to TNA to avoid dehydration. 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 Status post FOLFOX Chemotherapy. Dr. [name] managing medical oncology issues.  Leukocytosis. Significance unclear. Improving to 14.6.  Clinically not toxic. No evidence of wound infection. No pulmonary symptoms. Monitor.  DVT prophylaxis. On Lovenox.    LOS: 10 days    Nat Christen 09/16/2014, 8:36 AM Pager: [phone]

## 2014-09-16 NOTE — Progress Notes (Signed)
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 surgery following - Initially treated with bowel rest, NG tube and IV fluids but had persistent high-grade obstruction - Status post exploratory laparotomy, resection of terminal ileum and right colon and small bowel bypass on 5/18 - Pt has been continued on TNA - Tolerating diet with plans to continue weaning TNA, hopefully off by 5/26 - Postop ileus resolved - Still with diarrhea which is probably physiologic (C. difficile PCR negative). Cholestyramine ordered per Surgery  Iron deficiency anemia - Status post 2 units PRBCs intraoperatively 5/18.  - hgb remaining stable at 7.6 - Continue to monitor  Leukocytosis - Possibly related to Neulasta and surgical stress - Admitted with WBC count of 27K. Improving - C. difficile PCR negative.  Hypokalemia - replaced  Severe malnutrition in context of chronic illness -Management per dietitian. Currently on TNA   Nonproductive cough - Possibly from NG tube irritation - In the absence of fever or productive cough, pneumonia felt less likely. Patient has had leukocytosis for a while - CT abdomen 5/17 showed small bilateral pleural effusion and atelectasis - Cont with Robitussin-DM, incentive spirometry and monitor.  Diarrhea  - Probably physiologic related to prolonged postop ileus s/p partial small bowel resection, R colectomy and small bowel bypass - C. difficile PCR negative - Holding off on antimotility drugs. - On IV fluid hydration in addition to TNA, by surgery  Hyponatremia - Probably related to GI losses  and some dehydration. - Continue IV normal saline and follow. Stable  Benign essential tremor - Continued on primidone and Inderal at lower than home dose.  Essential hypertension - Overall stable and controlled - Currently on Inderal at lower than home dose - Holding ARB  Code Status: Full Family Communication: Pt in room  Disposition Plan: Pending   Consultants:  General Surgery  Oncology  Procedures:  Exploratory laparotomy, resection of terminal ileum and right colon and small bowel bypass on 5/18  Antibiotics:  Cefoxitin 5/18  HPI/Subjective: States feeling somewhat bloated, but passing flatus and stool. No other complaints  Objective: Filed Vitals:   09/15/14 1340 09/15/14 2049 09/16/14 0445 09/16/14 1500  BP: 130/74 133/76 118/62 125/76  Pulse: 101 81 77 83  Temp: 99.9 F (37.7 C) 98.1 F (36.7 C) 99 F (37.2 C) 98.2 F (36.8 C)  TempSrc: Oral Oral Oral Oral  Resp: 16 16 16 20   Height:      Weight:      SpO2: 97% 98% 97% 100%    Intake/Output Summary (Last 24 hours) at 09/16/14 1636 Last data filed at 09/16/14 0841  Gross per 24 hour  Intake 4262.84 ml  Output   1500 ml  Net 2762.84 ml   Filed Weights   09/06/14 1212  Weight: 77.52 kg (170 lb 14.4 oz)    Exam:   General:  Awake, ambulatory in hallway, in nad  Cardiovascular: regular, s1, s2  Respiratory: normal resp effort, no wheezing  Abdomen: soft, pos BS, post-op dressings in place  Musculoskeletal: perfused, no clubbing   Data Reviewed: Basic Metabolic Panel:  Recent Labs Lab 09/10/14 0355 09/11/14 0430 09/12/14 0403 09/13/14 0411 09/14/14 0600 09/15/14 0357 09/16/14 0350  NA 136 136 132*  --  130* 129* 127*  K 4.6 4.5 4.1  --  4.2 4.0 4.1  CL 105 103 99*  --  95* 96* 96*  CO2 24 26 26   --  26 25 24   GLUCOSE 162* 128* 131*  --  133* 126* 120*  BUN 11 12 13   --  16 15 13   CREATININE 0.72 0.63 0.49* 0.46* 0.64 0.44* 0.61  CALCIUM 7.8* 7.8* 7.6*  --  7.9* 8.0* 7.7*   MG 1.7 2.2  --   --  1.9  --   --   PHOS 2.7  --   --   --  3.1  --   --    Liver Function Tests:  Recent Labs Lab 09/10/14 0355 09/14/14 0600  AST 20 20  ALT 12* 16*  ALKPHOS 85 133*  BILITOT 0.4 0.6  PROT 4.8* 5.4*  ALBUMIN 2.1* 1.8*   No results for input(s): LIPASE, AMYLASE in the last 168 hours. No results for input(s): AMMONIA in the last 168 hours. CBC:  Recent Labs Lab 09/12/14 0403 09/13/14 0411 09/14/14 0600 09/15/14 0357 09/16/14 0350  WBC 18.1* 15.9* 18.5* 16.9* 14.6*  NEUTROABS  --   --  14.4*  --   --   HGB 8.2* 8.6* 8.0* 7.6* 7.6*  HCT 25.4* 26.5* 25.4* 24.3* 23.1*  MCV 87.0 86.6 86.4 86.2 86.2  PLT 188 280 302 333 380   Cardiac Enzymes: No results for input(s): CKTOTAL, CKMB, CKMBINDEX, TROPONINI in the last 168 hours. BNP (last 3 results) No results for input(s): BNP in the last 8760 hours.  ProBNP (last 3 results) No results for input(s): PROBNP in the last 8760 hours.  CBG:  Recent Labs Lab 09/15/14 0740 09/15/14 1616 09/16/14 0006 09/16/14 0754 09/16/14 1608  GLUCAP 115* 103* 114* 101* 109*    Recent Results (from the past 240 hour(s))  Surgical pcr screen     Status: None   Collection Time: 09/08/14 10:02 PM  Result Value Ref Range Status   MRSA, PCR NEGATIVE NEGATIVE Final   Staphylococcus aureus NEGATIVE NEGATIVE Final    Comment:        The Xpert SA Assay (FDA approved for NASAL specimens in patients over 43 years of age), is one component of a comprehensive surveillance program.  Test performance has been validated by [facility] for patients greater than or equal to 65 year old. It is not intended to diagnose infection nor to guide or monitor treatment.   Clostridium Difficile by PCR     Status: None   Collection Time: 09/15/14  3:03 AM  Result Value Ref Range Status   C difficile by pcr NEGATIVE NEGATIVE Final     Studies: No results found.  Scheduled Meds: . atorvastatin  10 mg Oral q morning - 10a  .  cholestyramine light  4 g Oral TID  . enoxaparin (LOVENOX) injection  40 mg Subcutaneous Q24H  . famotidine  20 mg Oral BID  . insulin aspart  0-9 Units Subcutaneous Q8H  . primidone  250 mg Oral q morning - 10a  . propranolol ER  60 mg Oral Daily   Continuous Infusions: . Marland KitchenTPN (CLINIMIX-E) Adult     And  . fat emulsion    . sodium chloride 75 mL/hr at 09/16/14 1139  . Marland KitchenTPN (CLINIMIX-E) Adult 100 mL/hr at 09/15/14 1737   And  . fat emulsion 240 mL (09/15/14 1737)    Active Problems:   Small  bowel obstruction   Kina Shiffman, [facility] Pager [phone]. If 7PM-7AM, please contact night-coverage at [email or web address], password [facility] 09/16/2014, 4:36 PM  LOS: 10 days

## 2014-09-16 NOTE — Progress Notes (Addendum)
PARENTERAL NUTRITION CONSULT NOTE - Follow up  Pharmacy Consult for TPN Indication: SBO/ abdominal carcinomatosis  No Known Allergies  Patient Measurements: Height: 6' 1"  (185.4 cm) Weight: 170 lb 14.4 oz (77.52 kg) IBW/kg (Calculated) : 79.9  Vital Signs: Temp: 99 F (37.2 C) (05/25 0445) Temp Source: Oral (05/25 0445) BP: 118/62 mmHg (05/25 0445) Pulse Rate: 77 (05/25 0445) Intake/Output from previous day: 05/24 0701 - 05/25 0700 In: 4502.8 [P.O.:720; I.V.:1075; TPN:2707.8] Out: 800 [Urine:800] Intake/Output from this shift: Total I/O In: -  Out: 700 [Urine:700]  Labs:  Recent Labs  09/14/14 0600 09/15/14 0357 09/16/14 0350  WBC 18.5* 16.9* 14.6*  HGB 8.0* 7.6* 7.6*  HCT 25.4* 24.3* 23.1*  PLT 302 333 380    Recent Labs  09/14/14 0600 09/15/14 0357 09/16/14 0350  NA 130* 129* 127*  K 4.2 4.0 4.1  CL 95* 96* 96*  CO2 26 25 24   GLUCOSE 133* 126* 120*  BUN 16 15 13   CREATININE 0.64 0.44* 0.61  CALCIUM 7.9* 8.0* 7.7*  MG 1.9  --   --   PHOS 3.1  --   --   PROT 5.4*  --   --   ALBUMIN 1.8*  --   --   AST 20  --   --   ALT 16*  --   --   ALKPHOS 133*  --   --   BILITOT 0.6  --   --   PREALBUMIN 4.7*  --   --   TRIG 44  --   --    Estimated Creatinine Clearance: 98.2 mL/min (by C-G formula based on Cr of 0.61).    Recent Labs  09/15/14 1616 09/16/14 0006 09/16/14 0754  GLUCAP 103* 114* 101*    Medical History: Past Medical History  Diagnosis Date  . Tremor     takes Primidone 250 once daily and inderall 160 daily for this-has had it since he was 21  . Pneumonia 06/2012  . Macular degeneration   . Benign essential tremor   . Coronary artery disease   . Anemia 06/30/2014  . 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 Essential hypertension 08/18/2014   Insulin Requirements: 0 units  Current Nutrition: Full liquid diet  IVF: NS at 75 ml/hr  Central access: use PAC TPN start date: 5/16  ASSESSMENT                                                                                                           HPI: 57 yoM with Gastric Ca, abd carcinomatosis, 3rd admit for SBO this year. Required TPN last admit 4/26-5/2. NG tube place, surgery consulted - Considering palliative surgery. Begin TPN today 5/16, use PAC for central access  Significant events:  5/16: K low, 4 runs KCl 10 mEq 5/17: K & Phos low, K Phos 76mol bolus, plan abd CT 5/18: To OR for lap resection of obstructing mass. 5/23: NGT d/c'd.  Passing flatus and stool.   5/24: Complaints of diarrhea. CDiff negative. Start CLD 5/25: FL diet, add Questran for diarrhea, Hgb  7.6-consider transfusion if drops further  Today:   Glucose - CBG's at goal (< 150)   Electrolytes - NA remains low (diarrhea), Corr Ca 9.3,Cl slightly low  Renal - wnl  LFTs - wnl, alk phos slightly elevated (5/23)  TGs - 98 (5/17), 44 (5/23)  Prealbumin - 7.1 (5/17), 4.7 (5/23)  NUTRITIONAL GOALS                                                                                             RD recs: 2100-2300Kcal, 110-120g, 2.1L per day. Clinimix E 5/15 at a goal rate of 100 ml/hr + 20% fat emulsion at 10 ml/hr to provide: 120 g/day protein, 2180 Kcal/day.  PLAN                                                                                                                         At 1800 today:  Continue Clinimix E 5/15 at goal rate of 100 ml/hr.    Cont 20% fat emulsion at 41m/hr  Continue IV at 75/hr  Plan advance to soft diet, then can wean TPN  TPN to contain standard multivitamins and trace elements.  Cont Sensitive SSI q8h  TPN lab panels on Mondays & Thursdays.  F/u daily.  GMinda DittoPharmD Pager [phone]/25/2016, 10:18 AM   Addendum: rate reduced to 80 ml/hr. CCS note requested 1/2 rate to 50 ml/hr which necessitates a 2L bag. Rate at 80 ml/hr will deliver total pre-mixed 2L bag of TPN. Weaning of TPN will not be complicated as CBG's wnl despite TPN and diet.   Thank you  GMinda DittoPharmD Pager [phone]/25/2016, 10:20 AM

## 2014-09-17 DIAGNOSIS — E876 Hypokalemia: Secondary | ICD-10-CM

## 2014-09-17 LAB — GLUCOSE, CAPILLARY
Glucose-Capillary: 105 mg/dL — ABNORMAL HIGH (ref 65–99)
Glucose-Capillary: 125 mg/dL — ABNORMAL HIGH (ref 65–99)

## 2014-09-17 LAB — COMPREHENSIVE METABOLIC PANEL
ALBUMIN: 1.6 g/dL — AB (ref 3.5–5.0)
ALK PHOS: 128 U/L — AB (ref 38–126)
ALT: 26 U/L (ref 17–63)
AST: 26 U/L (ref 15–41)
Anion gap: 8 (ref 5–15)
BUN: 11 mg/dL (ref 6–20)
CO2: 20 mmol/L — ABNORMAL LOW (ref 22–32)
Calcium: 7.5 mg/dL — ABNORMAL LOW (ref 8.9–10.3)
Chloride: 98 mmol/L — ABNORMAL LOW (ref 101–111)
Creatinine, Ser: 0.51 mg/dL — ABNORMAL LOW (ref 0.61–1.24)
GFR calc non Af Amer: >60 mL/min (ref >60)
GLUCOSE: 125 mg/dL — AB (ref 65–99)
Potassium: 3.7 mmol/L (ref 3.5–5.1)
SODIUM: 126 mmol/L — AB (ref 135–145)
Total Bilirubin: 1.2 mg/dL (ref 0.3–1.2)
Total Protein: 5.3 g/dL — ABNORMAL LOW (ref 6.5–8.1)

## 2014-09-17 LAB — CLOSTRIDIUM DIFFICILE BY PCR: Toxigenic C. Difficile by PCR: NEGATIVE

## 2014-09-17 LAB — MAGNESIUM: Magnesium: 2 mg/dL (ref 1.7–2.4)

## 2014-09-17 LAB — CREATININE, URINE, RANDOM: Creatinine, Urine: 112.3 mg/dL

## 2014-09-17 LAB — OSMOLALITY: OSMOLALITY: 260 mosm/kg — AB (ref 275–300)

## 2014-09-17 LAB — PHOSPHORUS: Phosphorus: 2.7 mg/dL (ref 2.5–4.6)

## 2014-09-17 LAB — OSMOLALITY, URINE: Osmolality, Ur: 739 mOsm/kg (ref 390–1090)

## 2014-09-17 LAB — SODIUM, URINE, RANDOM: Sodium, Ur: 15 mEq/L

## 2014-09-17 MED ORDER — PSYLLIUM 95 % PO PACK
1.0000 | PACK | Freq: Two times a day (BID) | ORAL | Status: DC
  Administered 2014-09-17 (×2): 1 via ORAL
  Filled 2014-09-17 (×4): qty 1

## 2014-09-17 MED ORDER — SACCHAROMYCES BOULARDII 250 MG PO CAPS
250.0000 mg | ORAL_CAPSULE | Freq: Two times a day (BID) | ORAL | Status: DC
  Administered 2014-09-17 – 2014-09-18 (×3): 250 mg via ORAL
  Filled 2014-09-17 (×4): qty 1

## 2014-09-17 MED ORDER — ENSURE ENLIVE PO LIQD
237.0000 mL | Freq: Three times a day (TID) | ORAL | Status: DC
  Administered 2014-09-17: 237 mL via ORAL

## 2014-09-17 NOTE — Progress Notes (Signed)
Nutrition Follow-up  DOCUMENTATION CODES:  Severe malnutrition in context of chronic illness  INTERVENTION:  -TPN weaning per Pharmacy -Continue to offer Ensure Enlive po BID, each supplement provides 350 kcal and 20 grams of protein -Provide Magic cup TID with meals, each supplement provides 290 kcal and 9 grams of protein -Provided information on how to order [employer] from [employer]  -Provided information and price sheet regarding protein supplements provided at [place] [facility] -RD to continue to monitor  NUTRITION DIAGNOSIS:  Malnutrition related to chronic illness as evidenced by percent weight loss, severe depletion of body fat, severe depletion of muscle mass.  Ongoing.  GOAL:  Patient will meet greater than or equal to 90% of their needs  Meeting with TPN/diet.  MONITOR:  PO intake, Supplement acceptance, Labs, Weight trends, Skin, I & O's, Other (Comment) (TPN weaning)   ASSESSMENT: 69 yo male w/ relatively new dx of abdominal carcinomatosis that is being treated by Dr Learta Codding w/ FOLFOX therapy, next dose due Tuesday . He presents to the ED after having developed intolerance to PO w/ nausea and emesis in association w/ his baseline of abdominal distension and a few recent admissions for SBO.  S/p 5/18 Procedure(s): EXPLORATORY LAPAROTOMY resection terminal ileum and right colon, SMALL BOWEL bypass  5/16: Pt reports eating lunch and dinner on 5/14 with no issues. Pt is following a soft diet at home. Pt states N/V started on 5/15.  Pt currently NPO d/t bowel rest for SBO, TPN initiated 5/17.  Pt with 11 lb of weight loss since 4/19 (6% weight loss x 1 month, siginificant for time frame).  5/20: -Per surgery, pt had some nausea last night. No N/V today.  -Pt walking in the halls today. No BM yet. -Pt still with NGT for suction -TPN will advance to goal tonight.  5/23: -Pt reports feeling much better with BM today. -Pt with  eating ice chips during visit. NGT removed. -If diet is advanced, pt does not want nutritional supplements. RD to follow-up once diet is advanced to address supplement options.  5/26: -Pt's TPN to be discontinued today at noon. -Pt consuming 50% of regular diet. Pt reports appetite as "fair". -Pt is willing to try Magic Cups with meals. RD provided information on how to order Magic Cups after discharge. Pt and pt's wife also interested in other protein supplement options other than Ensure/Boost. Provided information about protein supplements sold at  [facility].  Labs reviewed: Low Na, Creatinine CBGs: 105-125  Plan per Pharmacy (5/23): At 1800 today:  Reduce Clinimix E 5/15 to 95ml/hr at 8a, discontinue at 12N  Discontinue 20% fat emulsion at 12N  Height:  Ht Readings from Last 1 Encounters:  09/06/14 6\' 1"  (1.854 m)    Weight:  Wt Readings from Last 1 Encounters:  09/06/14 170 lb 14.4 oz (77.52 kg)    Ideal Body Weight:  83.6 kg  Wt Readings from Last 10 Encounters:  09/06/14 170 lb 14.4 oz (77.52 kg)  08/25/14 179 lb 11.2 oz (81.511 kg)  08/18/14 173 lb (78.472 kg)  08/11/14 181 lb 12.8 oz (82.464 kg)  07/28/14 181 lb 12.8 oz (82.464 kg)  07/08/14 183 lb 11.2 oz (83.326 kg)  07/02/14 191 lb 2.2 oz (86.7 kg)  04/09/14 191 lb (86.637 kg)  12/16/13 183 lb 3.2 oz (83.099 kg)  11/03/13 183 lb 6.4 oz (83.19 kg)    BMI:  Body mass index is 22.55 kg/(m^2).  Estimated Nutritional Needs:  Kcal:  2100-2300  Protein:  110-120g  Fluid:  2.1L/day     Skin:  Reviewed, no issues  Diet Order:  Diet regular Room service appropriate?: Yes; Fluid consistency:: Thin  EDUCATION NEEDS:  No education needs identified at this time   Intake/Output Summary (Last 24 hours) at 09/17/14 1307 Last data filed at 09/17/14 0850  Gross per 24 hour  Intake    460 ml  Output    150 ml  Net    310 ml    Last BM:  5/26  [name] Bibles, MS, RD, LDN Pager:  [phone] After Hours Pager: [phone]

## 2014-09-17 NOTE — Progress Notes (Signed)
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 surgery following - Initially patient was treated with bowel rest, NG tube, and IV fluids without improvement of obstruction - Pt is now s/p exploratory laparotomy, resection of terminal ileum and right colon and small bowel bypass on 5/18 - Pt has been continued on TNA, to be stopped very shortly - Pt is tolerating diet well - Postop ileus resolved - Patient remains with diarrhea which is probably physiologic (C. difficile PCR negative x3 from 4/30 through 5/26). Cholestyramine was ordered per Surgery  Iron deficiency anemia - Status post 2 units PRBCs intraoperatively 5/18.  - hgb remaining stable at 7.6 - Continue to monitor  Leukocytosis - Possibly related to Neulasta and surgical stress - Admitted with WBC count of 27K. Improving - C. difficile PCR serially negative.  Hypokalemia - replaced  Severe malnutrition in context of chronic illness -Management per dietitian. Currently on TNA   Nonproductive cough - Possibly from NG tube irritation - In the absence of fever or productive cough, pneumonia felt less likely. Patient has had leukocytosis for a while - CT abdomen 5/17 showed small bilateral pleural effusion and atelectasis - Cont with Robitussin-DM, incentive spirometry and monitor.  Diarrhea  - Probably physiologic related to prolonged postop ileus s/p partial small bowel resection, R colectomy and small bowel bypass - C. difficile PCR negative - Holding off on antimotility drugs.  Hyponatremia - Probably related to GI losses and  some dehydration. - Continue on IV normal saline and follow. - Follow up on urine and serum Osm with urine Na  Benign essential tremor - Continued on primidone and Inderal at lower than home dose.  Essential hypertension - Overall stable and controlled - Currently on Inderal at lower than home dose - Holding ARB  Code Status: Full Family Communication: Pt in room  Disposition Plan: Pending   Consultants:  General Surgery  Oncology  Procedures:  Exploratory laparotomy, resection of terminal ileum and right colon and small bowel bypass on 5/18  Antibiotics:  Cefoxitin 5/18  HPI/Subjective: No complaints this AM. Reports passing stool and flatus today. Remains eager to go home  Objective: Filed Vitals:   09/16/14 0445 09/16/14 1500 09/16/14 2132 09/17/14 0510  BP: 118/62 125/76 117/65 117/69  Pulse: 77 83 80 79  Temp: 99 F (37.2 C) 98.2 F (36.8 C) 99.7 F (37.6 C) 98.7 F (37.1 C)  TempSrc: Oral Oral Oral Oral  Resp: 16 20 20 20   Height:      Weight:      SpO2: 97% 100% 97% 97%    Intake/Output Summary (Last 24 hours) at 09/17/14 1521 Last data filed at 09/17/14 1427  Gross per 24 hour  Intake   1169 ml  Output    150 ml  Net   1019 ml   Filed Weights   09/06/14 1212  Weight: 77.52 kg (170 lb 14.4 oz)    Exam:   General:  Awake, sitting in char, in nad  Cardiovascular: regular, s1, s2  Respiratory: normal resp effort, no wheezing  Abdomen: hypoactive BS, post-op dressings in place, distended  Musculoskeletal: perfused, no clubbing   Data Reviewed: Basic  Metabolic Panel:  Recent Labs Lab 09/11/14 0430 09/12/14 0403 09/13/14 0411 09/14/14 0600 09/15/14 0357 09/16/14 0350 09/17/14 0509  NA 136 132*  --  130* 129* 127* 126*  K 4.5 4.1  --  4.2 4.0 4.1 3.7  CL 103 99*  --  95* 96* 96* 98*  CO2 26 26  --  26 25 24  20*  GLUCOSE 128* 131*  --  133* 126* 120* 125*  BUN 12 13  --  16 15 13 11   CREATININE 0.63 0.49* 0.46* 0.64 0.44*  0.61 0.51*  CALCIUM 7.8* 7.6*  --  7.9* 8.0* 7.7* 7.5*  MG 2.2  --   --  1.9  --   --  2.0  PHOS  --   --   --  3.1  --   --  2.7   Liver Function Tests:  Recent Labs Lab 09/14/14 0600 09/17/14 0509  AST 20 26  ALT 16* 26  ALKPHOS 133* 128*  BILITOT 0.6 1.2  PROT 5.4* 5.3*  ALBUMIN 1.8* 1.6*   No results for input(s): LIPASE, AMYLASE in the last 168 hours. No results for input(s): AMMONIA in the last 168 hours. CBC:  Recent Labs Lab 09/12/14 0403 09/13/14 0411 09/14/14 0600 09/15/14 0357 09/16/14 0350  WBC 18.1* 15.9* 18.5* 16.9* 14.6*  NEUTROABS  --   --  14.4*  --   --   HGB 8.2* 8.6* 8.0* 7.6* 7.6*  HCT 25.4* 26.5* 25.4* 24.3* 23.1*  MCV 87.0 86.6 86.4 86.2 86.2  PLT 188 280 302 333 380   Cardiac Enzymes: No results for input(s): CKTOTAL, CKMB, CKMBINDEX, TROPONINI in the last 168 hours. BNP (last 3 results) No results for input(s): BNP in the last 8760 hours.  ProBNP (last 3 results) No results for input(s): PROBNP in the last 8760 hours.  CBG:  Recent Labs Lab 09/16/14 0006 09/16/14 0754 09/16/14 1608 09/17/14 0002 09/17/14 0829  GLUCAP 114* 101* 109* 125* 105*    Recent Results (from the past 240 hour(s))  Surgical pcr screen     Status: None   Collection Time: 09/08/14 10:02 PM  Result Value Ref Range Status   MRSA, PCR NEGATIVE NEGATIVE Final   Staphylococcus aureus NEGATIVE NEGATIVE Final    Comment:        The Xpert SA Assay (FDA approved for NASAL specimens in patients over 68 years of age), is one component of a comprehensive surveillance program.  Test performance has been validated by [facility] for patients greater than or equal to 45 year old. It is not intended to diagnose infection nor to guide or monitor treatment.   Clostridium Difficile by PCR     Status: None   Collection Time: 09/15/14  3:03 AM  Result Value Ref Range Status   C difficile by pcr NEGATIVE NEGATIVE Final  Clostridium Difficile by PCR     Status: None    Collection Time: 09/17/14 10:18 AM  Result Value Ref Range Status   C difficile by pcr NEGATIVE NEGATIVE Final     Studies: No results found.  Scheduled Meds: . atorvastatin  10 mg Oral q morning - 10a  . cholestyramine light  4 g Oral TID  . enoxaparin (LOVENOX) injection  40 mg Subcutaneous Q24H  . famotidine  20 mg Oral BID  . feeding supplement (ENSURE ENLIVE)  237 mL Oral TID BM  . primidone  250 mg Oral q morning - 10a  . propranolol ER  60 mg Oral Daily  .  psyllium  1 packet Oral BID  . saccharomyces boulardii  250 mg Oral BID   Continuous Infusions: . sodium chloride 50 mL/hr at 09/17/14 5248    Active Problems:   Small bowel obstruction   Wesley Dillon, [facility] Pager [phone]. If 7PM-7AM, please contact night-coverage at [email or web address], password [facility] 09/17/2014, 3:21 PM  LOS: 11 days

## 2014-09-17 NOTE — Progress Notes (Signed)
PARENTERAL NUTRITION CONSULT NOTE - Follow up  Pharmacy Consult for TPN Indication: SBO/ abdominal carcinomatosis  No Known Allergies  Patient Measurements: Height: _0  (185.4 cm) Weight: 170 lb 14.4 oz (77.52 kg) IBW/kg (Calculated) : 79.9  Vital Signs: Temp: 98.7 F (37.1 C) (05/26 0510) Temp Source: Oral (05/26 0510) BP: 117/69 mmHg (05/26 0510) Pulse Rate: 79 (05/26 0510) Intake/Output from previous day: 05/25 0701 - 05/26 0700 In: 340 [P.O.:340] Out: 850 [Urine:850] Intake/Output from this shift: Total I/O In: 360 [P.O.:360] Out: -   Labs:  Recent Labs  09/15/14 0357 09/16/14 0350  WBC 16.9* 14.6*  HGB 7.6* 7.6*  HCT 24.3* 23.1*  PLT 333 380    Recent Labs  09/15/14 0357 09/16/14 0350 09/17/14 0509  NA 129* 127* 126*  K 4.0 4.1 3.7  CL 96* 96* 98*  CO2 25 24 20*  GLUCOSE 126* 120* 125*  BUN _1 CREATININE 0.44* 0.61 0.51*  CALCIUM 8.0* 7.7* 7.5*  MG  --   --  2.0  PHOS  --   --  2.7  PROT  --   --  5.3*  ALBUMIN  --   --  1.6*  AST  --   --  26  ALT  --   --  26  ALKPHOS  --   --  128*  BILITOT  --   --  1.2   Estimated Creatinine Clearance: 98.2 mL/min (by C-G formula based on Cr of 0.51).    Recent Labs  09/16/14 1608 09/17/14 0002 09/17/14 0829  GLUCAP 109* 125* 105*   Medical History: Past Medical History  Diagnosis Date  . Tremor     takes Primidone 250 once daily and inderall 160 daily for this-has had it since he was 21  . Pneumonia 06/2012  . Macular degeneration   . Benign essential tremor   . Coronary artery disease   . Anemia 06/30/2014  . 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 Essential hypertension 08/18/2014   Insulin Requirements:  unit  Current Nutrition: Soft diet  IVF: NS at 50 ml/hr  Central access: use PAC TPN start date: 5/16  ASSESSMENT                                                                                                          HPI: 49 yoM with Gastric Ca, abd carcinomatosis,  3rd admit for SBO this year. Required TPN last admit 4/26-5/2. NG tube place, surgery consulted - Considering palliative surgery. Begin TPN today 5/16, use PAC for central access  Significant events:  5/16: K low, 4 runs KCl 10 mEq 5/17: K & Phos low, K Phos 51mol bolus, plan abd CT 5/18: To OR for lap resection of obstructing mass. 5/23: NGT d/c'd.  Passing flatus and stool.   5/24: Complaints of diarrhea. CDiff negative. Start CLD 5/25: FL diet, add Questran for diarrhea, Hgb 7.6-consider transfusion if drops further 5/26: wean and d/c TPN today  Today:   Glucose - CBG's at goal (< 150)   Electrolytes - NA, CL  remains low (diarrhea), Corr Ca 9.1  Renal - wnl  LFTs - wnl, alk phos slightly elevated (5/26)  TGs - 98 (5/17), 44 (5/23)  Prealbumin - 7.1 (5/17), 4.7 (5/23)  NUTRITIONAL GOALS                                                                                             RD recs: 2100-2300Kcal, 110-120g, 2.1L per day. Clinimix E 5/15 at a goal rate of 100 ml/hr + 20% fat emulsion at 10 ml/hr to provide: 120 g/day protein, 2180 Kcal/day.  PLAN                                                                                                                         At 1800 today:  Reduce Clinimix E 5/15 to 25m/hr at 8a, discontinue at 12N    Discontinue 20% fat emulsion at 12N  Continue IV at 50 ml/hr  Discontinued standard labs, care notes, Novolog scale  GMinda DittoPharmD Pager [phone]/26/2016, 9:52 AM

## 2014-09-17 NOTE — Progress Notes (Signed)
[facility] Progress Note  8 Days Post-Op  Subjective: Pt feels good other than diarrhea every 2 hours, no improvement since yesterday.  Ambulating well.  Tolerating soft diet.  Had 1/2 an omelet this am.  Wife at bedside.  Objective: Vital signs in last 24 hours: Temp:  [98.2 F (36.8 C)-99.7 F (37.6 C)] 98.7 F (37.1 C) (05/26 0510) Pulse Rate:  [79-83] 79 (05/26 0510) Resp:  [20] 20 (05/26 0510) BP: (117-125)/(65-76) 117/69 mmHg (05/26 0510) SpO2:  [97 %-100 %] 97 % (05/26 0510) Last BM Date: 09/16/14  Intake/Output from previous day: 05/25 0701 - 05/26 0700 In: 340 [P.O.:340] Out: 850 [Urine:850] Intake/Output this shift:    PE: Gen: Alert, NAD, pleasant Card: RRR, no M/G/R heard Pulm: CTA, no W/R/R, great effort Abd: Soft, NT/ND, +BS, no HSM, incisions C/D/I with staples in place   Lab Results:   Recent Labs  09/15/14 0357 09/16/14 0350  WBC 16.9* 14.6*  HGB 7.6* 7.6*  HCT 24.3* 23.1*  PLT 333 380   BMET  Recent Labs  09/16/14 0350 09/17/14 0509  NA 127* 126*  K 4.1 3.7  CL 96* 98*  CO2 24 20*  GLUCOSE 120* 125*  BUN 13 11  CREATININE 0.61 0.51*  CALCIUM 7.7* 7.5*   PT/INR No results for input(s): LABPROT, INR in the last 72 hours. CMP     Component Value Date/Time   NA 126* 09/17/2014 0509   NA 132* 08/25/2014 0817   K 3.7 09/17/2014 0509   K 3.8 08/25/2014 0817   CL 98* 09/17/2014 0509   CO2 20* 09/17/2014 0509   CO2 22 08/25/2014 0817   GLUCOSE 125* 09/17/2014 0509   GLUCOSE 125 08/25/2014 0817   BUN 11 09/17/2014 0509   BUN 11.5 08/25/2014 0817   CREATININE 0.51* 09/17/2014 0509   CREATININE 0.6* 08/25/2014 0817   CALCIUM 7.5* 09/17/2014 0509   CALCIUM 8.0* 08/25/2014 0817   PROT 5.3* 09/17/2014 0509   PROT 5.5* 08/25/2014 0817   ALBUMIN 1.6* 09/17/2014 0509   ALBUMIN 2.5* 08/25/2014 0817   AST 26 09/17/2014 0509   AST 38* 08/25/2014 0817   ALT 26 09/17/2014 0509   ALT 28 08/25/2014 0817   ALKPHOS 128*  09/17/2014 0509   ALKPHOS 82 08/25/2014 0817   BILITOT 1.2 09/17/2014 0509   BILITOT 0.40 08/25/2014 0817   GFRNONAA >60 09/17/2014 0509   GFRAA >60 09/17/2014 0509   Lipase     Component Value Date/Time   LIPASE 32 09/06/2014 0932       Studies/Results: No results found.  Anti-infectives: Anti-infectives    Start     Dose/Rate Route Frequency Ordered Stop   09/09/14 0600  cefOXitin (MEFOXIN) 2 g in dextrose 5 % 50 mL IVPB     2 g 100 mL/hr over 30 Minutes Intravenous On call to O.R. 09/08/14 1354 09/09/14 1243       Assessment/Plan Malignant SBO POD #8 s/p Ex Lap, SBR terminal ileum, right colectomy, small bowel bypass Continues gradual improvement and resolution of ileus. 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 Probably physiologic, C. difficile PCR negative. IV saline at 50 mL per hour  Continue Cholestyramine since diarrhea still occuring every 2 hours, consider increasing  cholestyramine Add metamucil, and probiotic Check stool culture, repeat c.diff, stool leukocyte is not available at this lab - they've told me to order lactoferrin  Malignant SBO. 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 Status post FOLFOX Chemotherapy. Dr. [name] managing medical oncology issues.  Leukocytosis. Significance unclear. Improving to 14.6 yesterday. Clinically not toxic. No evidence of wound infection. No pulmonary symptoms. Monitor.  DVT prophylaxis. On Lovenox.    LOS: 11 days    Nat Christen 09/17/2014, 7:36 AM Pager: [phone]

## 2014-09-18 DIAGNOSIS — E222 Syndrome of inappropriate secretion of antidiuretic hormone: Secondary | ICD-10-CM

## 2014-09-18 DIAGNOSIS — C800 Disseminated malignant neoplasm, unspecified: Secondary | ICD-10-CM

## 2014-09-18 DIAGNOSIS — E871 Hypo-osmolality and hyponatremia: Secondary | ICD-10-CM

## 2014-09-18 DIAGNOSIS — R251 Tremor, unspecified: Secondary | ICD-10-CM

## 2014-09-18 DIAGNOSIS — I10 Essential (primary) hypertension: Secondary | ICD-10-CM

## 2014-09-18 DIAGNOSIS — G250 Essential tremor: Secondary | ICD-10-CM

## 2014-09-18 LAB — CBC
HCT: 23.7 % — ABNORMAL LOW (ref 39.0–52.0)
HEMOGLOBIN: 7.8 g/dL — AB (ref 13.0–17.0)
MCH: 27.7 pg (ref 26.0–34.0)
MCHC: 32.9 g/dL (ref 30.0–36.0)
MCV: 84 fL (ref 78.0–100.0)
PLATELETS: 553 10*3/uL — AB (ref 150–400)
RBC: 2.82 MIL/uL — ABNORMAL LOW (ref 4.22–5.81)
RDW: 18.3 % — ABNORMAL HIGH (ref 11.5–15.5)
WBC: 15 10*3/uL — ABNORMAL HIGH (ref 4.0–10.5)

## 2014-09-18 LAB — BASIC METABOLIC PANEL
ANION GAP: 8 (ref 5–15)
BUN: 9 mg/dL (ref 6–20)
CHLORIDE: 96 mmol/L — AB (ref 101–111)
CO2: 19 mmol/L — AB (ref 22–32)
Calcium: 7.5 mg/dL — ABNORMAL LOW (ref 8.9–10.3)
Creatinine, Ser: 0.52 mg/dL — ABNORMAL LOW (ref 0.61–1.24)
GLUCOSE: 90 mg/dL (ref 65–99)
POTASSIUM: 3.8 mmol/L (ref 3.5–5.1)
Sodium: 123 mmol/L — ABNORMAL LOW (ref 135–145)

## 2014-09-18 LAB — FECAL LACTOFERRIN, QUANT: Fecal Lactoferrin: NEGATIVE

## 2014-09-18 MED ORDER — LOPERAMIDE HCL 2 MG PO CAPS
4.0000 mg | ORAL_CAPSULE | Freq: Once | ORAL | Status: AC
  Administered 2014-09-18: 4 mg via ORAL
  Filled 2014-09-18: qty 2

## 2014-09-18 MED ORDER — HEPARIN SOD (PORK) LOCK FLUSH 100 UNIT/ML IV SOLN
500.0000 [IU] | INTRAVENOUS | Status: DC | PRN
  Filled 2014-09-18: qty 5

## 2014-09-18 MED ORDER — SACCHAROMYCES BOULARDII 250 MG PO CAPS: 250.0000 mg | ORAL_CAPSULE | Freq: Two times a day (BID) | ORAL | Status: DC

## 2014-09-18 MED ORDER — CHOLESTYRAMINE LIGHT 4 G PO PACK: 4.0000 g | PACK | Freq: Three times a day (TID) | ORAL | Status: DC

## 2014-09-18 MED ORDER — PROPRANOLOL HCL ER 60 MG PO CP24: 60.0000 mg | ORAL_CAPSULE | Freq: Every day | ORAL | Status: DC

## 2014-09-18 NOTE — Discharge Summary (Signed)
Physician Discharge Summary  Wesley Dillon SWH:[id number] DOB: [birth year] DOA: 09/06/2014  PCP: Gerrit Heck, MD  Admit date: 09/06/2014 Discharge date: 09/18/2014  Time spent: 20 minutes  Recommendations for Outpatient Follow-up:  1. Follow up with Dr. [name] as scheduled 2. Follow up with General Surgery 3. Follow up with PCP in 1-2 weeks 4. Would repeat CBC and renal panel within one week  Discharge Diagnoses:  Principal Problem:   SBO (small bowel obstruction) Active Problems:   Tremor   Benign essential tremor   Adenocarcinoma carcinomatosis   Essential hypertension   Protein-calorie malnutrition, severe   Small bowel obstruction   SIADH (syndrome of inappropriate ADH production)   Hyponatremia  Discharge Condition: Improved  Diet recommendation: Regular  Filed Weights   09/06/14 1212  Weight: 77.52 kg (170 lb 14.4 oz)    History of present illness:  Please see admit h and p from 5/15 for details. Briefly, pt presented with abd pain, distension, and nausea. The patient was admitted for further work up.  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 Surgery had been following - Initially patient was treated with bowel rest, NG tube, and IV fluids without improvement of obstruction - Pt since underwent exploratory laparotomy with resection of terminal ileum and right colon and small bowel bypass on 5/18 - Pt was later continued on TNA, subsequently weaned off - Pt is tolerating diet well - Postop ileus resolved - Patient remains with diarrhea which is probably physiologic (C. difficile PCR negative x3 from 4/30 through 5/26). Cholestyramine was ordered per Surgery with  no significant effect. Pt to continue imodium PRN on discharge  Iron deficiency anemia - Status post 2 units PRBCs intraoperatively 5/18.  - hgb remained stable - Continue to monitor  Leukocytosis - Possibly related to Neulasta and surgical stress - Admitted with WBC count of 27K. Improved this admission - C. difficile PCR serially negative.  Hypokalemia - replaced  Severe malnutrition in context of chronic illness -Management per dietitian. Currently on TNA   Nonproductive cough - Possibly from NG tube irritation - In the absence of fever or productive cough, pneumonia felt less likely. Patient has had leukocytosis for a while - CT abdomen 5/17 showed small bilateral pleural effusion and atelectasis - Cont with Robitussin-DM, incentive spirometry  Diarrhea - Probably physiologic related to prolonged postop ileus s/p partial small bowel resection, R colectomy and small bowel bypass - C. difficile PCR negative serially - Pt to continue with PRN imodium on discharge  Hyponatremia secondary to SIADH - low serum osm, elevated urine Na and urine osm suggesting SIADH - Serum Na continued to worsen with normal saline, again suggesting SIADH - Advised 1500 cc fluid restriction - Advise repeat renal panel within one week  Benign essential tremor - Continued on primidone and Inderal at lower than home dose.  Essential hypertension - Overall stable and controlled - Currently on Inderal at lower than home dose - Holding ARB  Procedures:  Exploratory laparotomy, resection of terminal ileum and right colon and small bowel bypass on 5/18  Consultations:  General Surgery  Oncology  Discharge Exam: Filed Vitals:   09/17/14 0510 09/17/14 1616 09/17/14 2249 09/18/14 0539  BP: 117/69 113/65 108/62 108/66  Pulse:  79 76 79 79  Temp: 98.7 F (37.1 C) 98.5 F (36.9 C) 98.8 F (37.1 C) 97.7 F (36.5 C)  TempSrc: Oral Oral Oral Oral  Resp: 20 16 20 20   Height:      Weight:       SpO2: 97% 100% 96% 97%    General: Awake, ambulatory, in nad Cardiovascular: regular, s1, s2 Respiratory: normal resp effort, no wheezing  Discharge Instructions     Medication List    STOP taking these medications        losartan 25 MG tablet  Commonly known as:  COZAAR      TAKE these medications        acetaminophen 500 MG tablet  Commonly known as:  TYLENOL  Take 1,000 mg by mouth every 6 (six) hours as needed for moderate pain or headache.     atorvastatin 10 MG tablet  Commonly known as:  LIPITOR  Take 10 mg by mouth every morning.     cholestyramine light 4 G packet  Commonly known as:  PREVALITE  Take 1 packet (4 g total) by mouth 3 (three) times daily.     docusate sodium 100 MG capsule  Commonly known as:  COLACE  Take 1 capsule (100 mg total) by mouth 2 (two) times daily.     HYDROcodone-acetaminophen 5-325 MG per tablet  Commonly known as:  NORCO/VICODIN  Take 1-2 tablets by mouth every 4 (four) hours as needed for moderate pain.     ibuprofen 200 MG tablet  Commonly known as:  ADVIL,MOTRIN  Take 800 mg by mouth every 6 (six) hours as needed for headache or moderate pain.     ipratropium 0.06 % nasal spray  Commonly known as:  ATROVENT  Place 2 sprays into both nostrils every 6 (six) hours as needed for rhinitis.     lidocaine-prilocaine cream  Commonly known as:  EMLA  Apply 1 application topically as needed. Apply to PAC 1-2 hours prior to stick and cover with plastic wrap     ondansetron 4 MG disintegrating tablet  Commonly known as:  ZOFRAN ODT  Take 1 tablet (4 mg total) by mouth every 4 (four) hours as needed for nausea or vomiting.     oxyCODONE 5 MG immediate release tablet  Commonly known as:  Oxy IR/ROXICODONE  Take 1-2 tablets (5-10 mg total) by mouth every 4 (four) hours as needed for severe pain.     PRESCRIPTION MEDICATION  Chemo Chcc     PRESERVISION AREDS PO  Take 2 capsules by mouth every morning.     primidone 250 MG  tablet  Commonly known as:  MYSOLINE  Take 1 tablet (250 mg total) by mouth at bedtime.     prochlorperazine 10 MG tablet  Commonly known as:  COMPAZINE  Take 1 tablet (10 mg total) by mouth every 6 (six) hours as needed for nausea.     propranolol ER 60 MG 24 hr capsule  Commonly known as:  INDERAL LA  Take 1 capsule (60 mg total) by mouth daily.     ranitidine 150 MG tablet  Commonly known as:  ZANTAC  Take 150 mg by mouth 2 (two) times daily.     saccharomyces boulardii 250 MG capsule  Commonly known as:  FLORASTOR  Take 1 capsule (250 mg total) by mouth 2 (two) times daily.       No Known Allergies Follow-up Information    Schedule an appointment as soon as possible for  a visit with Edward Jolly, MD.   Specialty:  General Surgery   Why:  For post-operation check, call the office to confirm the date and time with Dr. Excell Seltzer, the office should already be working on a date/time.   Contact information:   [address] [place] [address] [phone]       Follow up with Gerrit Heck, MD. Schedule an appointment as soon as possible for a visit in 1 week.   Specialty:  Family Medicine   Why:  Hospital follow up   Contact information:   [place] [address] [phone]       Follow up with Betsy Coder, MD. Schedule an appointment as soon as possible for a visit in 1 week.   Specialty:  Oncology   Why:  Hospital follow up   Contact information:   [place] [address] [phone]        The results of significant diagnostics from this hospitalization (including imaging, microbiology, ancillary and laboratory) are listed below for reference.    Significant Diagnostic Studies: Ct Abdomen Pelvis W Contrast  09/08/2014   ADDENDUM REPORT: 09/08/2014 13:17  ADDENDUM: The transition point is in the anterior aspect of the left mid abdomen where the small bowel wall thickening begins, best seen  on sagittal image number 95 and coronal image number 25. Another differential consideration is tumor involving this portion of the small bowel, including the possibility of a primary small bowel tumor at the location of initial concentric narrowing.  These results were called by telephone at the time of interpretation on 09/08/2014 at 1:17 pm to Dr. Excell Seltzer , who verbally acknowledged these results.   Electronically Signed   By: Claudie Revering M.D.   On: 09/08/2014 13:17   09/08/2014   CLINICAL DATA:  Pattern on small bowel obstruction on radiographs earlier today. Abdominal distention. History of peritoneal adenocarcinomatosis with unknown primary. Undergoing chemotherapy.  EXAM: CT ABDOMEN AND PELVIS WITH CONTRAST  TECHNIQUE: Multidetector CT imaging of the abdomen and pelvis was performed using the standard protocol following bolus administration of intravenous contrast.  CONTRAST:  172mL OMNIPAQUE IOHEXOL 300 MG/ML  SOLN  COMPARISON:  Radiographs obtained yesterday. Abdomen and pelvis CT dated 06/30/2014.  FINDINGS: Interval small bilateral pleural effusions. There is also bilateral lower lobe, lingular and right middle lobe atelectasis.  Multiple dilated, contrast filled proximal small bowel loops as well as less dilated, fluid-filled mid small bowel loops. Normal caliber distal small bowel and colon. The duodenum is not dilated. There is swirling of the mesenteric vessels in the central abdomen, best seen on the sagittal reconstructed images (sagittal image number 69). There are also some small bowel loops in the left mid abdomen which are minimally dilated and demonstrate diffuse, low-density wall thickening, not previously present. No areas of vascular occlusion are seen.  Moderate amount of free peritoneal fluid with a significant increase. There is some loculated fluid lateral to the rectosigmoid conjunction on the left, not previously present. There is a suggestion of a small amount of vague soft  tissue density associated with this fluid collection, best seen on coronal image number 84. The fluid collection measures 5.1 x 3.2 cm on image number 92. The previously demonstrated peritoneal nodularity is not currently visualized. No enlarged lymph nodes.  And upper pole left renal cyst is unchanged. The liver, spleen, pancreas, adrenal glands, right kidney and urinary bladder unremarkable. Mildly enlarged prostate gland. Small bilateral inguinal  hernias containing fat. Nasogastric tube tip in the proximal stomach. Mildly dilated gallbladder containing several tiny gallstones that appear adherent to the gallbladder wall in the fundus. No gallbladder wall thickening. Lumbar and lower thoracic spine degenerative changes. No evidence of bony metastatic disease.  IMPRESSION: 1. Partial small bowel obstruction, most likely due to an internal hernia. 2. Moderate diffuse low density wall thickening involving small bowel loops in the left mid abdomen. This could be due to ischemia or lymphatic congestion associated with the small bowel obstruction. This could also be infectious or inflammatory in nature. 3. Moderate amount of ascites, increased. 4. Interval 5.1 x 3.2 cm loculated fluid collection lateral to the rectosigmoid junction on the left with subtle soft tissue components, suspicious for a peritoneal implant of metastatic disease. 5. Otherwise, improved peritoneal metastatic disease. 6. Interval small bilateral pleural effusions and bibasilar atelectasis. 7. Small bilateral inguinal hernias containing fat and previously noted small hiatal hernia. 8. Mildly enlarged prostate gland. These results will be called to the ordering clinician or representative by the Radiologist Assistant, and communication documented in the PACS or zVision Dashboard.  Electronically Signed: By: Claudie Revering M.D. On: 09/08/2014 13:03   US Paracentesis  08/25/2014   INDICATION: ascites  EXAM: ULTRASOUND-GUIDED PARACENTESIS  COMPARISON:   None.  MEDICATIONS: 10 cc 1% lidocaine  COMPLICATIONS: None immediate  TECHNIQUE: Informed written consent was obtained from the patient after a discussion of the risks, benefits and alternatives to treatment. A timeout was performed prior to the initiation of the procedure.  Initial ultrasound scanning demonstrates a moderate amount of ascites within the right lower abdominal quadrant. The right lower abdomen was prepped and draped in the usual sterile fashion. 1% lidocaine with epinephrine was used for local anesthesia. Under direct ultrasound guidance, a 19 gauge, 7-cm, Yueh catheter was introduced. An ultrasound image was saved for documentation purposed. The paracentesis was performed. The catheter was removed and a dressing was applied. The patient tolerated the procedure well without immediate post procedural complication.  FINDINGS: A total of approximately 2.8 liters of yellow fluid was removed. Samples were sent to the laboratory as requested by the clinical team.  IMPRESSION: Successful ultrasound-guided paracentesis yielding 2.8 liters of peritoneal fluid.  Read by:  Lavonia Drafts [facility]   Electronically Signed   By: Jerilynn Mages.  Shick M.D.   On: 08/25/2014 16:25   Dg Abd 2 Views  09/07/2014   CLINICAL DATA:  69 year old male with a history of abdominal distention. History of adenocarcinoma, with peritoneal disease.  EXAM: ABDOMEN - 2 VIEW  COMPARISON:  09/06/2014, 08/24/2014  FINDINGS: Similar pattern of multiple distended small bowel loops with a paucity of colonic gas.  Upright image demonstrates multiple air-fluid levels within small bowel.  Interval placement of gastric tube, which appears to terminate in the left upper quadrant in the region of the stomach.  No evidence of free air on this series.  No displaced fracture.  IMPRESSION: Persistence of dilated small bowel loops with air-fluid levels and paucity of colonic gas compatible with small bowel obstruction. Recommend serial x-ray follow-up of this  abnormal gas pattern, or if there is concern for further evaluation, CT abdomen.  Interval placement of gastric tube, which appears to terminate the stomach.  Signed,  Dulcy Fanny. Earleen Newport, DO  [facility]  [facility]   Electronically Signed   By: Corrie Mckusick D.O.   On: 09/07/2014 08:25   Dg Abd 2 Views  09/06/2014   CLINICAL DATA:  Abdominal distention and pain.  EXAM: ABDOMEN - 2 VIEW  COMPARISON:  Aug 24, 2014.  FINDINGS: Severe small bowel dilatation with air-fluid levels is noted concerning for distal small bowel obstruction. There is no evidence of pneumoperitoneum. Phleboliths are noted in the pelvis.  IMPRESSION: Severely dilated small bowel loops with air-fluid levels are noted concerning for distal small bowel obstruction.   Electronically Signed   By: Marijo Conception, M.D.   On: 09/06/2014 10:07   Dg Abd 2 Views  08/24/2014   CLINICAL DATA:  Abdominal distension  EXAM: ABDOMEN - 2 VIEW  COMPARISON:  08/22/2014  FINDINGS: A nasogastric catheter is been removed in the interval. Scattered large and small bowel gas is noted. Few mildly dilated loops of small bowel are noted in the mid abdomen. No free air is seen.  IMPRESSION: Interval removal of nasogastric catheter.  Mildly dilated central small bowel loops.   Electronically Signed   By: Inez Catalina M.D.   On: 08/24/2014 08:47   Dg Abd 2 Views  08/22/2014   CLINICAL DATA:  Abdominal distention.  Recent bowel obstruction.  EXAM: ABDOMEN - 2 VIEW  COMPARISON:  August 21, 2014  FINDINGS: Supine and upright images were obtained. There is much less bowel gas compared to 1 day prior. There is no bowel dilatation currently. There are scattered air-fluid levels. Nasogastric tube tip is in the proximal stomach with the side port slightly proximal to the gastroesophageal junction. No free air apparent. There are small phleboliths in the pelvis.  IMPRESSION: Considerably less small bowel gas compared to 1 day prior.  No bowel dilatation. Scattered air-fluid levels raise question of a residual degree of obstruction. This appearance also could be indicative of underlying enteritis.  The nasogastric tube tip is in the proximal stomach with the side port slightly above the gastroesophageal junction. Suggest advancing nasogastric tube approximately 6-7 cm to insure placement of both nasogastric tube tip and side port below the gastroesophageal junction.   Electronically Signed   By: Lowella Grip III M.D.   On: 08/22/2014 08:11   Dg Abd 2 Views  08/21/2014   CLINICAL DATA:  Small bowel obstruction  EXAM: ABDOMEN - 2 VIEW  COMPARISON:  08/20/2014  FINDINGS: Nasogastric tube tip terminates over the expected location of the body of the stomach. Multiple differential air-fluid levels are identified with small bowel dilatation measuring 6.2 cm maximally, increased from previously. Colon is decompressed. Mild leftward curvature of the lumbar spine centered at L3 is noted. Patchy bibasilar probable atelectasis noted. No free air beneath the diaphragms.  IMPRESSION: Increase in caliber of dilated small bowel loops compatible with mid small bowel obstruction.   Electronically Signed   By: Conchita Paris M.D.   On: 08/21/2014 08:49   Dg Abd 2 Views  08/20/2014   CLINICAL DATA:  Small bowel obstruction. Abdominal pain and distention. 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  EXAM: ABDOMEN - 2 VIEW  COMPARISON:  08/18/2014  FINDINGS: NG tube is now in place in the fundus of the stomach. Multiple stairstep air-fluid levels remain in the small bowel consistent with small bowel obstruction. There is a small amount of air in the nondistended colon.  Small bilateral pleural effusions.  IMPRESSION: Persistent small bowel obstruction, essentially unchanged.   Electronically Signed   By: Lorriane Shire M.D.   On: 08/20/2014 11:51    Microbiology: Recent Results (from the past 240 hour(s))  Surgical pcr screen      Status: None  Collection Time: 09/08/14 10:02 PM  Result Value Ref Range Status   MRSA, PCR NEGATIVE NEGATIVE Final   Staphylococcus aureus NEGATIVE NEGATIVE Final    Comment:        The Xpert SA Assay (FDA approved for NASAL specimens in patients over 56 years of age), is one component of a comprehensive surveillance program.  Test performance has been validated by [facility] for patients greater than or equal to 64 year old. It is not intended to diagnose infection nor to guide or monitor treatment.   Clostridium Difficile by PCR     Status: None   Collection Time: 09/15/14  3:03 AM  Result Value Ref Range Status   C difficile by pcr NEGATIVE NEGATIVE Final  Stool culture     Status: None (Preliminary result)   Collection Time: 09/17/14 10:18 AM  Result Value Ref Range Status   Specimen Description STOOL  Final   Special Requests NONE  Final   Culture   Final    Culture reincubated for better growth Performed at [facility]    Report Status PENDING  Incomplete  Clostridium Difficile by PCR     Status: None   Collection Time: 09/17/14 10:18 AM  Result Value Ref Range Status   C difficile by pcr NEGATIVE NEGATIVE Final     Labs: Basic Metabolic Panel:  Recent Labs Lab 09/14/14 0600 09/15/14 0357 09/16/14 0350 09/17/14 0509 09/18/14 0615  NA 130* 129* 127* 126* 123*  K 4.2 4.0 4.1 3.7 3.8  CL 95* 96* 96* 98* 96*  CO2 26 25 24  20* 19*  GLUCOSE 133* 126* 120* 125* 90  BUN 16 15 13 11 9   CREATININE 0.64 0.44* 0.61 0.51* 0.52*  CALCIUM 7.9* 8.0* 7.7* 7.5* 7.5*  MG 1.9  --   --  2.0  --   PHOS 3.1  --   --  2.7  --    Liver Function Tests:  Recent Labs Lab 09/14/14 0600 09/17/14 0509  AST 20 26  ALT 16* 26  ALKPHOS 133* 128*  BILITOT 0.6 1.2  PROT 5.4* 5.3*  ALBUMIN 1.8* 1.6*   No results for input(s): LIPASE, AMYLASE in the last 168 hours. No results for input(s): AMMONIA in the last 168 hours. CBC:  Recent Labs Lab 09/13/14 0411  09/14/14 0600 09/15/14 0357 09/16/14 0350 09/18/14 0615  WBC 15.9* 18.5* 16.9* 14.6* 15.0*  NEUTROABS  --  14.4*  --   --   --   HGB 8.6* 8.0* 7.6* 7.6* 7.8*  HCT 26.5* 25.4* 24.3* 23.1* 23.7*  MCV 86.6 86.4 86.2 86.2 84.0  PLT 280 302 333 380 553*   Cardiac Enzymes: No results for input(s): CKTOTAL, CKMB, CKMBINDEX, TROPONINI in the last 168 hours. BNP: BNP (last 3 results) No results for input(s): BNP in the last 8760 hours.  ProBNP (last 3 results) No results for input(s): PROBNP in the last 8760 hours.  CBG:  Recent Labs Lab 09/16/14 0006 09/16/14 0754 09/16/14 1608 09/17/14 0002 09/17/14 0829  GLUCAP 114* 101* 109* 125* 105*    Signed:  CHIU, STEPHEN K  [facility] 09/18/2014, 7:41 PM

## 2014-09-18 NOTE — Progress Notes (Signed)
Gen. 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 He is tolerating regular diet and states that he can drink as much fluid as he wishes. He still has the diarrhea and really hasn't slowed down that much, despite cholestyramine, probiotics, and Metamucil. C. difficile negative. Stool cultures pending. We may have to consider starting him on Imodium, cautiously.  His abdomen is soft. The distention has improved although he still has ascites. Midline wound looks clean.   Plan: DC staples and Steri-Strip wound today Discharge home today if okay with internal medicine.  Start Imodium cautiously if necessary.  Will give 1 dose as trial now. Return to see Dr. Excell Seltzer in one week Return to see Dr. [name] within one week He knows that he may have to come in to short stay or to the chemoinfusion area to get IV fluids if he gets dehydrated   Wesley Dillon. [name], M.D., [facility] General and Minimally invasive Surgery Breast and Colorectal Surgery Office:   [phone]

## 2014-09-18 NOTE — Progress Notes (Signed)
Nursing Discharge Summary  Patient ID: HASANI DIEMER MRN: [id number] DOB/AGE: 30-Aug-1945 69 y.o.  Admit date: 09/06/2014 Discharge date: 09/18/2014  Discharged Condition: good  Disposition: 01-Home or Self Care  Follow-up Information    Schedule an appointment as soon as possible for a visit with Edward Jolly, MD.   Specialty:  General Surgery   Why:  For post-operation check, call the office to confirm the date and time with Dr. Excell Seltzer, the office should already be working on a date/time.   Contact information:   [address] [place] [address] [phone]       Follow up with Gerrit Heck, MD. Schedule an appointment as soon as possible for a visit in 1 week.   Specialty:  Family Medicine   Why:  Hospital follow up   Contact information:   [place] [address] [phone]       Follow up with Betsy Coder, MD. Schedule an appointment as soon as possible for a visit in 1 week.   Specialty:  Oncology   Why:  Hospital follow up   Contact information:   [address] [place] [address] [phone]       Prescriptions Given: 3 prescriptions given. Patient follow up appointments and medications discussed. Both patient and wife verbalized understanding  Means of Discharge: patient to be taken downstairs via wheelchair to be discharged home.   Signed: Buel Ream 09/18/2014, 11:40 AM

## 2014-09-18 NOTE — Discharge Instructions (Signed)
[facility]      [facility] [phone]  OPEN ABDOMINAL SURGERY: POST OP INSTRUCTIONS  Always review your discharge instruction sheet given to you by the facility where your surgery was performed.  IF YOU HAVE DISABILITY OR FAMILY LEAVE FORMS, YOU MUST BRING THEM TO THE OFFICE FOR PROCESSING.  PLEASE DO NOT GIVE THEM TO YOUR DOCTOR.  1. A prescription for pain medication may be given to you upon discharge.  Take your pain medication as prescribed, if needed.  If narcotic pain medicine is not needed, then you may take acetaminophen (Tylenol) or ibuprofen (Advil) as needed. 2. Take your usually prescribed medications unless otherwise directed. 3. If you need a refill on your pain medication, please contact your pharmacy. They will contact our office to request authorization.  Prescriptions will not be filled after 5pm or on week-ends. 4. You should follow a light diet the first few days after arrival home, such as soup and crackers, pudding, etc.unless your doctor has advised otherwise. A high-fiber, low fat diet can be resumed as tolerated.   Be sure to include lots of fluids daily. Most patients will experience some swelling and bruising on the chest and neck area.  Ice packs will help.  Swelling and bruising can take several days to resolve 5. Most patients will experience some swelling and bruising in the area of the incision. Ice pack will help. Swelling and bruising can take several days to resolve..  6. It is common to experience some constipation if taking pain medication after surgery.  Increasing fluid intake and taking a stool softener will usually help or prevent this problem from occurring.  A mild laxative (Milk of Magnesia or Miralax) should be taken according to package directions if there are no bowel movements after 48 hours. 7.  You may have steri-strips (small skin tapes) in place directly over the incision.  These strips should be left on the skin for 7-10 days.  If your  surgeon used skin glue on the incision, you may shower in 24 hours.  The glue will flake off over the next 2-3 weeks.  Any sutures or staples will be removed at the office during your follow-up visit. You may find that a light gauze bandage over your incision may keep your staples from being rubbed or pulled. You may shower and replace the bandage daily. 8. ACTIVITIES:  You may resume regular (light) daily activities beginning the next day--such as daily self-care, walking, climbing stairs--gradually increasing activities as tolerated.  You may have sexual intercourse when it is comfortable.  Refrain from any heavy lifting or straining until approved by your doctor. a. You may drive when you no longer are taking prescription pain medication, you can comfortably wear a seatbelt, and you can safely maneuver your car and apply brakes b. Return to Work: ___________________________________ 9. You should see your doctor in the office for a follow-up appointment approximately two weeks after your surgery.  Make sure that you call for this appointment within a day or two after you arrive home to insure a convenient appointment time. OTHER INSTRUCTIONS:  _____________________________________________________________ _____________________________________________________________  WHEN TO CALL YOUR DOCTOR: 1. Fever over 101.0 2. Inability to urinate 3. Nausea and/or vomiting 4. Extreme swelling or bruising 5. Continued bleeding from incision. 6. Increased pain, redness, or drainage from the incision. 7. Difficulty swallowing or breathing 8. Muscle cramping or spasms. 9. Numbness or tingling in hands or feet or around lips.  The clinic staff is available to   answer your questions during regular business hours.  Please don't hesitate to call and ask to speak to one of the nurses if you have concerns.  For further questions, please visit [email or web address]   

## 2014-09-21 LAB — STOOL CULTURE

## 2014-09-23 ENCOUNTER — Telehealth: Payer: Self-pay | Admitting: *Deleted

## 2014-09-23 ENCOUNTER — Ambulatory Visit (HOSPITAL_BASED_OUTPATIENT_CLINIC_OR_DEPARTMENT_OTHER): Payer: 59

## 2014-09-23 ENCOUNTER — Ambulatory Visit: Payer: 59

## 2014-09-23 ENCOUNTER — Other Ambulatory Visit (HOSPITAL_BASED_OUTPATIENT_CLINIC_OR_DEPARTMENT_OTHER): Payer: 59

## 2014-09-23 ENCOUNTER — Ambulatory Visit (HOSPITAL_COMMUNITY)
Admission: RE | Admit: 2014-09-23 | Discharge: 2014-09-23 | Disposition: A | Discharge status: Home / Self Care | Payer: 59 | Source: Ambulatory Visit | Attending: Oncology | Admitting: Oncology

## 2014-09-23 ENCOUNTER — Ambulatory Visit (HOSPITAL_BASED_OUTPATIENT_CLINIC_OR_DEPARTMENT_OTHER): Payer: 59 | Admitting: Oncology

## 2014-09-23 VITALS — BP 97/61 | HR 75 | Temp 98.2°F | Resp 18 | Ht 73.0 in | Wt 170.6 lb

## 2014-09-23 VITALS — BP 106/59 | HR 68 | Temp 97.9°F | Resp 18

## 2014-09-23 DIAGNOSIS — C800 Disseminated malignant neoplasm, unspecified: Secondary | ICD-10-CM | POA: Diagnosis not present

## 2014-09-23 DIAGNOSIS — D649 Anemia, unspecified: Secondary | ICD-10-CM | POA: Diagnosis present

## 2014-09-23 DIAGNOSIS — C801 Malignant (primary) neoplasm, unspecified: Secondary | ICD-10-CM

## 2014-09-23 DIAGNOSIS — R630 Anorexia: Secondary | ICD-10-CM

## 2014-09-23 DIAGNOSIS — T8143XA Infection following a procedure, organ and space surgical site, initial encounter: Secondary | ICD-10-CM

## 2014-09-23 DIAGNOSIS — R5381 Other malaise: Secondary | ICD-10-CM

## 2014-09-23 DIAGNOSIS — A310 Pulmonary mycobacterial infection: Secondary | ICD-10-CM

## 2014-09-23 DIAGNOSIS — A419 Sepsis, unspecified organism: Secondary | ICD-10-CM

## 2014-09-23 DIAGNOSIS — K651 Peritoneal abscess: Secondary | ICD-10-CM

## 2014-09-23 DIAGNOSIS — Z95828 Presence of other vascular implants and grafts: Secondary | ICD-10-CM

## 2014-09-23 DIAGNOSIS — E8809 Other disorders of plasma-protein metabolism, not elsewhere classified: Secondary | ICD-10-CM

## 2014-09-23 HISTORY — DX: Sepsis, unspecified organism: A41.9

## 2014-09-23 HISTORY — DX: Infection following a procedure, organ and space surgical site, initial encounter: T81.43XA

## 2014-09-23 HISTORY — DX: Other disorders of plasma-protein metabolism, not elsewhere classified: E88.09

## 2014-09-23 HISTORY — DX: Pulmonary mycobacterial infection: A31.0

## 2014-09-23 HISTORY — DX: Peritoneal abscess: K65.1

## 2014-09-23 LAB — COMPREHENSIVE METABOLIC PANEL (CC13)
ALT: 14 U/L (ref 0–55)
AST: 24 U/L (ref 5–34)
Albumin: 1.2 g/dL — ABNORMAL LOW (ref 3.5–5.0)
Alkaline Phosphatase: 244 U/L — ABNORMAL HIGH (ref 40–150)
Anion Gap: 11 mEq/L (ref 3–11)
BUN: 8.4 mg/dL (ref 7.0–26.0)
CO2: 20 meq/L — AB (ref 22–29)
Calcium: 7.8 mg/dL — ABNORMAL LOW (ref 8.4–10.4)
Chloride: 96 mEq/L — ABNORMAL LOW (ref 98–109)
Creatinine: 0.6 mg/dL — ABNORMAL LOW (ref 0.7–1.3)
EGFR: >90 mL/min/{1.73_m2} (ref >90)
GLUCOSE: 120 mg/dL (ref 70–140)
POTASSIUM: 3.3 meq/L — AB (ref 3.5–5.1)
Sodium: 126 mEq/L — ABNORMAL LOW (ref 136–145)
TOTAL PROTEIN: 5.9 g/dL — AB (ref 6.4–8.3)
Total Bilirubin: 1.08 mg/dL (ref 0.20–1.20)

## 2014-09-23 LAB — CBC WITH DIFFERENTIAL/PLATELET
BASO%: 0.4 % (ref 0.0–2.0)
Basophils Absolute: 0.1 10*3/uL (ref 0.0–0.1)
EOS ABS: 0 10*3/uL (ref 0.0–0.5)
EOS%: 0 % (ref 0.0–7.0)
HCT: 23.9 % — ABNORMAL LOW (ref 38.4–49.9)
HEMOGLOBIN: 7.7 g/dL — AB (ref 13.0–17.1)
LYMPH%: 4.5 % — ABNORMAL LOW (ref 14.0–49.0)
MCH: 26.6 pg — ABNORMAL LOW (ref 27.2–33.4)
MCHC: 32.2 g/dL (ref 32.0–36.0)
MCV: 82.6 fL (ref 79.3–98.0)
MONO#: 1.4 10*3/uL — ABNORMAL HIGH (ref 0.1–0.9)
MONO%: 7.9 % (ref 0.0–14.0)
NEUT#: 15.5 10*3/uL — ABNORMAL HIGH (ref 1.5–6.5)
NEUT%: 87.2 % — ABNORMAL HIGH (ref 39.0–75.0)
Platelets: 627 10*3/uL — ABNORMAL HIGH (ref 140–400)
RBC: 2.89 10*6/uL — AB (ref 4.20–5.82)
RDW: 18.9 % — ABNORMAL HIGH (ref 11.0–14.6)
WBC: 17.7 10*3/uL — ABNORMAL HIGH (ref 4.0–10.3)
lymph#: 0.8 10*3/uL — ABNORMAL LOW (ref 0.9–3.3)

## 2014-09-23 LAB — PREPARE RBC (CROSSMATCH)

## 2014-09-23 LAB — HOLD TUBE, BLOOD BANK

## 2014-09-23 MED ORDER — SODIUM CHLORIDE 0.9 % IJ SOLN
10.0000 mL | INTRAMUSCULAR | Status: AC | PRN
  Administered 2014-09-23: 10 mL
  Filled 2014-09-23: qty 10

## 2014-09-23 MED ORDER — SODIUM CHLORIDE 0.9 % IJ SOLN
10.0000 mL | INTRAMUSCULAR | Status: DC | PRN
  Administered 2014-09-23: 10 mL via INTRAVENOUS
  Filled 2014-09-23: qty 10

## 2014-09-23 MED ORDER — PREDNISONE 10 MG (21) PO TBPK: 10.0000 mg | ORAL_TABLET | Freq: Every day | ORAL | Status: DC

## 2014-09-23 MED ORDER — HEPARIN SOD (PORK) LOCK FLUSH 100 UNIT/ML IV SOLN
500.0000 [IU] | Freq: Once | INTRAVENOUS | Status: AC
  Administered 2014-09-23: 500 [IU] via INTRAVENOUS
  Filled 2014-09-23: qty 5

## 2014-09-23 MED ORDER — HEPARIN SOD (PORK) LOCK FLUSH 100 UNIT/ML IV SOLN
500.0000 [IU] | Freq: Every day | INTRAVENOUS | Status: AC | PRN
  Administered 2014-09-23: 500 [IU]
  Filled 2014-09-23: qty 5

## 2014-09-23 MED ORDER — SODIUM CHLORIDE 0.9 % IV SOLN
250.0000 mL | Freq: Once | INTRAVENOUS | Status: AC
  Administered 2014-09-23: 250 mL via INTRAVENOUS

## 2014-09-23 MED ORDER — PROPRANOLOL HCL 10 MG PO TABS: 10.0000 mg | ORAL_TABLET | Freq: Two times a day (BID) | ORAL | Status: DC

## 2014-09-23 NOTE — Progress Notes (Signed)
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 He was taken the operating room 09/09/2014. A mass was noted involving the distal right colon and pericolonic fat. There was a high-grade obstruction approximate 2 feet from the ileocecal valve. The terminal ileum and right colon were resected. Another area of high-grade obstruction was noted 3 feet proximal to the anastomosis. This area was bypassed.  He had diarrhea following surgery. This has improved. He was discharged 09/18/2014. Since discharge from the hospital he has developed anorexia and malaise. He has difficulty ambulating secondary to weakness. No pain or nausea. He has approximately 2 bowel movements per day and takes Imodium.  Objective:  Vital signs in last 24 hours:  Blood pressure 97/61, pulse 75, temperature 98.2 F (36.8 C), temperature source Oral, resp. rate 18, height 6\' 1"  (1.854 m), weight 170 lb 9.6 oz (77.384 kg), SpO2 95 %.    HEENT: The mucous membranes are moist, mild whitecoat over the tongue Resp: Decreased breath sounds at the bases, no respiratory distress Cardio: Regular rate and rhythm GI: Mildly distended, nontender, no mass, healed midline incision Vascular: Pitting edema at the lower leg and ankles bilaterally  Portacath/PICC-without erythema  Lab Results:  Lab Results  Component Value Date   WBC 17.7* 09/23/2014   HGB 7.7* 09/23/2014   HCT 23.9* 09/23/2014   MCV 82.6 09/23/2014   PLT 627* 09/23/2014   NEUTROABS 15.5* 09/23/2014     Lab Results  Component Value Date   CEA 3.0 07/01/2014    Medications: I have reviewed the patient's current medications.  Assessment/Plan: 1. 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  CT 06/30/2014 consistent with a partial small bowel obstruction and  abdominal carcinomatosis  Omentum biopsy 07/01/2014 confirmed adenocarcinoma, nonspecific staining pattern  PET scan 07/07/2014 with multiple areas of hypermetabolic bowel wall thickening consistent with serosal implants from carcinomatosis, thickening and hypermetabolic activity at the terminal ileum felt to potentially represent a primary neoplasm, solitary right liver metastasis, hypermetabolic nodular lung lesions  Cycle 1 FOLFOX 07/15/2014  Cycle 2 FOLFOX 07/28/2014  Cycle 3 FOLFOX 08/11/2014  Cycle 4 FOLFOX 08/25/2014  2. Abdominal pain secondary to #1, improved 3. Iron deficiency, Hemoccult positive stool 4. Partial small bowel obstruction-recurrent bowel obstruction symptoms requiring hospital admission 08/18/2014 5. Bronchiectasis 6. 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 Nausea following cycle 1 FOLFOX, Emend and Aloxi added with cycle 2 8. Neutropenia secondary to chemotherapy-Neulasta will be added with cycle 4 FOLFOX 9. Admission 09/06/2014 with a small bowel obstruction, status post resection of the terminal ileum/right colon and small bowel bypass 09/09/2014  Pathology confirmed diffuse involvement of the small bowel, large bowel, and appendix with adenocarcinoma-no primary tumor site found 10. 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 He is not a candidate for further chemotherapy in his current condition. I plan had been to continue FOLFOX chemotherapy when he recovers from surgery.  I am concerned his symptoms are related to the metastatic tumor burden. He will discontinue Lipitor and we decreased the propranolol dose. He will receive 2 units of packed red blood cells today. He will begin prednisone as an appetite stimulant.  We prescribed a handicap sticker and home medical equipment  today. We will  ask [facility] to evaluate him for home physical therapy.  [name] will return for an office visit 09/29/2014.  Betsy Coder, MD  09/23/2014  10:20 AM

## 2014-09-23 NOTE — [facility] Oncology Navigator Note (Signed)
Oncology Nurse Navigator Documentation  Oncology Nurse Navigator Flowsheets 09/23/2014  Navigator Encounter Type Hospital F/U  Patient Visit Type Medonc  Treatment Phase S/P hospitalization  Barriers/Navigation Needs Family concerns-Weakness, fall risk,appetite--ordered [facility] Referral for PT and W/C, rolling walker, shower seat. Contacted [facility], Lurlean Leyden to follow up on orders. MD starting him on Prednisone for appetite and will reduce dose of Inderal. Reviewed fall precautions with him and family.  Time Spent with Patient 30

## 2014-09-23 NOTE — Patient Instructions (Signed)

## 2014-09-23 NOTE — Telephone Encounter (Signed)
Called pt with lab results. Sodium and potassium are low- per Dr. [name]. Increase fluid intake. Will check lab 6/7. Pt voiced understanding.

## 2014-09-23 NOTE — Telephone Encounter (Signed)
-----   Message from Ladell Pier, MD sent at 09/23/2014  4:10 PM EDT ----- Please call patient , sodium and potassium are low,increased fluid intake, repeat bmet 6/7

## 2014-09-23 NOTE — Patient Instructions (Signed)

## 2014-09-24 ENCOUNTER — Telehealth: Payer: Self-pay | Admitting: *Deleted

## 2014-09-24 LAB — TYPE AND SCREEN
ABO/RH(D): B POS
ANTIBODY SCREEN: NEGATIVE
UNIT DIVISION: 0
Unit division: 0

## 2014-09-24 NOTE — Telephone Encounter (Signed)
Message from Nina, [place] with [facility] requesting order for transport chair instead of wheelchair. OK, per Dr. [name]. Verbal order called to [facility].

## 2014-09-24 NOTE — Telephone Encounter (Signed)
Oncology Nurse Navigator Documentation  Oncology Nurse Navigator Flowsheets 09/23/2014 09/24/2014  Navigator Encounter Type Other Telephone-F/U on DME  Patient Visit Type Medonc -  Treatment Phase Other -  Barriers/Navigation Needs Family concerns -  Specialty Items/DME - Confirmed w/wife that DME is being delivered today   Time Spent with Patient 30 2

## 2014-09-25 ENCOUNTER — Telehealth: Payer: Self-pay | Admitting: *Deleted

## 2014-09-25 ENCOUNTER — Ambulatory Visit: Payer: 59

## 2014-09-25 ENCOUNTER — Other Ambulatory Visit: Payer: Self-pay | Admitting: *Deleted

## 2014-09-25 DIAGNOSIS — D649 Anemia, unspecified: Secondary | ICD-10-CM

## 2014-09-25 DIAGNOSIS — E43 Unspecified severe protein-calorie malnutrition: Secondary | ICD-10-CM

## 2014-09-25 DIAGNOSIS — C800 Disseminated malignant neoplasm, unspecified: Secondary | ICD-10-CM

## 2014-09-25 NOTE — Telephone Encounter (Signed)
SPOKE WITH SUSAN AT [facility]. THE ORDER HAS BEEN CHANGED TO A TRANSPORT CHAIR. IT WILL BE DELIVERED AS SOON AS POSSIBLE. NOTIFIED PT.'S WIFE.

## 2014-09-29 ENCOUNTER — Ambulatory Visit: Payer: 59 | Admitting: Oncology

## 2014-09-29 ENCOUNTER — Other Ambulatory Visit: Payer: 59

## 2014-09-30 ENCOUNTER — Telehealth: Payer: Self-pay | Admitting: *Deleted

## 2014-09-30 DIAGNOSIS — C801 Malignant (primary) neoplasm, unspecified: Secondary | ICD-10-CM

## 2014-09-30 DIAGNOSIS — C800 Disseminated malignant neoplasm, unspecified: Secondary | ICD-10-CM

## 2014-09-30 NOTE — Addendum Note (Signed)
Addended by: Domenic Schwab on: 09/30/2014 11:56 AM   Modules accepted: Orders

## 2014-09-30 NOTE — Telephone Encounter (Signed)
Notified pt's wife of new appt Monday 6/13 lab 2 and MD at [facility].  Pt's wife verbalized understanding and expressed appreciation for call back.

## 2014-09-30 NOTE — Telephone Encounter (Signed)
Magda Paganini called requesting "confirmation of date of death for this patient.  I'm trying to complete a claim.  Was notified of his death and awaiting death certificate.  Don't want to call the home if this is true."  Per [facility] records patient is still amongst the living and rescheduled for F/U on 10-05-2014 with Dr. [name].

## 2014-09-30 NOTE — Telephone Encounter (Signed)
Per Dr. [name]; spoke with pt's wife re: missed appt Monday 09/28/14.  "I'm sorry; we got mixed up; just forgot"  Pt's wife states they are going to wedding Saturday and request call back re: new date/time.  Update from wife re: pt status, "he is very weak, abdomin bloated, ankles swelling and his appetitie is very low; continues to have some diarrhea"  Informed pt's wife that office will make Dr. [name] aware on statues and will call back with new date/time for appt.  Pt's wife verbalized understanding and expressed appreciation.

## 2014-10-05 ENCOUNTER — Other Ambulatory Visit: Payer: Self-pay | Admitting: *Deleted

## 2014-10-05 ENCOUNTER — Encounter (HOSPITAL_COMMUNITY): Payer: Self-pay | Admitting: *Deleted

## 2014-10-05 ENCOUNTER — Inpatient Hospital Stay (HOSPITAL_COMMUNITY): Payer: 59

## 2014-10-05 ENCOUNTER — Inpatient Hospital Stay (HOSPITAL_COMMUNITY)
Admission: EM | Admit: 2014-10-05 | Discharge: 2014-10-10 | DRG: 862 | Disposition: A | Discharge status: Home / Self Care | Payer: 59 | Attending: Internal Medicine | Admitting: Internal Medicine

## 2014-10-05 ENCOUNTER — Ambulatory Visit (HOSPITAL_BASED_OUTPATIENT_CLINIC_OR_DEPARTMENT_OTHER): Payer: 59 | Admitting: Oncology

## 2014-10-05 ENCOUNTER — Other Ambulatory Visit: Payer: Self-pay

## 2014-10-05 ENCOUNTER — Telehealth: Payer: Self-pay | Admitting: *Deleted

## 2014-10-05 ENCOUNTER — Emergency Department (HOSPITAL_COMMUNITY): Payer: 59

## 2014-10-05 ENCOUNTER — Other Ambulatory Visit (HOSPITAL_BASED_OUTPATIENT_CLINIC_OR_DEPARTMENT_OTHER): Payer: 59

## 2014-10-05 VITALS — BP 99/63 | HR 86 | Temp 98.0°F | Resp 18 | Ht 73.0 in | Wt 173.4 lb

## 2014-10-05 DIAGNOSIS — K529 Noninfective gastroenteritis and colitis, unspecified: Secondary | ICD-10-CM | POA: Diagnosis present

## 2014-10-05 DIAGNOSIS — L03311 Cellulitis of abdominal wall: Secondary | ICD-10-CM

## 2014-10-05 DIAGNOSIS — I2510 Atherosclerotic heart disease of native coronary artery without angina pectoris: Secondary | ICD-10-CM | POA: Diagnosis present

## 2014-10-05 DIAGNOSIS — D509 Iron deficiency anemia, unspecified: Secondary | ICD-10-CM | POA: Diagnosis not present

## 2014-10-05 DIAGNOSIS — T814XXA Infection following a procedure, initial encounter: Secondary | ICD-10-CM | POA: Diagnosis present

## 2014-10-05 DIAGNOSIS — R6520 Severe sepsis without septic shock: Secondary | ICD-10-CM | POA: Diagnosis present

## 2014-10-05 DIAGNOSIS — R630 Anorexia: Secondary | ICD-10-CM

## 2014-10-05 DIAGNOSIS — K5660 Unspecified intestinal obstruction: Secondary | ICD-10-CM | POA: Diagnosis present

## 2014-10-05 DIAGNOSIS — D638 Anemia in other chronic diseases classified elsewhere: Secondary | ICD-10-CM | POA: Diagnosis present

## 2014-10-05 DIAGNOSIS — Z9221 Personal history of antineoplastic chemotherapy: Secondary | ICD-10-CM | POA: Diagnosis not present

## 2014-10-05 DIAGNOSIS — R509 Fever, unspecified: Secondary | ICD-10-CM | POA: Diagnosis present

## 2014-10-05 DIAGNOSIS — J189 Pneumonia, unspecified organism: Secondary | ICD-10-CM | POA: Diagnosis present

## 2014-10-05 DIAGNOSIS — Z6823 Body mass index (BMI) 23.0-23.9, adult: Secondary | ICD-10-CM | POA: Diagnosis not present

## 2014-10-05 DIAGNOSIS — Z933 Colostomy status: Secondary | ICD-10-CM | POA: Diagnosis not present

## 2014-10-05 DIAGNOSIS — L0390 Cellulitis, unspecified: Secondary | ICD-10-CM | POA: Diagnosis present

## 2014-10-05 DIAGNOSIS — R188 Other ascites: Secondary | ICD-10-CM | POA: Diagnosis present

## 2014-10-05 DIAGNOSIS — R197 Diarrhea, unspecified: Secondary | ICD-10-CM

## 2014-10-05 DIAGNOSIS — R251 Tremor, unspecified: Secondary | ICD-10-CM | POA: Diagnosis present

## 2014-10-05 DIAGNOSIS — E43 Unspecified severe protein-calorie malnutrition: Secondary | ICD-10-CM | POA: Diagnosis present

## 2014-10-05 DIAGNOSIS — E875 Hyperkalemia: Secondary | ICD-10-CM | POA: Diagnosis present

## 2014-10-05 DIAGNOSIS — C800 Disseminated malignant neoplasm, unspecified: Secondary | ICD-10-CM | POA: Diagnosis not present

## 2014-10-05 DIAGNOSIS — C801 Malignant (primary) neoplasm, unspecified: Secondary | ICD-10-CM

## 2014-10-05 DIAGNOSIS — R109 Unspecified abdominal pain: Secondary | ICD-10-CM

## 2014-10-05 DIAGNOSIS — G250 Essential tremor: Secondary | ICD-10-CM | POA: Diagnosis present

## 2014-10-05 DIAGNOSIS — I10 Essential (primary) hypertension: Secondary | ICD-10-CM | POA: Diagnosis present

## 2014-10-05 DIAGNOSIS — Z85038 Personal history of other malignant neoplasm of large intestine: Secondary | ICD-10-CM

## 2014-10-05 DIAGNOSIS — R600 Localized edema: Secondary | ICD-10-CM

## 2014-10-05 DIAGNOSIS — L539 Erythematous condition, unspecified: Secondary | ICD-10-CM

## 2014-10-05 DIAGNOSIS — E8809 Other disorders of plasma-protein metabolism, not elsewhere classified: Secondary | ICD-10-CM | POA: Diagnosis present

## 2014-10-05 DIAGNOSIS — R0600 Dyspnea, unspecified: Secondary | ICD-10-CM | POA: Diagnosis not present

## 2014-10-05 DIAGNOSIS — Y69 Unspecified misadventure during surgical and medical care: Secondary | ICD-10-CM | POA: Diagnosis present

## 2014-10-05 DIAGNOSIS — H3530 Unspecified macular degeneration: Secondary | ICD-10-CM | POA: Diagnosis present

## 2014-10-05 DIAGNOSIS — R778 Other specified abnormalities of plasma proteins: Secondary | ICD-10-CM | POA: Diagnosis present

## 2014-10-05 DIAGNOSIS — Z9049 Acquired absence of other specified parts of digestive tract: Secondary | ICD-10-CM | POA: Diagnosis present

## 2014-10-05 DIAGNOSIS — A310 Pulmonary mycobacterial infection: Secondary | ICD-10-CM | POA: Diagnosis present

## 2014-10-05 DIAGNOSIS — E860 Dehydration: Secondary | ICD-10-CM | POA: Diagnosis present

## 2014-10-05 DIAGNOSIS — K219 Gastro-esophageal reflux disease without esophagitis: Secondary | ICD-10-CM | POA: Diagnosis present

## 2014-10-05 DIAGNOSIS — T8149XA Infection following a procedure, other surgical site, initial encounter: Secondary | ICD-10-CM

## 2014-10-05 DIAGNOSIS — A419 Sepsis, unspecified organism: Secondary | ICD-10-CM | POA: Diagnosis present

## 2014-10-05 DIAGNOSIS — E876 Hypokalemia: Secondary | ICD-10-CM | POA: Diagnosis present

## 2014-10-05 DIAGNOSIS — D6481 Anemia due to antineoplastic chemotherapy: Secondary | ICD-10-CM | POA: Diagnosis not present

## 2014-10-05 DIAGNOSIS — K651 Peritoneal abscess: Secondary | ICD-10-CM | POA: Diagnosis not present

## 2014-10-05 DIAGNOSIS — R7989 Other specified abnormal findings of blood chemistry: Secondary | ICD-10-CM | POA: Diagnosis not present

## 2014-10-05 DIAGNOSIS — E871 Hypo-osmolality and hyponatremia: Secondary | ICD-10-CM | POA: Diagnosis present

## 2014-10-05 DIAGNOSIS — I248 Other forms of acute ischemic heart disease: Secondary | ICD-10-CM | POA: Diagnosis present

## 2014-10-05 DIAGNOSIS — E46 Unspecified protein-calorie malnutrition: Secondary | ICD-10-CM

## 2014-10-05 DIAGNOSIS — L02211 Cutaneous abscess of abdominal wall: Secondary | ICD-10-CM | POA: Diagnosis present

## 2014-10-05 DIAGNOSIS — T8143XA Infection following a procedure, organ and space surgical site, initial encounter: Secondary | ICD-10-CM

## 2014-10-05 DIAGNOSIS — R140 Abdominal distension (gaseous): Secondary | ICD-10-CM

## 2014-10-05 DIAGNOSIS — T814XXD Infection following a procedure, subsequent encounter: Secondary | ICD-10-CM | POA: Diagnosis not present

## 2014-10-05 DIAGNOSIS — I7121 Aneurysm of the ascending aorta, without rupture: Secondary | ICD-10-CM | POA: Diagnosis present

## 2014-10-05 DIAGNOSIS — R0902 Hypoxemia: Secondary | ICD-10-CM

## 2014-10-05 DIAGNOSIS — E785 Hyperlipidemia, unspecified: Secondary | ICD-10-CM | POA: Diagnosis present

## 2014-10-05 DIAGNOSIS — J479 Bronchiectasis, uncomplicated: Secondary | ICD-10-CM | POA: Diagnosis present

## 2014-10-05 DIAGNOSIS — K56609 Unspecified intestinal obstruction, unspecified as to partial versus complete obstruction: Secondary | ICD-10-CM | POA: Diagnosis present

## 2014-10-05 DIAGNOSIS — I712 Thoracic aortic aneurysm, without rupture: Secondary | ICD-10-CM | POA: Diagnosis not present

## 2014-10-05 HISTORY — PX: ABCESS DRAINAGE: SHX399

## 2014-10-05 LAB — COMPREHENSIVE METABOLIC PANEL (CC13)
ALBUMIN: 1.3 g/dL — AB (ref 3.5–5.0)
ALT: 17 U/L (ref 0–55)
AST: 20 U/L (ref 5–34)
Alkaline Phosphatase: 165 U/L — ABNORMAL HIGH (ref 40–150)
Anion Gap: 12 mEq/L — ABNORMAL HIGH (ref 3–11)
BUN: 7.3 mg/dL (ref 7.0–26.0)
CALCIUM: 7.6 mg/dL — AB (ref 8.4–10.4)
CHLORIDE: 95 meq/L — AB (ref 98–109)
CO2: 20 mEq/L — ABNORMAL LOW (ref 22–29)
Creatinine: 0.6 mg/dL — ABNORMAL LOW (ref 0.7–1.3)
EGFR: >90 mL/min/{1.73_m2} (ref >90)
Glucose: 97 mg/dl (ref 70–140)
Potassium: 3.4 mEq/L — ABNORMAL LOW (ref 3.5–5.1)
SODIUM: 127 meq/L — AB (ref 136–145)
Total Bilirubin: 0.72 mg/dL (ref 0.20–1.20)
Total Protein: 6 g/dL — ABNORMAL LOW (ref 6.4–8.3)

## 2014-10-05 LAB — CBC WITH DIFFERENTIAL/PLATELET
BASO%: 0 % (ref 0.0–2.0)
Basophils Absolute: 0 10*3/uL (ref 0.0–0.1)
Basophils Absolute: 0 10*3/uL (ref 0.0–0.1)
Basophils Relative: 0 % (ref 0–1)
EOS%: 0 % (ref 0.0–7.0)
Eosinophils Absolute: 0 10*3/uL (ref 0.0–0.5)
Eosinophils Absolute: 0 10*3/uL (ref 0.0–0.7)
Eosinophils Relative: 0 % (ref 0–5)
HCT: 26.3 % — ABNORMAL LOW (ref 39.0–52.0)
HEMATOCRIT: 27.2 % — AB (ref 38.4–49.9)
HEMOGLOBIN: 8.6 g/dL — AB (ref 13.0–17.0)
HGB: 8.8 g/dL — ABNORMAL LOW (ref 13.0–17.1)
LYMPH%: 4.1 % — ABNORMAL LOW (ref 14.0–49.0)
LYMPHS PCT: 3 % — AB (ref 12–46)
Lymphs Abs: 0.6 10*3/uL — ABNORMAL LOW (ref 0.7–4.0)
MCH: 26.7 pg — ABNORMAL LOW (ref 27.2–33.4)
MCH: 27.4 pg (ref 26.0–34.0)
MCHC: 32.4 g/dL (ref 32.0–36.0)
MCHC: 32.7 g/dL (ref 30.0–36.0)
MCV: 82.4 fL (ref 79.3–98.0)
MCV: 83.8 fL (ref 78.0–100.0)
MONO#: 1.2 10*3/uL — ABNORMAL HIGH (ref 0.1–0.9)
MONO%: 6 % (ref 0.0–14.0)
Monocytes Absolute: 0.8 10*3/uL (ref 0.1–1.0)
Monocytes Relative: 4 % (ref 3–12)
NEUT#: 18.2 10*3/uL — ABNORMAL HIGH (ref 1.5–6.5)
NEUT%: 89.9 % — ABNORMAL HIGH (ref 39.0–75.0)
NEUTROS PCT: 93 % — AB (ref 43–77)
Neutro Abs: 19.1 10*3/uL — ABNORMAL HIGH (ref 1.7–7.7)
PLATELETS: 378 10*3/uL (ref 140–400)
PLATELETS: 434 10*3/uL — AB (ref 150–400)
RBC: 3.3 10*6/uL — AB (ref 4.20–5.82)
RBC: 3.14 MIL/uL — ABNORMAL LOW (ref 4.22–5.81)
RDW: 17.3 % — ABNORMAL HIGH (ref 11.0–14.6)
RDW: 17.6 % — ABNORMAL HIGH (ref 11.5–15.5)
WBC: 20.3 10*3/uL — ABNORMAL HIGH (ref 4.0–10.3)
WBC: 20.5 10*3/uL — ABNORMAL HIGH (ref 4.0–10.5)
lymph#: 0.8 10*3/uL — ABNORMAL LOW (ref 0.9–3.3)

## 2014-10-05 LAB — I-STAT TROPONIN, ED: Troponin i, poc: 0.87 ng/mL (ref 0.00–0.08)

## 2014-10-05 LAB — LACTIC ACID, PLASMA: LACTIC ACID, VENOUS: 1.7 mmol/L (ref 0.5–2.0)

## 2014-10-05 LAB — I-STAT CG4 LACTIC ACID, ED
LACTIC ACID, VENOUS: 1.67 mmol/L (ref 0.5–2.0)
Lactic Acid, Venous: 2.13 mmol/L (ref 0.5–2.0)

## 2014-10-05 LAB — I-STAT CHEM 8, ED
BUN: 7 mg/dL (ref 6–20)
CHLORIDE: 94 mmol/L — AB (ref 101–111)
Calcium, Ion: 1.03 mmol/L — ABNORMAL LOW (ref 1.13–1.30)
Creatinine, Ser: 0.5 mg/dL — ABNORMAL LOW (ref 0.61–1.24)
GLUCOSE: 95 mg/dL (ref 65–99)
HCT: 29 % — ABNORMAL LOW (ref 39.0–52.0)
HEMOGLOBIN: 9.9 g/dL — AB (ref 13.0–17.0)
Potassium: 3.6 mmol/L (ref 3.5–5.1)
SODIUM: 123 mmol/L — AB (ref 135–145)
TCO2: 20 mmol/L (ref 0–100)

## 2014-10-05 LAB — APTT: aPTT: 42 seconds — ABNORMAL HIGH (ref 24–37)

## 2014-10-05 LAB — CLOSTRIDIUM DIFFICILE BY PCR: CDIFFPCR: NEGATIVE

## 2014-10-05 LAB — PROTIME-INR
INR: 1.47 (ref 0.00–1.49)
Prothrombin Time: 17.9 seconds — ABNORMAL HIGH (ref 11.6–15.2)

## 2014-10-05 LAB — PROCALCITONIN: Procalcitonin: 2.12 ng/mL

## 2014-10-05 LAB — D-DIMER, QUANTITATIVE: D-Dimer, Quant: 9.76 ug/mL-FEU — ABNORMAL HIGH (ref 0.00–0.48)

## 2014-10-05 MED ORDER — IOHEXOL 300 MG/ML  SOLN
50.0000 mL | Freq: Once | INTRAMUSCULAR | Status: AC | PRN
  Administered 2014-10-05: 50 mL via ORAL

## 2014-10-05 MED ORDER — VANCOMYCIN HCL IN DEXTROSE 1-5 GM/200ML-% IV SOLN
1000.0000 mg | Freq: Once | INTRAVENOUS | Status: AC
  Administered 2014-10-05: 1000 mg via INTRAVENOUS
  Filled 2014-10-05: qty 200

## 2014-10-05 MED ORDER — FLUCONAZOLE 100 MG PO TABS: 100.0000 mg | ORAL_TABLET | Freq: Every day | ORAL | Status: DC

## 2014-10-05 MED ORDER — VANCOMYCIN HCL IN DEXTROSE 1-5 GM/200ML-% IV SOLN
1000.0000 mg | Freq: Two times a day (BID) | INTRAVENOUS | Status: AC
  Administered 2014-10-06 – 2014-10-07 (×4): 1000 mg via INTRAVENOUS
  Filled 2014-10-05 (×4): qty 200

## 2014-10-05 MED ORDER — MEGESTROL ACETATE 40 MG/ML PO SUSP
200.0000 mg | Freq: Two times a day (BID) | ORAL | Status: DC
  Administered 2014-10-05 – 2014-10-08 (×6): 200 mg via ORAL
  Filled 2014-10-05 (×7): qty 5

## 2014-10-05 MED ORDER — MORPHINE SULFATE 4 MG/ML IJ SOLN
INTRAMUSCULAR | Status: AC
  Filled 2014-10-05: qty 1

## 2014-10-05 MED ORDER — MEGESTROL ACETATE 40 MG/ML PO SUSP: 200.0000 mg | Freq: Two times a day (BID) | ORAL | Status: DC

## 2014-10-05 MED ORDER — SACCHAROMYCES BOULARDII 250 MG PO CAPS
250.0000 mg | ORAL_CAPSULE | Freq: Two times a day (BID) | ORAL | Status: DC
  Administered 2014-10-05 – 2014-10-10 (×10): 250 mg via ORAL
  Filled 2014-10-05 (×10): qty 1

## 2014-10-05 MED ORDER — DOXYCYCLINE HYCLATE 100 MG PO TABS: 100.0000 mg | ORAL_TABLET | Freq: Two times a day (BID) | ORAL | Status: DC

## 2014-10-05 MED ORDER — MIDAZOLAM HCL 2 MG/2ML IJ SOLN: 1.0000 mg | Freq: Once | INTRAMUSCULAR | Status: DC

## 2014-10-05 MED ORDER — DIPHENOXYLATE-ATROPINE 2.5-0.025 MG PO TABS: 1.0000 | ORAL_TABLET | Freq: Four times a day (QID) | ORAL | Status: DC | PRN

## 2014-10-05 MED ORDER — MIDAZOLAM HCL 2 MG/2ML IJ SOLN
INTRAMUSCULAR | Status: AC
  Administered 2014-10-05: 1 mg
  Filled 2014-10-05: qty 2

## 2014-10-05 MED ORDER — SODIUM CHLORIDE 0.9 % IV SOLN: INTRAVENOUS | Status: DC

## 2014-10-05 MED ORDER — PIPERACILLIN-TAZOBACTAM 3.375 G IVPB 30 MIN
3.3750 g | Freq: Once | INTRAVENOUS | Status: AC
  Administered 2014-10-05: 3.375 g via INTRAVENOUS
  Filled 2014-10-05: qty 50

## 2014-10-05 MED ORDER — IOHEXOL 350 MG/ML SOLN
100.0000 mL | Freq: Once | INTRAVENOUS | Status: AC | PRN
  Administered 2014-10-05: 100 mL via INTRAVENOUS

## 2014-10-05 MED ORDER — SODIUM CHLORIDE 0.9 % IV BOLUS (SEPSIS)
1000.0000 mL | Freq: Once | INTRAVENOUS | Status: AC
  Administered 2014-10-05: 1000 mL via INTRAVENOUS

## 2014-10-05 MED ORDER — ASPIRIN 325 MG PO TABS
325.0000 mg | ORAL_TABLET | Freq: Once | ORAL | Status: AC
  Administered 2014-10-05: 325 mg via ORAL
  Filled 2014-10-05: qty 1

## 2014-10-05 MED ORDER — SODIUM CHLORIDE 0.9 % IV SOLN
8.0000 mg | Freq: Once | INTRAVENOUS | Status: AC
  Administered 2014-10-05: 8 mg via INTRAVENOUS
  Filled 2014-10-05: qty 4

## 2014-10-05 MED ORDER — ACETAMINOPHEN 500 MG PO TABS
1000.0000 mg | ORAL_TABLET | Freq: Once | ORAL | Status: AC
  Administered 2014-10-05: 1000 mg via ORAL

## 2014-10-05 MED ORDER — SODIUM CHLORIDE 0.9 % IV SOLN
INTRAVENOUS | Status: DC
  Administered 2014-10-05 – 2014-10-06 (×2): via INTRAVENOUS

## 2014-10-05 MED ORDER — PIPERACILLIN-TAZOBACTAM 3.375 G IVPB
3.3750 g | Freq: Three times a day (TID) | INTRAVENOUS | Status: DC
  Administered 2014-10-05 – 2014-10-10 (×14): 3.375 g via INTRAVENOUS
  Filled 2014-10-05 (×15): qty 50

## 2014-10-05 MED ORDER — SODIUM CHLORIDE 0.9 % IV BOLUS (SEPSIS)
1000.0000 mL | Freq: Once | INTRAVENOUS | Status: AC
Start: 2014-10-05 — End: 2014-10-05
  Administered 2014-10-05: 1000 mL via INTRAVENOUS

## 2014-10-05 MED ORDER — ENOXAPARIN SODIUM 40 MG/0.4ML ~~LOC~~ SOLN: 40.0000 mg | SUBCUTANEOUS | Status: DC

## 2014-10-05 NOTE — Progress Notes (Signed)
Utilization Review completed.  Ermon Sagan RN CM  

## 2014-10-05 NOTE — ED Notes (Signed)
Sob, acute sudden onset, CA stomach had abd surgery in May, was at [facility] center this am and was told he has cellulitis to incision and placed on ABX, pt has loose bowel for 10 days now,

## 2014-10-05 NOTE — Consult Note (Signed)
Reason for Consult: Abdominal abscess  Referring Physician: Waylyn Tenbrink is an 69 y.o. male.   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 He has had several rounds of FOLFOX chemotherapy under the direction of Dr. [name]. The patient had several admissions for bowel obstruction in recent months and one month ago presented with persistent intractable bowel obstruction and underwent laparotomy with resection of terminal ileum and right colon with anastomosis and also a small bowel bypass more proximally in the ileum. The patient initially did reasonably well, able to get out of the hospital and eat and attend his son's wedding. He had persistent diarrhea, C. Difficile negative on at least 2 occasions felt secondary to his bowel resection. He had some persistent weakness. Over the last couple of days however he has developed progressive erythema of his lower abdominal wall and earlier today had shaking chills and an episode of near syncope and was brought to the emergency department by EMS. The patient complains mainly of marked weakness. He has not had nausea or vomiting. He continues to have diarrhea. He denies abdominal pain.  Past Medical History  Diagnosis Date  . Tremor     takes Primidone 250 once daily and inderall 160 daily for this-has had it since he was 21  . Pneumonia 06/2012  . Macular degeneration   . Benign essential tremor   . Coronary artery disease   . Anemia 06/30/2014  . 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 Essential hypertension 08/18/2014    Past Surgical History  Procedure Laterality Date  . Tonsillectomy    . Video bronchoscopy Bilateral 04/29/2013    Procedure: VIDEO BRONCHOSCOPY WITHOUT FLUORO;  Surgeon: Collene Gobble, MD;  Location: [facility];  Service: Cardiopulmonary;  Laterality: Bilateral;  . Laparotomy N/A 09/09/2014    Procedure: EXPLORATORY LAPAROTOMY resection  terminal ileum and right colon;  Surgeon: Excell Seltzer, MD;  Location: [facility];  Service: General;  Laterality: N/A;  . Bowel resection N/A 09/09/2014    Procedure: SMALL BOWEL bypass;  Surgeon: Excell Seltzer, MD;  Location: [facility];  Service: General;  Laterality: N/A;  . Colostomy revision  09/09/2014    Procedure: COLON RESECTION RIGHT;  Surgeon: Excell Seltzer, MD;  Location: [facility];  Service: General;;    Family History  Problem Relation Age of Onset  . CAD Father 25    Died MI  . Atrial fibrillation Father   . Asthma Father   . Alzheimer's disease Mother   . Diabetes Mother   . Heart disease Mother   . Atrial fibrillation Mother   . Seizures Son     Died status epilepticus    Social History:  reports that he has never smoked. He has never used smokeless tobacco. He reports that he does not drink alcohol or use illicit drugs.  Allergies: No Known Allergies  Current Facility-Administered Medications  Medication Dose Route Frequency Provider Last Rate Last Dose  . 0.9 %  sodium chloride infusion   Intravenous Continuous Hosie Poisson, MD 75 mL/hr at 10/05/14 1845    . enoxaparin (LOVENOX) injection 40 mg  40 mg Subcutaneous Q24H Hosie Poisson, MD      . megestrol (MEGACE) 40 MG/ML suspension 200 mg  200 mg Oral BID Hosie Poisson, MD      . piperacillin-tazobactam (ZOSYN) IVPB 3.375 g  3.375 g Intravenous 3 times per day Hosie Poisson, MD      . Maye Hides  boulardii (FLORASTOR) capsule 250 mg  250 mg Oral BID Hosie Poisson, MD      . Derrill Memo ON 10/06/2014] vancomycin (VANCOCIN) IVPB 1000 mg/200 mL premix  1,000 mg Intravenous Q12H Hosie Poisson, MD        Results for orders placed or performed during the hospital encounter of 10/05/14 (from the past 48 hour(s))  I-stat troponin, ED     Status: Abnormal   Collection Time: 10/05/14  1:47 PM  Result Value Ref Range   Troponin i, poc 0.87 (HH) 0.00 - 0.08 ng/mL   Comment NOTIFIED PHYSICIAN    Comment 3            Comment:  Due to the release kinetics of cTnI, a negative result within the first hours of the onset of symptoms does not rule out myocardial infarction with certainty. If myocardial infarction is still suspected, repeat the test at appropriate intervals.   CBC with Differential     Status: Abnormal   Collection Time: 10/05/14  1:50 PM  Result Value Ref Range   WBC 20.5 (H) 4.0 - 10.5 K/uL   RBC 3.14 (L) 4.22 - 5.81 MIL/uL   Hemoglobin 8.6 (L) 13.0 - 17.0 g/dL   HCT 26.3 (L) 39.0 - 52.0 %   MCV 83.8 78.0 - 100.0 fL   MCH 27.4 26.0 - 34.0 pg   MCHC 32.7 30.0 - 36.0 g/dL   RDW 17.6 (H) 11.5 - 15.5 %   Platelets 434 (H) 150 - 400 K/uL   Neutrophils Relative % 93 (H) 43 - 77 %   Lymphocytes Relative 3 (L) 12 - 46 %   Monocytes Relative 4 3 - 12 %   Eosinophils Relative 0 0 - 5 %   Basophils Relative 0 0 - 1 %   Neutro Abs 19.1 (H) 1.7 - 7.7 K/uL   Lymphs Abs 0.6 (L) 0.7 - 4.0 K/uL   Monocytes Absolute 0.8 0.1 - 1.0 K/uL   Eosinophils Absolute 0.0 0.0 - 0.7 K/uL   Basophils Absolute 0.0 0.0 - 0.1 K/uL   RBC Morphology POLYCHROMASIA PRESENT    WBC Morphology VACUOLATED NEUTROPHILS   I-stat chem 8, ed     Status: Abnormal   Collection Time: 10/05/14  1:50 PM  Result Value Ref Range   Sodium 123 (L) 135 - 145 mmol/L   Potassium 3.6 3.5 - 5.1 mmol/L   Chloride 94 (L) 101 - 111 mmol/L   BUN 7 6 - 20 mg/dL   Creatinine, Ser 0.50 (L) 0.61 - 1.24 mg/dL   Glucose, Bld 95 65 - 99 mg/dL   Calcium, Ion 1.03 (L) 1.13 - 1.30 mmol/L   TCO2 20 0 - 100 mmol/L   Hemoglobin 9.9 (L) 13.0 - 17.0 g/dL   HCT 29.0 (L) 39.0 - 52.0 %  I-Stat CG4 Lactic Acid, ED     Status: Abnormal   Collection Time: 10/05/14  1:52 PM  Result Value Ref Range   Lactic Acid, Venous 2.13 (HH) 0.5 - 2.0 mmol/L   Comment NOTIFIED PHYSICIAN   I-Stat CG4 Lactic Acid, ED     Status: None   Collection Time: 10/05/14  4:23 PM  Result Value Ref Range   Lactic Acid, Venous 1.67 0.5 - 2.0 mmol/L  D-dimer, quantitative (not at [facility])      Status: Abnormal   Collection Time: 10/05/14  4:27 PM  Result Value Ref Range   D-Dimer, Quant 9.76 (H) 0.00 - 0.48 ug/mL-FEU    Comment:  AT THE INHOUSE ESTABLISHED CUTOFF VALUE OF 0.48 ug/mL FEU, THIS ASSAY HAS BEEN DOCUMENTED IN THE LITERATURE TO HAVE A SENSITIVITY AND NEGATIVE PREDICTIVE VALUE OF AT LEAST 98 TO 99%.  THE TEST RESULT SHOULD BE CORRELATED WITH AN ASSESSMENT OF THE CLINICAL PROBABILITY OF DVT / VTE.     Dg Chest 2 View  10/05/2014   CLINICAL DATA:  Fever and shortness of breath beginning today. History of colon carcinoma.  EXAM: CHEST  2 VIEW  COMPARISON:  Single view of the chest 08/18/2014.  FINDINGS: Port-A-Cath is again seen. There are small to moderate bilateral pleural effusions, greater on the right. Basilar airspace disease is also worse on the right. No pneumothorax is identified. Heart size is normal.  IMPRESSION: Right greater than left small to moderate bilateral pleural effusions with associated basilar airspace disease, likely atelectasis.   Electronically Signed   By: Inge Rise M.D.   On: 10/05/2014 14:28   Ct Angio Chest Pe W/cm &/or Wo Cm  10/05/2014   CLINICAL DATA:  Acute onset of generalized abdominal pain and weakness. Hypotension. Diarrhea. Hypoxia. Significantly elevated D-dimer. Current history of adenocarcinoma with carcinomatosis, status post chemotherapy. Initial encounter.  EXAM: CT ANGIOGRAPHY CHEST  CT ABDOMEN AND PELVIS WITH CONTRAST  TECHNIQUE: Multidetector CT imaging of the chest was performed using the standard protocol during bolus administration of intravenous contrast. Multiplanar CT image reconstructions and MIPs were obtained to evaluate the vascular anatomy. Multidetector CT imaging of the abdomen and pelvis was performed using the standard protocol during bolus administration of intravenous contrast.  CONTRAST:  147mL OMNIPAQUE IOHEXOL 350 MG/ML SOLN  COMPARISON:  Abdominal radiographs performed earlier today at 2:12  p.m., and CT of the abdomen and pelvis performed 09/08/2014. CT of the chest performed 03/16/2014  FINDINGS: CTA CHEST FINDINGS  There is no evidence of pulmonary embolus.  Moderate bilateral pleural effusions are seen, with underlying partial consolidation of both lower lung lobes. The expanded portions of both lungs appear relatively clear. There is no evidence of pneumothorax. No masses are identified; no abnormal focal contrast enhancement is seen.  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 The great vessels are grossly unremarkable in appearance. No pericardial effusion is identified. No definite mediastinal lymphadenopathy is seen.  Scattered coronary artery calcifications are noted. There is suggestion of mild diffuse wall thickening along the esophagus, with associated edema, raising concern for some degree of esophagitis. No axillary lymphadenopathy is seen. The thyroid gland is unremarkable in appearance. A right-sided chest port is noted ending about the distal SVC.  No acute osseous abnormalities are seen.  CT ABDOMEN and PELVIS FINDINGS  There appears to be a very large peripherally enhancing evolving abscess at the right side of the abdomen, partially contiguous with additional collections of fluid throughout the remainder of the abdomen and pelvis. This measures approximately 30.9 x 12.2 x 20.0 cm, extending from the level of the liver down into the pelvis. This contains a large amount of fluid and air. Given that the surgery was a month ago, this is unlikely to reflect residual air. There is no evidence of extravasation of contrast into the collection to suggest a perforated viscus, though it cannot be excluded; contrast appears to extend normally through the visualized bowel anastomoses and into the sigmoid colon. A gas-producing organism is thought to be the most likely etiology of the air.  Underlying  moderate to large  volume ascites is noted within the abdomen and pelvis; the underlying ascites has lower attenuation than the fluid within the abscess.  There appears to be prominence of intrahepatic biliary ducts and underlying periportal edema, though the common bile duct is not definitely enlarged. This is of uncertain significance. 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 The spleen is unremarkable in appearance. The superior aspect of the abscess demonstrates some degree of mass effect on the inferior aspect of the liver. A likely small stone is noted along the lateral gallbladder wall. The gallbladder is otherwise grossly unremarkable. The pancreas and adrenal glands are unremarkable.  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 The stomach is grossly unremarkable in appearance, though difficult to fully assess at the level of the hepatic hilum.  A 2.2 cm cyst is noted at the upper pole of the left kidney. The kidneys are otherwise unremarkable. There is no evidence of hydronephrosis. No renal or ureteral stones are seen. No perinephric stranding is appreciated.  There is vague diffuse persistent or recurrent edema involving multiple small bowel loops at the mid and lower abdomen. There is also diffuse nodular mucosal thickening along the remaining ileum extending to the level of the patient's ileocolic anastomosis, and more mild mucosal edema along the distal descending and sigmoid colon. Some of these bowel loops are largely surrounded by the abscess. This may reflect lymphatic congestion, ischemia or infection. Underlying tumor infiltration cannot be excluded, as previously noted. The edematous bowel loops still demonstrate minimal enhancement, without evidence for dead bowel at this time.  No acute vascular abnormalities are seen. Mild scattered calcification is noted along the abdominal aorta and its branches.  The bladder is mildly distended and grossly unremarkable in appearance.  The prostate is enlarged, measuring 5.5 cm in transverse dimension. No inguinal lymphadenopathy is seen.  No acute osseous abnormalities are identified.  Review of the MIP images confirms the above findings.  IMPRESSION: 1. Very large peripherally enhancing evolving abscess at the right side of the abdomen, partially contiguous with additional collections of fluid throughout the remainder of the abdomen and pelvis. This measures 30.9 x 12.2 x 20.0 cm, extending from the level of the liver down into the pelvis, and contains a large amount of fluid and air. No evidence of extravasation of contrast into the collection to suggest a perforated viscus, though it cannot be excluded. Contrast appears to extend normally through the visualized bowel anastomoses. A gas-producing organism is thought to be the most likely etiology of the air. Given the size of the abscess and surrounding ascites, this is likely too large for successful interventional drainage. Surgical consultation is suggested, as deemed clinically appropriate. 2. Underlying moderate to large volume ascites within the abdomen and pelvis. 3. Vague persistent or recurrent diffuse edema involving multiple small bowel loops at the mid and lower abdomen. Diffuse nodular mucosal thickening along the remaining ileum extending to the ileocolic anastomosis, and more mild mucosal edema along the distal descending and sigmoid colon. This may reflect lymphatic congestion, ischemia or infection. As previously noted, underlying tumor infiltration cannot be excluded. The edematous bowel loops still demonstrate minimal enhancement, without definite dead bowel at this time. 4. No evidence of pulmonary embolus. 5. Moderate bilateral pleural effusions, with underlying partial consolidation of both lower lung lobes. 6. Suggestion of mild diffuse wall thickening along the esophagus, with associated edema, raising concern for some degree of esophagitis. 7. Prominence of the  intrahepatic biliary ducts and underlying periportal edema, somewhat more  prominent than on the prior study, though the common bile duct is not definitely enlarged. This is of uncertain significance, though it could reflect some degree of obstruction at the hepatic hilum. Underlying cavernous transformation of the portal vein noted. 8. 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 9. 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 If and when deemed clinically appropriate, would recommend semi-annual imaging followup by CTA or MRA, with elective referral to cardiothoracic surgery if not already obtained. This recommendation follows 2010 ACCF/AHA/AATS/ACR/ASA/SCA/SCAI/SIR/STS/SVM Guidelines for the Diagnosis and Management of Patients With Thoracic Aortic Disease. Circulation. 2010; 121: Q734-L937 10. Scattered coronary artery calcifications seen. 11. Cholelithiasis; gallbladder otherwise unremarkable. 12. Small left renal cyst noted. 13. Mild scattered calcification along the abdominal aorta and its branches. 14. Enlarged prostate noted.  These results were called by telephone at the time of interpretation on 10/05/2014 at 6:50 pm to Dr. Hosie Poisson, who verbally acknowledged these results.   Electronically Signed   By: Garald Balding M.D.   On: 10/05/2014 19:11   Ct Abdomen Pelvis W Contrast  10/05/2014   CLINICAL DATA:  Acute onset of generalized abdominal pain and weakness. Hypotension. Diarrhea. Hypoxia. Significantly elevated D-dimer. Current history of adenocarcinoma with carcinomatosis, status post chemotherapy. Initial encounter.  EXAM: CT ANGIOGRAPHY CHEST  CT ABDOMEN AND PELVIS WITH CONTRAST  TECHNIQUE: Multidetector CT imaging of the chest was performed using the standard protocol during bolus administration of intravenous contrast. Multiplanar CT image reconstructions and MIPs were obtained to evaluate the vascular anatomy. Multidetector CT imaging of the abdomen and  pelvis was performed using the standard protocol during bolus administration of intravenous contrast.  CONTRAST:  189mL OMNIPAQUE IOHEXOL 350 MG/ML SOLN  COMPARISON:  Abdominal radiographs performed earlier today at 2:12 p.m., and CT of the abdomen and pelvis performed 09/08/2014. CT of the chest performed 03/16/2014  FINDINGS: CTA CHEST FINDINGS  There is no evidence of pulmonary embolus.  Moderate bilateral pleural effusions are seen, with underlying partial consolidation of both lower lung lobes. The expanded portions of both lungs appear relatively clear. There is no evidence of pneumothorax. No masses are identified; no abnormal focal contrast enhancement is seen.  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 The great vessels are grossly unremarkable in appearance. No pericardial effusion is identified. No definite mediastinal lymphadenopathy is seen.  Scattered coronary artery calcifications are noted. There is suggestion of mild diffuse wall thickening along the esophagus, with associated edema, raising concern for some degree of esophagitis. No axillary lymphadenopathy is seen. The thyroid gland is unremarkable in appearance. A right-sided chest port is noted ending about the distal SVC.  No acute osseous abnormalities are seen.  CT ABDOMEN and PELVIS FINDINGS  There appears to be a very large peripherally enhancing evolving abscess at the right side of the abdomen, partially contiguous with additional collections of fluid throughout the remainder of the abdomen and pelvis. This measures approximately 30.9 x 12.2 x 20.0 cm, extending from the level of the liver down into the pelvis. This contains a large amount of fluid and air. Given that the surgery was a month ago, this is unlikely to reflect residual air. There is no evidence of extravasation of contrast into the collection to suggest a perforated viscus, though it  cannot be excluded; contrast appears to extend normally through the visualized bowel anastomoses and into the sigmoid colon. A gas-producing  organism is thought to be the most likely etiology of the air.  Underlying moderate to large volume ascites is noted within the abdomen and pelvis; the underlying ascites has lower attenuation than the fluid within the abscess.  There appears to be prominence of intrahepatic biliary ducts and underlying periportal edema, though the common bile duct is not definitely enlarged. This is of uncertain significance. 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 The spleen is unremarkable in appearance. The superior aspect of the abscess demonstrates some degree of mass effect on the inferior aspect of the liver. A likely small stone is noted along the lateral gallbladder wall. The gallbladder is otherwise grossly unremarkable. The pancreas and adrenal glands are unremarkable.  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 The stomach is grossly unremarkable in appearance, though difficult to fully assess at the level of the hepatic hilum.  A 2.2 cm cyst is noted at the upper pole of the left kidney. The kidneys are otherwise unremarkable. There is no evidence of hydronephrosis. No renal or ureteral stones are seen. No perinephric stranding is appreciated.  There is vague diffuse persistent or recurrent edema involving multiple small bowel loops at the mid and lower abdomen. There is also diffuse nodular mucosal thickening along the remaining ileum extending to the level of the patient's ileocolic anastomosis, and more mild mucosal edema along the distal descending and sigmoid colon. Some of these bowel loops are largely surrounded by the abscess. This may reflect lymphatic congestion, ischemia or infection. Underlying tumor infiltration cannot be excluded, as previously noted. The edematous bowel loops still demonstrate minimal enhancement, without evidence for dead  bowel at this time.  No acute vascular abnormalities are seen. Mild scattered calcification is noted along the abdominal aorta and its branches.  The bladder is mildly distended and grossly unremarkable in appearance. The prostate is enlarged, measuring 5.5 cm in transverse dimension. No inguinal lymphadenopathy is seen.  No acute osseous abnormalities are identified.  Review of the MIP images confirms the above findings.  IMPRESSION: 1. Very large peripherally enhancing evolving abscess at the right side of the abdomen, partially contiguous with additional collections of fluid throughout the remainder of the abdomen and pelvis. This measures 30.9 x 12.2 x 20.0 cm, extending from the level of the liver down into the pelvis, and contains a large amount of fluid and air. No evidence of extravasation of contrast into the collection to suggest a perforated viscus, though it cannot be excluded. Contrast appears to extend normally through the visualized bowel anastomoses. A gas-producing organism is thought to be the most likely etiology of the air. Given the size of the abscess and surrounding ascites, this is likely too large for successful interventional drainage. Surgical consultation is suggested, as deemed clinically appropriate. 2. Underlying moderate to large volume ascites within the abdomen and pelvis. 3. Vague persistent or recurrent diffuse edema involving multiple small bowel loops at the mid and lower abdomen. Diffuse nodular mucosal thickening along the remaining ileum extending to the ileocolic anastomosis, and more mild mucosal edema along the distal descending and sigmoid colon. This may reflect lymphatic congestion, ischemia or infection. As previously noted, underlying tumor infiltration cannot be excluded. The edematous bowel loops still demonstrate minimal enhancement, without definite dead bowel at this time. 4. No evidence of pulmonary embolus. 5. Moderate bilateral pleural effusions, with  underlying partial consolidation of both lower lung lobes. 6. Suggestion of mild diffuse wall thickening along the esophagus, with associated edema, raising concern for  some degree of esophagitis. 7. Prominence of the intrahepatic biliary ducts and underlying periportal edema, somewhat more prominent than on the prior study, though the common bile duct is not definitely enlarged. This is of uncertain significance, though it could reflect some degree of obstruction at the hepatic hilum. Underlying cavernous transformation of the portal vein noted. 8. 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 9. 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 If and when deemed clinically appropriate, would recommend semi-annual imaging followup by CTA or MRA, with elective referral to cardiothoracic surgery if not already obtained. This recommendation follows 2010 ACCF/AHA/AATS/ACR/ASA/SCA/SCAI/SIR/STS/SVM Guidelines for the Diagnosis and Management of Patients With Thoracic Aortic Disease. Circulation. 2010; 121: [id number] 10. Scattered coronary artery calcifications seen. 11. Cholelithiasis; gallbladder otherwise unremarkable. 12. Small left renal cyst noted. 13. Mild scattered calcification along the abdominal aorta and its branches. 14. Enlarged prostate noted.  These results were called by telephone at the time of interpretation on 10/05/2014 at 6:50 pm to Dr. Hosie Poisson, who verbally acknowledged these results.   Electronically Signed   By: Garald Balding M.D.   On: 10/05/2014 19:11   Dg Abd 2 Views  10/05/2014   CLINICAL DATA:  Fever. Shortness of breath. Colostomy revision and bowel resection may 2016.  EXAM: ABDOMEN - 2 VIEW  COMPARISON:  09/08/2014  FINDINGS: Airspace opacities probably from pleural effusions of the lung bases.  Nondependent collection of gas in the right abdomen with air-fluid level on the left-side-down lateral decubitus views ; this does not track around the  inferior liver edge, and I do not see regular lines on the frontal projection, accordingly this may be gas within right-sided bowel, but I am not absolutely certain.  IMPRESSION: 1. Bibasilar pleural effusions with associated atelectasis/airspace opacity. 2. Air- fluid level non-dependently in the abdomen on the lateral decubitus view. The gas does not appear to track along the liver edge and accordingly is probably within dilated bowel, but I am not 751% certain that this is intraluminal. CT abdomen recommended. These results will be called to the ordering clinician or representative by the Radiologist Assistant, and communication documented in the PACS or zVision Dashboard.   Electronically Signed   By: Van Clines M.D.   On: 10/05/2014 14:34    Review of Systems  Constitutional: Positive for fever and chills.  Gastrointestinal: Positive for diarrhea. Negative for nausea, vomiting, abdominal pain and blood in stool.  Genitourinary: Negative.   Neurological: Positive for weakness.   Blood pressure 89/60, pulse 77, temperature 98.9 F (37.2 C), temperature source Oral, resp. rate 22, height 6\' 1"  (1.854 m), weight 77.2 kg (170 lb 3.1 oz), SpO2 100 %. Physical Exam General: Thin chronically ill-appearing Caucasian male but alert and conversant and in no acute distress Skin: Pale, see abdomen Lungs: Breath sounds are clear anteriorly without increased work of breathing Lymph nodes: No cervical, supra clavicular or inguinal nodes palpable Cardiac: Regular rate and rhythm. Abdomen: Healed midline incision. Approximately 15 cm area of erythema surrounding the lower incision and lower abdominal wall. No unusual drainage. Abdomen is distended. However, soft and without appreciable tenderness or guarding or peritoneal signs.   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 Status post recent palliative resection of terminal ileum and colon and small bowel  bypass. This was 4 weeks ago. Initially did reasonably well but now presents with an extremely large abdominal abscess. Contrast passes through the bowel into the colon without obstruction or apparent  extravasation. He obviously is very debilitated, malnourished and immunocompromised and I think would be unlikely to ultimately survive a laparotomy and get out of the hospital. I think that an initial approach of percutaneous drainage would be the safest approach and if there is no actual bowel leak, for which we have no  Definitive evidence at present, I think this could be successfully treated with a percutaneous approach. I discussed the case with Dr. Vernard Gambles who is in agreement and he will be proceeding this evening with percutaneous drainage. I discussed the situation in detail with [name] and his wife. They understand the seriousness of the situation and he feels that he would not want to go through a laparotomy. With his overall prognosis I think that this may not be indicated under any circumstance. Agree with broad-spectrum IV antibiotic coverage and fluid resuscitation as currently ordered and would consider support with TNA during this hospitalization.  We will follow.  Kriston Pasquarello T 10/05/2014, 7:56 PM

## 2014-10-05 NOTE — Consult Note (Signed)
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 He was evaluated by Dr Burt Knack then, negative treadmill, f/u AA 4.7 cm. He also has had MAC and bronchiectasis followed by Dr [name]. 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 He has had admissions this year for SBO and ultimately had surgery 09/09/14. He was discharged 09/18/14. He did manage to make it to his son's wedding this past weekend but has been very weak, not eating well. He was at Dr Carin Hock office today. He has developed abdominal wound cellulitis. He was picking up antibiotics when his wife says he became very weak and SOB. His wife brought him to the ED. Here his Troponin is mildly elevated and his EKG has non specific ST changes. He denies any anginal symptoms and his dyspnea is improved.  He denies any hemoptysis. He has had LE edema previously but no history of DVT. He has been sick with diarrhea x 4 days as well as fever and chills at home.    PMHx:  Past Medical History  Diagnosis Date  . Tremor     takes Primidone 250 once daily and inderall 160 daily for this-has had it since he was 21  . Pneumonia 06/2012  . Macular degeneration   . Benign essential tremor   . Coronary artery disease   . Anemia 06/30/2014  . 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 Essential hypertension 08/18/2014    Past Surgical History  Procedure Laterality Date  . Tonsillectomy    . Video bronchoscopy Bilateral 04/29/2013    Procedure: VIDEO BRONCHOSCOPY WITHOUT FLUORO;  Surgeon: Collene Gobble, MD;  Location: [facility];  Service: Cardiopulmonary;  Laterality: Bilateral;  . Laparotomy N/A 09/09/2014    Procedure: EXPLORATORY LAPAROTOMY resection terminal ileum and right colon;  Surgeon: Excell Seltzer, MD;  Location: [facility];  Service: General;  Laterality: N/A;  . Bowel resection N/A 09/09/2014    Procedure: SMALL BOWEL bypass;  Surgeon: Excell Seltzer, MD;  Location: [facility];  Service: General;  Laterality: N/A;  . Colostomy revision  09/09/2014    Procedure: COLON RESECTION RIGHT;  Surgeon: Excell Seltzer, MD;  Location: [facility];  Service: General;;    SOCHx:  reports that he has never smoked. He has never used smokeless tobacco. He reports that he does not drink alcohol or use illicit drugs.  FAMHx: Family History  Problem Relation Age of Onset  . CAD Father 46    Died MI  . Atrial fibrillation Father   . Asthma Father   . Alzheimer's disease Mother   . Diabetes Mother   . Heart disease Mother   . Atrial fibrillation Mother   . Seizures Son     Died status epilepticus    ALLERGIES: No Known Allergies  ROS: Pertinent items are noted in HPI. see H&P for complete ROS  HOME MEDICATIONS: Prior to Admission medications   Medication Sig Start Date End Date Taking? Authorizing Provider  acetaminophen (TYLENOL) 500 MG tablet Take 1,000 mg by mouth every 6 (six) hours as needed for moderate pain or headache.   Yes Historical Provider, MD  doxycycline (VIBRA-TABS) 100 MG tablet Take 1 tablet (100 mg total) by mouth 2 (two) times daily. 10/05/14  Yes Ladell Pier, MD  fluconazole (DIFLUCAN) 100 MG tablet Take 1 tablet (100 mg total) by mouth daily. 10/05/14  Yes Ladell Pier, MD  ibuprofen (ADVIL,MOTRIN) 200 MG tablet Take 800 mg by mouth every 6 (six) hours as needed for headache or moderate pain.   Yes Historical Provider, MD  ipratropium (ATROVENT) 0.06 % nasal spray Place 2 sprays into both nostrils every 6 (six) hours as needed for rhinitis.   Yes Historical Provider, MD  lidocaine-prilocaine (EMLA) cream Apply 1 application topically as needed. Apply to [facility] 1-2 hours prior to stick and cover with plastic wrap 07/08/14  Yes Ladell Pier, MD  loperamide  (IMODIUM) 2 MG capsule Take 2 mg by mouth as needed for diarrhea or loose stools.   Yes Historical Provider, MD  Multiple Vitamins-Minerals (PRESERVISION AREDS PO) Take 2 capsules by mouth every morning.    Yes Historical Provider, MD  ondansetron (ZOFRAN ODT) 4 MG disintegrating tablet Take 1 tablet (4 mg total) by mouth every 4 (four) hours as needed for nausea or vomiting. 08/11/14  Yes Ladell Pier, MD  oxyCODONE (OXY IR/ROXICODONE) 5 MG immediate release tablet Take 1-2 tablets (5-10 mg total) by mouth every 4 (four) hours as needed for severe pain. 07/28/14  Yes Ladell Pier, MD  PRESCRIPTION MEDICATION Chemo [facility]   Yes Historical Provider, MD  primidone (MYSOLINE) 250 MG tablet Take 1 tablet (250 mg total) by mouth at bedtime. Patient taking differently: Take 250 mg by mouth every morning.  10/21/13  Yes Asencion Partridge Dohmeier, MD  prochlorperazine (COMPAZINE) 10 MG tablet Take 1 tablet (10 mg total) by mouth every 6 (six) hours as needed for nausea. 07/08/14  Yes Ladell Pier, MD  propranolol (INDERAL) 10 MG tablet Take 1 tablet (10 mg total) by mouth 2 (two) times daily. 09/23/14  Yes Ladell Pier, MD  ranitidine (ZANTAC) 150 MG tablet Take 150 mg by mouth 2 (two) times daily.   Yes Historical Provider, MD  saccharomyces boulardii (FLORASTOR) 250 MG capsule Take 1 capsule (250 mg total) by mouth 2 (two) times daily. 09/18/14  Yes Donne Hazel, MD  diphenoxylate-atropine (LOMOTIL) 2.5-0.025 MG per tablet Take 1 tablet by mouth 4 (four) times daily as needed for diarrhea or loose stools. Patient not taking: Reported on 10/05/2014 10/05/14   Ladell Pier, MD  HYDROcodone-acetaminophen (NORCO/VICODIN) 5-325 MG per tablet Take 1-2 tablets by mouth every 4 (four) hours as needed for moderate pain. Patient not taking: Reported on 09/23/2014 08/11/14   Ladell Pier, MD  megestrol (MEGACE) 40 MG/ML suspension Take 5 mLs (200 mg total) by mouth 2 (two) times daily. Patient not taking: Reported on  10/05/2014 10/05/14   Ladell Pier, MD    HOSPITAL MEDICATIONS: I have reviewed the patient's current medications.  VITALS: Blood pressure 100/72, pulse 70, temperature 102.2 F (39 C), temperature source Rectal, resp. rate 16, height 6' (1.829 m), SpO2 97 %.  PHYSICAL EXAM: General appearance: alert, cooperative, cachectic, no distress and pale Neck: no carotid bruit and no JVD Lungs: decreased breath sounds, no rales Heart: regular rate and rhythm Abdomen: midline surgical scar, diffuse errythma and warmth Extremities: trace pitting edema Lt calf Pulses: 2+ and symmetric Skin: pale, cool, dry Neurologic: Grossly normal  LABS: Results for orders placed or performed during the hospital encounter of 10/05/14 (from the past 24 hour(s))  I-stat troponin, ED     Status: Abnormal   Collection Time: 10/05/14  1:47 PM  Result Value Ref Range  Troponin i, poc 0.87 (HH) 0.00 - 0.08 ng/mL   Comment NOTIFIED PHYSICIAN    Comment 3          CBC with Differential     Status: Abnormal   Collection Time: 10/05/14  1:50 PM  Result Value Ref Range   WBC 20.5 (H) 4.0 - 10.5 K/uL   RBC 3.14 (L) 4.22 - 5.81 MIL/uL   Hemoglobin 8.6 (L) 13.0 - 17.0 g/dL   HCT 26.3 (L) 39.0 - 52.0 %   MCV 83.8 78.0 - 100.0 fL   MCH 27.4 26.0 - 34.0 pg   MCHC 32.7 30.0 - 36.0 g/dL   RDW 17.6 (H) 11.5 - 15.5 %   Platelets 434 (H) 150 - 400 K/uL   Neutrophils Relative % 93 (H) 43 - 77 %   Lymphocytes Relative 3 (L) 12 - 46 %   Monocytes Relative 4 3 - 12 %   Eosinophils Relative 0 0 - 5 %   Basophils Relative 0 0 - 1 %   Neutro Abs 19.1 (H) 1.7 - 7.7 K/uL   Lymphs Abs 0.6 (L) 0.7 - 4.0 K/uL   Monocytes Absolute 0.8 0.1 - 1.0 K/uL   Eosinophils Absolute 0.0 0.0 - 0.7 K/uL   Basophils Absolute 0.0 0.0 - 0.1 K/uL   RBC Morphology POLYCHROMASIA PRESENT    WBC Morphology VACUOLATED NEUTROPHILS   I-stat chem 8, ed     Status: Abnormal   Collection Time: 10/05/14  1:50 PM  Result Value Ref Range   Sodium 123  (L) 135 - 145 mmol/L   Potassium 3.6 3.5 - 5.1 mmol/L   Chloride 94 (L) 101 - 111 mmol/L   BUN 7 6 - 20 mg/dL   Creatinine, Ser 0.50 (L) 0.61 - 1.24 mg/dL   Glucose, Bld 95 65 - 99 mg/dL   Calcium, Ion 1.03 (L) 1.13 - 1.30 mmol/L   TCO2 20 0 - 100 mmol/L   Hemoglobin 9.9 (L) 13.0 - 17.0 g/dL   HCT 29.0 (L) 39.0 - 52.0 %  I-Stat CG4 Lactic Acid, ED     Status: Abnormal   Collection Time: 10/05/14  1:52 PM  Result Value Ref Range   Lactic Acid, Venous 2.13 (HH) 0.5 - 2.0 mmol/L   Comment NOTIFIED PHYSICIAN     EKG: NSR, NSST changes  IMAGING: Dg Chest 2 View  10/05/2014   CLINICAL DATA:  Fever and shortness of breath beginning today. History of colon carcinoma.  EXAM: CHEST  2 VIEW  COMPARISON:  Single view of the chest 08/18/2014.  FINDINGS: Port-A-Cath is again seen. There are small to moderate bilateral pleural effusions, greater on the right. Basilar airspace disease is also worse on the right. No pneumothorax is identified. Heart size is normal.  IMPRESSION: Right greater than left small to moderate bilateral pleural effusions with associated basilar airspace disease, likely atelectasis.   Electronically Signed   By: Inge Rise M.D.   On: 10/05/2014 14:28   Dg Abd 2 Views  10/05/2014   CLINICAL DATA:  Fever. Shortness of breath. Colostomy revision and bowel resection may 2016.  EXAM: ABDOMEN - 2 VIEW  COMPARISON:  09/08/2014  FINDINGS: Airspace opacities probably from pleural effusions of the lung bases.  Nondependent collection of gas in the right abdomen with air-fluid level on the left-side-down lateral decubitus views ; this does not track around the inferior liver edge, and I do not see regular lines on the frontal projection, accordingly this may be gas within  right-sided bowel, but I am not absolutely certain.  IMPRESSION: 1. Bibasilar pleural effusions with associated atelectasis/airspace opacity. 2. Air- fluid level non-dependently in the abdomen on the lateral decubitus  view. The gas does not appear to track along the liver edge and accordingly is probably within dilated bowel, but I am not 765% certain that this is intraluminal. CT abdomen recommended. These results will be called to the ordering clinician or representative by the Radiologist Assistant, and communication documented in the PACS or zVision Dashboard.   Electronically Signed   By: Van Clines M.D.   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 Will review with Dr Haroldine Laws but I'm not sure we much more to offer.   Time Spent Directly with Patient: 50 minutes  Erlene Quan [phone] beeper 10/05/2014, 4:18 PM   Patient seen and examined with Kerin Ransom, PA-C. We discussed all aspects of the encounter. I agree with the assessment and plan as stated above.   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 Now admitted with abdominal cellulitis and acute episode of weakness and dizziness which now has resolved. ECG with minimal non-specific ST depression. Troponin mildly elevated at 0.87. CXR with bilateral effusions and airspace disease. D-dimer markedly elevated which is non-specific in the setting of his other comorbidities.   Suspect major issue is infectious/sepsis. However given ECG and troponin findings suspect he also may have component of demand ischemia but he has not had CP and is not candidate for further w/u for CAD. With airspace disease on CXR and markedly elevated d-dimer I think it would be  reasonable to proceed with CT chest. He has been losing extensive amounts of fluid from diarrhea and neck veins not up so would not favor trying to diurese effusions off at this point unless he gets worse.   Now that he has made it to his son's wedding this past weekend, likely nearing the time to involve Palliative Care. We will follow.   Kaysey Berndt,MD 6:22 PM

## 2014-10-05 NOTE — Progress Notes (Signed)
  [place] OFFICE PROGRESS NOTE   Diagnosis: Metastatic carcinoma with abdominal carcinomatosis  INTERVAL HISTORY:   Wesley Dillon returns after missing a scheduled visit last week. He was able to attend the wedding of his son last weekend. He complains of anorexia, persistent loose stools, weakness, and he has developed erythema at the abdominal wall. No pain, fever, or nausea. He is able to ambulate and is participating in physical therapy.  Objective:  Vital signs in last 24 hours:  Blood pressure 99/63, pulse 86, temperature 98 F (36.7 C), temperature source Oral, resp. rate 18, height 6\' 1"  (1.854 m), weight 173 lb 6.4 oz (78.654 kg), SpO2 98 %.    HEENT: Mild thrush at the tongue and buccal mucosa Resp: Lungs clear bilaterally, distant breath sounds Cardio: Regular rate and rhythm with premature beats GI: The abdomen is distended, nontender, no mass. Erythema at the low abdominal wall Vascular: Pitting edema at the left greater than right lower leg     Portacath/PICC-without erythema  Lab Results:  Lab Results  Component Value Date   WBC 20.3* 10/05/2014   HGB 8.8* 10/05/2014   HCT 27.2* 10/05/2014   MCV 82.4 10/05/2014   PLT 378 10/05/2014   NEUTROABS 18.2* 10/05/2014   Potassium 3.4, creatinine 0.6, sodium 127, BUN 7.3, creatinine 0.6, albumen 1.3  Medications: I have reviewed the patient's current medications.  Assessment/Plan: 1. 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  CT 06/30/2014 consistent with a partial small bowel obstruction and abdominal carcinomatosis  Omentum biopsy 07/01/2014 confirmed adenocarcinoma, nonspecific staining pattern  PET scan 07/07/2014 with multiple areas of hypermetabolic bowel wall thickening consistent with serosal implants from carcinomatosis, thickening and hypermetabolic activity at the terminal ileum felt to potentially represent a primary neoplasm,  solitary right liver metastasis, hypermetabolic nodular lung lesions  Cycle 1 FOLFOX 07/15/2014  Cycle 2 FOLFOX 07/28/2014  Cycle 3 FOLFOX 08/11/2014  Cycle 4 FOLFOX 08/25/2014  2. Abdominal pain secondary to #1, improved 3. Iron deficiency, Hemoccult positive stool 4. Partial small bowel obstruction-recurrent bowel obstruction symptoms requiring hospital admission 08/18/2014 5. Bronchiectasis 6. 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 Nausea following cycle 1 FOLFOX, Emend and Aloxi added with cycle 2 8. Neutropenia secondary to chemotherapy-Neulasta will be added with cycle 4 FOLFOX 9. Admission 09/06/2014 with a small bowel obstruction, status post resection of the terminal ileum/right colon and small bowel bypass 09/09/2014  Pathology confirmed diffuse involvement of the small bowel, large bowel, and appendix with adenocarcinoma-no primary tumor site found 10. Anemia secondary to chronic disease, chemotherapy, and recent surgery  2 units packed red blood cells 09/23/2014  11.  Malnutrition 12.  Erythema at the low anterior abdomen-he will begin a course of doxycycline 13.  Frequent loose bowel movements following in the right colectomy and small bowel bypass  Disposition:  Wesley Dillon continues to have a poor performance status. I suspect the diarrhea is related to the right colectomy. He will begin a trial of Lomotil and will contact us if this does not help. He continues to have anorexia and hypoalbuminemia. He will begin a trial of Megace.  I prescribed doxycycline for a possible abdominal wall cellulitis. He will contact us for a fever or a progressive area of erythema.  His performance status is not adequate to receive further chemotherapy at present. He will return for an office visit and reassessment in one week.  Betsy Coder, MD  10/05/2014  10:20 AM

## 2014-10-05 NOTE — Progress Notes (Addendum)
2130 pt transported to CT for CT guided drain placement 2132 pt assisted to CT table with no difficulty 2135 BP soft, an additional 528ml NS bolus started. 2136 MD in room at pt's side 2140 Timeout performed, 1mg  Versed IV given per MD order 2145 Drain placed by MD, pt tolerating procedure well 2150 Procedure done, total emptied from drain in CT was 1700. 2215 Pt transported back to rm 1239, BP 107/62.  Francia Greaves [place],RN,BSN,CCRN

## 2014-10-05 NOTE — ED Notes (Signed)
Bed: MD47 Expected date: 10/05/14 Expected time: 12:08 PM Means of arrival: Ambulance Comments: EMS Cancer pt SOB

## 2014-10-05 NOTE — Progress Notes (Signed)
EDCM spoke to patient and his wife Wesley Dillon at bedside.  Patient lives with his wife at home.  Patient's wife is with him 24/7.  Patient confirms he has home health services with [facility] for PT.  Patient has a walker, wheelchair and shower chair at home.  Patient confirms his pcp is Dr. Leighton Ruff.  Patient noted to be wearing oxygen in the ED, patient does not wear oxygen at home.  Patient reports he has seen Dr. [name], oncology since he was discharged.  Patient reports he has received a discharge follow up phone call.  Patient noted to be admitted and discharged from [facility] from 5/15 to 5/27.  No further EDCM needs at this time.

## 2014-10-05 NOTE — Progress Notes (Signed)
[facility] text [facility] care representative to make aware of patient's admission.

## 2014-10-05 NOTE — Plan of Care (Signed)
Problem: Consults Goal: General Medical Patient Education See Patient Education Module for specific education.  Outcome: Progressing Large abscess to left abd wall

## 2014-10-05 NOTE — H&P (Signed)
[facility] History and Physical  Wesley Dillon ZOX:[id number] DOB: Nov 23, 1945 DOA: 10/05/2014  Referring physician: edp PCP: Wesley Heck, MD   Chief Complaint: came in for fever, sob and generalized weakness.   HPI: Wesley Dillon is a 69 y.o. male with h/o adenocarcinoma carcinomatosis, CAD, anemia, hypertension, recently discharged from the hospital presents today with the above symptoms. On arrival to ED, he ws found to be febrile, mildly hypotensive, and underwent CXR and abd films. His CXR revealed bilateral air space disease, questionable pneumonia, and his abdominal film revealed gaseous distention. It was followed with a CT abdomen, revealing a 30 cm abdominal abscess. He reports some chest discomfort and sob, some abdominal discomfort, from distention. He also reports profuse diarrhea since the last surgery with minimal po intake. He reports absolute lack of apettite. Labs reveal, elevated lactic acid, elevated d dimer and hyponatremia of 123 and leukocytosis of 20,000. Surgery was consulted for evaluation and management of abscess.    Review of Systems:  Please see HPI for detailed 12 POINT  ROS   Past Medical History  Diagnosis Date  . Tremor     takes Primidone 250 once daily and inderall 160 daily for this-has had it since he was 21  . Pneumonia 06/2012  . Macular degeneration   . Benign essential tremor   . Coronary artery disease   . Anemia 06/30/2014  . 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 Essential hypertension 08/18/2014   Past Surgical History  Procedure Laterality Date  . Tonsillectomy    . Video bronchoscopy Bilateral 04/29/2013    Procedure: VIDEO BRONCHOSCOPY WITHOUT FLUORO;  Surgeon: Collene Gobble, MD;  Location: [facility];  Service: Cardiopulmonary;  Laterality: Bilateral;  . Laparotomy N/A 09/09/2014    Procedure: EXPLORATORY LAPAROTOMY resection terminal ileum and right colon;  Surgeon: Excell Seltzer, MD;  Location: [facility];   Service: General;  Laterality: N/A;  . Bowel resection N/A 09/09/2014    Procedure: SMALL BOWEL bypass;  Surgeon: Excell Seltzer, MD;  Location: [facility];  Service: General;  Laterality: N/A;  . Colostomy revision  09/09/2014    Procedure: COLON RESECTION RIGHT;  Surgeon: Excell Seltzer, MD;  Location: [facility];  Service: General;;   Social History:  reports that he has never smoked. He has never used smokeless tobacco. He reports that he does not drink alcohol or use illicit drugs.  No Known Allergies  Family History  Problem Relation Age of Onset  . CAD Father 27    Died MI  . Atrial fibrillation Father   . Asthma Father   . Alzheimer's disease Mother   . Diabetes Mother   . Heart disease Mother   . Atrial fibrillation Mother   . Seizures Son     Died status epilepticus    do not leave blank  Prior to Admission medications   Medication Sig Start Date End Date Taking? Authorizing Provider  acetaminophen (TYLENOL) 500 MG tablet Take 1,000 mg by mouth every 6 (six) hours as needed for moderate pain or headache.   Yes Historical Provider, MD  doxycycline (VIBRA-TABS) 100 MG tablet Take 1 tablet (100 mg total) by mouth 2 (two) times daily. 10/05/14  Yes Ladell Pier, MD  fluconazole (DIFLUCAN) 100 MG tablet Take 1 tablet (100 mg total) by mouth daily. 10/05/14  Yes Ladell Pier, MD  ibuprofen (ADVIL,MOTRIN) 200 MG tablet Take 800 mg by mouth every 6 (six) hours as needed for headache or moderate pain.   Yes Historical  Provider, MD  ipratropium (ATROVENT) 0.06 % nasal spray Place 2 sprays into both nostrils every 6 (six) hours as needed for rhinitis.   Yes Historical Provider, MD  lidocaine-prilocaine (EMLA) cream Apply 1 application topically as needed. Apply to [facility] 1-2 hours prior to stick and cover with plastic wrap 07/08/14  Yes Ladell Pier, MD  loperamide (IMODIUM) 2 MG capsule Take 2 mg by mouth as needed for diarrhea or loose stools.   Yes Historical Provider, MD  Multiple  Vitamins-Minerals (PRESERVISION AREDS PO) Take 2 capsules by mouth every morning.    Yes Historical Provider, MD  ondansetron (ZOFRAN ODT) 4 MG disintegrating tablet Take 1 tablet (4 mg total) by mouth every 4 (four) hours as needed for nausea or vomiting. 08/11/14  Yes Ladell Pier, MD  oxyCODONE (OXY IR/ROXICODONE) 5 MG immediate release tablet Take 1-2 tablets (5-10 mg total) by mouth every 4 (four) hours as needed for severe pain. 07/28/14  Yes Ladell Pier, MD  PRESCRIPTION MEDICATION Chemo [facility]   Yes Historical Provider, MD  primidone (MYSOLINE) 250 MG tablet Take 1 tablet (250 mg total) by mouth at bedtime. Patient taking differently: Take 250 mg by mouth every morning.  10/21/13  Yes Asencion Partridge Dohmeier, MD  prochlorperazine (COMPAZINE) 10 MG tablet Take 1 tablet (10 mg total) by mouth every 6 (six) hours as needed for nausea. 07/08/14  Yes Ladell Pier, MD  propranolol (INDERAL) 10 MG tablet Take 1 tablet (10 mg total) by mouth 2 (two) times daily. 09/23/14  Yes Ladell Pier, MD  ranitidine (ZANTAC) 150 MG tablet Take 150 mg by mouth 2 (two) times daily.   Yes Historical Provider, MD  saccharomyces boulardii (FLORASTOR) 250 MG capsule Take 1 capsule (250 mg total) by mouth 2 (two) times daily. 09/18/14  Yes Donne Hazel, MD  diphenoxylate-atropine (LOMOTIL) 2.5-0.025 MG per tablet Take 1 tablet by mouth 4 (four) times daily as needed for diarrhea or loose stools. Patient not taking: Reported on 10/05/2014 10/05/14   Ladell Pier, MD  HYDROcodone-acetaminophen (NORCO/VICODIN) 5-325 MG per tablet Take 1-2 tablets by mouth every 4 (four) hours as needed for moderate pain. Patient not taking: Reported on 09/23/2014 08/11/14   Ladell Pier, MD  megestrol (MEGACE) 40 MG/ML suspension Take 5 mLs (200 mg total) by mouth 2 (two) times daily. Patient not taking: Reported on 10/05/2014 10/05/14   Ladell Pier, MD   Physical Exam: Filed Vitals:   10/05/14 1325 10/05/14 1530 10/05/14 1650 10/05/14  1702  BP:   82/54   Pulse:   104   Temp: 102.2 F (39 C)   99 F (37.2 C)  TempSrc: Rectal   Rectal  Resp:   17   Height:      SpO2:  95% 99%     Wt Readings from Last 3 Encounters:  10/05/14 78.654 kg (173 lb 6.4 oz)  09/23/14 77.384 kg (170 lb 9.6 oz)  09/06/14 77.52 kg (170 lb 14.4 oz)    General:  Appears to be in mild distress on 3 lit [place] oxygen. Cachetic looking gentleman.  Eyes: PERRL, normal lids, irises & conjunctiva Neck: no LAD, masses or thyromegaly CVS: s1s2, no pedal edema.  Respiratory: diminished at bases, no wheezing or rhonchi.  Abdomen: firm, distended ,, bowel sounds not heard. Non tender, abdominal wall erythema Musculoskeletal: grossly normal tone BUE/BLE Neurologic: alert, oriented speech normal. Able to move all extremities.  Labs on Admission:  Basic Metabolic Panel:  Recent Labs Lab 10/05/14 0837 10/05/14 1350  NA 127* 123*  K 3.4* 3.6  CL  --  94*  CO2 20*  --   GLUCOSE 97 95  BUN 7.3 7  CREATININE 0.6* 0.50*  CALCIUM 7.6*  --    Liver Function Tests:  Recent Labs Lab 10/05/14 0837  AST 20  ALT 17  ALKPHOS 165*  BILITOT 0.72  PROT 6.0*  ALBUMIN 1.3*   No results for input(s): LIPASE, AMYLASE in the last 168 hours. No results for input(s): AMMONIA in the last 168 hours. CBC:  Recent Labs Lab 10/05/14 0837 10/05/14 1350  WBC 20.3* 20.5*  NEUTROABS 18.2* 19.1*  HGB 8.8* 8.6*  9.9*  HCT 27.2* 26.3*  29.0*  MCV 82.4 83.8  PLT 378 434*   Cardiac Enzymes: No results for input(s): CKTOTAL, CKMB, CKMBINDEX, TROPONINI in the last 168 hours.  BNP (last 3 results) No results for input(s): BNP in the last 8760 hours.  ProBNP (last 3 results) No results for input(s): PROBNP in the last 8760 hours.  CBG: No results for input(s): GLUCAP in the last 168 hours.  Radiological Exams on Admission: Dg Chest 2 View  10/05/2014   CLINICAL DATA:  Fever and shortness of breath beginning today. History of colon  carcinoma.  EXAM: CHEST  2 VIEW  COMPARISON:  Single view of the chest 08/18/2014.  FINDINGS: Port-A-Cath is again seen. There are small to moderate bilateral pleural effusions, greater on the right. Basilar airspace disease is also worse on the right. No pneumothorax is identified. Heart size is normal.  IMPRESSION: Right greater than left small to moderate bilateral pleural effusions with associated basilar airspace disease, likely atelectasis.   Electronically Signed   By: Inge Rise M.D.   On: 10/05/2014 14:28   Dg Abd 2 Views  10/05/2014   CLINICAL DATA:  Fever. Shortness of breath. Colostomy revision and bowel resection may 2016.  EXAM: ABDOMEN - 2 VIEW  COMPARISON:  09/08/2014  FINDINGS: Airspace opacities probably from pleural effusions of the lung bases.  Nondependent collection of gas in the right abdomen with air-fluid level on the left-side-down lateral decubitus views ; this does not track around the inferior liver edge, and I do not see regular lines on the frontal projection, accordingly this may be gas within right-sided bowel, but I am not absolutely certain.  IMPRESSION: 1. Bibasilar pleural effusions with associated atelectasis/airspace opacity. 2. Air- fluid level non-dependently in the abdomen on the lateral decubitus view. The gas does not appear to track along the liver edge and accordingly is probably within dilated bowel, but I am not 694% certain that this is intraluminal. CT abdomen recommended. These results will be called to the ordering clinician or representative by the Radiologist Assistant, and communication documented in the PACS or zVision Dashboard.   Electronically Signed   By: Van Clines M.D.   On: 10/05/2014 14:34    EKG: Independently reviewed.   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 :  Admitted to step down.  Sepsis protocol in place. IV resuscitation t keep MAP greater than 65. IV antibiotics with IV vancomycin and IV zosyn.  Blood cultures drawn and pending.  On 3 lit [place] oxygen to keep sats greater than 90% Surgery consulted for management of the abdominal abscess.  Elevated lactic acid on admission.    Abdominal carcinomatosis: Recent surgery. In may 2016.  Further management as per Dr Learta Codding.   Elevated troponins: Probably from demand ischemia from sepsis.  Cardiology consulted and recommendations given.    Hyponatremia: Probably from anasarca and dehydration.    Leukocytosis: Probably from sepsis.    Anemia: Normocytic, anemia of chronic disease.    Diarrhea: Unclear etiology.  Recent use of antibiotics.  Get c diff pcr.    Code Status: full code. DVT Prophylaxis: Family Communication: wife at bedside. Discussed. Disposition Plan: admitted to stepdown.   Poor Prognosis.   Time spent: 80 minutes.   [facility] Pager [phone]

## 2014-10-05 NOTE — Progress Notes (Addendum)
Chaplain met wife of pt as she sat just outside the ICU entrance awaiting the pt to be settled in to his room. The conversation dealt with the wife's disillusionment with God after the unexpected death of her son and now the decline in health of her husband. A second son recently was married and the pt was too weak to remain long at the celebration. The wife. [name], is so overwhelmed with grief and anger at cancer, God and all other facts that seem aligned to take her husband away from her, She is not presently ready to let go of the thought that there is a chance he will get better.   The chaplain was paged to the room after pt wife was given medically bad news about her husband's condition, She did not wish to talk of this situation because she felt her emotions were too raw at the moment. Chaplain was allowed to pray with the patient and his wife.   Chaplains are available at all times to assist in the care of this pt.  Sallee Lange. Dyanna Seiter, [place]

## 2014-10-05 NOTE — Telephone Encounter (Signed)
STOPPED AT THE PHARMACY TO PICK UP PT.'S PRESCRIPTION. AT [place]. HE BEGAN HAVING CHILLS AND SHORTNESS OF BREATH. WIFE WANTED TO CALL 911 BUT PT. TOLD HER TO CALL DR.SHERRILL'S OFFICE. HEARD WIFE ASK PT. IF "HE WAS STILL WITH HER". INSTRUCTED PT.'S WIFE TO CALL 911 AND HAVE PT. TAKEN TO THE EMERGENCY DEPARTMENT. SHE VOICES UNDERSTANDING.

## 2014-10-05 NOTE — Progress Notes (Signed)
ANTIBIOTIC CONSULT NOTE - INITIAL  Pharmacy Consult for Vancomycin / Zosyn Indication: HCAP  No Known Allergies  Patient Measurements: Height: 6' (182.9 cm) IBW/kg (Calculated) : 77.6 Adjusted Body Weight:   Vital Signs: Temp: 102.2 F (39 C) (06/13 1325) Temp Source: Rectal (06/13 1325) BP: 100/72 mmHg (06/13 1244) Pulse Rate: 70 (06/13 1244) Intake/Output from previous day:   Intake/Output from this shift:    Labs:  Recent Labs  10/05/14 0837 10/05/14 0837 10/05/14 1350  WBC 20.3*  --  20.5*  HGB 8.8*  --  8.6*  9.9*  PLT 378  --  434*  CREATININE  --  0.6* 0.50*   Estimated Creatinine Clearance: 98.3 mL/min (by C-G formula based on Cr of 0.5). No results for input(s): VANCOTROUGH, VANCOPEAK, VANCORANDOM, GENTTROUGH, GENTPEAK, GENTRANDOM, TOBRATROUGH, TOBRAPEAK, TOBRARND, AMIKACINPEAK, AMIKACINTROU, AMIKACIN in the last 72 hours.   Microbiology: Recent Results (from the past 720 hour(s))  Surgical pcr screen     Status: None   Collection Time: 09/08/14 10:02 PM  Result Value Ref Range Status   MRSA, PCR NEGATIVE NEGATIVE Final   Staphylococcus aureus NEGATIVE NEGATIVE Final    Comment:        The Xpert SA Assay (FDA approved for NASAL specimens in patients over 68 years of age), is one component of a comprehensive surveillance program.  Test performance has been validated by [facility] for patients greater than or equal to 68 year old. It is not intended to diagnose infection nor to guide or monitor treatment.   Clostridium Difficile by PCR     Status: None   Collection Time: 09/15/14  3:03 AM  Result Value Ref Range Status   C difficile by pcr NEGATIVE NEGATIVE Final  Stool culture     Status: None   Collection Time: 09/17/14 10:18 AM  Result Value Ref Range Status   Specimen Description STOOL  Final   Special Requests NONE  Final   Culture   Final    NO SALMONELLA, SHIGELLA, CAMPYLOBACTER, YERSINIA, OR E.COLI [id number]:[id number] ISOLATED Performed at  [employer]    Report Status 09/21/2014 FINAL  Final  Clostridium Difficile by PCR     Status: None   Collection Time: 09/17/14 10:18 AM  Result Value Ref Range Status   C difficile by pcr NEGATIVE NEGATIVE Final    Medical History: Past Medical History  Diagnosis Date  . Tremor     takes Primidone 250 once daily and inderall 160 daily for this-has had it since he was 21  . Pneumonia 06/2012  . Macular degeneration   . Benign essential tremor   . Coronary artery disease   . Anemia 06/30/2014  . 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 Essential hypertension 08/18/2014   Assessment: 69 yoM with history of metastatic carcinoma with abdominal carcinomatosis and recent hospitalization for SBO s/p resection, colectomy, and small bowel bypass.  Pt seen earlier today at [facility] and started on doxycycline for for possible abdominal wall cellulitis.  Now presents to ED with SOB and fever.  Pharmacy consulted to start broad spectrum antibiotics for HCAP. First doses given in ED.   WBC elevated.  SCr 0.50, CrCl 98CG/90N.  Lactic acid 2.13. Tm24h: 102.2  D1 antibioitics 6/13 >> Doxy x 1 >> 6/13 6/13 >> Vanc  >> 6/13 >> Zosyn  >>    Cultures: 6/13 Blood x2: IP 6/13 CDiff: ordered  Goal of Therapy:  Vancomycin trough level 15-20 mcg/ml  Eradication of infection  Plan:  Zosyn 3.375g  IV q8h (infuse over 4 hours) Vancomycin 1g IV q12h F/u renal fxn, cultures, clinical course  Ralene Bathe, PharmD, BCPS 10/05/2014, 4:23 PM  Pager: [phone]

## 2014-10-05 NOTE — ED Provider Notes (Addendum)
CSN: [id number]     Arrival date & time 10/05/14  1227 History   First MD Initiated Contact with Patient 10/05/14 1304     Chief Complaint  Patient presents with  . Shortness of Breath     (Consider location/radiation/quality/duration/timing/severity/associated sxs/prior Treatment) The history is provided by the patient.  Wesley Dillon is a 69 y.o. male hx of recurrent pneumonia, ometal carcinomatosis, SBO here with fever. He has some redness on his abdomen since May. Went to see his oncologist today and was prescribed doxycycline. He took one dose of doxycycline and while he was at home, he had an episode of chills, diaphoresis, some cough. Has chronic abdominal pain that is not getting worse. Denies any vomiting. Has some shortness of breath and denies any history of blood clots. Has leg swelling that has not changed. Of note, patient has been admitted multiple times and most recently about 2 weeks ago for a small bowel obstruction.    Past Medical History  Diagnosis Date  . Tremor     takes Primidone 250 once daily and inderall 160 daily for this-has had it since he was 21  . Pneumonia 06/2012  . Macular degeneration   . Benign essential tremor   . Coronary artery disease   . Anemia 06/30/2014  . 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 Essential hypertension 08/18/2014   Past Surgical History  Procedure Laterality Date  . Tonsillectomy    . Video bronchoscopy Bilateral 04/29/2013    Procedure: VIDEO BRONCHOSCOPY WITHOUT FLUORO;  Surgeon: Collene Gobble, MD;  Location: [facility];  Service: Cardiopulmonary;  Laterality: Bilateral;  . Laparotomy N/A 09/09/2014    Procedure: EXPLORATORY LAPAROTOMY resection terminal ileum and right colon;  Surgeon: Excell Seltzer, MD;  Location: [facility];  Service: General;  Laterality: N/A;  . Bowel resection N/A 09/09/2014    Procedure: SMALL BOWEL bypass;  Surgeon: Excell Seltzer, MD;  Location: [facility];  Service: General;  Laterality: N/A;  .  Colostomy revision  09/09/2014    Procedure: COLON RESECTION RIGHT;  Surgeon: Excell Seltzer, MD;  Location: [facility];  Service: General;;   Family History  Problem Relation Age of Onset  . CAD Father 57    Died MI  . Atrial fibrillation Father   . Asthma Father   . Alzheimer's disease Mother   . Diabetes Mother   . Heart disease Mother   . Atrial fibrillation Mother   . Seizures Son     Died status epilepticus   History  Substance Use Topics  . Smoking status: Never Smoker   . Smokeless tobacco: Never Used  . Alcohol Use: No     Comment: wine with dinner    Review of Systems  Constitutional: Positive for fever and chills.  Respiratory: Positive for shortness of breath.   All other systems reviewed and are negative.     Allergies  Review of patient's allergies indicates no known allergies.  Home Medications   Prior to Admission medications   Medication Sig Start Date End Date Taking? Authorizing Provider  acetaminophen (TYLENOL) 500 MG tablet Take 1,000 mg by mouth every 6 (six) hours as needed for moderate pain or headache.   Yes Historical Provider, MD  doxycycline (VIBRA-TABS) 100 MG tablet Take 1 tablet (100 mg total) by mouth 2 (two) times daily. 10/05/14  Yes Ladell Pier, MD  fluconazole (DIFLUCAN) 100 MG tablet Take 1 tablet (100 mg total) by mouth daily. 10/05/14  Yes Ladell Pier, MD  ibuprofen (  ADVIL,MOTRIN) 200 MG tablet Take 800 mg by mouth every 6 (six) hours as needed for headache or moderate pain.   Yes Historical Provider, MD  ipratropium (ATROVENT) 0.06 % nasal spray Place 2 sprays into both nostrils every 6 (six) hours as needed for rhinitis.   Yes Historical Provider, MD  lidocaine-prilocaine (EMLA) cream Apply 1 application topically as needed. Apply to [facility] 1-2 hours prior to stick and cover with plastic wrap 07/08/14  Yes Ladell Pier, MD  loperamide (IMODIUM) 2 MG capsule Take 2 mg by mouth as needed for diarrhea or loose stools.   Yes  Historical Provider, MD  Multiple Vitamins-Minerals (PRESERVISION AREDS PO) Take 2 capsules by mouth every morning.    Yes Historical Provider, MD  ondansetron (ZOFRAN ODT) 4 MG disintegrating tablet Take 1 tablet (4 mg total) by mouth every 4 (four) hours as needed for nausea or vomiting. 08/11/14  Yes Ladell Pier, MD  oxyCODONE (OXY IR/ROXICODONE) 5 MG immediate release tablet Take 1-2 tablets (5-10 mg total) by mouth every 4 (four) hours as needed for severe pain. 07/28/14  Yes Ladell Pier, MD  PRESCRIPTION MEDICATION Chemo [facility]   Yes Historical Provider, MD  primidone (MYSOLINE) 250 MG tablet Take 1 tablet (250 mg total) by mouth at bedtime. Patient taking differently: Take 250 mg by mouth every morning.  10/21/13  Yes Asencion Partridge Dohmeier, MD  prochlorperazine (COMPAZINE) 10 MG tablet Take 1 tablet (10 mg total) by mouth every 6 (six) hours as needed for nausea. 07/08/14  Yes Ladell Pier, MD  propranolol (INDERAL) 10 MG tablet Take 1 tablet (10 mg total) by mouth 2 (two) times daily. 09/23/14  Yes Ladell Pier, MD  ranitidine (ZANTAC) 150 MG tablet Take 150 mg by mouth 2 (two) times daily.   Yes Historical Provider, MD  saccharomyces boulardii (FLORASTOR) 250 MG capsule Take 1 capsule (250 mg total) by mouth 2 (two) times daily. 09/18/14  Yes Donne Hazel, MD  diphenoxylate-atropine (LOMOTIL) 2.5-0.025 MG per tablet Take 1 tablet by mouth 4 (four) times daily as needed for diarrhea or loose stools. Patient not taking: Reported on 10/05/2014 10/05/14   Ladell Pier, MD  HYDROcodone-acetaminophen (NORCO/VICODIN) 5-325 MG per tablet Take 1-2 tablets by mouth every 4 (four) hours as needed for moderate pain. Patient not taking: Reported on 09/23/2014 08/11/14   Ladell Pier, MD  megestrol (MEGACE) 40 MG/ML suspension Take 5 mLs (200 mg total) by mouth 2 (two) times daily. Patient not taking: Reported on 10/05/2014 10/05/14   Ladell Pier, MD   BP 100/72 mmHg  Pulse 70  Temp(Src) 102.2 F  (39 C) (Rectal)  Resp 16  Ht 6' (1.829 m)  SpO2 97% Physical Exam  Constitutional: He is oriented to person, place, and time.  Ill appearing, dehydrated   HENT:  Head: Normocephalic.  Mouth/Throat: Oropharynx is clear and moist.  Eyes: Conjunctivae are normal. Pupils are equal, round, and reactive to light.  Neck: Normal range of motion. Neck supple.  Cardiovascular: Regular rhythm and normal heart sounds.   Slightly tachy   Pulmonary/Chest: Effort normal.  Diminished bilateral bases   Abdominal:  Distended, nontender. Obvious abdominal cellulitis, no abscess  Musculoskeletal: Normal range of motion.  1+ edema bilateral legs   Neurological: He is alert and oriented to person, place, and time. No cranial nerve deficit. Coordination normal.  Skin: Skin is warm and dry.  Psychiatric: He has a normal mood and affect. His behavior is normal. Judgment  and thought content normal.  Nursing note and vitals reviewed.   ED Course  Procedures (including critical care time)  CRITICAL CARE Performed by: Darl Householder, Tory   Total critical care time: 30 min   Critical care time was exclusive of separately billable procedures and treating other patients.  Critical care was necessary to treat or prevent imminent or life-threatening deterioration.  Critical care was time spent personally by me on the following activities: development of treatment plan with patient and/or surrogate as well as nursing, discussions with consultants, evaluation of patient's response to treatment, examination of patient, obtaining history from patient or surrogate, ordering and performing treatments and interventions, ordering and review of laboratory studies, ordering and review of radiographic studies, pulse oximetry and re-evaluation of patient's condition.    Labs Review Labs Reviewed  CBC WITH DIFFERENTIAL/PLATELET - Abnormal; Notable for the following:    WBC 20.5 (*)    RBC 3.14 (*)    Hemoglobin 8.6 (*)     HCT 26.3 (*)    RDW 17.6 (*)    Platelets 434 (*)    Neutrophils Relative % 93 (*)    Lymphocytes Relative 3 (*)    Neutro Abs 19.1 (*)    Lymphs Abs 0.6 (*)    All other components within normal limits  I-STAT TROPOININ, ED - Abnormal; Notable for the following:    Troponin i, poc 0.87 (*)    All other components within normal limits  I-STAT CHEM 8, ED - Abnormal; Notable for the following:    Sodium 123 (*)    Chloride 94 (*)    Creatinine, Ser 0.50 (*)    Calcium, Ion 1.03 (*)    Hemoglobin 9.9 (*)    HCT 29.0 (*)    All other components within normal limits  I-STAT CG4 LACTIC ACID, ED - Abnormal; Notable for the following:    Lactic Acid, Venous 2.13 (*)    All other components within normal limits  CULTURE, BLOOD (ROUTINE X 2)  CULTURE, BLOOD (ROUTINE X 2)  CLOSTRIDIUM DIFFICILE BY PCR (NOT AT [facility])    Imaging Review Dg Chest 2 View  10/05/2014   CLINICAL DATA:  Fever and shortness of breath beginning today. History of colon carcinoma.  EXAM: CHEST  2 VIEW  COMPARISON:  Single view of the chest 08/18/2014.  FINDINGS: Port-A-Cath is again seen. There are small to moderate bilateral pleural effusions, greater on the right. Basilar airspace disease is also worse on the right. No pneumothorax is identified. Heart size is normal.  IMPRESSION: Right greater than left small to moderate bilateral pleural effusions with associated basilar airspace disease, likely atelectasis.   Electronically Signed   By: Inge Rise M.D.   On: 10/05/2014 14:28   Dg Abd 2 Views  10/05/2014   CLINICAL DATA:  Fever. Shortness of breath. Colostomy revision and bowel resection may 2016.  EXAM: ABDOMEN - 2 VIEW  COMPARISON:  09/08/2014  FINDINGS: Airspace opacities probably from pleural effusions of the lung bases.  Nondependent collection of gas in the right abdomen with air-fluid level on the left-side-down lateral decubitus views ; this does not track around the inferior liver edge, and I do not see  regular lines on the frontal projection, accordingly this may be gas within right-sided bowel, but I am not absolutely certain.  IMPRESSION: 1. Bibasilar pleural effusions with associated atelectasis/airspace opacity. 2. Air- fluid level non-dependently in the abdomen on the lateral decubitus view. The gas does not appear to track along  the liver edge and accordingly is probably within dilated bowel, but I am not 817% certain that this is intraluminal. CT abdomen recommended. These results will be called to the ordering clinician or representative by the Radiologist Assistant, and communication documented in the PACS or zVision Dashboard.   Electronically Signed   By: Van Clines M.D.   On: 10/05/2014 14:34     EKG Interpretation   Date/Time:  Monday October 05 2014 13:23:20 EDT Ventricular Rate:  95 PR Interval:  139 QRS Duration: 90 QT Interval:  341 QTC Calculation: 429 R Axis:   -168 Text Interpretation:  Right and left arm electrode reversal,  interpretation assumes no reversal Sinus rhythm Atrial premature complexes  Right axis deviation Low voltage, precordial leads RSR' in V1 or V2,  probably normal variant Abnormal T, consider ischemia, lateral leads No  significant change since last tracing Confirmed by Ramie Palladino  MD, [place] ([id number])  on 10/05/2014 2:12:29 PM      MDM   Final diagnoses:  Fever  Cellulitis of abdominal wall  HCAP (healthcare-associated pneumonia)  Abdominal pain   DRAEDEN KELLMAN is a 69 y.o. male here with fever, ab cellulitis. Concerned for sepsis. Chronically ill appearing. Code sepsis initiated. WBC 20 this AM. Started on vanc/zosyn.   2:30 PM Trop positive 0.87. Some ST changes lateral leads. No chest pain. Likely demand ischemia. Given ASA 325 mg. Held heparin. Abx given. CXR showed pneumonia. Vanc/zosyn will cover for HCAP. C diff ordered. Hospitalist consulted and will admit. Cardiology consulted to follow patient. Talked to Dr. Learta Codding, oncology, who will  see patient tomorrow.   3:04 PM Xray showed a lot of air, likely in bowel. Given hx of SBO, will order CT ab/pel and hospitalist to follow results.      Wandra Arthurs, MD 10/05/14 [place] Jasan Doughtie, MD 10/05/14 1437  Wandra Arthurs, MD 10/05/14 [phone]

## 2014-10-05 NOTE — Procedures (Signed)
CT 56f RLQ abscess drain placed Copious purulent material out, sample for GS C&S No complication No blood loss. See complete dictation in [facility].

## 2014-10-05 NOTE — ED Notes (Signed)
MD at bedside. 

## 2014-10-06 ENCOUNTER — Other Ambulatory Visit: Payer: Self-pay

## 2014-10-06 DIAGNOSIS — T8149XA Infection following a procedure, other surgical site, initial encounter: Secondary | ICD-10-CM

## 2014-10-06 DIAGNOSIS — T8143XA Infection following a procedure, organ and space surgical site, initial encounter: Secondary | ICD-10-CM

## 2014-10-06 DIAGNOSIS — C800 Disseminated malignant neoplasm, unspecified: Secondary | ICD-10-CM

## 2014-10-06 DIAGNOSIS — K651 Peritoneal abscess: Secondary | ICD-10-CM

## 2014-10-06 LAB — COMPREHENSIVE METABOLIC PANEL
ALK PHOS: 114 U/L (ref 38–126)
ALT: 16 U/L — ABNORMAL LOW (ref 17–63)
ANION GAP: 8 (ref 5–15)
AST: 18 U/L (ref 15–41)
Albumin: 1.2 g/dL — ABNORMAL LOW (ref 3.5–5.0)
BUN: 9 mg/dL (ref 6–20)
CO2: 21 mmol/L — AB (ref 22–32)
Calcium: 6.9 mg/dL — ABNORMAL LOW (ref 8.9–10.3)
Chloride: 98 mmol/L — ABNORMAL LOW (ref 101–111)
Creatinine, Ser: 0.43 mg/dL — ABNORMAL LOW (ref 0.61–1.24)
GFR calc Af Amer: >60 mL/min (ref >60)
Glucose, Bld: 116 mg/dL — ABNORMAL HIGH (ref 65–99)
POTASSIUM: 3.1 mmol/L — AB (ref 3.5–5.1)
SODIUM: 127 mmol/L — AB (ref 135–145)
Total Bilirubin: 0.5 mg/dL (ref 0.3–1.2)
Total Protein: 5.1 g/dL — ABNORMAL LOW (ref 6.5–8.1)

## 2014-10-06 LAB — BRAIN NATRIURETIC PEPTIDE: B NATRIURETIC PEPTIDE 5: 574.2 pg/mL — AB (ref 0.0–100.0)

## 2014-10-06 LAB — LACTIC ACID, PLASMA: Lactic Acid, Venous: 1.3 mmol/L (ref 0.5–2.0)

## 2014-10-06 LAB — CBC
HCT: 26.2 % — ABNORMAL LOW (ref 39.0–52.0)
HEMOGLOBIN: 8.5 g/dL — AB (ref 13.0–17.0)
MCH: 26.9 pg (ref 26.0–34.0)
MCHC: 32.4 g/dL (ref 30.0–36.0)
MCV: 82.9 fL (ref 78.0–100.0)
Platelets: 383 10*3/uL (ref 150–400)
RBC: 3.16 MIL/uL — ABNORMAL LOW (ref 4.22–5.81)
RDW: 17.4 % — ABNORMAL HIGH (ref 11.5–15.5)
WBC: 15.1 10*3/uL — AB (ref 4.0–10.5)

## 2014-10-06 LAB — MAGNESIUM: Magnesium: 1.4 mg/dL — ABNORMAL LOW (ref 1.7–2.4)

## 2014-10-06 MED ORDER — CHLORHEXIDINE GLUCONATE 0.12 % MT SOLN
15.0000 mL | Freq: Two times a day (BID) | OROMUCOSAL | Status: DC
  Administered 2014-10-06 – 2014-10-08 (×4): 15 mL via OROMUCOSAL
  Filled 2014-10-06 (×5): qty 15

## 2014-10-06 MED ORDER — MUPIROCIN 2 % EX OINT
TOPICAL_OINTMENT | CUTANEOUS | Status: AC
  Administered 2014-10-06: 1
  Filled 2014-10-06: qty 22

## 2014-10-06 MED ORDER — POTASSIUM CHLORIDE CRYS ER 20 MEQ PO TBCR
20.0000 meq | EXTENDED_RELEASE_TABLET | Freq: Three times a day (TID) | ORAL | Status: DC
  Administered 2014-10-06 – 2014-10-09 (×12): 20 meq via ORAL
  Filled 2014-10-06 (×11): qty 1

## 2014-10-06 MED ORDER — GI COCKTAIL ~~LOC~~
30.0000 mL | Freq: Four times a day (QID) | ORAL | Status: DC | PRN
  Administered 2014-10-06 – 2014-10-07 (×3): 30 mL via ORAL
  Filled 2014-10-06 (×4): qty 30

## 2014-10-06 MED ORDER — BOOST PLUS PO LIQD
237.0000 mL | Freq: Two times a day (BID) | ORAL | Status: DC
  Administered 2014-10-08 – 2014-10-09 (×2): 237 mL via ORAL
  Filled 2014-10-06 (×9): qty 237

## 2014-10-06 MED ORDER — SODIUM CHLORIDE 0.9 % IJ SOLN
10.0000 mL | INTRAMUSCULAR | Status: DC | PRN
  Administered 2014-10-08 – 2014-10-10 (×4): 10 mL
  Filled 2014-10-06 (×4): qty 40

## 2014-10-06 MED ORDER — HYDROCOD POLST-CPM POLST ER 10-8 MG/5ML PO SUER
5.0000 mL | Freq: Two times a day (BID) | ORAL | Status: DC | PRN
  Filled 2014-10-06: qty 5

## 2014-10-06 MED ORDER — SODIUM CHLORIDE 0.9 % IV BOLUS (SEPSIS)
500.0000 mL | Freq: Once | INTRAVENOUS | Status: AC
  Administered 2014-10-06: 500 mL via INTRAVENOUS

## 2014-10-06 MED ORDER — LIP MEDEX EX OINT
TOPICAL_OINTMENT | CUTANEOUS | Status: AC
  Administered 2014-10-06: 1
  Filled 2014-10-06: qty 7

## 2014-10-06 MED ORDER — SODIUM CHLORIDE 0.9 % IV BOLUS (SEPSIS)
1000.0000 mL | Freq: Once | INTRAVENOUS | Status: AC
  Administered 2014-10-06: 1000 mL via INTRAVENOUS

## 2014-10-06 MED ORDER — ENOXAPARIN SODIUM 40 MG/0.4ML ~~LOC~~ SOLN
40.0000 mg | SUBCUTANEOUS | Status: DC
  Administered 2014-10-06 – 2014-10-09 (×4): 40 mg via SUBCUTANEOUS
  Filled 2014-10-06 (×5): qty 0.4

## 2014-10-06 MED ORDER — DIPHENOXYLATE-ATROPINE 2.5-0.025 MG PO TABS
1.0000 | ORAL_TABLET | Freq: Four times a day (QID) | ORAL | Status: DC | PRN
  Administered 2014-10-06 – 2014-10-08 (×4): 1 via ORAL
  Filled 2014-10-06 (×4): qty 1

## 2014-10-06 MED ORDER — POTASSIUM CHLORIDE IN NACL 40-0.9 MEQ/L-% IV SOLN
INTRAVENOUS | Status: DC
  Administered 2014-10-06 – 2014-10-08 (×5): 75 mL/h via INTRAVENOUS
  Filled 2014-10-06 (×9): qty 1000

## 2014-10-06 MED ORDER — CETYLPYRIDINIUM CHLORIDE 0.05 % MT LIQD
7.0000 mL | Freq: Two times a day (BID) | OROMUCOSAL | Status: DC
  Administered 2014-10-06 – 2014-10-08 (×5): 7 mL via OROMUCOSAL

## 2014-10-06 MED ORDER — ADULT MULTIVITAMIN W/MINERALS CH
1.0000 | ORAL_TABLET | Freq: Every day | ORAL | Status: DC
  Administered 2014-10-06 – 2014-10-10 (×5): 1 via ORAL
  Filled 2014-10-06 (×5): qty 1

## 2014-10-06 MED ORDER — MAGNESIUM SULFATE 2 GM/50ML IV SOLN
2.0000 g | Freq: Once | INTRAVENOUS | Status: AC
  Administered 2014-10-06: 2 g via INTRAVENOUS
  Filled 2014-10-06: qty 50

## 2014-10-06 MED ORDER — PANTOPRAZOLE SODIUM 40 MG PO TBEC
40.0000 mg | DELAYED_RELEASE_TABLET | Freq: Two times a day (BID) | ORAL | Status: DC
  Administered 2014-10-06 – 2014-10-10 (×9): 40 mg via ORAL
  Filled 2014-10-06 (×9): qty 1

## 2014-10-06 MED ORDER — DIPHENOXYLATE-ATROPINE 2.5-0.025 MG/5ML PO LIQD: 5.0000 mL | Freq: Four times a day (QID) | ORAL | Status: DC | PRN

## 2014-10-06 NOTE — Progress Notes (Signed)
[facility] PROGRESS NOTE  EMIN FOREE ENI:[id number] DOB: [birth year] DOA: 10/05/2014 PCP: Gerrit Heck, MD Interim summary: Wesley Dillon is a 69 y.o. male with h/o adenocarcinoma carcinomatosis, CAD, anemia, hypertension, recently discharged from the hospital presents came in for fever , sob and generalized weakness.he was found to have a large abdominal abscess and possible HCAP and sepsis.  He was admitted to step down and surgery consulted.   Assessment/Plan:  Sepsis from abdominal abscess/ questionable HCAP/ Abdominal Wall cellulitis. : Admitted to step down.  Sepsis protocol in place. IV resuscitation t keep MAP greater than 65. IV antibiotics with IV vancomycin and IV zosyn.  Blood cultures drawn and pending.  On 3 lit [place] oxygen to keep sats greater than 90% Surgery consulted for management of the abdominal abscess. Underwent IR percutaneous drain placement for retroperitoneal abscess, 10/05/14.    Abdominal carcinomatosis: Recent surgery. In may 2016.  Further management as per Dr Learta Codding.   Elevated troponins: Probably from demand ischemia from sepsis.  Cardiology consulted and recommendations given.    Hyponatremia: Probably from anasarca and dehydration. Improved.    Hypokalemia: Replete as needed.repeat in am.    Leukocytosis: Probably from sepsis.    Anemia: Normocytic, anemia of chronic disease.    Diarrhea: Unclear etiology.  Recent use of antibiotics.  c diff pcr is negative.   Code Status: full code Family Communication: family at bedside Disposition Plan: remain in step down.   Consultants:  cardiology  Procedures: IR percutaneous drain placement for retroperitoneal abscess, 10/05/14  Antibiotics: IV vancomycin  IV zosyn 6 13 HPI/Subjective: Feeling mcu better than yesterday. No abdominal pain, still having diarrhea,   Objective: Filed Vitals:   10/06/14 1230  BP: 89/62  Pulse: 88  Temp:    Resp: 25    Intake/Output Summary (Last 24 hours) at 10/06/14 1544 Last data filed at 10/06/14 1537  Gross per 24 hour  Intake 4733.75 ml  Output   3660 ml  Net 1073.75 ml   Filed Weights   10/05/14 1845  Weight: 77.2 kg (170 lb 3.1 oz)    Exam:   General:  Alert afebrile sitting in the chair. Cachetic looking.   Cardiovascular: s1s2  Respiratory: diminished at bases, no wheezing orr rhonchi  Abdomen: little soft, still distended, no tenderness, erythema persists. Drain in place with yellow colored fluid draining out.   Musculoskeletal: no pedal edema.   Data Reviewed: Basic Metabolic Panel:  Recent Labs Lab 10/05/14 0837 10/05/14 1350 10/06/14 0545  NA 127* 123* 127*  K 3.4* 3.6 3.1*  CL  --  94* 98*  CO2 20*  --  21*  GLUCOSE 97 95 116*  BUN 7.3 7 9   CREATININE 0.6* 0.50* 0.43*  CALCIUM 7.6*  --  6.9*  MG  --   --  1.4*   Liver Function Tests:  Recent Labs Lab 10/05/14 0837 10/06/14 0545  AST 20 18  ALT 17 16*  ALKPHOS 165* 114  BILITOT 0.72 0.5  PROT 6.0* 5.1*  ALBUMIN 1.3* 1.2*   No results for input(s): LIPASE, AMYLASE in the last 168 hours. No results for input(s): AMMONIA in the last 168 hours. CBC:  Recent Labs Lab 10/05/14 0837 10/05/14 1350 10/06/14 0545  WBC 20.3* 20.5* 15.1*  NEUTROABS 18.2* 19.1*  --   HGB 8.8* 8.6*  9.9* 8.5*  HCT 27.2* 26.3*  29.0* 26.2*  MCV 82.4 83.8 82.9  PLT 378 434* 383   Cardiac Enzymes: No results for input(s): CKTOTAL,  CKMB, CKMBINDEX, TROPONINI in the last 168 hours. BNP (last 3 results)  Recent Labs  10/06/14 0545  BNP 574.2*    ProBNP (last 3 results) No results for input(s): PROBNP in the last 8760 hours.  CBG: No results for input(s): GLUCAP in the last 168 hours.  Recent Results (from the past 240 hour(s))  Blood culture (routine x 2)     Status: None (Preliminary result)   Collection Time: 10/05/14  1:53 PM  Result Value Ref Range Status   Specimen Description BLOOD LEFT  ANTECUBITAL  Final   Special Requests BOTTLES DRAWN AEROBIC AND ANAEROBIC 5CC EACH  Final   Culture   Final           BLOOD CULTURE RECEIVED NO GROWTH TO DATE CULTURE WILL BE HELD FOR 5 DAYS BEFORE ISSUING A FINAL NEGATIVE REPORT Performed at [facility]    Report Status PENDING  Incomplete  Blood culture (routine x 2)     Status: None (Preliminary result)   Collection Time: 10/05/14  1:55 PM  Result Value Ref Range Status   Specimen Description BLOOD RIGHT ANTECUBITAL  Final   Special Requests BOTTLES DRAWN AEROBIC AND ANAEROBIC 5CC EACH  Final   Culture   Final           BLOOD CULTURE RECEIVED NO GROWTH TO DATE CULTURE WILL BE HELD FOR 5 DAYS BEFORE ISSUING A FINAL NEGATIVE REPORT Performed at [facility]    Report Status PENDING  Incomplete  Clostridium Difficile by PCR (not at [facility])     Status: None   Collection Time: 10/05/14  7:45 PM  Result Value Ref Range Status   C difficile by pcr NEGATIVE NEGATIVE Final  Culture, routine-abscess     Status: None (Preliminary result)   Collection Time: 10/05/14  9:57 PM  Result Value Ref Range Status   Specimen Description DRAINAGE  Final   Special Requests Normal  Final   Gram Stain   Final    FEW WBC PRESENT, PREDOMINANTLY PMN NO SQUAMOUS EPITHELIAL CELLS SEEN ABUNDANT GRAM NEGATIVE RODS ABUNDANT GRAM POSITIVE COCCI IN PAIRS FEW GRAM POSITIVE RODS    Culture NO GROWTH Performed at [facility]   Final   Report Status PENDING  Incomplete     Studies: Dg Chest 2 View  10/05/2014   CLINICAL DATA:  Fever and shortness of breath beginning today. History of colon carcinoma.  EXAM: CHEST  2 VIEW  COMPARISON:  Single view of the chest 08/18/2014.  FINDINGS: Port-A-Cath is again seen. There are small to moderate bilateral pleural effusions, greater on the right. Basilar airspace disease is also worse on the right. No pneumothorax is identified. Heart size is normal.  IMPRESSION: Right greater than left small to  moderate bilateral pleural effusions with associated basilar airspace disease, likely atelectasis.   Electronically Signed   By: Inge Rise M.D.   On: 10/05/2014 14:28   Ct Angio Chest Pe W/cm &/or Wo Cm  10/05/2014   CLINICAL DATA:  Acute onset of generalized abdominal pain and weakness. Hypotension. Diarrhea. Hypoxia. Significantly elevated D-dimer. Current history of adenocarcinoma with carcinomatosis, status post chemotherapy. Initial encounter.  EXAM: CT ANGIOGRAPHY CHEST  CT ABDOMEN AND PELVIS WITH CONTRAST  TECHNIQUE: Multidetector CT imaging of the chest was performed using the standard protocol during bolus administration of intravenous contrast. Multiplanar CT image reconstructions and MIPs were obtained to evaluate the vascular anatomy. Multidetector CT imaging of the abdomen and pelvis was performed using the  standard protocol during bolus administration of intravenous contrast.  CONTRAST:  185mL OMNIPAQUE IOHEXOL 350 MG/ML SOLN  COMPARISON:  Abdominal radiographs performed earlier today at 2:12 p.m., and CT of the abdomen and pelvis performed 09/08/2014. CT of the chest performed 03/16/2014  FINDINGS: CTA CHEST FINDINGS  There is no evidence of pulmonary embolus.  Moderate bilateral pleural effusions are seen, with underlying partial consolidation of both lower lung lobes. The expanded portions of both lungs appear relatively clear. There is no evidence of pneumothorax. No masses are identified; no abnormal focal contrast enhancement is seen.  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 The great vessels are grossly unremarkable in appearance. No pericardial effusion is identified. No definite mediastinal lymphadenopathy is seen.  Scattered coronary artery calcifications are noted. There is suggestion of mild diffuse wall thickening along the esophagus, with associated edema, raising concern for some degree  of esophagitis. No axillary lymphadenopathy is seen. The thyroid gland is unremarkable in appearance. A right-sided chest port is noted ending about the distal SVC.  No acute osseous abnormalities are seen.  CT ABDOMEN and PELVIS FINDINGS  There appears to be a very large peripherally enhancing evolving abscess at the right side of the abdomen, partially contiguous with additional collections of fluid throughout the remainder of the abdomen and pelvis. This measures approximately 30.9 x 12.2 x 20.0 cm, extending from the level of the liver down into the pelvis. This contains a large amount of fluid and air. Given that the surgery was a month ago, this is unlikely to reflect residual air. There is no evidence of extravasation of contrast into the collection to suggest a perforated viscus, though it cannot be excluded; contrast appears to extend normally through the visualized bowel anastomoses and into the sigmoid colon. A gas-producing organism is thought to be the most likely etiology of the air.  Underlying moderate to large volume ascites is noted within the abdomen and pelvis; the underlying ascites has lower attenuation than the fluid within the abscess.  There appears to be prominence of intrahepatic biliary ducts and underlying periportal edema, though the common bile duct is not definitely enlarged. This is of uncertain significance. 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 The spleen is unremarkable in appearance. The superior aspect of the abscess demonstrates some degree of mass effect on the inferior aspect of the liver. A likely small stone is noted along the lateral gallbladder wall. The gallbladder is otherwise grossly unremarkable. The pancreas and adrenal glands are unremarkable.  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 The stomach is grossly unremarkable in appearance, though difficult to fully assess at the level of the hepatic hilum.  A 2.2 cm cyst is noted at the upper  pole of the left kidney. The kidneys are otherwise unremarkable. There is no evidence of hydronephrosis. No renal or ureteral stones are seen. No perinephric stranding is appreciated.  There is vague diffuse persistent or recurrent edema involving multiple small bowel loops at the mid and lower abdomen. There is also diffuse nodular mucosal thickening along the remaining ileum extending to the level of the patient's ileocolic anastomosis, and more mild mucosal edema along the distal descending and sigmoid colon. Some of these bowel loops are largely surrounded by the abscess. This may reflect lymphatic congestion, ischemia or infection. Underlying tumor infiltration cannot be excluded, as previously noted. The edematous bowel loops still demonstrate minimal enhancement,  without evidence for dead bowel at this time.  No acute vascular abnormalities are seen. Mild scattered calcification is noted along the abdominal aorta and its branches.  The bladder is mildly distended and grossly unremarkable in appearance. The prostate is enlarged, measuring 5.5 cm in transverse dimension. No inguinal lymphadenopathy is seen.  No acute osseous abnormalities are identified.  Review of the MIP images confirms the above findings.  IMPRESSION: 1. Very large peripherally enhancing evolving abscess at the right side of the abdomen, partially contiguous with additional collections of fluid throughout the remainder of the abdomen and pelvis. This measures 30.9 x 12.2 x 20.0 cm, extending from the level of the liver down into the pelvis, and contains a large amount of fluid and air. No evidence of extravasation of contrast into the collection to suggest a perforated viscus, though it cannot be excluded. Contrast appears to extend normally through the visualized bowel anastomoses. A gas-producing organism is thought to be the most likely etiology of the air. Given the size of the abscess and surrounding ascites, this is likely too large  for successful interventional drainage. Surgical consultation is suggested, as deemed clinically appropriate. 2. Underlying moderate to large volume ascites within the abdomen and pelvis. 3. Vague persistent or recurrent diffuse edema involving multiple small bowel loops at the mid and lower abdomen. Diffuse nodular mucosal thickening along the remaining ileum extending to the ileocolic anastomosis, and more mild mucosal edema along the distal descending and sigmoid colon. This may reflect lymphatic congestion, ischemia or infection. As previously noted, underlying tumor infiltration cannot be excluded. The edematous bowel loops still demonstrate minimal enhancement, without definite dead bowel at this time. 4. No evidence of pulmonary embolus. 5. Moderate bilateral pleural effusions, with underlying partial consolidation of both lower lung lobes. 6. Suggestion of mild diffuse wall thickening along the esophagus, with associated edema, raising concern for some degree of esophagitis. 7. Prominence of the intrahepatic biliary ducts and underlying periportal edema, somewhat more prominent than on the prior study, though the common bile duct is not definitely enlarged. This is of uncertain significance, though it could reflect some degree of obstruction at the hepatic hilum. Underlying cavernous transformation of the portal vein noted. 8. 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 9. 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 If and when deemed clinically appropriate, would recommend semi-annual imaging followup by CTA or MRA, with elective referral to cardiothoracic surgery if not already obtained. This recommendation follows 2010 ACCF/AHA/AATS/ACR/ASA/SCA/SCAI/SIR/STS/SVM Guidelines for the Diagnosis and Management of Patients With Thoracic Aortic Disease. Circulation. 2010; 121: D622-W979 10. Scattered coronary artery calcifications seen. 11. Cholelithiasis; gallbladder  otherwise unremarkable. 12. Small left renal cyst noted. 13. Mild scattered calcification along the abdominal aorta and its branches. 14. Enlarged prostate noted.  These results were called by telephone at the time of interpretation on 10/05/2014 at 6:50 pm to Dr. Hosie Poisson, who verbally acknowledged these results.   Electronically Signed   By: Garald Balding M.D.   On: 10/05/2014 19:11   Ct Abdomen Pelvis W Contrast  10/05/2014   CLINICAL DATA:  Acute onset of generalized abdominal pain and weakness. Hypotension. Diarrhea. Hypoxia. Significantly elevated D-dimer. Current history of adenocarcinoma with carcinomatosis, status post chemotherapy. Initial encounter.  EXAM: CT ANGIOGRAPHY CHEST  CT ABDOMEN AND PELVIS WITH CONTRAST  TECHNIQUE: Multidetector CT imaging of the chest was performed using the standard protocol during bolus administration of intravenous contrast. Multiplanar CT image reconstructions and MIPs were obtained  to evaluate the vascular anatomy. Multidetector CT imaging of the abdomen and pelvis was performed using the standard protocol during bolus administration of intravenous contrast.  CONTRAST:  149mL OMNIPAQUE IOHEXOL 350 MG/ML SOLN  COMPARISON:  Abdominal radiographs performed earlier today at 2:12 p.m., and CT of the abdomen and pelvis performed 09/08/2014. CT of the chest performed 03/16/2014  FINDINGS: CTA CHEST FINDINGS  There is no evidence of pulmonary embolus.  Moderate bilateral pleural effusions are seen, with underlying partial consolidation of both lower lung lobes. The expanded portions of both lungs appear relatively clear. There is no evidence of pneumothorax. No masses are identified; no abnormal focal contrast enhancement is seen.  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 The great vessels are grossly unremarkable in appearance. No pericardial effusion is identified. No definite  mediastinal lymphadenopathy is seen.  Scattered coronary artery calcifications are noted. There is suggestion of mild diffuse wall thickening along the esophagus, with associated edema, raising concern for some degree of esophagitis. No axillary lymphadenopathy is seen. The thyroid gland is unremarkable in appearance. A right-sided chest port is noted ending about the distal SVC.  No acute osseous abnormalities are seen.  CT ABDOMEN and PELVIS FINDINGS  There appears to be a very large peripherally enhancing evolving abscess at the right side of the abdomen, partially contiguous with additional collections of fluid throughout the remainder of the abdomen and pelvis. This measures approximately 30.9 x 12.2 x 20.0 cm, extending from the level of the liver down into the pelvis. This contains a large amount of fluid and air. Given that the surgery was a month ago, this is unlikely to reflect residual air. There is no evidence of extravasation of contrast into the collection to suggest a perforated viscus, though it cannot be excluded; contrast appears to extend normally through the visualized bowel anastomoses and into the sigmoid colon. A gas-producing organism is thought to be the most likely etiology of the air.  Underlying moderate to large volume ascites is noted within the abdomen and pelvis; the underlying ascites has lower attenuation than the fluid within the abscess.  There appears to be prominence of intrahepatic biliary ducts and underlying periportal edema, though the common bile duct is not definitely enlarged. This is of uncertain significance. 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 The spleen is unremarkable in appearance. The superior aspect of the abscess demonstrates some degree of mass effect on the inferior aspect of the liver. A likely small stone is noted along the lateral gallbladder wall. The gallbladder is otherwise grossly unremarkable. The pancreas and adrenal glands  are unremarkable.  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 The stomach is grossly unremarkable in appearance, though difficult to fully assess at the level of the hepatic hilum.  A 2.2 cm cyst is noted at the upper pole of the left kidney. The kidneys are otherwise unremarkable. There is no evidence of hydronephrosis. No renal or ureteral stones are seen. No perinephric stranding is appreciated.  There is vague diffuse persistent or recurrent edema involving multiple small bowel loops at the mid and lower abdomen. There is also diffuse nodular mucosal thickening along the remaining ileum extending to the level of the patient's ileocolic anastomosis, and more mild mucosal edema along the distal descending and sigmoid colon. Some of these bowel loops are largely surrounded by the abscess. This may reflect lymphatic congestion, ischemia or infection.  Underlying tumor infiltration cannot be excluded, as previously noted. The edematous bowel loops still demonstrate minimal enhancement, without evidence for dead bowel at this time.  No acute vascular abnormalities are seen. Mild scattered calcification is noted along the abdominal aorta and its branches.  The bladder is mildly distended and grossly unremarkable in appearance. The prostate is enlarged, measuring 5.5 cm in transverse dimension. No inguinal lymphadenopathy is seen.  No acute osseous abnormalities are identified.  Review of the MIP images confirms the above findings.  IMPRESSION: 1. Very large peripherally enhancing evolving abscess at the right side of the abdomen, partially contiguous with additional collections of fluid throughout the remainder of the abdomen and pelvis. This measures 30.9 x 12.2 x 20.0 cm, extending from the level of the liver down into the pelvis, and contains a large amount of fluid and air. No evidence of extravasation of contrast into the collection to suggest a perforated viscus, though it cannot be excluded. Contrast appears to  extend normally through the visualized bowel anastomoses. A gas-producing organism is thought to be the most likely etiology of the air. Given the size of the abscess and surrounding ascites, this is likely too large for successful interventional drainage. Surgical consultation is suggested, as deemed clinically appropriate. 2. Underlying moderate to large volume ascites within the abdomen and pelvis. 3. Vague persistent or recurrent diffuse edema involving multiple small bowel loops at the mid and lower abdomen. Diffuse nodular mucosal thickening along the remaining ileum extending to the ileocolic anastomosis, and more mild mucosal edema along the distal descending and sigmoid colon. This may reflect lymphatic congestion, ischemia or infection. As previously noted, underlying tumor infiltration cannot be excluded. The edematous bowel loops still demonstrate minimal enhancement, without definite dead bowel at this time. 4. No evidence of pulmonary embolus. 5. Moderate bilateral pleural effusions, with underlying partial consolidation of both lower lung lobes. 6. Suggestion of mild diffuse wall thickening along the esophagus, with associated edema, raising concern for some degree of esophagitis. 7. Prominence of the intrahepatic biliary ducts and underlying periportal edema, somewhat more prominent than on the prior study, though the common bile duct is not definitely enlarged. This is of uncertain significance, though it could reflect some degree of obstruction at the hepatic hilum. Underlying cavernous transformation of the portal vein noted. 8. 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 9. 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 If and when deemed clinically appropriate, would recommend semi-annual imaging followup by CTA or MRA, with elective referral to cardiothoracic surgery if not already obtained. This recommendation follows 2010  ACCF/AHA/AATS/ACR/ASA/SCA/SCAI/SIR/STS/SVM Guidelines for the Diagnosis and Management of Patients With Thoracic Aortic Disease. Circulation. 2010; 121: W098-J191 10. Scattered coronary artery calcifications seen. 11. Cholelithiasis; gallbladder otherwise unremarkable. 12. Small left renal cyst noted. 13. Mild scattered calcification along the abdominal aorta and its branches. 14. Enlarged prostate noted.  These results were called by telephone at the time of interpretation on 10/05/2014 at 6:50 pm to Dr. Hosie Poisson, who verbally acknowledged these results.   Electronically Signed   By: Garald Balding M.D.   On: 10/05/2014 19:11   Dg Abd 2 Views  10/05/2014   CLINICAL DATA:  Fever. Shortness of breath. Colostomy revision and bowel resection may 2016.  EXAM: ABDOMEN - 2 VIEW  COMPARISON:  09/08/2014  FINDINGS: Airspace opacities probably from pleural effusions of the lung bases.  Nondependent collection of gas in the right abdomen with air-fluid level on the left-side-down lateral  decubitus views ; this does not track around the inferior liver edge, and I do not see regular lines on the frontal projection, accordingly this may be gas within right-sided bowel, but I am not absolutely certain.  IMPRESSION: 1. Bibasilar pleural effusions with associated atelectasis/airspace opacity. 2. Air- fluid level non-dependently in the abdomen on the lateral decubitus view. The gas does not appear to track along the liver edge and accordingly is probably within dilated bowel, but I am not 353% certain that this is intraluminal. CT abdomen recommended. These results will be called to the ordering clinician or representative by the Radiologist Assistant, and communication documented in the PACS or zVision Dashboard.   Electronically Signed   By: Van Clines M.D.   On: 10/05/2014 14:34   Ct Image Guided Drainage Percut Cath  Peritoneal Retroperit  10/06/2014   CLINICAL DATA:  Abdominal carcinomatosis post laparotomy for  bowel obstruction. Now presents with large intra-abdominal abscess.  EXAM: CT GUIDED DRAINAGE OF PERITONEAL ABSCESS  ANESTHESIA/SEDATION: Intravenous Versed were administered as conscious sedation during continuous cardiorespiratory monitoring by the ICU RN, with a total moderate sedation time of less than 30 minutes.  PROCEDURE: The procedure, risks, benefits, and alternatives were explained to the patient. Questions regarding the procedure were encouraged and answered. The patient understands and consents to the procedure.  Select axial scans through the abdomen were obtained. The collection was localized and an appropriate skin entry site was determined and marked.  The operative field was prepped with Betadinein a sterile fashion, and a sterile drape was applied covering the operative field. A sterile gown and sterile gloves were used for the procedure. Local anesthesia was provided with 1% Lidocaine.  Under CT fluoroscopic guidance, a 19 gauge percutaneous entry needle was advanced into the collection. Purulent material returned. An Amplatz guidewire advanced easily into the collection. Tract dilated to allow placement of a 12 French pigtail catheter, placed within the central aspect of the collection. Catheter position confirmed on CT. Catheter secured externally with 0 Prolene suture and StatLock and placed to gravity bag. 1700 mL of fluid were removed during the procedure. A sample sent for Gram stain, culture and sensitivity.  COMPLICATIONS: None immediate  FINDINGS: Limited CT confirms large gas and fluid loculated collection in the right peritoneal cavity. 12 French abscess drain catheter placed under CT guidance.  IMPRESSION: 1. Technically successful CT-guided peritoneal abscess drain catheter placement.   Electronically Signed   By: Lucrezia Europe M.D.   On: 10/06/2014 09:20    Scheduled Meds: . enoxaparin (LOVENOX) injection  40 mg Subcutaneous Q24H  . lactose free nutrition  237 mL Oral BID BM  .  megestrol  200 mg Oral BID  . midazolam  1 mg Intravenous Once  . multivitamin with minerals  1 tablet Oral Daily  . pantoprazole  40 mg Oral BID  . piperacillin-tazobactam (ZOSYN)  IV  3.375 g Intravenous 3 times per day  . potassium chloride  20 mEq Oral TID PC  . saccharomyces boulardii  250 mg Oral BID  . sodium chloride  1,000 mL Intravenous Once  . vancomycin  1,000 mg Intravenous Q12H   Continuous Infusions: . 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

## 2014-10-06 NOTE — Progress Notes (Signed)
Subjective: He says he feels better.  Drain is still putting out allot of purulent fluid.  Objective: Vital signs in last 24 hours: Temp:  [98 F (36.7 C)-102.2 F (39 C)] 98 F (36.7 C) (06/14 0800) Pulse Rate:  [31-104] 83 (06/14 0800) Resp:  [15-29] 22 (06/14 0800) BP: (62-128)/(33-73) 108/73 mmHg (06/14 0800) SpO2:  [95 %-100 %] 98 % (06/14 0800) Weight:  [77.2 kg (170 lb 3.1 oz)-78.654 kg (173 lb 6.4 oz)] 77.2 kg (170 lb 3.1 oz) (06/13 1845) Last BM Date: 10/06/14   Drain 2160 recorded 4 BM's recorded  Having allot of diarrhea, C Diff negative 24 hours ago. TM 102 on admit, BP down last PM, but currently improving Sats OK on nasal cannula Na 127, K+ 3.1, BNP 574 WBC 15.1 H/H 8.5/26 Platelets 383K Diet:  Full liquids  Intake/Output from previous day: 06/13 0701 - 06/14 0700 In: 4303.8 [P.O.:150; I.V.:843.8; IV Piggyback:3300] Out: 6073 [Urine:1100; Drains:2160] Intake/Output this shift:    General appearance: alert, cooperative and he is feeling better, but does not look all that comfortable. Resp: clear to auscultation bilaterally and anterior exam GI: distended, few BS, drain in place with white thin purulent fluid coming from drain, bag is currently about 1/3 full.  Lab Results:  Ir abdominal drainage fluid  No growth on culture obtained last PM FEW WBC PRESENT, PREDOMINANTLY PMN  NO SQUAMOUS EPITHELIAL CELLS SEEN  ABUNDANT GRAM NEGATIVE RODS  ABUNDANT GRAM POSITIVE COCCI  IN PAIRS FEW GRAM POSITIVE RODS           Recent Labs  10/05/14 1350 10/06/14 0545  WBC 20.5* 15.1*  HGB 8.6*  9.9* 8.5*  HCT 26.3*  29.0* 26.2*  PLT 434* 383    BMET  Recent Labs  10/05/14 0837 10/05/14 1350 10/06/14 0545  NA 127* 123* 127*  K 3.4* 3.6 3.1*  CL  --  94* 98*  CO2 20*  --  21*  GLUCOSE 97 95 116*  BUN 7.3 7 9   CREATININE 0.6* 0.50* 0.43*  CALCIUM 7.6*  --  6.9*   PT/INR  Recent Labs  10/05/14 1942  LABPROT 17.9*  INR 1.47     Recent  Labs Lab 10/05/14 0837 10/06/14 0545  AST 20 18  ALT 17 16*  ALKPHOS 165* 114  BILITOT 0.72 0.5  PROT 6.0* 5.1*  ALBUMIN 1.3* 1.2*     Lipase     Component Value Date/Time   LIPASE 32 09/06/2014 0932     Studies/Results: Dg Chest 2 View  10/05/2014   CLINICAL DATA:  Fever and shortness of breath beginning today. History of colon carcinoma.  EXAM: CHEST  2 VIEW  COMPARISON:  Single view of the chest 08/18/2014.  FINDINGS: Port-A-Cath is again seen. There are small to moderate bilateral pleural effusions, greater on the right. Basilar airspace disease is also worse on the right. No pneumothorax is identified. Heart size is normal.  IMPRESSION: Right greater than left small to moderate bilateral pleural effusions with associated basilar airspace disease, likely atelectasis.   Electronically Signed   By: Inge Rise M.D.   On: 10/05/2014 14:28   Ct Angio Chest Pe W/cm &/or Wo Cm  10/05/2014   CLINICAL DATA:  Acute onset of generalized abdominal pain and weakness. Hypotension. Diarrhea. Hypoxia. Significantly elevated D-dimer. Current history of adenocarcinoma with carcinomatosis, status post chemotherapy. Initial encounter.  EXAM: CT ANGIOGRAPHY CHEST  CT ABDOMEN AND PELVIS WITH CONTRAST  TECHNIQUE: Multidetector CT imaging of the chest was performed  using the standard protocol during bolus administration of intravenous contrast. Multiplanar CT image reconstructions and MIPs were obtained to evaluate the vascular anatomy. Multidetector CT imaging of the abdomen and pelvis was performed using the standard protocol during bolus administration of intravenous contrast.  CONTRAST:  119mL OMNIPAQUE IOHEXOL 350 MG/ML SOLN  COMPARISON:  Abdominal radiographs performed earlier today at 2:12 p.m., and CT of the abdomen and pelvis performed 09/08/2014. CT of the chest performed 03/16/2014  FINDINGS: CTA CHEST FINDINGS  There is no evidence of pulmonary embolus.  Moderate bilateral pleural effusions are  seen, with underlying partial consolidation of both lower lung lobes. The expanded portions of both lungs appear relatively clear. There is no evidence of pneumothorax. No masses are identified; no abnormal focal contrast enhancement is seen.  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 The great vessels are grossly unremarkable in appearance. No pericardial effusion is identified. No definite mediastinal lymphadenopathy is seen.  Scattered coronary artery calcifications are noted. There is suggestion of mild diffuse wall thickening along the esophagus, with associated edema, raising concern for some degree of esophagitis. No axillary lymphadenopathy is seen. The thyroid gland is unremarkable in appearance. A right-sided chest port is noted ending about the distal SVC.  No acute osseous abnormalities are seen.  CT ABDOMEN and PELVIS FINDINGS  There appears to be a very large peripherally enhancing evolving abscess at the right side of the abdomen, partially contiguous with additional collections of fluid throughout the remainder of the abdomen and pelvis. This measures approximately 30.9 x 12.2 x 20.0 cm, extending from the level of the liver down into the pelvis. This contains a large amount of fluid and air. Given that the surgery was a month ago, this is unlikely to reflect residual air. There is no evidence of extravasation of contrast into the collection to suggest a perforated viscus, though it cannot be excluded; contrast appears to extend normally through the visualized bowel anastomoses and into the sigmoid colon. A gas-producing organism is thought to be the most likely etiology of the air.  Underlying moderate to large volume ascites is noted within the abdomen and pelvis; the underlying ascites has lower attenuation than the fluid within the abscess.  There appears to be prominence of intrahepatic biliary ducts and  underlying periportal edema, though the common bile duct is not definitely enlarged. This is of uncertain significance. 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 The spleen is unremarkable in appearance. The superior aspect of the abscess demonstrates some degree of mass effect on the inferior aspect of the liver. A likely small stone is noted along the lateral gallbladder wall. The gallbladder is otherwise grossly unremarkable. The pancreas and adrenal glands are unremarkable.  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 The stomach is grossly unremarkable in appearance, though difficult to fully assess at the level of the hepatic hilum.  A 2.2 cm cyst is noted at the upper pole of the left kidney. The kidneys are otherwise unremarkable. There is no evidence of hydronephrosis. No renal or ureteral stones are seen. No perinephric stranding is appreciated.  There is vague diffuse persistent or recurrent edema involving multiple small bowel loops at the mid and lower abdomen. There is also diffuse nodular mucosal thickening along the remaining ileum extending to the level of the patient's ileocolic anastomosis, and more mild mucosal edema along the distal descending and sigmoid colon. Some  of these bowel loops are largely surrounded by the abscess. This may reflect lymphatic congestion, ischemia or infection. Underlying tumor infiltration cannot be excluded, as previously noted. The edematous bowel loops still demonstrate minimal enhancement, without evidence for dead bowel at this time.  No acute vascular abnormalities are seen. Mild scattered calcification is noted along the abdominal aorta and its branches.  The bladder is mildly distended and grossly unremarkable in appearance. The prostate is enlarged, measuring 5.5 cm in transverse dimension. No inguinal lymphadenopathy is seen.  No acute osseous abnormalities are identified.  Review of the MIP images confirms the above findings.   IMPRESSION: 1. Very large peripherally enhancing evolving abscess at the right side of the abdomen, partially contiguous with additional collections of fluid throughout the remainder of the abdomen and pelvis. This measures 30.9 x 12.2 x 20.0 cm, extending from the level of the liver down into the pelvis, and contains a large amount of fluid and air. No evidence of extravasation of contrast into the collection to suggest a perforated viscus, though it cannot be excluded. Contrast appears to extend normally through the visualized bowel anastomoses. A gas-producing organism is thought to be the most likely etiology of the air. Given the size of the abscess and surrounding ascites, this is likely too large for successful interventional drainage. Surgical consultation is suggested, as deemed clinically appropriate. 2. Underlying moderate to large volume ascites within the abdomen and pelvis. 3. Vague persistent or recurrent diffuse edema involving multiple small bowel loops at the mid and lower abdomen. Diffuse nodular mucosal thickening along the remaining ileum extending to the ileocolic anastomosis, and more mild mucosal edema along the distal descending and sigmoid colon. This may reflect lymphatic congestion, ischemia or infection. As previously noted, underlying tumor infiltration cannot be excluded. The edematous bowel loops still demonstrate minimal enhancement, without definite dead bowel at this time. 4. No evidence of pulmonary embolus. 5. Moderate bilateral pleural effusions, with underlying partial consolidation of both lower lung lobes. 6. Suggestion of mild diffuse wall thickening along the esophagus, with associated edema, raising concern for some degree of esophagitis. 7. Prominence of the intrahepatic biliary ducts and underlying periportal edema, somewhat more prominent than on the prior study, though the common bile duct is not definitely enlarged. This is of uncertain significance, though it could  reflect some degree of obstruction at the hepatic hilum. Underlying cavernous transformation of the portal vein noted. 8. 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 9. 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 If and when deemed clinically appropriate, would recommend semi-annual imaging followup by CTA or MRA, with elective referral to cardiothoracic surgery if not already obtained. This recommendation follows 2010 ACCF/AHA/AATS/ACR/ASA/SCA/SCAI/SIR/STS/SVM Guidelines for the Diagnosis and Management of Patients With Thoracic Aortic Disease. Circulation. 2010; 121: T062-I948 10. Scattered coronary artery calcifications seen. 11. Cholelithiasis; gallbladder otherwise unremarkable. 12. Small left renal cyst noted. 13. Mild scattered calcification along the abdominal aorta and its branches. 14. Enlarged prostate noted.  These results were called by telephone at the time of interpretation on 10/05/2014 at 6:50 pm to Dr. Hosie Poisson, who verbally acknowledged these results.   Electronically Signed   By: Garald Balding M.D.   On: 10/05/2014 19:11   Ct Abdomen Pelvis W Contrast  10/05/2014   CLINICAL DATA:  Acute onset of generalized abdominal pain and weakness. Hypotension. Diarrhea. Hypoxia. Significantly elevated D-dimer. Current history of adenocarcinoma with carcinomatosis, status post chemotherapy. Initial encounter.  EXAM: CT ANGIOGRAPHY CHEST  CT ABDOMEN AND PELVIS WITH CONTRAST  TECHNIQUE: Multidetector CT imaging of the chest was performed using the standard protocol during bolus administration of intravenous contrast. Multiplanar CT image reconstructions and MIPs were obtained to evaluate the vascular anatomy. Multidetector CT imaging of the abdomen and pelvis was performed using the standard protocol during bolus administration of intravenous contrast.  CONTRAST:  168mL OMNIPAQUE IOHEXOL 350 MG/ML SOLN  COMPARISON:  Abdominal radiographs performed earlier today at  2:12 p.m., and CT of the abdomen and pelvis performed 09/08/2014. CT of the chest performed 03/16/2014  FINDINGS: CTA CHEST FINDINGS  There is no evidence of pulmonary embolus.  Moderate bilateral pleural effusions are seen, with underlying partial consolidation of both lower lung lobes. The expanded portions of both lungs appear relatively clear. There is no evidence of pneumothorax. No masses are identified; no abnormal focal contrast enhancement is seen.  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 The great vessels are grossly unremarkable in appearance. No pericardial effusion is identified. No definite mediastinal lymphadenopathy is seen.  Scattered coronary artery calcifications are noted. There is suggestion of mild diffuse wall thickening along the esophagus, with associated edema, raising concern for some degree of esophagitis. No axillary lymphadenopathy is seen. The thyroid gland is unremarkable in appearance. A right-sided chest port is noted ending about the distal SVC.  No acute osseous abnormalities are seen.  CT ABDOMEN and PELVIS FINDINGS  There appears to be a very large peripherally enhancing evolving abscess at the right side of the abdomen, partially contiguous with additional collections of fluid throughout the remainder of the abdomen and pelvis. This measures approximately 30.9 x 12.2 x 20.0 cm, extending from the level of the liver down into the pelvis. This contains a large amount of fluid and air. Given that the surgery was a month ago, this is unlikely to reflect residual air. There is no evidence of extravasation of contrast into the collection to suggest a perforated viscus, though it cannot be excluded; contrast appears to extend normally through the visualized bowel anastomoses and into the sigmoid colon. A gas-producing organism is thought to be the most likely etiology of the air.  Underlying  moderate to large volume ascites is noted within the abdomen and pelvis; the underlying ascites has lower attenuation than the fluid within the abscess.  There appears to be prominence of intrahepatic biliary ducts and underlying periportal edema, though the common bile duct is not definitely enlarged. This is of uncertain significance. 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 The spleen is unremarkable in appearance. The superior aspect of the abscess demonstrates some degree of mass effect on the inferior aspect of the liver. A likely small stone is noted along the lateral gallbladder wall. The gallbladder is otherwise grossly unremarkable. The pancreas and adrenal glands are unremarkable.  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 The stomach is grossly unremarkable in appearance, though difficult to fully assess at the level of the hepatic hilum.  A 2.2 cm cyst is noted at the upper pole of the left kidney. The kidneys are otherwise unremarkable. There is no evidence of hydronephrosis. No renal or ureteral stones are seen. No perinephric stranding is appreciated.  There is vague diffuse persistent or recurrent edema involving multiple small bowel loops at the mid and lower abdomen. There is also diffuse nodular mucosal thickening along the remaining ileum extending to the level of the  patient's ileocolic anastomosis, and more mild mucosal edema along the distal descending and sigmoid colon. Some of these bowel loops are largely surrounded by the abscess. This may reflect lymphatic congestion, ischemia or infection. Underlying tumor infiltration cannot be excluded, as previously noted. The edematous bowel loops still demonstrate minimal enhancement, without evidence for dead bowel at this time.  No acute vascular abnormalities are seen. Mild scattered calcification is noted along the abdominal aorta and its branches.  The bladder is mildly distended and grossly unremarkable in appearance.  The prostate is enlarged, measuring 5.5 cm in transverse dimension. No inguinal lymphadenopathy is seen.  No acute osseous abnormalities are identified.  Review of the MIP images confirms the above findings.  IMPRESSION: 1. Very large peripherally enhancing evolving abscess at the right side of the abdomen, partially contiguous with additional collections of fluid throughout the remainder of the abdomen and pelvis. This measures 30.9 x 12.2 x 20.0 cm, extending from the level of the liver down into the pelvis, and contains a large amount of fluid and air. No evidence of extravasation of contrast into the collection to suggest a perforated viscus, though it cannot be excluded. Contrast appears to extend normally through the visualized bowel anastomoses. A gas-producing organism is thought to be the most likely etiology of the air. Given the size of the abscess and surrounding ascites, this is likely too large for successful interventional drainage. Surgical consultation is suggested, as deemed clinically appropriate. 2. Underlying moderate to large volume ascites within the abdomen and pelvis. 3. Vague persistent or recurrent diffuse edema involving multiple small bowel loops at the mid and lower abdomen. Diffuse nodular mucosal thickening along the remaining ileum extending to the ileocolic anastomosis, and more mild mucosal edema along the distal descending and sigmoid colon. This may reflect lymphatic congestion, ischemia or infection. As previously noted, underlying tumor infiltration cannot be excluded. The edematous bowel loops still demonstrate minimal enhancement, without definite dead bowel at this time. 4. No evidence of pulmonary embolus. 5. Moderate bilateral pleural effusions, with underlying partial consolidation of both lower lung lobes. 6. Suggestion of mild diffuse wall thickening along the esophagus, with associated edema, raising concern for some degree of esophagitis. 7. Prominence of the  intrahepatic biliary ducts and underlying periportal edema, somewhat more prominent than on the prior study, though the common bile duct is not definitely enlarged. This is of uncertain significance, though it could reflect some degree of obstruction at the hepatic hilum. Underlying cavernous transformation of the portal vein noted. 8. 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 9. 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 If and when deemed clinically appropriate, would recommend semi-annual imaging followup by CTA or MRA, with elective referral to cardiothoracic surgery if not already obtained. This recommendation follows 2010 ACCF/AHA/AATS/ACR/ASA/SCA/SCAI/SIR/STS/SVM Guidelines for the Diagnosis and Management of Patients With Thoracic Aortic Disease. Circulation. 2010; 121: E332-R518 10. Scattered coronary artery calcifications seen. 11. Cholelithiasis; gallbladder otherwise unremarkable. 12. Small left renal cyst noted. 13. Mild scattered calcification along the abdominal aorta and its branches. 14. Enlarged prostate noted.  These results were called by telephone at the time of interpretation on 10/05/2014 at 6:50 pm to Dr. Hosie Poisson, who verbally acknowledged these results.   Electronically Signed   By: Garald Balding M.D.   On: 10/05/2014 19:11   Dg Abd 2 Views  10/05/2014   CLINICAL DATA:  Fever. Shortness of breath. Colostomy revision and bowel resection may 2016.  EXAM: ABDOMEN -  2 VIEW  COMPARISON:  09/08/2014  FINDINGS: Airspace opacities probably from pleural effusions of the lung bases.  Nondependent collection of gas in the right abdomen with air-fluid level on the left-side-down lateral decubitus views ; this does not track around the inferior liver edge, and I do not see regular lines on the frontal projection, accordingly this may be gas within right-sided bowel, but I am not absolutely certain.  IMPRESSION: 1. Bibasilar pleural effusions with  associated atelectasis/airspace opacity. 2. Air- fluid level non-dependently in the abdomen on the lateral decubitus view. The gas does not appear to track along the liver edge and accordingly is probably within dilated bowel, but I am not 010% certain that this is intraluminal. CT abdomen recommended. These results will be called to the ordering clinician or representative by the Radiologist Assistant, and communication documented in the PACS or zVision Dashboard.   Electronically Signed   By: Van Clines M.D.   On: 10/05/2014 14:34    Medications: . megestrol  200 mg Oral BID  . midazolam  1 mg Intravenous Once  . pantoprazole  40 mg Oral BID  . piperacillin-tazobactam (ZOSYN)  IV  3.375 g Intravenous 3 times per day  . saccharomyces boulardii  250 mg Oral BID  . vancomycin  1,000 mg Intravenous Q12H   . sodium chloride 75 mL/hr at 10/06/14 0213    Assessment/Plan Fever, SOB, generalized weakness,  Abdominal Carcinomatosis with recurrent obstruction S/p exploratory laparotomy, small bowel bypass, and colon resection 09/09/14 Dr. Excell Seltzer  Sepsis from Intraabdominal abscess/ questionable HCAP/ ABDOMINAL WALL cellulitis. : IR percutaneous drain placement for retroperitoneal abscess, 10/05/14. Anemia  Hgb - 8.5 - 10/06/2014 Hx of hypertension  Hypokalemia Diarrhea Antibiotics:  Day 2 Zosyn and Vancomycin DVT:  Adding SCD, and Lovenox per pharmacy for DVT prophylaxis  Plan:  Fluid draining does not look like it is related to GI tract, so I don't think this is a leak.  I will give him a fluid bolus just to get his BP up more.  Continue antibiotics and I would keep him in SDU for now.  Replace K+, keep on full liquids for now.  Consider adding something more for his diarrhea, but will discuss with Medicine and Dr. Lucia Gaskins.  Adding SCD and Lovenox for DVT   LOS: 1 day   JENNINGS,[name] 10/06/2014  Agree with above. Medical oncology - B. Sherrill. Drain - 2,100 cc recorded  yesterday, only 100 cc recorded today.  He feels better overall.  Despite large about of infected fluid in his abdomen, he had very little abdominal symptoms.  Alphonsa Overall, MD, [facility] Surgery Pager: [phone] Office phone:  [phone]

## 2014-10-06 NOTE — Progress Notes (Signed)
[facility]  Patient Status: Active (receiving services up to time of hospitalization)  [facility] is providing the following services: PT  If patient discharges after hours, please call [phone].   Lurlean Leyden 10/06/2014, 10:46 AM

## 2014-10-06 NOTE — Progress Notes (Signed)
ANTICOAGULATION CONSULT NOTE - Initial Consult  Pharmacy Consult for enoxaparin Indication: VTE Prophylaxis  No Known Allergies  Patient Measurements: Height: 6\' 1"  (185.4 cm) Weight: 170 lb 3.1 oz (77.2 kg) IBW/kg (Calculated) : 79.9   Vital Signs: Temp: 98 F (36.7 C) (06/14 0800) Temp Source: Oral (06/14 0800) BP: 100/71 mmHg (06/14 0830) Pulse Rate: 85 (06/14 0830)  Labs:  Recent Labs  10/05/14 0837 10/05/14 0837 10/05/14 1350 10/05/14 1942 10/06/14 0545  HGB 8.8*  --  8.6*  9.9*  --  8.5*  HCT 27.2*  --  26.3*  29.0*  --  26.2*  PLT 378  --  434*  --  383  APTT  --   --   --  42*  --   LABPROT  --   --   --  17.9*  --   INR  --   --   --  1.47  --   CREATININE  --  0.6* 0.50*  --  0.43*    Estimated Creatinine Clearance: 97.8 mL/min (by C-G formula based on Cr of 0.43).   Medical History: Past Medical History  Diagnosis Date  . Tremor     takes Primidone 250 once daily and inderall 160 daily for this-has had it since he was 21  . Pneumonia 06/2012  . Macular degeneration   . Benign essential tremor   . Coronary artery disease   . Anemia 06/30/2014  . 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 Essential hypertension 08/18/2014    Assessment: 69 y.o. Male with h/o adenocarcinoma carcinomatosis, CAD anemia, hypertension, recently discharged from the hospital presents with fever, sob and generalized weakness.  Pharmacy consulted to dose enoxaparin for VTE prophylaxis.  Scr 0.43, CrCl ~62mls/min BMI <30  Goal of Therapy:  Monitor platelets by anticoagulation protocol Enoxaparin per guidelines  Plan:  Enoxaparin 40mg  SQ q24h Follow renal function Pharmacy will follow peripherally    Dolly Rias RPh 10/06/2014, 9:31 AM Pager [phone]

## 2014-10-06 NOTE — Progress Notes (Signed)
Initial Nutrition Assessment  DOCUMENTATION CODES:  Severe malnutrition in context of chronic illness  INTERVENTION: Boost Plus BID- each supplements provides 340 kcal and 14 g protein  Snacks BID  Multivitamin with minerals daily  RD will continue to monitor  NUTRITION DIAGNOSIS:  Malnutrition related to chronic illness as evidenced by severe depletion of body fat, severe depletion of muscle mass.  GOAL:  Patient will meet greater than or equal to 90% of their needs  MONITOR:  PO intake, Supplement acceptance, Labs, Weight trends  REASON FOR ASSESSMENT:  Malnutrition Screening Tool   ASSESSMENT: 69 y.o. male with h/o adenocarcinoma carcinomatosis, CAD, anemia, hypertension, recently discharged from the hospital presents today with fever, sob and generalized weakness.   - Pt reports poor po and poor appetite prior to admission.  - Usual body weight is 180 lbs. 10 lb wt loss in the past 5 weeks.  - Recently started Megace. Hopefully will improve appetite.  - Wife reports that pt was having skin breakdown which has since resolved.  - Discussed importance of incorporating protein into every meal.  - Pt also having issues recently with reflux. Advised to remain upright after meals.  - Labs and medications reviewed  Na 127  K 3.1  Mg 1.4  Albumin 1.2  Height:  Ht Readings from Last 1 Encounters:  10/05/14 6\' 1"  (1.854 m)    Weight:  Wt Readings from Last 1 Encounters:  10/05/14 170 lb 3.1 oz (77.2 kg)    Ideal Body Weight:  83.6 kg  Wt Readings from Last 10 Encounters:  10/05/14 170 lb 3.1 oz (77.2 kg)  10/05/14 173 lb 6.4 oz (78.654 kg)  09/23/14 170 lb 9.6 oz (77.384 kg)  09/06/14 170 lb 14.4 oz (77.52 kg)  08/25/14 179 lb 11.2 oz (81.511 kg)  08/18/14 173 lb (78.472 kg)  08/11/14 181 lb 12.8 oz (82.464 kg)  07/28/14 181 lb 12.8 oz (82.464 kg)  07/08/14 183 lb 11.2 oz (83.326 kg)  07/02/14 191 lb 2.2 oz (86.7 kg)    BMI:  Body mass index is 22.46  kg/(m^2).  Estimated Nutritional Needs:  Kcal:  2100-2300  Protein:  115-130 g  Fluid:  2.1-2.3 L/day  Skin:  Reviewed, no issues  Diet Order:  Diet full liquid Room service appropriate?: Yes; Fluid consistency:: Thin  EDUCATION NEEDS:  Education needs addressed   Intake/Output Summary (Last 24 hours) at 10/06/14 1238 Last data filed at 10/06/14 1158  Gross per 24 hour  Intake   4455 ml  Output   3260 ml  Net   1195 ml    Last BM:  6/14  Laurette Schimke [address], [place], [place]

## 2014-10-06 NOTE — Progress Notes (Signed)
Patient ID: Wesley Dillon, male   DOB: [birth year], 69 y.o.   MRN: [id number]   SUBJECTIVE: Feels better this morning, no longer weak.  BP soft but not lightheaded.  No chest pain or dyspnea.   Scheduled Meds: . megestrol  200 mg Oral BID  . midazolam  1 mg Intravenous Once  . piperacillin-tazobactam (ZOSYN)  IV  3.375 g Intravenous 3 times per day  . saccharomyces boulardii  250 mg Oral BID  . vancomycin  1,000 mg Intravenous Q12H   Continuous Infusions: . sodium chloride 75 mL/hr at 10/06/14 0213   PRN Meds:.sodium chloride    Filed Vitals:   10/06/14 0530 10/06/14 0600 10/06/14 0630 10/06/14 0700  BP: 77/49 73/45 78/46  89/52  Pulse: 87 84 83 85  Temp:      TempSrc:      Resp: 20 19 16 18   Height:      Weight:      SpO2: 97% 99% 98% 97%    Intake/Output Summary (Last 24 hours) at 10/06/14 0720 Last data filed at 10/06/14 0600  Gross per 24 hour  Intake 4303.75 ml  Output   3260 ml  Net 1043.75 ml    LABS: Basic Metabolic Panel:  Recent Labs  10/05/14 0837 10/05/14 1350 10/06/14 0545  NA 127* 123* 127*  K 3.4* 3.6 3.1*  CL  --  94* 98*  CO2 20*  --  21*  GLUCOSE 97 95 116*  BUN 7.3 7 9   CREATININE 0.6* 0.50* 0.43*  CALCIUM 7.6*  --  6.9*   Liver Function Tests:  Recent Labs  10/05/14 0837 10/06/14 0545  AST 20 18  ALT 17 16*  ALKPHOS 165* 114  BILITOT 0.72 0.5  PROT 6.0* 5.1*  ALBUMIN 1.3* 1.2*   No results for input(s): LIPASE, AMYLASE in the last 72 hours. CBC:  Recent Labs  10/05/14 0837 10/05/14 1350 10/06/14 0545  WBC 20.3* 20.5* 15.1*  NEUTROABS 18.2* 19.1*  --   HGB 8.8* 8.6*  9.9* 8.5*  HCT 27.2* 26.3*  29.0* 26.2*  MCV 82.4 83.8 82.9  PLT 378 434* 383   Cardiac Enzymes: No results for input(s): CKTOTAL, CKMB, CKMBINDEX, TROPONINI in the last 72 hours. BNP: Invalid input(s): POCBNP D-Dimer:  Recent Labs  10/05/14 1627  DDIMER 9.76*   Hemoglobin A1C: No results for input(s): HGBA1C in the last 72  hours. Fasting Lipid Panel: No results for input(s): CHOL, HDL, LDLCALC, TRIG, CHOLHDL, LDLDIRECT in the last 72 hours. Thyroid Function Tests: No results for input(s): TSH, T4TOTAL, T3FREE, THYROIDAB in the last 72 hours.  Invalid input(s): FREET3 Anemia Panel: No results for input(s): VITAMINB12, FOLATE, FERRITIN, TIBC, IRON, RETICCTPCT in the last 72 hours.  RADIOLOGY: Dg Chest 2 View  10/05/2014   CLINICAL DATA:  Fever and shortness of breath beginning today. History of colon carcinoma.  EXAM: CHEST  2 VIEW  COMPARISON:  Single view of the chest 08/18/2014.  FINDINGS: Port-A-Cath is again seen. There are small to moderate bilateral pleural effusions, greater on the right. Basilar airspace disease is also worse on the right. No pneumothorax is identified. Heart size is normal.  IMPRESSION: Right greater than left small to moderate bilateral pleural effusions with associated basilar airspace disease, likely atelectasis.   Electronically Signed   By: Inge Rise M.D.   On: 10/05/2014 14:28   Ct Angio Chest Pe W/cm &/or Wo Cm  10/05/2014   CLINICAL DATA:  Acute onset of generalized abdominal pain and weakness. Hypotension.  Diarrhea. Hypoxia. Significantly elevated D-dimer. Current history of adenocarcinoma with carcinomatosis, status post chemotherapy. Initial encounter.  EXAM: CT ANGIOGRAPHY CHEST  CT ABDOMEN AND PELVIS WITH CONTRAST  TECHNIQUE: Multidetector CT imaging of the chest was performed using the standard protocol during bolus administration of intravenous contrast. Multiplanar CT image reconstructions and MIPs were obtained to evaluate the vascular anatomy. Multidetector CT imaging of the abdomen and pelvis was performed using the standard protocol during bolus administration of intravenous contrast.  CONTRAST:  182mL OMNIPAQUE IOHEXOL 350 MG/ML SOLN  COMPARISON:  Abdominal radiographs performed earlier today at 2:12 p.m., and CT of the abdomen and pelvis performed 09/08/2014. CT of  the chest performed 03/16/2014  FINDINGS: CTA CHEST FINDINGS  There is no evidence of pulmonary embolus.  Moderate bilateral pleural effusions are seen, with underlying partial consolidation of both lower lung lobes. The expanded portions of both lungs appear relatively clear. There is no evidence of pneumothorax. No masses are identified; no abnormal focal contrast enhancement is seen.  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 The great vessels are grossly unremarkable in appearance. No pericardial effusion is identified. No definite mediastinal lymphadenopathy is seen.  Scattered coronary artery calcifications are noted. There is suggestion of mild diffuse wall thickening along the esophagus, with associated edema, raising concern for some degree of esophagitis. No axillary lymphadenopathy is seen. The thyroid gland is unremarkable in appearance. A right-sided chest port is noted ending about the distal SVC.  No acute osseous abnormalities are seen.  CT ABDOMEN and PELVIS FINDINGS  There appears to be a very large peripherally enhancing evolving abscess at the right side of the abdomen, partially contiguous with additional collections of fluid throughout the remainder of the abdomen and pelvis. This measures approximately 30.9 x 12.2 x 20.0 cm, extending from the level of the liver down into the pelvis. This contains a large amount of fluid and air. Given that the surgery was a month ago, this is unlikely to reflect residual air. There is no evidence of extravasation of contrast into the collection to suggest a perforated viscus, though it cannot be excluded; contrast appears to extend normally through the visualized bowel anastomoses and into the sigmoid colon. A gas-producing organism is thought to be the most likely etiology of the air.  Underlying moderate to large volume ascites is noted within the abdomen and pelvis;  the underlying ascites has lower attenuation than the fluid within the abscess.  There appears to be prominence of intrahepatic biliary ducts and underlying periportal edema, though the common bile duct is not definitely enlarged. This is of uncertain significance. 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 The spleen is unremarkable in appearance. The superior aspect of the abscess demonstrates some degree of mass effect on the inferior aspect of the liver. A likely small stone is noted along the lateral gallbladder wall. The gallbladder is otherwise grossly unremarkable. The pancreas and adrenal glands are unremarkable.  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 The stomach is grossly unremarkable in appearance, though difficult to fully assess at the level of the hepatic hilum.  A 2.2 cm cyst is noted at the upper pole of the left kidney. The kidneys are otherwise unremarkable. There is no evidence of hydronephrosis. No renal or ureteral stones are seen. No perinephric stranding is appreciated.  There is vague diffuse persistent or recurrent edema involving multiple small bowel loops at  the mid and lower abdomen. There is also diffuse nodular mucosal thickening along the remaining ileum extending to the level of the patient's ileocolic anastomosis, and more mild mucosal edema along the distal descending and sigmoid colon. Some of these bowel loops are largely surrounded by the abscess. This may reflect lymphatic congestion, ischemia or infection. Underlying tumor infiltration cannot be excluded, as previously noted. The edematous bowel loops still demonstrate minimal enhancement, without evidence for dead bowel at this time.  No acute vascular abnormalities are seen. Mild scattered calcification is noted along the abdominal aorta and its branches.  The bladder is mildly distended and grossly unremarkable in appearance. The prostate is enlarged, measuring 5.5 cm in transverse dimension. No  inguinal lymphadenopathy is seen.  No acute osseous abnormalities are identified.  Review of the MIP images confirms the above findings.  IMPRESSION: 1. Very large peripherally enhancing evolving abscess at the right side of the abdomen, partially contiguous with additional collections of fluid throughout the remainder of the abdomen and pelvis. This measures 30.9 x 12.2 x 20.0 cm, extending from the level of the liver down into the pelvis, and contains a large amount of fluid and air. No evidence of extravasation of contrast into the collection to suggest a perforated viscus, though it cannot be excluded. Contrast appears to extend normally through the visualized bowel anastomoses. A gas-producing organism is thought to be the most likely etiology of the air. Given the size of the abscess and surrounding ascites, this is likely too large for successful interventional drainage. Surgical consultation is suggested, as deemed clinically appropriate. 2. Underlying moderate to large volume ascites within the abdomen and pelvis. 3. Vague persistent or recurrent diffuse edema involving multiple small bowel loops at the mid and lower abdomen. Diffuse nodular mucosal thickening along the remaining ileum extending to the ileocolic anastomosis, and more mild mucosal edema along the distal descending and sigmoid colon. This may reflect lymphatic congestion, ischemia or infection. As previously noted, underlying tumor infiltration cannot be excluded. The edematous bowel loops still demonstrate minimal enhancement, without definite dead bowel at this time. 4. No evidence of pulmonary embolus. 5. Moderate bilateral pleural effusions, with underlying partial consolidation of both lower lung lobes. 6. Suggestion of mild diffuse wall thickening along the esophagus, with associated edema, raising concern for some degree of esophagitis. 7. Prominence of the intrahepatic biliary ducts and underlying periportal edema, somewhat more  prominent than on the prior study, though the common bile duct is not definitely enlarged. This is of uncertain significance, though it could reflect some degree of obstruction at the hepatic hilum. Underlying cavernous transformation of the portal vein noted. 8. 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 9. 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 If and when deemed clinically appropriate, would recommend semi-annual imaging followup by CTA or MRA, with elective referral to cardiothoracic surgery if not already obtained. This recommendation follows 2010 ACCF/AHA/AATS/ACR/ASA/SCA/SCAI/SIR/STS/SVM Guidelines for the Diagnosis and Management of Patients With Thoracic Aortic Disease. Circulation. 2010; 121: F818-E993 10. Scattered coronary artery calcifications seen. 11. Cholelithiasis; gallbladder otherwise unremarkable. 12. Small left renal cyst noted. 13. Mild scattered calcification along the abdominal aorta and its branches. 14. Enlarged prostate noted.  These results were called by telephone at the time of interpretation on 10/05/2014 at 6:50 pm to Dr. Hosie Poisson, who verbally acknowledged these results.   Electronically Signed   By: Garald Balding M.D.   On: 10/05/2014 19:11   Ct Abdomen Pelvis W  Contrast  10/05/2014   CLINICAL DATA:  Acute onset of generalized abdominal pain and weakness. Hypotension. Diarrhea. Hypoxia. Significantly elevated D-dimer. Current history of adenocarcinoma with carcinomatosis, status post chemotherapy. Initial encounter.  EXAM: CT ANGIOGRAPHY CHEST  CT ABDOMEN AND PELVIS WITH CONTRAST  TECHNIQUE: Multidetector CT imaging of the chest was performed using the standard protocol during bolus administration of intravenous contrast. Multiplanar CT image reconstructions and MIPs were obtained to evaluate the vascular anatomy. Multidetector CT imaging of the abdomen and pelvis was performed using the standard protocol during bolus administration  of intravenous contrast.  CONTRAST:  15mL OMNIPAQUE IOHEXOL 350 MG/ML SOLN  COMPARISON:  Abdominal radiographs performed earlier today at 2:12 p.m., and CT of the abdomen and pelvis performed 09/08/2014. CT of the chest performed 03/16/2014  FINDINGS: CTA CHEST FINDINGS  There is no evidence of pulmonary embolus.  Moderate bilateral pleural effusions are seen, with underlying partial consolidation of both lower lung lobes. The expanded portions of both lungs appear relatively clear. There is no evidence of pneumothorax. No masses are identified; no abnormal focal contrast enhancement is seen.  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 The great vessels are grossly unremarkable in appearance. No pericardial effusion is identified. No definite mediastinal lymphadenopathy is seen.  Scattered coronary artery calcifications are noted. There is suggestion of mild diffuse wall thickening along the esophagus, with associated edema, raising concern for some degree of esophagitis. No axillary lymphadenopathy is seen. The thyroid gland is unremarkable in appearance. A right-sided chest port is noted ending about the distal SVC.  No acute osseous abnormalities are seen.  CT ABDOMEN and PELVIS FINDINGS  There appears to be a very large peripherally enhancing evolving abscess at the right side of the abdomen, partially contiguous with additional collections of fluid throughout the remainder of the abdomen and pelvis. This measures approximately 30.9 x 12.2 x 20.0 cm, extending from the level of the liver down into the pelvis. This contains a large amount of fluid and air. Given that the surgery was a month ago, this is unlikely to reflect residual air. There is no evidence of extravasation of contrast into the collection to suggest a perforated viscus, though it cannot be excluded; contrast appears to extend normally through the visualized  bowel anastomoses and into the sigmoid colon. A gas-producing organism is thought to be the most likely etiology of the air.  Underlying moderate to large volume ascites is noted within the abdomen and pelvis; the underlying ascites has lower attenuation than the fluid within the abscess.  There appears to be prominence of intrahepatic biliary ducts and underlying periportal edema, though the common bile duct is not definitely enlarged. This is of uncertain significance. 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 The spleen is unremarkable in appearance. The superior aspect of the abscess demonstrates some degree of mass effect on the inferior aspect of the liver. A likely small stone is noted along the lateral gallbladder wall. The gallbladder is otherwise grossly unremarkable. The pancreas and adrenal glands are unremarkable.  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 The stomach is grossly unremarkable in appearance, though difficult to fully assess at the level of the hepatic hilum.  A 2.2 cm cyst is noted at the upper pole of the left kidney. The kidneys are otherwise unremarkable. There is no evidence of hydronephrosis. No renal or ureteral stones are seen. No perinephric stranding  is appreciated.  There is vague diffuse persistent or recurrent edema involving multiple small bowel loops at the mid and lower abdomen. There is also diffuse nodular mucosal thickening along the remaining ileum extending to the level of the patient's ileocolic anastomosis, and more mild mucosal edema along the distal descending and sigmoid colon. Some of these bowel loops are largely surrounded by the abscess. This may reflect lymphatic congestion, ischemia or infection. Underlying tumor infiltration cannot be excluded, as previously noted. The edematous bowel loops still demonstrate minimal enhancement, without evidence for dead bowel at this time.  No acute vascular abnormalities are seen. Mild scattered  calcification is noted along the abdominal aorta and its branches.  The bladder is mildly distended and grossly unremarkable in appearance. The prostate is enlarged, measuring 5.5 cm in transverse dimension. No inguinal lymphadenopathy is seen.  No acute osseous abnormalities are identified.  Review of the MIP images confirms the above findings.  IMPRESSION: 1. Very large peripherally enhancing evolving abscess at the right side of the abdomen, partially contiguous with additional collections of fluid throughout the remainder of the abdomen and pelvis. This measures 30.9 x 12.2 x 20.0 cm, extending from the level of the liver down into the pelvis, and contains a large amount of fluid and air. No evidence of extravasation of contrast into the collection to suggest a perforated viscus, though it cannot be excluded. Contrast appears to extend normally through the visualized bowel anastomoses. A gas-producing organism is thought to be the most likely etiology of the air. Given the size of the abscess and surrounding ascites, this is likely too large for successful interventional drainage. Surgical consultation is suggested, as deemed clinically appropriate. 2. Underlying moderate to large volume ascites within the abdomen and pelvis. 3. Vague persistent or recurrent diffuse edema involving multiple small bowel loops at the mid and lower abdomen. Diffuse nodular mucosal thickening along the remaining ileum extending to the ileocolic anastomosis, and more mild mucosal edema along the distal descending and sigmoid colon. This may reflect lymphatic congestion, ischemia or infection. As previously noted, underlying tumor infiltration cannot be excluded. The edematous bowel loops still demonstrate minimal enhancement, without definite dead bowel at this time. 4. No evidence of pulmonary embolus. 5. Moderate bilateral pleural effusions, with underlying partial consolidation of both lower lung lobes. 6. Suggestion of mild  diffuse wall thickening along the esophagus, with associated edema, raising concern for some degree of esophagitis. 7. Prominence of the intrahepatic biliary ducts and underlying periportal edema, somewhat more prominent than on the prior study, though the common bile duct is not definitely enlarged. This is of uncertain significance, though it could reflect some degree of obstruction at the hepatic hilum. Underlying cavernous transformation of the portal vein noted. 8. 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 9. 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 If and when deemed clinically appropriate, would recommend semi-annual imaging followup by CTA or MRA, with elective referral to cardiothoracic surgery if not already obtained. This recommendation follows 2010 ACCF/AHA/AATS/ACR/ASA/SCA/SCAI/SIR/STS/SVM Guidelines for the Diagnosis and Management of Patients With Thoracic Aortic Disease. Circulation. 2010; 121: [id number] 10. Scattered coronary artery calcifications seen. 11. Cholelithiasis; gallbladder otherwise unremarkable. 12. Small left renal cyst noted. 13. Mild scattered calcification along the abdominal aorta and its branches. 14. Enlarged prostate noted.  These results were called by telephone at the time of interpretation on 10/05/2014 at 6:50 pm to Dr. Hosie Poisson, who verbally acknowledged these results.   Electronically Signed  By: Garald Balding M.D.   On: 10/05/2014 19:11   Ct Abdomen Pelvis W Contrast  09/08/2014   ADDENDUM REPORT: 09/08/2014 13:17  ADDENDUM: The transition point is in the anterior aspect of the left mid abdomen where the small bowel wall thickening begins, best seen on sagittal image number 95 and coronal image number 25. Another differential consideration is tumor involving this portion of the small bowel, including the possibility of a primary small bowel tumor at the location of initial concentric narrowing.  These results were called  by telephone at the time of interpretation on 09/08/2014 at 1:17 pm to Dr. Excell Seltzer , who verbally acknowledged these results.   Electronically Signed   By: Claudie Revering M.D.   On: 09/08/2014 13:17   09/08/2014   CLINICAL DATA:  Pattern on small bowel obstruction on radiographs earlier today. Abdominal distention. History of peritoneal adenocarcinomatosis with unknown primary. Undergoing chemotherapy.  EXAM: CT ABDOMEN AND PELVIS WITH CONTRAST  TECHNIQUE: Multidetector CT imaging of the abdomen and pelvis was performed using the standard protocol following bolus administration of intravenous contrast.  CONTRAST:  127mL OMNIPAQUE IOHEXOL 300 MG/ML  SOLN  COMPARISON:  Radiographs obtained yesterday. Abdomen and pelvis CT dated 06/30/2014.  FINDINGS: Interval small bilateral pleural effusions. There is also bilateral lower lobe, lingular and right middle lobe atelectasis.  Multiple dilated, contrast filled proximal small bowel loops as well as less dilated, fluid-filled mid small bowel loops. Normal caliber distal small bowel and colon. The duodenum is not dilated. There is swirling of the mesenteric vessels in the central abdomen, best seen on the sagittal reconstructed images (sagittal image number 69). There are also some small bowel loops in the left mid abdomen which are minimally dilated and demonstrate diffuse, low-density wall thickening, not previously present. No areas of vascular occlusion are seen.  Moderate amount of free peritoneal fluid with a significant increase. There is some loculated fluid lateral to the rectosigmoid conjunction on the left, not previously present. There is a suggestion of a small amount of vague soft tissue density associated with this fluid collection, best seen on coronal image number 84. The fluid collection measures 5.1 x 3.2 cm on image number 92. The previously demonstrated peritoneal nodularity is not currently visualized. No enlarged lymph nodes.  And upper pole  left renal cyst is unchanged. The liver, spleen, pancreas, adrenal glands, right kidney and urinary bladder unremarkable. Mildly enlarged prostate gland. Small bilateral inguinal hernias containing fat. Nasogastric tube tip in the proximal stomach. Mildly dilated gallbladder containing several tiny gallstones that appear adherent to the gallbladder wall in the fundus. No gallbladder wall thickening. Lumbar and lower thoracic spine degenerative changes. No evidence of bony metastatic disease.  IMPRESSION: 1. Partial small bowel obstruction, most likely due to an internal hernia. 2. Moderate diffuse low density wall thickening involving small bowel loops in the left mid abdomen. This could be due to ischemia or lymphatic congestion associated with the small bowel obstruction. This could also be infectious or inflammatory in nature. 3. Moderate amount of ascites, increased. 4. Interval 5.1 x 3.2 cm loculated fluid collection lateral to the rectosigmoid junction on the left with subtle soft tissue components, suspicious for a peritoneal implant of metastatic disease. 5. Otherwise, improved peritoneal metastatic disease. 6. Interval small bilateral pleural effusions and bibasilar atelectasis. 7. Small bilateral inguinal hernias containing fat and previously noted small hiatal hernia. 8. Mildly enlarged prostate gland. These results will be called to the ordering clinician or representative by  the Radiologist Assistant, and communication documented in the PACS or zVision Dashboard.  Electronically Signed: By: Claudie Revering M.D. On: 09/08/2014 13:03   Dg Abd 2 Views  10/05/2014   CLINICAL DATA:  Fever. Shortness of breath. Colostomy revision and bowel resection may 2016.  EXAM: ABDOMEN - 2 VIEW  COMPARISON:  09/08/2014  FINDINGS: Airspace opacities probably from pleural effusions of the lung bases.  Nondependent collection of gas in the right abdomen with air-fluid level on the left-side-down lateral decubitus views ;  this does not track around the inferior liver edge, and I do not see regular lines on the frontal projection, accordingly this may be gas within right-sided bowel, but I am not absolutely certain.  IMPRESSION: 1. Bibasilar pleural effusions with associated atelectasis/airspace opacity. 2. Air- fluid level non-dependently in the abdomen on the lateral decubitus view. The gas does not appear to track along the liver edge and accordingly is probably within dilated bowel, but I am not 378% certain that this is intraluminal. CT abdomen recommended. These results will be called to the ordering clinician or representative by the Radiologist Assistant, and communication documented in the PACS or zVision Dashboard.   Electronically Signed   By: Van Clines M.D.   On: 10/05/2014 14:34   Dg Abd 2 Views  09/07/2014   CLINICAL DATA:  69 year old male with a history of abdominal distention. History of adenocarcinoma, with peritoneal disease.  EXAM: ABDOMEN - 2 VIEW  COMPARISON:  09/06/2014, 08/24/2014  FINDINGS: Similar pattern of multiple distended small bowel loops with a paucity of colonic gas.  Upright image demonstrates multiple air-fluid levels within small bowel.  Interval placement of gastric tube, which appears to terminate in the left upper quadrant in the region of the stomach.  No evidence of free air on this series.  No displaced fracture.  IMPRESSION: Persistence of dilated small bowel loops with air-fluid levels and paucity of colonic gas compatible with small bowel obstruction. Recommend serial x-ray follow-up of this abnormal gas pattern, or if there is concern for further evaluation, CT abdomen.  Interval placement of gastric tube, which appears to terminate the stomach.  Signed,  Dulcy Fanny. Earleen Newport, DO  [facility]  [facility]   Electronically Signed   By: Corrie Mckusick D.O.   On: 09/07/2014 08:25   Dg Abd 2 Views  09/06/2014   CLINICAL DATA:  Abdominal  distention and pain.  EXAM: ABDOMEN - 2 VIEW  COMPARISON:  Aug 24, 2014.  FINDINGS: Severe small bowel dilatation with air-fluid levels is noted concerning for distal small bowel obstruction. There is no evidence of pneumoperitoneum. Phleboliths are noted in the pelvis.  IMPRESSION: Severely dilated small bowel loops with air-fluid levels are noted concerning for distal small bowel obstruction.   Electronically Signed   By: Marijo Conception, M.D.   On: 09/06/2014 10:07    PHYSICAL EXAM General: NAD Neck: No JVD, no thyromegaly or thyroid nodule.  Lungs: Decreased breath sounds at bases bilaterally.  CV: Nondisplaced PMI.  Heart regular S1/S2, no S3/S4, no murmur.  No peripheral edema.   Abdomen: Soft, mildly distended, nontender, has percutaneous drain present.   Neurologic: Alert and oriented x 3.  Psych: Normal affect. Extremities: No clubbing or cyanosis.   TELEMETRY: Reviewed telemetry pt in NSR  ASSESSMENT AND PLAN: 70 yo with history of abdominal carcinomatosis, MAC with bronchiectasis, and ascending aortic aneurysm presented with weakness and dizziness.  Cardiology was called due to troponin of 0.87.  Patient was found to have large abdominal abscess on CT and is now s/p percutaneous drainage.  1. ID: Patient's presentation appears to be due to sepsis in the setting of a large abdominal abscess, now on antibiotics and s/p percutaneous drainage.  SBP soft in the 80s-90s but no lightheadedness.  Right now, he seems to be doing ok on IV fluid.  2. Elevated troponin: I suspect that this was due to demand ischemia in the setting of sepsis with mild hypotension.  No further workup at this time.  No chest pain or dyspnea. No PE on CTA chest.  3. Pleural effusions/ascites: I suspect that this is due to his abdominal carcinomatosis.   4. Ascending aortic aneurysm: Stable at 4.8 cm on CT.   We will follow from a distance, call with questions.   Loralie Champagne 10/06/2014 7:26 AM

## 2014-10-07 DIAGNOSIS — I712 Thoracic aortic aneurysm, without rupture: Secondary | ICD-10-CM

## 2014-10-07 DIAGNOSIS — L03311 Cellulitis of abdominal wall: Secondary | ICD-10-CM

## 2014-10-07 DIAGNOSIS — T814XXD Infection following a procedure, subsequent encounter: Secondary | ICD-10-CM

## 2014-10-07 LAB — CBC
HCT: 25.8 % — ABNORMAL LOW (ref 39.0–52.0)
HEMOGLOBIN: 8.2 g/dL — AB (ref 13.0–17.0)
MCH: 26.1 pg (ref 26.0–34.0)
MCHC: 31.8 g/dL (ref 30.0–36.0)
MCV: 82.2 fL (ref 78.0–100.0)
Platelets: 419 10*3/uL — ABNORMAL HIGH (ref 150–400)
RBC: 3.14 MIL/uL — ABNORMAL LOW (ref 4.22–5.81)
RDW: 17.6 % — AB (ref 11.5–15.5)
WBC: 14.6 10*3/uL — ABNORMAL HIGH (ref 4.0–10.5)

## 2014-10-07 LAB — BASIC METABOLIC PANEL
Anion gap: 5 (ref 5–15)
BUN: 8 mg/dL (ref 6–20)
CHLORIDE: 103 mmol/L (ref 101–111)
CO2: 20 mmol/L — AB (ref 22–32)
CREATININE: 0.54 mg/dL — AB (ref 0.61–1.24)
Calcium: 6.7 mg/dL — ABNORMAL LOW (ref 8.9–10.3)
GFR calc Af Amer: >60 mL/min (ref >60)
GFR calc non Af Amer: >60 mL/min (ref >60)
GLUCOSE: 97 mg/dL (ref 65–99)
Potassium: 3.9 mmol/L (ref 3.5–5.1)
Sodium: 128 mmol/L — ABNORMAL LOW (ref 135–145)

## 2014-10-07 LAB — VANCOMYCIN, TROUGH: VANCOMYCIN TR: 13 ug/mL (ref 10.0–20.0)

## 2014-10-07 LAB — MAGNESIUM: Magnesium: 1.8 mg/dL (ref 1.7–2.4)

## 2014-10-07 MED ORDER — VANCOMYCIN HCL 10 G IV SOLR
1250.0000 mg | Freq: Two times a day (BID) | INTRAVENOUS | Status: DC
  Administered 2014-10-08 – 2014-10-09 (×3): 1250 mg via INTRAVENOUS
  Filled 2014-10-07 (×5): qty 1250

## 2014-10-07 MED ORDER — SUCRALFATE 1 GM/10ML PO SUSP
1.0000 g | Freq: Three times a day (TID) | ORAL | Status: DC
  Administered 2014-10-07 – 2014-10-10 (×12): 1 g via ORAL
  Filled 2014-10-07 (×15): qty 10

## 2014-10-07 MED ORDER — FAMOTIDINE 20 MG PO TABS
20.0000 mg | ORAL_TABLET | Freq: Two times a day (BID) | ORAL | Status: DC
  Administered 2014-10-07 – 2014-10-10 (×7): 20 mg via ORAL
  Filled 2014-10-07 (×7): qty 1

## 2014-10-07 MED ORDER — ENSURE ENLIVE PO LIQD
237.0000 mL | Freq: Two times a day (BID) | ORAL | Status: DC
  Administered 2014-10-09: 237 mL via ORAL

## 2014-10-07 MED ORDER — GI COCKTAIL ~~LOC~~
30.0000 mL | ORAL | Status: DC | PRN
  Administered 2014-10-07 – 2014-10-09 (×7): 30 mL via ORAL
  Filled 2014-10-07 (×7): qty 30

## 2014-10-07 NOTE — Progress Notes (Addendum)
Brief Pharmacy Progress Note:   See full details from note by Dolly Rias, RPh at 10:45AM. In brief, this is a 26 y/oM being treated for severe sepsis from abdominal abscess, questionable HCAP, abdominal wall cellulitis with Vancomycin and Zosyn.    Vancomycin trough level at 1545 = 13 mcg/mL, slightly subtherapeutic  SCr WNL, stable with CrCl > 100 ml/min CG  Afebrile  Plan: --After 1600 dose given, will increase Vancomycin to 1250mg  IV q12h. --Plan for Vancomycin trough level at new steady state, as clinically indicated. --Continue Zosyn 3.375g IV q8h (infuse over 4 hours). --Continue to monitor renal function, cultures, clinical course.   Lindell Spar, PharmD, BCPS Pager: [phone] 10/07/2014 4:33 PM

## 2014-10-07 NOTE — Progress Notes (Signed)
ANTIBIOTIC CONSULT NOTE - follow up  Pharmacy Consult for Vancomycin / Zosyn Indication: HCAP  No Known Allergies  Patient Measurements: Height: 6\' 1"  (185.4 cm) Weight: 179 lb 3.7 oz (81.3 kg) IBW/kg (Calculated) : 79.9 Adjusted Body Weight:   Vital Signs: Temp: 99.1 F (37.3 C) (06/15 0400) Temp Source: Oral (06/15 0400) BP: 114/70 mmHg (06/15 0700) Pulse Rate: 94 (06/15 0700) Intake/Output from previous day: 06/14 0701 - 06/15 0700 In: 2170 [P.O.:360; I.V.:1450; IV Piggyback:350] Out: 1500 [Urine:1225; Drains:275] Intake/Output from this shift:    Labs:  Recent Labs  10/05/14 1350 10/06/14 0545 10/07/14 0537  WBC 20.5* 15.1* 14.6*  HGB 8.6*  9.9* 8.5* 8.2*  PLT 434* 383 419*  CREATININE 0.50* 0.43* 0.54*   Estimated Creatinine Clearance: 101.3 mL/min (by C-G formula based on Cr of 0.54). No results for input(s): VANCOTROUGH, VANCOPEAK, VANCORANDOM, GENTTROUGH, GENTPEAK, GENTRANDOM, TOBRATROUGH, TOBRAPEAK, TOBRARND, AMIKACINPEAK, AMIKACINTROU, AMIKACIN in the last 72 hours.   Microbiology: Recent Results (from the past 720 hour(s))  Surgical pcr screen     Status: None   Collection Time: 09/08/14 10:02 PM  Result Value Ref Range Status   MRSA, PCR NEGATIVE NEGATIVE Final   Staphylococcus aureus NEGATIVE NEGATIVE Final    Comment:        The Xpert SA Assay (FDA approved for NASAL specimens in patients over 43 years of age), is one component of a comprehensive surveillance program.  Test performance has been validated by [facility] for patients greater than or equal to 58 year old. It is not intended to diagnose infection nor to guide or monitor treatment.   Clostridium Difficile by PCR     Status: None   Collection Time: 09/15/14  3:03 AM  Result Value Ref Range Status   C difficile by pcr NEGATIVE NEGATIVE Final  Stool culture     Status: None   Collection Time: 09/17/14 10:18 AM  Result Value Ref Range Status   Specimen Description STOOL  Final    Special Requests NONE  Final   Culture   Final    NO SALMONELLA, SHIGELLA, CAMPYLOBACTER, YERSINIA, OR E.COLI 0157:H7 ISOLATED Performed at [facility]    Report Status 09/21/2014 FINAL  Final  Clostridium Difficile by PCR     Status: None   Collection Time: 09/17/14 10:18 AM  Result Value Ref Range Status   C difficile by pcr NEGATIVE NEGATIVE Final  Blood culture (routine x 2)     Status: None (Preliminary result)   Collection Time: 10/05/14  1:53 PM  Result Value Ref Range Status   Specimen Description BLOOD LEFT ANTECUBITAL  Final   Special Requests BOTTLES DRAWN AEROBIC AND ANAEROBIC 5CC EACH  Final   Culture   Final           BLOOD CULTURE RECEIVED NO GROWTH TO DATE CULTURE WILL BE HELD FOR 5 DAYS BEFORE ISSUING A FINAL NEGATIVE REPORT Performed at [facility]    Report Status PENDING  Incomplete  Blood culture (routine x 2)     Status: None (Preliminary result)   Collection Time: 10/05/14  1:55 PM  Result Value Ref Range Status   Specimen Description BLOOD RIGHT ANTECUBITAL  Final   Special Requests BOTTLES DRAWN AEROBIC AND ANAEROBIC 5CC EACH  Final   Culture   Final           BLOOD CULTURE RECEIVED NO GROWTH TO DATE CULTURE WILL BE HELD FOR 5 DAYS BEFORE ISSUING A FINAL NEGATIVE REPORT Performed at  [facility]    Report Status PENDING  Incomplete  Clostridium Difficile by PCR (not at [facility])     Status: None   Collection Time: 10/05/14  7:45 PM  Result Value Ref Range Status   C difficile by pcr NEGATIVE NEGATIVE Final  Culture, routine-abscess     Status: None (Preliminary result)   Collection Time: 10/05/14  9:57 PM  Result Value Ref Range Status   Specimen Description DRAINAGE  Final   Special Requests Normal  Final   Gram Stain   Final    FEW WBC PRESENT, PREDOMINANTLY PMN NO SQUAMOUS EPITHELIAL CELLS SEEN ABUNDANT GRAM NEGATIVE RODS ABUNDANT GRAM POSITIVE COCCI IN PAIRS FEW GRAM POSITIVE RODS    Culture   Final    Culture  reincubated for better growth Performed at [facility]    Report Status PENDING  Incomplete    Medical History: Past Medical History  Diagnosis Date  . Tremor     takes Primidone 250 once daily and inderall 160 daily for this-has had it since he was 21  . Pneumonia 06/2012  . Macular degeneration   . Benign essential tremor   . Coronary artery disease   . Anemia 06/30/2014  . 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 Essential hypertension 08/18/2014   Assessment: 69 yoM with history of metastatic carcinoma with abdominal carcinomatosis and recent hospitalization for SBO s/p resection, colectomy, and small bowel bypass.  Pt seen earlier today at [facility] and started on doxycycline for for possible abdominal wall cellulitis.  Now presents to ED with SOB and fever.  Pharmacy consulted to start broad spectrum antibiotics for HCAP. First doses given in ED.   WBC elevated.  SCr 0.54, CrCl >100(N).  Lactic acid 2.13. Tm24h: 99.1  6/13 >> Doxy x 1 >> 6/13 6/13 >> Vanc  >> 6/13 >> Zosyn  >>    Cultures: 6/13 Blood x2: NGTD 6/13 CDiff: NEG  Goal of Therapy:  Vancomycin trough level 15-20 mcg/ml  Eradication of infection  Plan:  Obtain vanc trough @ 1300 Continue Zosyn 3.375g IV q8h (infuse over 4 hours) Continue Vancomycin 1g IV q12h and adjust per trough F/u renal fxn, cultures, clinical course  Dolly Rias RPh 10/07/2014, 10:47 AM Pager [phone]

## 2014-10-07 NOTE — Progress Notes (Addendum)
[facility] PROGRESS NOTE  Wesley Dillon WUJ:[id number] DOB: 1945-07-02 DOA: 10/05/2014 PCP: Gerrit Heck, MD Interim summary:  Wesley Dillon is a 68 y.o. male with h/o adenocarcinoma carcinomatosis, CAD, anemia, hypertension, recently discharged from the hospital who presented with sudden onset fever, sob and generalized weakness.  He was found to have a large abdominal abscess and possible HCAP and severe sepsis.  He was admitted to step down and surgery consulted.  He did not require basal pressors. He was started on IV vancomycin and Zosyn and underwent interventional radiology percutaneous drain placement on 6/13. He was mildly hypotensive but his blood pressures improved with IV fluids and antibiotics and have remained stable.  Assessment/Plan:  Severe sepsis from abdominal abscess/ questionable HCAP/ Abdominal Wall cellulitis, resolving.  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 Excell Seltzer in May 2016 -  Management per Dr Learta Codding.   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   Ascending aortic aneurysm, 4.8 cm on CT chest. Recommend follow-up with CT surgery as an outpatient depending on prognosis and treatment of his underlying malignancy. Currently the patient is too ill and infected to undergo surgical repair.  Asymmetric lower extremity edema -  Vascular duplex to exclude DVT in the setting of malignancy  GERD, symptomatic -  Increase frequency of GI cocktail.  Add famotidine to PPI.   DIET:  Regular ACCESS:  Port IVF:  Yes PROPH:  lovenox and SCDs  Code Status: full code Family Communication:  Patient and wife Disposition Plan:  Transfer to telemetry   Consultants:  Cardiology  General Surgery  Interventional Radiology  Procedures: -  IR percutaneous drain placement for retroperitoneal abscess, 10/05/14 -  CT angio chest abd/pelvis on 6/13  Antibiotics: vancomycin 6/13 >> zosyn 6/13 >>  HPI/Subjective:  Continuing to feel better.   denies lightheadedness, chills, fevers, shortness of breath.  He states the redness has been improving on his belly and his abdominal pain has improved.  Heartburn is severe.  Objective: Filed Vitals:   10/07/14 1200  BP: 114/63  Pulse: 98  Temp: 97.5 F (36.4 C)  Resp: 23    Intake/Output Summary (Last 24 hours) at 10/07/14 1257 Last data filed at 10/07/14 0745  Gross per 24 hour  Intake 2258.75 ml  Output   1500 ml  Net 758.75 ml   Filed Weights   10/05/14 1845 10/07/14 0400  Weight: 77.2 kg (170 lb 3.1 oz) 81.3 kg (179 lb 3.7 oz)    Exam:   General:  Adult  male, lying in bed, no acute distress, cachectic   Cardiovascular:  regular rate and rhythm, normal s1s2, no murmurs, rubs, gallops   Respiratory: diminished at bases, no wheezing, rales, or rhonchi  Abdomen:   NABS, healed craniocaudal midline incision in the lower abdomen with splotchy areas of persistent pink erythema in the superior areas and a more confluent area of erythema at the  base of the incision tracking laterally in both directions. His abdomen feels warm to the touch and firm at the base, right and left lower quadrants, without rebound or guarding. His drain is draining light green somewhat serious mixed with. Material.  Musculoskeletal:  Decreased muscle bulk and tone, 2+ left lower extremity edema and 1+ right lower extremity edema which the patient only states is stable.    Data Reviewed: Basic Metabolic Panel:  Recent Labs Lab 10/05/14 0837 10/05/14 1350 10/06/14 0545 10/07/14 0537  NA 127* 123* 127* 128*  K 3.4* 3.6 3.1* 3.9  CL  --  94* 98* 103  CO2 20*  --  21* 20*  GLUCOSE 97 95 116* 97  BUN 7.3 7 9 8   CREATININE 0.6* 0.50* 0.43* 0.54*  CALCIUM 7.6*  --  6.9* 6.7*  MG  --   --  1.4* 1.8   Liver Function Tests:  Recent Labs Lab 10/05/14 0837 10/06/14 0545  AST 20 18  ALT 17 16*  ALKPHOS 165* 114  BILITOT 0.72 0.5  PROT 6.0* 5.1*  ALBUMIN 1.3* 1.2*   No results for input(s): LIPASE, AMYLASE in the last 168 hours. No results for input(s): AMMONIA in the last 168 hours. CBC:  Recent Labs Lab 10/05/14 0837 10/05/14 1350 10/06/14 0545 10/07/14 0537  WBC 20.3* 20.5* 15.1* 14.6*  NEUTROABS 18.2* 19.1*  --   --   HGB 8.8* 8.6*  9.9* 8.5* 8.2*  HCT 27.2* 26.3*  29.0* 26.2* 25.8*  MCV 82.4 83.8 82.9 82.2  PLT 378 434* 383 419*   Cardiac Enzymes: No results for input(s): CKTOTAL, CKMB, CKMBINDEX, TROPONINI in the last 168 hours. BNP (last 3 results)  Recent Labs  10/06/14 0545  BNP 574.2*    ProBNP (last 3 results) No results for input(s): PROBNP in the last 8760 hours.  CBG: No results for input(s): GLUCAP in the last 168 hours.  Recent Results (from the past 240 hour(s))  Blood culture (routine x 2)     Status: None (Preliminary result)   Collection Time: 10/05/14  1:53 PM  Result Value Ref Range Status   Specimen Description BLOOD LEFT ANTECUBITAL  Final   Special Requests BOTTLES DRAWN AEROBIC AND ANAEROBIC 5CC  EACH  Final   Culture   Final           BLOOD CULTURE RECEIVED NO GROWTH TO DATE CULTURE WILL BE HELD FOR 5 DAYS BEFORE ISSUING A FINAL NEGATIVE REPORT Performed at [facility]    Report Status PENDING  Incomplete  Blood culture (routine x 2)     Status: None (Preliminary result)   Collection Time: 10/05/14  1:55 PM  Result Value Ref Range Status   Specimen Description BLOOD RIGHT ANTECUBITAL  Final   Special Requests BOTTLES DRAWN AEROBIC AND ANAEROBIC 5CC EACH  Final   Culture   Final           BLOOD CULTURE RECEIVED NO GROWTH TO DATE CULTURE WILL BE HELD FOR 5 DAYS BEFORE ISSUING A FINAL NEGATIVE REPORT Performed at [facility]    Report Status PENDING  Incomplete  Clostridium Difficile by PCR (not at [facility])     Status: None   Collection Time: 10/05/14  7:45 PM  Result Value Ref Range Status   C difficile by pcr NEGATIVE NEGATIVE Final  Culture, routine-abscess     Status: None (Preliminary result)   Collection Time: 10/05/14  9:57 PM  Result Value Ref Range Status   Specimen Description DRAINAGE  Final   Special Requests Normal  Final   Gram Stain   Final    FEW WBC PRESENT, PREDOMINANTLY PMN NO SQUAMOUS EPITHELIAL CELLS SEEN ABUNDANT GRAM NEGATIVE RODS ABUNDANT GRAM POSITIVE COCCI IN PAIRS FEW GRAM POSITIVE RODS    Culture   Final    Culture reincubated for better growth Performed at [facility]    Report Status PENDING  Incomplete     Studies: Dg Chest 2 View  10/05/2014   CLINICAL DATA:  Fever and shortness of breath beginning today. History of colon carcinoma.  EXAM: CHEST  2 VIEW  COMPARISON:  Single view of the chest 08/18/2014.  FINDINGS: Port-A-Cath is again seen. There are small to moderate bilateral pleural effusions, greater on the right. Basilar airspace disease is also worse on the right. No pneumothorax is identified. Heart size is normal.  IMPRESSION: Right greater than left small to moderate bilateral pleural effusions with  associated basilar airspace disease, likely atelectasis.   Electronically Signed   By: Inge Rise M.D.   On: 10/05/2014 14:28   Ct Angio Chest Pe W/cm &/or Wo Cm  10/05/2014   CLINICAL DATA:  Acute onset of generalized abdominal pain and weakness. Hypotension. Diarrhea. Hypoxia. Significantly elevated D-dimer. Current history of adenocarcinoma with carcinomatosis, status post chemotherapy. Initial encounter.  EXAM: CT ANGIOGRAPHY CHEST  CT ABDOMEN AND PELVIS WITH CONTRAST  TECHNIQUE: Multidetector CT imaging of the chest was performed using the standard protocol during bolus administration of intravenous contrast. Multiplanar CT image reconstructions and MIPs were obtained to evaluate the vascular anatomy. Multidetector CT imaging of the abdomen and pelvis was performed using the standard protocol during bolus administration of intravenous contrast.  CONTRAST:  120mL OMNIPAQUE IOHEXOL 350 MG/ML SOLN  COMPARISON:  Abdominal radiographs performed earlier today at 2:12 p.m., and CT of the abdomen and pelvis performed 09/08/2014. CT of the chest performed 03/16/2014  FINDINGS: CTA CHEST FINDINGS  There is no evidence of pulmonary embolus.  Moderate bilateral pleural effusions are seen, with underlying partial consolidation of both lower lung lobes. The expanded portions of both lungs appear relatively clear. There is no evidence of pneumothorax. No masses are identified; no abnormal focal contrast enhancement is seen.  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 The great vessels are grossly unremarkable in appearance. No pericardial effusion is identified. No definite mediastinal lymphadenopathy is seen.  Scattered coronary artery calcifications are noted. There is suggestion of mild diffuse wall thickening along the esophagus, with associated edema, raising concern for some degree of esophagitis. No axillary  lymphadenopathy is seen. The thyroid gland is unremarkable in appearance. A right-sided chest port is noted ending about the distal SVC.  No acute osseous abnormalities are seen.  CT ABDOMEN and PELVIS FINDINGS  There appears to be a very large peripherally enhancing evolving abscess at the right side of the abdomen, partially contiguous with additional collections of fluid throughout the remainder of the abdomen and pelvis. This measures approximately 30.9 x 12.2 x 20.0 cm,  extending from the level of the liver down into the pelvis. This contains a large amount of fluid and air. Given that the surgery was a month ago, this is unlikely to reflect residual air. There is no evidence of extravasation of contrast into the collection to suggest a perforated viscus, though it cannot be excluded; contrast appears to extend normally through the visualized bowel anastomoses and into the sigmoid colon. A gas-producing organism is thought to be the most likely etiology of the air.  Underlying moderate to large volume ascites is noted within the abdomen and pelvis; the underlying ascites has lower attenuation than the fluid within the abscess.  There appears to be prominence of intrahepatic biliary ducts and underlying periportal edema, though the common bile duct is not definitely enlarged. This is of uncertain significance. 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 The spleen is unremarkable in appearance. The superior aspect of the abscess demonstrates some degree of mass effect on the inferior aspect of the liver. A likely small stone is noted along the lateral gallbladder wall. The gallbladder is otherwise grossly unremarkable. The pancreas and adrenal glands are unremarkable.  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 The stomach is grossly unremarkable in appearance, though difficult to fully assess at the level of the hepatic hilum.  A 2.2 cm cyst is noted at the upper pole of the left kidney. The  kidneys are otherwise unremarkable. There is no evidence of hydronephrosis. No renal or ureteral stones are seen. No perinephric stranding is appreciated.  There is vague diffuse persistent or recurrent edema involving multiple small bowel loops at the mid and lower abdomen. There is also diffuse nodular mucosal thickening along the remaining ileum extending to the level of the patient's ileocolic anastomosis, and more mild mucosal edema along the distal descending and sigmoid colon. Some of these bowel loops are largely surrounded by the abscess. This may reflect lymphatic congestion, ischemia or infection. Underlying tumor infiltration cannot be excluded, as previously noted. The edematous bowel loops still demonstrate minimal enhancement, without evidence for dead bowel at this time.  No acute vascular abnormalities are seen. Mild scattered calcification is noted along the abdominal aorta and its branches.  The bladder is mildly distended and grossly unremarkable in appearance. The prostate is enlarged, measuring 5.5 cm in transverse dimension. No inguinal lymphadenopathy is seen.  No acute osseous abnormalities are identified.  Review of the MIP images confirms the above findings.  IMPRESSION: 1. Very large peripherally enhancing evolving abscess at the right side of the abdomen, partially contiguous with additional collections of fluid throughout the remainder of the abdomen and pelvis. This measures 30.9 x 12.2 x 20.0 cm, extending from the level of the liver down into the pelvis, and contains a large amount of fluid and air. No evidence of extravasation of contrast into the collection to suggest a perforated viscus, though it cannot be excluded. Contrast appears to extend normally through the visualized bowel anastomoses. A gas-producing organism is thought to be the most likely etiology of the air. Given the size of the abscess and surrounding ascites, this is likely too large for successful interventional  drainage. Surgical consultation is suggested, as deemed clinically appropriate. 2. Underlying moderate to large volume ascites within the abdomen and pelvis. 3. Vague persistent or recurrent diffuse edema involving multiple small bowel loops at the mid and lower abdomen. Diffuse nodular mucosal thickening along the remaining ileum extending to the ileocolic anastomosis, and more mild mucosal edema along the  distal descending and sigmoid colon. This may reflect lymphatic congestion, ischemia or infection. As previously noted, underlying tumor infiltration cannot be excluded. The edematous bowel loops still demonstrate minimal enhancement, without definite dead bowel at this time. 4. No evidence of pulmonary embolus. 5. Moderate bilateral pleural effusions, with underlying partial consolidation of both lower lung lobes. 6. Suggestion of mild diffuse wall thickening along the esophagus, with associated edema, raising concern for some degree of esophagitis. 7. Prominence of the intrahepatic biliary ducts and underlying periportal edema, somewhat more prominent than on the prior study, though the common bile duct is not definitely enlarged. This is of uncertain significance, though it could reflect some degree of obstruction at the hepatic hilum. Underlying cavernous transformation of the portal vein noted. 8. 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 9. 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 If and when deemed clinically appropriate, would recommend semi-annual imaging followup by CTA or MRA, with elective referral to cardiothoracic surgery if not already obtained. This recommendation follows 2010 ACCF/AHA/AATS/ACR/ASA/SCA/SCAI/SIR/STS/SVM Guidelines for the Diagnosis and Management of Patients With Thoracic Aortic Disease. Circulation. 2010; 121: T245-Y099 10. Scattered coronary artery calcifications seen. 11. Cholelithiasis; gallbladder otherwise unremarkable. 12. Small  left renal cyst noted. 13. Mild scattered calcification along the abdominal aorta and its branches. 14. Enlarged prostate noted.  These results were called by telephone at the time of interpretation on 10/05/2014 at 6:50 pm to Dr. Hosie Poisson, who verbally acknowledged these results.   Electronically Signed   By: Garald Balding M.D.   On: 10/05/2014 19:11   Ct Abdomen Pelvis W Contrast  10/05/2014   CLINICAL DATA:  Acute onset of generalized abdominal pain and weakness. Hypotension. Diarrhea. Hypoxia. Significantly elevated D-dimer. Current history of adenocarcinoma with carcinomatosis, status post chemotherapy. Initial encounter.  EXAM: CT ANGIOGRAPHY CHEST  CT ABDOMEN AND PELVIS WITH CONTRAST  TECHNIQUE: Multidetector CT imaging of the chest was performed using the standard protocol during bolus administration of intravenous contrast. Multiplanar CT image reconstructions and MIPs were obtained to evaluate the vascular anatomy. Multidetector CT imaging of the abdomen and pelvis was performed using the standard protocol during bolus administration of intravenous contrast.  CONTRAST:  154mL OMNIPAQUE IOHEXOL 350 MG/ML SOLN  COMPARISON:  Abdominal radiographs performed earlier today at 2:12 p.m., and CT of the abdomen and pelvis performed 09/08/2014. CT of the chest performed 03/16/2014  FINDINGS: CTA CHEST FINDINGS  There is no evidence of pulmonary embolus.  Moderate bilateral pleural effusions are seen, with underlying partial consolidation of both lower lung lobes. The expanded portions of both lungs appear relatively clear. There is no evidence of pneumothorax. No masses are identified; no abnormal focal contrast enhancement is seen.  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 The great vessels are grossly unremarkable in appearance. No pericardial effusion is identified. No definite mediastinal lymphadenopathy is seen.   Scattered coronary artery calcifications are noted. There is suggestion of mild diffuse wall thickening along the esophagus, with associated edema, raising concern for some degree of esophagitis. No axillary lymphadenopathy is seen. The thyroid gland is unremarkable in appearance. A right-sided chest port is noted ending about the distal SVC.  No acute osseous abnormalities are seen.  CT ABDOMEN and PELVIS FINDINGS  There appears to be a very large peripherally enhancing evolving abscess at the right side of the abdomen, partially contiguous with additional collections of fluid  throughout the remainder of the abdomen and pelvis. This measures approximately 30.9 x 12.2 x 20.0 cm, extending from the level of the liver down into the pelvis. This contains a large amount of fluid and air. Given that the surgery was a month ago, this is unlikely to reflect residual air. There is no evidence of extravasation of contrast into the collection to suggest a perforated viscus, though it cannot be excluded; contrast appears to extend normally through the visualized bowel anastomoses and into the sigmoid colon. A gas-producing organism is thought to be the most likely etiology of the air.  Underlying moderate to large volume ascites is noted within the abdomen and pelvis; the underlying ascites has lower attenuation than the fluid within the abscess.  There appears to be prominence of intrahepatic biliary ducts and underlying periportal edema, though the common bile duct is not definitely enlarged. This is of uncertain significance. 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 The spleen is unremarkable in appearance. The superior aspect of the abscess demonstrates some degree of mass effect on the inferior aspect of the liver. A likely small stone is noted along the lateral gallbladder wall. The gallbladder is otherwise grossly unremarkable. The pancreas and adrenal glands are unremarkable.  Esophageal and  splenic varices are noted. The stomach is grossly unremarkable in appearance, though difficult to fully assess at the level of the hepatic hilum.  A 2.2 cm cyst is noted at the upper pole of the left kidney. The kidneys are otherwise unremarkable. There is no evidence of hydronephrosis. No renal or ureteral stones are seen. No perinephric stranding is appreciated.  There is vague diffuse persistent or recurrent edema involving multiple small bowel loops at the mid and lower abdomen. There is also diffuse nodular mucosal thickening along the remaining ileum extending to the level of the patient's ileocolic anastomosis, and more mild mucosal edema along the distal descending and sigmoid colon. Some of these bowel loops are largely surrounded by the abscess. This may reflect lymphatic congestion, ischemia or infection. Underlying tumor infiltration cannot be excluded, as previously noted. The edematous bowel loops still demonstrate minimal enhancement, without evidence for dead bowel at this time.  No acute vascular abnormalities are seen. Mild scattered calcification is noted along the abdominal aorta and its branches.  The bladder is mildly distended and grossly unremarkable in appearance. The prostate is enlarged, measuring 5.5 cm in transverse dimension. No inguinal lymphadenopathy is seen.  No acute osseous abnormalities are identified.  Review of the MIP images confirms the above findings.  IMPRESSION: 1. Very large peripherally enhancing evolving abscess at the right side of the abdomen, partially contiguous with additional collections of fluid throughout the remainder of the abdomen and pelvis. This measures 30.9 x 12.2 x 20.0 cm, extending from the level of the liver down into the pelvis, and contains a large amount of fluid and air. No evidence of extravasation of contrast into the collection to suggest a perforated viscus, though it cannot be excluded. Contrast appears to extend normally through the  visualized bowel anastomoses. A gas-producing organism is thought to be the most likely etiology of the air. Given the size of the abscess and surrounding ascites, this is likely too large for successful interventional drainage. Surgical consultation is suggested, as deemed clinically appropriate. 2. Underlying moderate to large volume ascites within the abdomen and pelvis. 3. Vague persistent or recurrent diffuse edema involving multiple small bowel loops at the mid and lower abdomen. Diffuse nodular mucosal  thickening along the remaining ileum extending to the ileocolic anastomosis, and more mild mucosal edema along the distal descending and sigmoid colon. This may reflect lymphatic congestion, ischemia or infection. As previously noted, underlying tumor infiltration cannot be excluded. The edematous bowel loops still demonstrate minimal enhancement, without definite dead bowel at this time. 4. No evidence of pulmonary embolus. 5. Moderate bilateral pleural effusions, with underlying partial consolidation of both lower lung lobes. 6. Suggestion of mild diffuse wall thickening along the esophagus, with associated edema, raising concern for some degree of esophagitis. 7. Prominence of the intrahepatic biliary ducts and underlying periportal edema, somewhat more prominent than on the prior study, though the common bile duct is not definitely enlarged. This is of uncertain significance, though it could reflect some degree of obstruction at the hepatic hilum. Underlying cavernous transformation of the portal vein noted. 8. 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 9. 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 If and when deemed clinically appropriate, would recommend semi-annual imaging followup by CTA or MRA, with elective referral to cardiothoracic surgery if not already obtained. This recommendation follows 2010 ACCF/AHA/AATS/ACR/ASA/SCA/SCAI/SIR/STS/SVM Guidelines for the  Diagnosis and Management of Patients With Thoracic Aortic Disease. Circulation. 2010; 121: H474-Q595 10. Scattered coronary artery calcifications seen. 11. Cholelithiasis; gallbladder otherwise unremarkable. 12. Small left renal cyst noted. 13. Mild scattered calcification along the abdominal aorta and its branches. 14. Enlarged prostate noted.  These results were called by telephone at the time of interpretation on 10/05/2014 at 6:50 pm to Dr. Hosie Poisson, who verbally acknowledged these results.   Electronically Signed   By: Garald Balding M.D.   On: 10/05/2014 19:11   Dg Abd 2 Views  10/05/2014   CLINICAL DATA:  Fever. Shortness of breath. Colostomy revision and bowel resection may 2016.  EXAM: ABDOMEN - 2 VIEW  COMPARISON:  09/08/2014  FINDINGS: Airspace opacities probably from pleural effusions of the lung bases.  Nondependent collection of gas in the right abdomen with air-fluid level on the left-side-down lateral decubitus views ; this does not track around the inferior liver edge, and I do not see regular lines on the frontal projection, accordingly this may be gas within right-sided bowel, but I am not absolutely certain.  IMPRESSION: 1. Bibasilar pleural effusions with associated atelectasis/airspace opacity. 2. Air- fluid level non-dependently in the abdomen on the lateral decubitus view. The gas does not appear to track along the liver edge and accordingly is probably within dilated bowel, but I am not 638% certain that this is intraluminal. CT abdomen recommended. These results will be called to the ordering clinician or representative by the Radiologist Assistant, and communication documented in the PACS or zVision Dashboard.   Electronically Signed   By: Van Clines M.D.   On: 10/05/2014 14:34   Ct Image Guided Drainage Percut Cath  Peritoneal Retroperit  10/06/2014   CLINICAL DATA:  Abdominal carcinomatosis post laparotomy for bowel obstruction. Now presents with large intra-abdominal  abscess.  EXAM: CT GUIDED DRAINAGE OF PERITONEAL ABSCESS  ANESTHESIA/SEDATION: Intravenous Versed were administered as conscious sedation during continuous cardiorespiratory monitoring by the ICU RN, with a total moderate sedation time of less than 30 minutes.  PROCEDURE: The procedure, risks, benefits, and alternatives were explained to the patient. Questions regarding the procedure were encouraged and answered. The patient understands and consents to the procedure.  Select axial scans through the abdomen were obtained. The collection was localized and an appropriate skin entry site was determined  and marked.  The operative field was prepped with Betadinein a sterile fashion, and a sterile drape was applied covering the operative field. A sterile gown and sterile gloves were used for the procedure. Local anesthesia was provided with 1% Lidocaine.  Under CT fluoroscopic guidance, a 19 gauge percutaneous entry needle was advanced into the collection. Purulent material returned. An Amplatz guidewire advanced easily into the collection. Tract dilated to allow placement of a 12 French pigtail catheter, placed within the central aspect of the collection. Catheter position confirmed on CT. Catheter secured externally with 0 Prolene suture and StatLock and placed to gravity bag. 1700 mL of fluid were removed during the procedure. A sample sent for Gram stain, culture and sensitivity.  COMPLICATIONS: None immediate  FINDINGS: Limited CT confirms large gas and fluid loculated collection in the right peritoneal cavity. 12 French abscess drain catheter placed under CT guidance.  IMPRESSION: 1. Technically successful CT-guided peritoneal abscess drain catheter placement.   Electronically Signed   By: Lucrezia Europe M.D.   On: 10/06/2014 09:20    Scheduled Meds: . antiseptic oral rinse  7 mL Mouth Rinse q12n4p  . chlorhexidine  15 mL Mouth Rinse BID  . enoxaparin (LOVENOX) injection  40 mg Subcutaneous Q24H  . famotidine  20  mg Oral BID  . lactose free nutrition  237 mL Oral BID BM  . megestrol  200 mg Oral BID  . midazolam  1 mg Intravenous Once  . multivitamin with minerals  1 tablet Oral Daily  . pantoprazole  40 mg Oral BID  . piperacillin-tazobactam (ZOSYN)  IV  3.375 g Intravenous 3 times per day  . potassium chloride  20 mEq Oral TID PC  . saccharomyces boulardii  250 mg Oral BID  . sucralfate  1 g Oral TID AC & HS  . vancomycin  1,000 mg Intravenous Q12H   Continuous Infusions: . 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

## 2014-10-07 NOTE — Progress Notes (Signed)
Subjective: He is up in the chair and feels better.  Biggest complaint is Reflux.  Drain is a little more serous purulent fluid as opposed to greenish white purulent fluid coming out yesterday.  His abdomen is still distended and hard, he is doing fine with full liquids and wants to eat real food now.  Still some cellulitis lower abdomen.  Objective: Vital signs in last 24 hours: Temp:  [97.4 F (36.3 C)-99.1 F (37.3 C)] 99.1 F (37.3 C) (06/15 0400) Pulse Rate:  [82-101] 94 (06/15 0700) Resp:  [12-27] 23 (06/15 0700) BP: (81-118)/(46-79) 114/70 mmHg (06/15 0700) SpO2:  [91 %-98 %] 95 % (06/15 0700) Weight:  [81.3 kg (179 lb 3.7 oz)] 81.3 kg (179 lb 3.7 oz) (06/15 0400) Last BM Date: 10/07/14 360 Po  Full liquids 1225 urine 275 drain (2160 ml yesterday after drain placed) Afebrile, VSS NA 128,  K+ up to 3.9, mag up to 1.8 WBC still up Intake/Output from previous day: 06/14 0701 - 06/15 0700 In: 2170 [P.O.:360; I.V.:1450; IV Piggyback:350] Out: 1500 [Urine:1225; Drains:275] Intake/Output this shift:    General appearance: alert, cooperative and no distress GI: distended and sore, few rare BS, some cellulitis lower abdomen, but he and wife both think it is much improved.    Lab Results:   Recent Labs  10/06/14 0545 10/07/14 0537  WBC 15.1* 14.6*  HGB 8.5* 8.2*  HCT 26.2* 25.8*  PLT 383 419*    BMET  Recent Labs  10/06/14 0545 10/07/14 0537  NA 127* 128*  K 3.1* 3.9  CL 98* 103  CO2 21* 20*  GLUCOSE 116* 97  BUN 9 8  CREATININE 0.43* 0.54*  CALCIUM 6.9* 6.7*   PT/INR  Recent Labs  10/05/14 1942  LABPROT 17.9*  INR 1.47     Recent Labs Lab 10/05/14 0837 10/06/14 0545  AST 20 18  ALT 17 16*  ALKPHOS 165* 114  BILITOT 0.72 0.5  PROT 6.0* 5.1*  ALBUMIN 1.3* 1.2*     Lipase     Component Value Date/Time   LIPASE 32 09/06/2014 0932     Studies/Results: Dg Chest 2 View  10/05/2014   CLINICAL DATA:  Fever and shortness of breath  beginning today. History of colon carcinoma.  EXAM: CHEST  2 VIEW  COMPARISON:  Single view of the chest 08/18/2014.  FINDINGS: Port-A-Cath is again seen. There are small to moderate bilateral pleural effusions, greater on the right. Basilar airspace disease is also worse on the right. No pneumothorax is identified. Heart size is normal.  IMPRESSION: Right greater than left small to moderate bilateral pleural effusions with associated basilar airspace disease, likely atelectasis.   Electronically Signed   By: Inge Rise M.D.   On: 10/05/2014 14:28   Ct Angio Chest Pe W/cm &/or Wo Cm  10/05/2014   CLINICAL DATA:  Acute onset of generalized abdominal pain and weakness. Hypotension. Diarrhea. Hypoxia. Significantly elevated D-dimer. Current history of adenocarcinoma with carcinomatosis, status post chemotherapy. Initial encounter.  EXAM: CT ANGIOGRAPHY CHEST  CT ABDOMEN AND PELVIS WITH CONTRAST  TECHNIQUE: Multidetector CT imaging of the chest was performed using the standard protocol during bolus administration of intravenous contrast. Multiplanar CT image reconstructions and MIPs were obtained to evaluate the vascular anatomy. Multidetector CT imaging of the abdomen and pelvis was performed using the standard protocol during bolus administration of intravenous contrast.  CONTRAST:  116mL OMNIPAQUE IOHEXOL 350 MG/ML SOLN  COMPARISON:  Abdominal radiographs performed earlier today at 2:12 p.m., and  CT of the abdomen and pelvis performed 09/08/2014. CT of the chest performed 03/16/2014  FINDINGS: CTA CHEST FINDINGS  There is no evidence of pulmonary embolus.  Moderate bilateral pleural effusions are seen, with underlying partial consolidation of both lower lung lobes. The expanded portions of both lungs appear relatively clear. There is no evidence of pneumothorax. No masses are identified; no abnormal focal contrast enhancement is seen.  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 The great vessels are grossly unremarkable in appearance. No pericardial effusion is identified. No definite mediastinal lymphadenopathy is seen.  Scattered coronary artery calcifications are noted. There is suggestion of mild diffuse wall thickening along the esophagus, with associated edema, raising concern for some degree of esophagitis. No axillary lymphadenopathy is seen. The thyroid gland is unremarkable in appearance. A right-sided chest port is noted ending about the distal SVC.  No acute osseous abnormalities are seen.  CT ABDOMEN and PELVIS FINDINGS  There appears to be a very large peripherally enhancing evolving abscess at the right side of the abdomen, partially contiguous with additional collections of fluid throughout the remainder of the abdomen and pelvis. This measures approximately 30.9 x 12.2 x 20.0 cm, extending from the level of the liver down into the pelvis. This contains a large amount of fluid and air. Given that the surgery was a month ago, this is unlikely to reflect residual air. There is no evidence of extravasation of contrast into the collection to suggest a perforated viscus, though it cannot be excluded; contrast appears to extend normally through the visualized bowel anastomoses and into the sigmoid colon. A gas-producing organism is thought to be the most likely etiology of the air.  Underlying moderate to large volume ascites is noted within the abdomen and pelvis; the underlying ascites has lower attenuation than the fluid within the abscess.  There appears to be prominence of intrahepatic biliary ducts and underlying periportal edema, though the common bile duct is not definitely enlarged. This is of uncertain significance. 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 The spleen is unremarkable in appearance. The superior aspect of the abscess demonstrates some degree of mass effect on the inferior  aspect of the liver. A likely small stone is noted along the lateral gallbladder wall. The gallbladder is otherwise grossly unremarkable. The pancreas and adrenal glands are unremarkable.  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 The stomach is grossly unremarkable in appearance, though difficult to fully assess at the level of the hepatic hilum.  A 2.2 cm cyst is noted at the upper pole of the left kidney. The kidneys are otherwise unremarkable. There is no evidence of hydronephrosis. No renal or ureteral stones are seen. No perinephric stranding is appreciated.  There is vague diffuse persistent or recurrent edema involving multiple small bowel loops at the mid and lower abdomen. There is also diffuse nodular mucosal thickening along the remaining ileum extending to the level of the patient's ileocolic anastomosis, and more mild mucosal edema along the distal descending and sigmoid colon. Some of these bowel loops are largely surrounded by the abscess. This may reflect lymphatic congestion, ischemia or infection. Underlying tumor infiltration cannot be excluded, as previously noted. The edematous bowel loops still demonstrate minimal enhancement, without evidence for dead bowel at this time.  No acute vascular abnormalities are seen. Mild scattered calcification is noted along the abdominal aorta and its branches.  The  bladder is mildly distended and grossly unremarkable in appearance. The prostate is enlarged, measuring 5.5 cm in transverse dimension. No inguinal lymphadenopathy is seen.  No acute osseous abnormalities are identified.  Review of the MIP images confirms the above findings.  IMPRESSION: 1. Very large peripherally enhancing evolving abscess at the right side of the abdomen, partially contiguous with additional collections of fluid throughout the remainder of the abdomen and pelvis. This measures 30.9 x 12.2 x 20.0 cm, extending from the level of the liver down into the pelvis, and contains a large  amount of fluid and air. No evidence of extravasation of contrast into the collection to suggest a perforated viscus, though it cannot be excluded. Contrast appears to extend normally through the visualized bowel anastomoses. A gas-producing organism is thought to be the most likely etiology of the air. Given the size of the abscess and surrounding ascites, this is likely too large for successful interventional drainage. Surgical consultation is suggested, as deemed clinically appropriate. 2. Underlying moderate to large volume ascites within the abdomen and pelvis. 3. Vague persistent or recurrent diffuse edema involving multiple small bowel loops at the mid and lower abdomen. Diffuse nodular mucosal thickening along the remaining ileum extending to the ileocolic anastomosis, and more mild mucosal edema along the distal descending and sigmoid colon. This may reflect lymphatic congestion, ischemia or infection. As previously noted, underlying tumor infiltration cannot be excluded. The edematous bowel loops still demonstrate minimal enhancement, without definite dead bowel at this time. 4. No evidence of pulmonary embolus. 5. Moderate bilateral pleural effusions, with underlying partial consolidation of both lower lung lobes. 6. Suggestion of mild diffuse wall thickening along the esophagus, with associated edema, raising concern for some degree of esophagitis. 7. Prominence of the intrahepatic biliary ducts and underlying periportal edema, somewhat more prominent than on the prior study, though the common bile duct is not definitely enlarged. This is of uncertain significance, though it could reflect some degree of obstruction at the hepatic hilum. Underlying cavernous transformation of the portal vein noted. 8. 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 9. 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 If and when deemed clinically appropriate, would recommend semi-annual imaging  followup by CTA or MRA, with elective referral to cardiothoracic surgery if not already obtained. This recommendation follows 2010 ACCF/AHA/AATS/ACR/ASA/SCA/SCAI/SIR/STS/SVM Guidelines for the Diagnosis and Management of Patients With Thoracic Aortic Disease. Circulation. 2010; 121: J191-Y782 10. Scattered coronary artery calcifications seen. 11. Cholelithiasis; gallbladder otherwise unremarkable. 12. Small left renal cyst noted. 13. Mild scattered calcification along the abdominal aorta and its branches. 14. Enlarged prostate noted.  These results were called by telephone at the time of interpretation on 10/05/2014 at 6:50 pm to Dr. Hosie Poisson, who verbally acknowledged these results.   Electronically Signed   By: Garald Balding M.D.   On: 10/05/2014 19:11   Ct Abdomen Pelvis W Contrast  10/05/2014   CLINICAL DATA:  Acute onset of generalized abdominal pain and weakness. Hypotension. Diarrhea. Hypoxia. Significantly elevated D-dimer. Current history of adenocarcinoma with carcinomatosis, status post chemotherapy. Initial encounter.  EXAM: CT ANGIOGRAPHY CHEST  CT ABDOMEN AND PELVIS WITH CONTRAST  TECHNIQUE: Multidetector CT imaging of the chest was performed using the standard protocol during bolus administration of intravenous contrast. Multiplanar CT image reconstructions and MIPs were obtained to evaluate the vascular anatomy. Multidetector CT imaging of the abdomen and pelvis was performed using the standard protocol during bolus administration of intravenous contrast.  CONTRAST:  164mL  OMNIPAQUE IOHEXOL 350 MG/ML SOLN  COMPARISON:  Abdominal radiographs performed earlier today at 2:12 p.m., and CT of the abdomen and pelvis performed 09/08/2014. CT of the chest performed 03/16/2014  FINDINGS: CTA CHEST FINDINGS  There is no evidence of pulmonary embolus.  Moderate bilateral pleural effusions are seen, with underlying partial consolidation of both lower lung lobes. The expanded portions of both lungs appear  relatively clear. There is no evidence of pneumothorax. No masses are identified; no abnormal focal contrast enhancement is seen.  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 The great vessels are grossly unremarkable in appearance. No pericardial effusion is identified. No definite mediastinal lymphadenopathy is seen.  Scattered coronary artery calcifications are noted. There is suggestion of mild diffuse wall thickening along the esophagus, with associated edema, raising concern for some degree of esophagitis. No axillary lymphadenopathy is seen. The thyroid gland is unremarkable in appearance. A right-sided chest port is noted ending about the distal SVC.  No acute osseous abnormalities are seen.  CT ABDOMEN and PELVIS FINDINGS  There appears to be a very large peripherally enhancing evolving abscess at the right side of the abdomen, partially contiguous with additional collections of fluid throughout the remainder of the abdomen and pelvis. This measures approximately 30.9 x 12.2 x 20.0 cm, extending from the level of the liver down into the pelvis. This contains a large amount of fluid and air. Given that the surgery was a month ago, this is unlikely to reflect residual air. There is no evidence of extravasation of contrast into the collection to suggest a perforated viscus, though it cannot be excluded; contrast appears to extend normally through the visualized bowel anastomoses and into the sigmoid colon. A gas-producing organism is thought to be the most likely etiology of the air.  Underlying moderate to large volume ascites is noted within the abdomen and pelvis; the underlying ascites has lower attenuation than the fluid within the abscess.  There appears to be prominence of intrahepatic biliary ducts and underlying periportal edema, though the common bile duct is not definitely enlarged. This is of uncertain  significance. 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 The spleen is unremarkable in appearance. The superior aspect of the abscess demonstrates some degree of mass effect on the inferior aspect of the liver. A likely small stone is noted along the lateral gallbladder wall. The gallbladder is otherwise grossly unremarkable. The pancreas and adrenal glands are unremarkable.  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 The stomach is grossly unremarkable in appearance, though difficult to fully assess at the level of the hepatic hilum.  A 2.2 cm cyst is noted at the upper pole of the left kidney. The kidneys are otherwise unremarkable. There is no evidence of hydronephrosis. No renal or ureteral stones are seen. No perinephric stranding is appreciated.  There is vague diffuse persistent or recurrent edema involving multiple small bowel loops at the mid and lower abdomen. There is also diffuse nodular mucosal thickening along the remaining ileum extending to the level of the patient's ileocolic anastomosis, and more mild mucosal edema along the distal descending and sigmoid colon. Some of these bowel loops are largely surrounded by the abscess. This may reflect lymphatic congestion, ischemia or infection. Underlying tumor infiltration cannot be excluded, as previously noted. The edematous bowel loops still demonstrate minimal enhancement, without evidence for dead bowel at this time.  No acute vascular  abnormalities are seen. Mild scattered calcification is noted along the abdominal aorta and its branches.  The bladder is mildly distended and grossly unremarkable in appearance. The prostate is enlarged, measuring 5.5 cm in transverse dimension. No inguinal lymphadenopathy is seen.  No acute osseous abnormalities are identified.  Review of the MIP images confirms the above findings.  IMPRESSION: 1. Very large peripherally enhancing evolving abscess at the right side of the abdomen, partially  contiguous with additional collections of fluid throughout the remainder of the abdomen and pelvis. This measures 30.9 x 12.2 x 20.0 cm, extending from the level of the liver down into the pelvis, and contains a large amount of fluid and air. No evidence of extravasation of contrast into the collection to suggest a perforated viscus, though it cannot be excluded. Contrast appears to extend normally through the visualized bowel anastomoses. A gas-producing organism is thought to be the most likely etiology of the air. Given the size of the abscess and surrounding ascites, this is likely too large for successful interventional drainage. Surgical consultation is suggested, as deemed clinically appropriate. 2. Underlying moderate to large volume ascites within the abdomen and pelvis. 3. Vague persistent or recurrent diffuse edema involving multiple small bowel loops at the mid and lower abdomen. Diffuse nodular mucosal thickening along the remaining ileum extending to the ileocolic anastomosis, and more mild mucosal edema along the distal descending and sigmoid colon. This may reflect lymphatic congestion, ischemia or infection. As previously noted, underlying tumor infiltration cannot be excluded. The edematous bowel loops still demonstrate minimal enhancement, without definite dead bowel at this time. 4. No evidence of pulmonary embolus. 5. Moderate bilateral pleural effusions, with underlying partial consolidation of both lower lung lobes. 6. Suggestion of mild diffuse wall thickening along the esophagus, with associated edema, raising concern for some degree of esophagitis. 7. Prominence of the intrahepatic biliary ducts and underlying periportal edema, somewhat more prominent than on the prior study, though the common bile duct is not definitely enlarged. This is of uncertain significance, though it could reflect some degree of obstruction at the hepatic hilum. Underlying cavernous transformation of the portal vein  noted. 8. 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 9. 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 If and when deemed clinically appropriate, would recommend semi-annual imaging followup by CTA or MRA, with elective referral to cardiothoracic surgery if not already obtained. This recommendation follows 2010 ACCF/AHA/AATS/ACR/ASA/SCA/SCAI/SIR/STS/SVM Guidelines for the Diagnosis and Management of Patients With Thoracic Aortic Disease. Circulation. 2010; 121: [id number] 10. Scattered coronary artery calcifications seen. 11. Cholelithiasis; gallbladder otherwise unremarkable. 12. Small left renal cyst noted. 13. Mild scattered calcification along the abdominal aorta and its branches. 14. Enlarged prostate noted.  These results were called by telephone at the time of interpretation on 10/05/2014 at 6:50 pm to Dr. Hosie Poisson, who verbally acknowledged these results.   Electronically Signed   By: Garald Balding M.D.   On: 10/05/2014 19:11   Dg Abd 2 Views  10/05/2014   CLINICAL DATA:  Fever. Shortness of breath. Colostomy revision and bowel resection may 2016.  EXAM: ABDOMEN - 2 VIEW  COMPARISON:  09/08/2014  FINDINGS: Airspace opacities probably from pleural effusions of the lung bases.  Nondependent collection of gas in the right abdomen with air-fluid level on the left-side-down lateral decubitus views ; this does not track around the inferior liver edge, and I do not see regular lines on the frontal projection, accordingly this may be gas within  right-sided bowel, but I am not absolutely certain.  IMPRESSION: 1. Bibasilar pleural effusions with associated atelectasis/airspace opacity. 2. Air- fluid level non-dependently in the abdomen on the lateral decubitus view. The gas does not appear to track along the liver edge and accordingly is probably within dilated bowel, but I am not 629% certain that this is intraluminal. CT abdomen recommended. These results will be called to  the ordering clinician or representative by the Radiologist Assistant, and communication documented in the PACS or zVision Dashboard.   Electronically Signed   By: Van Clines M.D.   On: 10/05/2014 14:34   Ct Image Guided Drainage Percut Cath  Peritoneal Retroperit  10/06/2014   CLINICAL DATA:  Abdominal carcinomatosis post laparotomy for bowel obstruction. Now presents with large intra-abdominal abscess.  EXAM: CT GUIDED DRAINAGE OF PERITONEAL ABSCESS  ANESTHESIA/SEDATION: Intravenous Versed were administered as conscious sedation during continuous cardiorespiratory monitoring by the ICU RN, with a total moderate sedation time of less than 30 minutes.  PROCEDURE: The procedure, risks, benefits, and alternatives were explained to the patient. Questions regarding the procedure were encouraged and answered. The patient understands and consents to the procedure.  Select axial scans through the abdomen were obtained. The collection was localized and an appropriate skin entry site was determined and marked.  The operative field was prepped with Betadinein a sterile fashion, and a sterile drape was applied covering the operative field. A sterile gown and sterile gloves were used for the procedure. Local anesthesia was provided with 1% Lidocaine.  Under CT fluoroscopic guidance, a 19 gauge percutaneous entry needle was advanced into the collection. Purulent material returned. An Amplatz guidewire advanced easily into the collection. Tract dilated to allow placement of a 12 French pigtail catheter, placed within the central aspect of the collection. Catheter position confirmed on CT. Catheter secured externally with 0 Prolene suture and StatLock and placed to gravity bag. 1700 mL of fluid were removed during the procedure. A sample sent for Gram stain, culture and sensitivity.  COMPLICATIONS: None immediate  FINDINGS: Limited CT confirms large gas and fluid loculated collection in the right peritoneal cavity. 12  French abscess drain catheter placed under CT guidance.  IMPRESSION: 1. Technically successful CT-guided peritoneal abscess drain catheter placement.   Electronically Signed   By: Lucrezia Europe M.D.   On: 10/06/2014 09:20    Medications: . antiseptic oral rinse  7 mL Mouth Rinse q12n4p  . chlorhexidine  15 mL Mouth Rinse BID  . enoxaparin (LOVENOX) injection  40 mg Subcutaneous Q24H  . famotidine  20 mg Oral BID  . lactose free nutrition  237 mL Oral BID BM  . megestrol  200 mg Oral BID  . midazolam  1 mg Intravenous Once  . multivitamin with minerals  1 tablet Oral Daily  . pantoprazole  40 mg Oral BID  . piperacillin-tazobactam (ZOSYN)  IV  3.375 g Intravenous 3 times per day  . potassium chloride  20 mEq Oral TID PC  . saccharomyces boulardii  250 mg Oral BID  . vancomycin  1,000 mg Intravenous Q12H    Assessment/Plan Fever, SOB, generalized weakness,  Abdominal Carcinomatosis with recurrent obstruction S/p exploratory laparotomy, small bowel bypass, and colon resection 09/09/14 Dr. Excell Seltzer  Sepsis from Intraabdominal abscess/ questionable HCAP/ ABDOMINAL WALL cellulitis. : IR percutaneous drain placement for retroperitoneal abscess, 10/05/14. Anemia  Hgb - 8.2 - 10/07/2014 Hx of hypertension Hypokalemia Diarrhea Antibiotics: Day 3 Zosyn and Vancomycin DVT: Adding SCD, and Lovenox per pharmacy for DVT  prophylaxis   Plan:  I am not really going to change anything at this juncture.  He is on PPI bid, I will add some carafate and see if this helps.    LOS: 2 days    JENNINGS,[name] 10/07/2014  Agree with above. Wife at bedside. Dr. [name] at bedside.  Discussed swallowing difficulties. To progress to reg diet.  The family can bring food in for patient. Increase activity - PT ordered. Okay to move to reg bed from surgery standpoint.  Alphonsa Overall, MD, [facility] Pager: [phone] Office phone:  [phone]

## 2014-10-08 ENCOUNTER — Inpatient Hospital Stay (HOSPITAL_COMMUNITY): Payer: 59

## 2014-10-08 ENCOUNTER — Encounter: Payer: Self-pay | Admitting: Oncology

## 2014-10-08 DIAGNOSIS — R600 Localized edema: Secondary | ICD-10-CM

## 2014-10-08 DIAGNOSIS — I10 Essential (primary) hypertension: Secondary | ICD-10-CM

## 2014-10-08 LAB — BASIC METABOLIC PANEL
Anion gap: 6 (ref 5–15)
BUN: <5 mg/dL — ABNORMAL LOW (ref 6–20)
CALCIUM: 7.2 mg/dL — AB (ref 8.9–10.3)
CO2: 22 mmol/L (ref 22–32)
CREATININE: 0.51 mg/dL — AB (ref 0.61–1.24)
Chloride: 106 mmol/L (ref 101–111)
GFR calc Af Amer: >60 mL/min (ref >60)
GFR calc non Af Amer: >60 mL/min (ref >60)
GLUCOSE: 87 mg/dL (ref 65–99)
Potassium: 4.5 mmol/L (ref 3.5–5.1)
SODIUM: 134 mmol/L — AB (ref 135–145)

## 2014-10-08 LAB — CBC
HCT: 26.4 % — ABNORMAL LOW (ref 39.0–52.0)
HEMOGLOBIN: 8.1 g/dL — AB (ref 13.0–17.0)
MCH: 25.7 pg — ABNORMAL LOW (ref 26.0–34.0)
MCHC: 30.7 g/dL (ref 30.0–36.0)
MCV: 83.8 fL (ref 78.0–100.0)
Platelets: 402 10*3/uL — ABNORMAL HIGH (ref 150–400)
RBC: 3.15 MIL/uL — AB (ref 4.22–5.81)
RDW: 17.6 % — ABNORMAL HIGH (ref 11.5–15.5)
WBC: 11.5 10*3/uL — AB (ref 4.0–10.5)

## 2014-10-08 MED ORDER — MEGESTROL ACETATE 40 MG/ML PO SUSP
400.0000 mg | Freq: Every day | ORAL | Status: DC
  Administered 2014-10-09 – 2014-10-10 (×2): 400 mg via ORAL
  Filled 2014-10-08 (×2): qty 10

## 2014-10-08 MED ORDER — ZOLPIDEM TARTRATE 5 MG PO TABS: 5.0000 mg | ORAL_TABLET | Freq: Every evening | ORAL | Status: DC | PRN

## 2014-10-08 MED ORDER — PRIMIDONE 250 MG PO TABS
250.0000 mg | ORAL_TABLET | Freq: Every day | ORAL | Status: DC
  Administered 2014-10-09: 250 mg via ORAL
  Filled 2014-10-08: qty 1

## 2014-10-08 MED ORDER — PROPRANOLOL HCL 10 MG PO TABS
10.0000 mg | ORAL_TABLET | Freq: Two times a day (BID) | ORAL | Status: DC
  Administered 2014-10-08 – 2014-10-10 (×5): 10 mg via ORAL
  Filled 2014-10-08 (×5): qty 1

## 2014-10-08 MED ORDER — DIPHENOXYLATE-ATROPINE 2.5-0.025 MG PO TABS
1.0000 | ORAL_TABLET | Freq: Two times a day (BID) | ORAL | Status: DC
  Administered 2014-10-08 – 2014-10-10 (×3): 1 via ORAL
  Filled 2014-10-08 (×4): qty 1

## 2014-10-08 MED ORDER — PRIMIDONE 250 MG PO TABS
250.0000 mg | ORAL_TABLET | Freq: Every day | ORAL | Status: DC
  Administered 2014-10-08: 250 mg via ORAL
  Filled 2014-10-08: qty 1

## 2014-10-08 MED ORDER — DIPHENHYDRAMINE HCL 25 MG PO CAPS: 25.0000 mg | ORAL_CAPSULE | Freq: Every evening | ORAL | Status: DC | PRN

## 2014-10-08 NOTE — Progress Notes (Signed)
I faxed forms/notes to [employer]--disability  [phone]

## 2014-10-08 NOTE — Progress Notes (Signed)
Nutrition Follow-up  DOCUMENTATION CODES:  Severe malnutrition in context of chronic illness  INTERVENTION: - Continue Boost Plus BID, Ensure BID, and Kozy Shack pudding BID - RD will continue to monitor for needs  NUTRITION DIAGNOSIS:  Malnutrition related to chronic illness as evidenced by severe depletion of body fat, severe depletion of muscle mass. -ongoing  GOAL:  Patient will meet greater than or equal to 90% of their needs -variably met with meals and supplements  MONITOR:  PO intake, Supplement acceptance, Labs, Weight trends  REASON FOR ASSESSMENT:  Consult Assessment of nutrition requirement/status  ASSESSMENT: 69 y.o. male with h/o adenocarcinoma carcinomatosis, CAD, anemia, hypertension, recently discharged from the hospital presents today with fever, sob and generalized weakness.   6/16: - Pt ate 20% of breakfast yesterday and 60% of breakfast today - He states appetite is improving but that it is "still not where I want it to be." - Visitor/family member in the room states "artificial help" for appetite increase which is likely referring to order for Megace - He states drinking several Boost or Ensure each day - Variably meeting needs and denies any questions or concerns at this time - Medications reviewed  Labs: Na: 134 mmol/L, BUN/creatinine low, Ca: 7.2 mg/dL  6/14: - Pt reports poor po and poor appetite prior to admission.  - Usual body weight is 180 lbs. 10 lb wt loss in the past 5 weeks.  - Recently started Megace. Hopefully will improve appetite.  - Wife reports that pt was having skin breakdown which has since resolved.  - Discussed importance of incorporating protein into every meal.  - Pt also having issues recently with reflux. Advised to remain upright after meals.   Height:  Ht Readings from Last 1 Encounters:  10/05/14 6' 1"  (1.854 m)    Weight:  Wt Readings from Last 1 Encounters:  10/07/14 179 lb 3.7 oz (81.3 kg)     Ideal Body Weight:  83.6 kg  Wt Readings from Last 10 Encounters:  10/07/14 179 lb 3.7 oz (81.3 kg)  10/05/14 173 lb 6.4 oz (78.654 kg)  09/23/14 170 lb 9.6 oz (77.384 kg)  09/06/14 170 lb 14.4 oz (77.52 kg)  08/25/14 179 lb 11.2 oz (81.511 kg)  08/18/14 173 lb (78.472 kg)  08/11/14 181 lb 12.8 oz (82.464 kg)  07/28/14 181 lb 12.8 oz (82.464 kg)  07/08/14 183 lb 11.2 oz (83.326 kg)  07/02/14 191 lb 2.2 oz (86.7 kg)    BMI:  Body mass index is 23.65 kg/(m^2).  Estimated Nutritional Needs:  Kcal:  2100-2300  Protein:  115-130 g  Fluid:  2.1-2.3 L/day  Skin:  Reviewed, no issues  Diet Order:  Diet regular Room service appropriate?: Yes; Fluid consistency:: Thin  EDUCATION NEEDS:  Education needs addressed   Intake/Output Summary (Last 24 hours) at 10/08/14 0947 Last data filed at 10/08/14 0828  Gross per 24 hour  Intake 2423.75 ml  Output   1050 ml  Net 1373.75 ml    Last BM:  6/15    Jarome Matin, RD, LDN Inpatient Clinical Dietitian Pager # [phone] After hours/weekend pager # [phone]

## 2014-10-08 NOTE — Progress Notes (Signed)
VASCULAR LAB PRELIMINARY  PRELIMINARY  PRELIMINARY  PRELIMINARY  Bilateral lower extremity venous duplex completed.    Preliminary report:  Bilateral:  No evidence of DVT, superficial thrombosis, or Baker's Cyst.   Kentley Cedillo, RVS 10/08/2014, 10:36 AM

## 2014-10-08 NOTE — Progress Notes (Addendum)
Subjective: He looks better, his wife thinks and he thinks he is more distended than yesterday, taking regular diet, no nausea, still having loose stools, but significantly smaller number. C diff and stool cultures are all negative so far.  GERD is much better on Carafate.  He has not been up much, but is feeling better.  Drainage from the IR dain is still cloudy but much clearer/serous in character.    Objective: Vital signs in last 24 hours: Temp:  [97.5 F (36.4 C)-98.5 F (36.9 C)] 98.5 F (36.9 C) (06/16 0445) Pulse Rate:  [95-107] 97 (06/16 0445) Resp:  [12-29] 24 (06/16 0445) BP: (77-133)/(41-85) 127/80 mmHg (06/16 0445) SpO2:  [94 %-99 %] 95 % (06/16 0445) Last BM Date: 10/07/14 240 PO recorded  Diet: regular Drain down to 75 ml 1 stool recorded yesterday Afebrile, VSS Labs show slow improvement Intake/Output from previous day: 06/15 0701 - 06/16 0700 In: 2423.8 [P.O.:240; I.V.:1768.8; IV Piggyback:400] Out: 950 [Urine:875; Drains:75] Intake/Output this shift: Total I/O In: 240 [P.O.:240] Out: 100 [Urine:100]  General appearance: alert, cooperative and no distress GI: distended, the cellulitis is perhaps a little better, he has very hypoactive BS and what I heard were high pitched.  he has some of abdominal wall edema also.    Lab Results:   Recent Labs  10/07/14 0537 10/08/14 0435  WBC 14.6* 11.5*  HGB 8.2* 8.1*  HCT 25.8* 26.4*  PLT 419* 402*    BMET  Recent Labs  10/07/14 0537 10/08/14 0435  NA 128* 134*  K 3.9 4.5  CL 103 106  CO2 20* 22  GLUCOSE 97 87  BUN 8 <5*  CREATININE 0.54* 0.51*  CALCIUM 6.7* 7.2*   PT/INR  Recent Labs  10/05/14 1942  LABPROT 17.9*  INR 1.47     Recent Labs Lab 10/05/14 0837 10/06/14 0545  AST 20 18  ALT 17 16*  ALKPHOS 165* 114  BILITOT 0.72 0.5  PROT 6.0* 5.1*  ALBUMIN 1.3* 1.2*     Lipase     Component Value Date/Time   LIPASE 32 09/06/2014 0932     Studies/Results: No results  found.  Medications: . antiseptic oral rinse  7 mL Mouth Rinse q12n4p  . chlorhexidine  15 mL Mouth Rinse BID  . enoxaparin (LOVENOX) injection  40 mg Subcutaneous Q24H  . famotidine  20 mg Oral BID  . feeding supplement (ENSURE ENLIVE)  237 mL Oral BID BM  . lactose free nutrition  237 mL Oral BID BM  . megestrol  200 mg Oral BID  . midazolam  1 mg Intravenous Once  . multivitamin with minerals  1 tablet Oral Daily  . pantoprazole  40 mg Oral BID  . piperacillin-tazobactam (ZOSYN)  IV  3.375 g Intravenous 3 times per day  . potassium chloride  20 mEq Oral TID PC  . primidone  250 mg Oral Daily  . saccharomyces boulardii  250 mg Oral BID  . sucralfate  1 g Oral TID AC & HS  . vancomycin  1,250 mg Intravenous Q12H    Assessment/Plan Fever, SOB, generalized weakness,  Abdominal Carcinomatosis with recurrent obstruction S/p exploratory laparotomy, small bowel bypass, and colon resection 09/09/14 Dr. Excell Seltzer  Sepsis from Intraabdominal abscess/ questionable HCAP/ ABDOMINAL WALL cellulitis.  Culture still multiple organisms nor predominate organism on culture at this posting. IR percutaneous drain placement for retroperitoneal abscess, 10/05/14. Anemia Hgb - 8.1 - 10/08/2014 Hx of hypertension Hypokalemia Diarrhea Antibiotics: Day 4 Zosyn and Vancomycin DVT: Adding  SCD, and Lovenox per pharmacy for DVT prophylaxis   Plan:  Continue current Rx, I will get some films on him. I don't know if it's bowel distension or ascites.    LOS: 3 days   Dillon,Wesley 10/08/2014  Agree with above. Moved to [place].  His wife is in the room. Trying reg food, which he is tolerating. He walked in the hall today. He is a little better every day.  Alphonsa Overall, MD, [facility] Surgery Pager: [phone] Office phone:  [phone]

## 2014-10-08 NOTE — Care Management Note (Signed)
Case Management Note  Patient Details  Name: Wesley Dillon MRN: [id number] Date of Birth: 30-Mar-1946  Subjective/Objective:    Sepsis,intra abdominal abscess-Iv abx.ccs/IR following.                Action/Plan:d/c plan home.No anticipated d/c needs.   Expected Discharge Date:   (unknown)               Expected Discharge Plan:  Home/Self Care  In-House Referral:     Discharge planning Services  CM Consult  Post Acute Care Choice:    Choice offered to:     DME Arranged:    DME Agency:  [place]:    [place] Agency:     Status of Service:  In process, will continue to follow  Medicare Important Message Given:    Date Medicare IM Given:    Medicare IM give by:    Date Additional Medicare IM Given:    Additional Medicare Important Message give by:     If discussed at [place] of Stay Meetings, dates discussed:    Additional Comments:  Dessa Phi, RN 10/08/2014, 1:21 PM

## 2014-10-08 NOTE — Progress Notes (Signed)
[facility] PROGRESS NOTE  Wesley Dillon LSL:[id number] DOB: 08-30-1945 DOA: 10/05/2014 PCP: Wesley Heck, MD Interim summary:  Wesley Dillon is a 69 y.o. male with h/o adenocarcinoma carcinomatosis, CAD, anemia, hypertension, recently discharged from the hospital who presented with sudden onset fever, sob and generalized weakness.  He was found to have a large abdominal abscess and possible HCAP and severe sepsis.  He was admitted to step down and surgery consulted.  He did not require basal pressors. He was started on IV vancomycin and Zosyn and underwent interventional radiology percutaneous drain placement on 6/13. He was mildly hypotensive but his blood pressures improved with IV fluids and antibiotics and have remained stable.  Assessment/Plan:  Severe sepsis from abdominal abscess/ questionable HCAP/ Abdominal Wall cellulitis, resolving.  -  Continue IV vancomycin and IV zosyn day 4 -  BCx NGTD -  Wound culture:  Mixed GPC and GNR -  Appreciate general surgery and interventional radiology assistance -  We will likely need a long course of antibiotics pending resolution of abscess -  Anticipate he will need repeat CT scan in approximately 1 week to determine the size and characteristics of his abscess  Abdominal carcinomatosis with increasing abdominal distension with recent ex lap with small bowel bypass and colon resection by Dr. Excell Dillon in May 2016 -  Management per Dr Wesley Dillon.  -  KUB ordered by general surgery  Severe protein calorie malnutrition -  Appreciate nutrition assistance -  Regular diet and supplements  Demand ischemia with mildly elevated troponin secondary to sepsis -  Appreciate cardiology assistance and anticipate no further workup at this time  Hyponatremia, resolving with IV fluids  Hypokalemia, resolved with IV repletion  Leukocytosis due to sepsis, and trending down with antibiotics -   trend white blood cell count   Anemia of  chronic disease, hemoglobin drifting down slowly  -  Transfuse for hemoglobin less than 7 or evidence of symptomatic anemia   Diarrhea, 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   Ascending aortic aneurysm, 4.8 cm on CT chest. Recommend follow-up with CT surgery as an outpatient depending on prognosis and treatment of his underlying malignancy. Currently the patient is too ill and infected to undergo surgical repair.  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     Consultants:  Cardiology  General Surgery  Interventional Radiology  Procedures: -  IR percutaneous drain placement for retroperitoneal abscess, 10/05/14 -  CT angio chest abd/pelvis on 6/13  Antibiotics: vancomycin 6/13 >> zosyn 6/13 >>  HPI/Subjective:  Very anxious and with mildy pressure speech according to wife.  Appetite is much better and ate a large breakfast.  Still has significant redness but improving on abdomen.  Denies nausea.  Persistent chronic diarrhea.    Objective: Filed Vitals:   10/08/14 0445  BP: 127/80  Pulse: 97  Temp: 98.5 F (36.9 C)  Resp: 24    Intake/Output Summary (Last 24 hours) at 10/08/14 1208 Last data  filed at 10/08/14 4098  Gross per 24 hour  Intake 2423.75 ml  Output   1050 ml  Net 1373.75 ml   Filed Weights   10/05/14 1845 10/07/14 0400  Weight: 77.2 kg (170 lb 3.1 oz) 81.3 kg (179 lb 3.7 oz)    Exam:   General:  Adult male, lying in bed, no acute  distress, cachectic, smiling and revved up  Cardiovascular:  Sinus tachycardia, normal s1s2, no murmurs, rubs, gallops   Respiratory: diminished at bases, no wheezing, rales, or rhonchi Abdomen:   NABS, healed craniocaudal midline incision in the lower abdomen with splotchy areas of persistent pink erythema in the superior areas and a more confluent area of erythema at the base of the incision tracking minimially laterally. His abdomen feels warm to the touch and firm at the base, right and left lower quadrants, without rebound or guarding. His drain is draining light green mostly serous material with some purulence Musculoskeletal:  Decreased muscle bulk and tone, 2+ left lower extremity edema and 1+ right lower extremity edema which the patient only states is stable.    Data Reviewed: Basic Metabolic Panel:  Recent Labs Lab 10/05/14 0837 10/05/14 1350 10/06/14 0545 10/07/14 0537 10/08/14 0435  NA 127* 123* 127* 128* 134*  K 3.4* 3.6 3.1* 3.9 4.5  CL  --  94* 98* 103 106  CO2 20*  --  21* 20* 22  GLUCOSE 97 95 116* 97 87  BUN 7.3 7 9 8  <5*  CREATININE 0.6* 0.50* 0.43* 0.54* 0.51*  CALCIUM 7.6*  --  6.9* 6.7* 7.2*  MG  --   --  1.4* 1.8  --    Liver Function Tests:  Recent Labs Lab 10/05/14 0837 10/06/14 0545  AST 20 18  ALT 17 16*  ALKPHOS 165* 114  BILITOT 0.72 0.5  PROT 6.0* 5.1*  ALBUMIN 1.3* 1.2*   No results for input(s): LIPASE, AMYLASE in the last 168 hours. No results for input(s): AMMONIA in the last 168 hours. CBC:  Recent Labs Lab 10/05/14 0837 10/05/14 1350 10/06/14 0545 10/07/14 0537 10/08/14 0435  WBC 20.3* 20.5* 15.1* 14.6* 11.5*  NEUTROABS 18.2* 19.1*  --   --   --   HGB 8.8* 8.6*  9.9* 8.5* 8.2* 8.1*  HCT 27.2* 26.3*  29.0* 26.2* 25.8* 26.4*  MCV 82.4 83.8 82.9 82.2 83.8  PLT 378 434* 383 419* 402*   Cardiac Enzymes: No results for input(s): CKTOTAL, CKMB, CKMBINDEX, TROPONINI in the last 168 hours. BNP (last 3 results)  Recent Labs   10/06/14 0545  BNP 574.2*    ProBNP (last 3 results) No results for input(s): PROBNP in the last 8760 hours.  CBG: No results for input(s): GLUCAP in the last 168 hours.  Recent Results (from the past 240 hour(s))  Blood culture (routine x 2)     Status: None (Preliminary result)   Collection Time: 10/05/14  1:53 PM  Result Value Ref Range Status   Specimen Description BLOOD LEFT ANTECUBITAL  Final   Special Requests BOTTLES DRAWN AEROBIC AND ANAEROBIC 5CC EACH  Final   Culture   Final           BLOOD CULTURE RECEIVED NO GROWTH TO DATE CULTURE WILL BE HELD FOR 5 DAYS BEFORE ISSUING A FINAL NEGATIVE REPORT Performed at [facility]    Report Status PENDING  Incomplete  Blood culture (routine x 2)     Status: None (Preliminary result)   Collection Time: 10/05/14  1:55  PM  Result Value Ref Range Status   Specimen Description BLOOD RIGHT ANTECUBITAL  Final   Special Requests BOTTLES DRAWN AEROBIC AND ANAEROBIC 5CC EACH  Final   Culture   Final           BLOOD CULTURE RECEIVED NO GROWTH TO DATE CULTURE WILL BE HELD FOR 5 DAYS BEFORE ISSUING A FINAL NEGATIVE REPORT Performed at [facility]    Report Status PENDING  Incomplete  Clostridium Difficile by PCR (not at [facility])     Status: None   Collection Time: 10/05/14  7:45 PM  Result Value Ref Range Status   C difficile by pcr NEGATIVE NEGATIVE Final  Culture, routine-abscess     Status: None (Preliminary result)   Collection Time: 10/05/14  9:57 PM  Result Value Ref Range Status   Specimen Description DRAINAGE  Final   Special Requests Normal  Final   Gram Stain   Final    FEW WBC PRESENT, PREDOMINANTLY PMN NO SQUAMOUS EPITHELIAL CELLS SEEN ABUNDANT GRAM NEGATIVE RODS ABUNDANT GRAM POSITIVE COCCI IN PAIRS FEW GRAM POSITIVE RODS    Culture   Final    MULTIPLE ORGANISMS PRESENT, NONE PREDOMINANT Performed at [facility]    Report Status PENDING  Incomplete     Studies: No results  found.  Scheduled Meds: . antiseptic oral rinse  7 mL Mouth Rinse q12n4p  . chlorhexidine  15 mL Mouth Rinse BID  . enoxaparin (LOVENOX) injection  40 mg Subcutaneous Q24H  . famotidine  20 mg Oral BID  . feeding supplement (ENSURE ENLIVE)  237 mL Oral BID BM  . lactose free nutrition  237 mL Oral BID BM  . megestrol  200 mg Oral BID  . midazolam  1 mg Intravenous Once  . multivitamin with minerals  1 tablet Oral Daily  . pantoprazole  40 mg Oral BID  . piperacillin-tazobactam (ZOSYN)  IV  3.375 g Intravenous 3 times per day  . potassium chloride  20 mEq Oral TID PC  . primidone  250 mg Oral Daily  . saccharomyces boulardii  250 mg Oral BID  . sucralfate  1 g Oral TID AC & HS  . vancomycin  1,250 mg Intravenous Q12H   Continuous Infusions: . 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

## 2014-10-08 NOTE — Evaluation (Signed)
Physical Therapy One Time Evaluation Patient Details Name: Wesley Dillon MRN: [id number] DOB: 1945/11/06 Today's Date: 10/08/2014   History of Present Illness  69 y.o. male with h/o adenocarcinoma carcinomatosis, CAD, anemia, hypertension, benign essential tremor, macular degeneration and admitted for severe sepsis from abdominal abscess/ questionable HCAP/ Abdominal Wall cellulitis  Clinical Impression  Patient evaluated by Physical Therapy with no further acute PT needs identified. All education has been completed and the patient has no further questions. Pt ambulating in hallway min/guard with IV pole.  Spouse very involved in pt's care and reports she has been walking with him at home.  Pt and spouse feel comfortable mobilizing in hospital without need for acute PT.  Pt reports having one HHPT session prior to admission and agreeable to continue f/u with HHPT upon d/c.  Will defer further needs to HHPT. PT is signing off. Thank you for this referral.     Follow Up Recommendations Home health PT    Equipment Recommendations  None recommended by PT    Recommendations for Other Services       Precautions / Restrictions Precautions Precautions: Fall      Mobility  Bed Mobility Overal bed mobility: Needs Assistance Bed Mobility: Supine to Sit     Supine to sit: Supervision;HOB elevated     General bed mobility comments: increased time and effort  Transfers Overall transfer level: Needs assistance Equipment used: None Transfers: Sit to/from Stand Sit to Stand: Min guard         General transfer comment: pt reports feeling unsteady, min/guard for safety  Ambulation/Gait Ambulation/Gait assistance: Min guard Ambulation Distance (Feet): 160 Feet Assistive device: None Gait Pattern/deviations: Step-through pattern Gait velocity: decr   General Gait Details: pt pushed IV pole with bil UEs, seemed to be using IV pole for support,   Stairs             Wheelchair Mobility    Modified Rankin (Stroke Patients Only)       Balance                                             Pertinent Vitals/Pain Pain Assessment: No/denies pain  Pt reports mild SOB after activity however SpO2 97% on room air and HR 105 bpm    Home Living Family/patient expects to be discharged to:: Private residence Living Arrangements: Spouse/significant other   Type of Home: House Home Access: Level entry     Home Layout: One level Home Equipment: None      Prior Function Level of Independence: Independent               Hand Dominance        Extremity/Trunk Assessment               Lower Extremity Assessment: Generalized weakness         Communication   Communication: No difficulties  Cognition Arousal/Alertness: Awake/alert Behavior During Therapy: WFL for tasks assessed/performed Overall Cognitive Status: Within Functional Limits for tasks assessed                      General Comments      Exercises        Assessment/Plan    PT Assessment All further PT needs can be met in the next venue of care  PT Diagnosis Generalized weakness;Difficulty walking  PT Problem List Decreased strength;Decreased activity tolerance;Decreased mobility  PT Treatment Interventions     PT Goals (Current goals can be found in the Care Plan section) Acute Rehab PT Goals PT Goal Formulation: All assessment and education complete, DC therapy    Frequency     Barriers to discharge        Co-evaluation               End of Session   Activity Tolerance: Patient tolerated treatment well Patient left: in bed;with call bell/phone within reach;with family/visitor present           Time: 7414-2395 PT Time Calculation (min) (ACUTE ONLY): 13 min   Charges:   PT Evaluation $Initial PT Evaluation Tier I: 1 Procedure     PT G Codes:        Jermey Closs,KATHrine E 10/08/2014, 3:07 PM Carmelia Bake, PT,  DPT 10/08/2014 Pager: [phone]

## 2014-10-09 ENCOUNTER — Telehealth: Payer: Self-pay

## 2014-10-09 DIAGNOSIS — L03311 Cellulitis of abdominal wall: Secondary | ICD-10-CM | POA: Insufficient documentation

## 2014-10-09 LAB — CULTURE, ROUTINE-ABSCESS: Special Requests: NORMAL

## 2014-10-09 LAB — CBC
HCT: 25.1 % — ABNORMAL LOW (ref 39.0–52.0)
Hemoglobin: 7.8 g/dL — ABNORMAL LOW (ref 13.0–17.0)
MCH: 26.1 pg (ref 26.0–34.0)
MCHC: 31.1 g/dL (ref 30.0–36.0)
MCV: 83.9 fL (ref 78.0–100.0)
PLATELETS: 363 10*3/uL (ref 150–400)
RBC: 2.99 MIL/uL — ABNORMAL LOW (ref 4.22–5.81)
RDW: 17.7 % — ABNORMAL HIGH (ref 11.5–15.5)
WBC: 10.3 10*3/uL (ref 4.0–10.5)

## 2014-10-09 LAB — BASIC METABOLIC PANEL
Anion gap: 5 (ref 5–15)
BUN: <5 mg/dL — ABNORMAL LOW (ref 6–20)
CALCIUM: 7.2 mg/dL — AB (ref 8.9–10.3)
CHLORIDE: 103 mmol/L (ref 101–111)
CO2: 23 mmol/L (ref 22–32)
CREATININE: 0.56 mg/dL — AB (ref 0.61–1.24)
GFR calc Af Amer: >60 mL/min (ref >60)
GFR calc non Af Amer: >60 mL/min (ref >60)
GLUCOSE: 81 mg/dL (ref 65–99)
Potassium: 4.6 mmol/L (ref 3.5–5.1)
SODIUM: 131 mmol/L — AB (ref 135–145)

## 2014-10-09 MED ORDER — MEGESTROL ACETATE 40 MG/ML PO SUSP: 400.0000 mg | Freq: Every day | ORAL | Status: AC

## 2014-10-09 MED ORDER — SUCRALFATE 1 G PO TABS: 1.0000 g | ORAL_TABLET | Freq: Three times a day (TID) | ORAL | Status: AC

## 2014-10-09 MED ORDER — CIPROFLOXACIN HCL 500 MG PO TABS: 500.0000 mg | ORAL_TABLET | Freq: Two times a day (BID) | ORAL | Status: DC

## 2014-10-09 MED ORDER — METRONIDAZOLE 500 MG PO TABS: 500.0000 mg | ORAL_TABLET | Freq: Three times a day (TID) | ORAL | Status: DC

## 2014-10-09 MED ORDER — SODIUM CHLORIDE 0.9 % IJ SOLN: 10.0000 mL | Freq: Every day | INTRAMUSCULAR | Status: DC

## 2014-10-09 MED ORDER — ALUM & MAG HYDROXIDE-SIMETH 200-200-20 MG/5ML PO SUSP
30.0000 mL | ORAL | Status: DC | PRN
Start: 2014-10-09 — End: 2014-10-10
  Administered 2014-10-09: 30 mL via ORAL
  Filled 2014-10-09: qty 30

## 2014-10-09 MED ORDER — PANTOPRAZOLE SODIUM 40 MG PO TBEC: 40.0000 mg | DELAYED_RELEASE_TABLET | Freq: Two times a day (BID) | ORAL | Status: DC

## 2014-10-09 NOTE — Progress Notes (Signed)
[facility] Progress Note     Subjective: Pt feels much better.  No N/V, tolerating reg diet.  Ambulating through the halls well.  Having BM's and flatus.  Drain putting out less but was 14mL/24hr.  Abdominal cellulitis improved.  Wants to go home soon.  Objective: Vital signs in last 24 hours: Temp:  [98.1 F (36.7 C)-98.7 F (37.1 C)] 98.1 F (36.7 C) (06/17 0551) Pulse Rate:  [76-94] 76 (06/17 0551) Resp:  [18-24] 24 (06/17 0551) BP: (110-123)/(69-79) 110/69 mmHg (06/17 0551) SpO2:  [95 %-98 %] 95 % (06/17 0551) Last BM Date: 2014/10/27  Intake/Output from previous day: 2022/10/27 0701 - 06/17 0700 In: 3261.3 [P.O.:810; I.V.:1786.3; IV Piggyback:650] Out: 2545 [Urine:2425; Drains:120] Intake/Output this shift:    PE: Gen:  Alert, NAD, pleasant Abd: Soft, mildly distended, mild tenderness around IR drain in RLQ and over area of abdominal wall cellulitis at the inferior aspect of his abdomen.   Incision is well healed and intact.  +BS, no HSM, drain with mostly clear with minimal cloudiness output Ext:  No erythema, edema, or tenderness  Lab Results:   Recent Labs  2014-10-27 0435 10/09/14 0440  WBC 11.5* 10.3  HGB 8.1* 7.8*  HCT 26.4* 25.1*  PLT 402* 363   BMET  Recent Labs  10-27-14 0435 10/09/14 0440  NA 134* 131*  K 4.5 4.6  CL 106 103  CO2 22 23  GLUCOSE 87 81  BUN <5* <5*  CREATININE 0.51* 0.56*  CALCIUM 7.2* 7.2*   PT/INR No results for input(s): LABPROT, INR in the last 72 hours. CMP     Component Value Date/Time   NA 131* 10/09/2014 0440   NA 127* 10/05/2014 0837   K 4.6 10/09/2014 0440   K 3.4* 10/05/2014 0837   CL 103 10/09/2014 0440   CO2 23 10/09/2014 0440   CO2 20* 10/05/2014 0837   GLUCOSE 81 10/09/2014 0440   GLUCOSE 97 10/05/2014 0837   BUN <5* 10/09/2014 0440   BUN 7.3 10/05/2014 0837   CREATININE 0.56* 10/09/2014 0440   CREATININE 0.6* 10/05/2014 0837   CALCIUM 7.2* 10/09/2014 0440   CALCIUM 7.6* 10/05/2014 0837    PROT 5.1* 10/06/2014 0545   PROT 6.0* 10/05/2014 0837   ALBUMIN 1.2* 10/06/2014 0545   ALBUMIN 1.3* 10/05/2014 0837   AST 18 10/06/2014 0545   AST 20 10/05/2014 0837   ALT 16* 10/06/2014 0545   ALT 17 10/05/2014 0837   ALKPHOS 114 10/06/2014 0545   ALKPHOS 165* 10/05/2014 0837   BILITOT 0.5 10/06/2014 0545   BILITOT 0.72 10/05/2014 0837   GFRNONAA >60 10/09/2014 0440   GFRAA >60 10/09/2014 0440   Lipase     Component Value Date/Time   LIPASE 32 09/06/2014 0932       Studies/Results: Dg Abd 2 Views  10/27/14   CLINICAL DATA:  Recent small bowel obstruction, known abdominal carcinomatosis  EXAM: ABDOMEN - 2 VIEW  COMPARISON:  10/05/2014  FINDINGS: Scattered large and small bowel gas is noted. A drainage catheter is noted in the right mid abdomen. Bibasilar atelectatic changes with associated effusions are noted. No definitive free air is seen. No true obstructive changes are noted.  IMPRESSION: Bibasilar atelectasis and associated effusions similar to that seen on recent CT examination.  Right mid abdominal drainage catheter in satisfactory position.  No true obstructive changes are noted at this time.   Electronically Signed   By: Inez Catalina M.D.   On: 2014-10-27 15:34  Anti-infectives: Anti-infectives    Start     Dose/Rate Route Frequency Ordered Stop   10/08/14 0400  vancomycin (VANCOCIN) 1,250 mg in sodium chloride 0.9 % 250 mL IVPB     1,250 mg 166.7 mL/hr over 90 Minutes Intravenous Every 12 hours 10/07/14 1638     10/06/14 0400  vancomycin (VANCOCIN) IVPB 1000 mg/200 mL premix     1,000 mg 200 mL/hr over 60 Minutes Intravenous Every 12 hours 10/05/14 1625 10/07/14 1731   10/05/14 2200  piperacillin-tazobactam (ZOSYN) IVPB 3.375 g     3.375 g 12.5 mL/hr over 240 Minutes Intravenous 3 times per day 10/05/14 1625     10/05/14 1415  vancomycin (VANCOCIN) IVPB 1000 mg/200 mL premix     1,000 mg 200 mL/hr over 60 Minutes Intravenous  Once 10/05/14 1414 10/05/14 1614    10/05/14 1415  piperacillin-tazobactam (ZOSYN) IVPB 3.375 g     3.375 g 100 mL/hr over 30 Minutes Intravenous  Once 10/05/14 1414 10/05/14 1511       Assessment/Plan Fever, SOB, generalized weakness Abdominal Carcinomatosis with recurrent obstruction S/p exploratory laparotomy, small bowel bypass, and colon resection 09/09/14 Dr. Excell Seltzer  Sepsis from Intraabdominal abscess/ questionable HCAP/ ABDOMINAL WALL cellulitis.  Culture still multiple organisms nor predominate organism on culture at this posting. IR percutaneous drain placement for retroperitoneal ascites/abscess, 10/05/14. Anemia -Hgb - 7.8 - 10/09/2014 Hx of hypertension Hypokalemia Diarrhea Antibiotics: Day #4 Zosyn and Vancomycin DVT: SCD, and Lovenox per pharmacy for DVT prophylaxis  Plan:  -Seems to be improving clinically, WBC is normal at 10.3, temp is normal. -Drain management including daily dressing change to perc drain, Wife willing to do drain flushes -Tolerating a reg diet -Cellulitis of abdominal wall improving on zosyn, may be able to switch to oral ABX -Dr. Ammie Dalton folowing -Dr. [name] following for swallowing difficulties, but seemed to do well with breakfast    LOS: 4 days    Nat Christen 10/09/2014, 8:13 AM Pager: [phone]  Agree with above. Okay to go home. Can follow up with drain clinic 10/20/2014.  Dr. Excell Seltzer will see him back on 10/30/2014. Will follow while in hospital.  Alphonsa Overall, MD, [facility] Pager: [phone] Office phone:  [phone]

## 2014-10-09 NOTE — Telephone Encounter (Signed)
Pt is going home from hospital tomorrow, not today. Pt states Dr [name] wants to see him next week. Pt will not be available June 28 d/t another appt.

## 2014-10-09 NOTE — Progress Notes (Signed)
Referring Physician(s): CCS  Subjective: Patient denies any pain at drain site. He is tolerating regular diet without N/V.  Allergies: Review of patient's allergies indicates no known allergies.  Medications: Prior to Admission medications   Medication Sig Start Date End Date Taking? Authorizing Provider  acetaminophen (TYLENOL) 500 MG tablet Take 1,000 mg by mouth every 6 (six) hours as needed for moderate pain or headache.   Yes Historical Provider, MD  doxycycline (VIBRA-TABS) 100 MG tablet Take 1 tablet (100 mg total) by mouth 2 (two) times daily. 10/05/14  Yes Ladell Pier, MD  fluconazole (DIFLUCAN) 100 MG tablet Take 1 tablet (100 mg total) by mouth daily. 10/05/14  Yes Ladell Pier, MD  ibuprofen (ADVIL,MOTRIN) 200 MG tablet Take 800 mg by mouth every 6 (six) hours as needed for headache or moderate pain.   Yes Historical Provider, MD  ipratropium (ATROVENT) 0.06 % nasal spray Place 2 sprays into both nostrils every 6 (six) hours as needed for rhinitis.   Yes Historical Provider, MD  lidocaine-prilocaine (EMLA) cream Apply 1 application topically as needed. Apply to [facility] 1-2 hours prior to stick and cover with plastic wrap 07/08/14  Yes Ladell Pier, MD  loperamide (IMODIUM) 2 MG capsule Take 2 mg by mouth as needed for diarrhea or loose stools.   Yes Historical Provider, MD  Multiple Vitamins-Minerals (PRESERVISION AREDS PO) Take 2 capsules by mouth every morning.    Yes Historical Provider, MD  ondansetron (ZOFRAN ODT) 4 MG disintegrating tablet Take 1 tablet (4 mg total) by mouth every 4 (four) hours as needed for nausea or vomiting. 08/11/14  Yes Ladell Pier, MD  oxyCODONE (OXY IR/ROXICODONE) 5 MG immediate release tablet Take 1-2 tablets (5-10 mg total) by mouth every 4 (four) hours as needed for severe pain. 07/28/14  Yes Ladell Pier, MD  PRESCRIPTION MEDICATION Chemo [facility]   Yes Historical Provider, MD  primidone (MYSOLINE) 250 MG tablet Take 1 tablet (250 mg  total) by mouth at bedtime. Patient taking differently: Take 250 mg by mouth every morning.  10/21/13  Yes Asencion Partridge Dohmeier, MD  prochlorperazine (COMPAZINE) 10 MG tablet Take 1 tablet (10 mg total) by mouth every 6 (six) hours as needed for nausea. 07/08/14  Yes Ladell Pier, MD  propranolol (INDERAL) 10 MG tablet Take 1 tablet (10 mg total) by mouth 2 (two) times daily. 09/23/14  Yes Ladell Pier, MD  ranitidine (ZANTAC) 150 MG tablet Take 150 mg by mouth 2 (two) times daily.   Yes Historical Provider, MD  saccharomyces boulardii (FLORASTOR) 250 MG capsule Take 1 capsule (250 mg total) by mouth 2 (two) times daily. 09/18/14  Yes Donne Hazel, MD  diphenoxylate-atropine (LOMOTIL) 2.5-0.025 MG per tablet Take 1 tablet by mouth 4 (four) times daily as needed for diarrhea or loose stools. Patient not taking: Reported on 10/05/2014 10/05/14   Ladell Pier, MD  HYDROcodone-acetaminophen (NORCO/VICODIN) 5-325 MG per tablet Take 1-2 tablets by mouth every 4 (four) hours as needed for moderate pain. Patient not taking: Reported on 09/23/2014 08/11/14   Ladell Pier, MD  megestrol (MEGACE) 40 MG/ML suspension Take 5 mLs (200 mg total) by mouth 2 (two) times daily. Patient not taking: Reported on 10/05/2014 10/05/14   Ladell Pier, MD   Vital Signs: BP 110/69 mmHg  Pulse 76  Temp(Src) 98.1 F (36.7 C) (Oral)  Resp 24  Ht 6\' 1"  (1.854 m)  Wt 179 lb 3.7 oz (81.3 kg)  BMI 23.65 kg/m2  SpO2 95%  Physical Exam General: A&Ox3, NAD Abd: Soft, NT, RLQ perc drain intact 120cc/24hrs serous output in bag  Imaging: Dg Chest 2 View  10/05/2014   CLINICAL DATA:  Fever and shortness of breath beginning today. History of colon carcinoma.  EXAM: CHEST  2 VIEW  COMPARISON:  Single view of the chest 08/18/2014.  FINDINGS: Port-A-Cath is again seen. There are small to moderate bilateral pleural effusions, greater on the right. Basilar airspace disease is also worse on the right. No pneumothorax is identified.  Heart size is normal.  IMPRESSION: Right greater than left small to moderate bilateral pleural effusions with associated basilar airspace disease, likely atelectasis.   Electronically Signed   By: Inge Rise M.D.   On: 10/05/2014 14:28   Ct Angio Chest Pe W/cm &/or Wo Cm  10/05/2014   CLINICAL DATA:  Acute onset of generalized abdominal pain and weakness. Hypotension. Diarrhea. Hypoxia. Significantly elevated D-dimer. Current history of adenocarcinoma with carcinomatosis, status post chemotherapy. Initial encounter.  EXAM: CT ANGIOGRAPHY CHEST  CT ABDOMEN AND PELVIS WITH CONTRAST  TECHNIQUE: Multidetector CT imaging of the chest was performed using the standard protocol during bolus administration of intravenous contrast. Multiplanar CT image reconstructions and MIPs were obtained to evaluate the vascular anatomy. Multidetector CT imaging of the abdomen and pelvis was performed using the standard protocol during bolus administration of intravenous contrast.  CONTRAST:  150mL OMNIPAQUE IOHEXOL 350 MG/ML SOLN  COMPARISON:  Abdominal radiographs performed earlier today at 2:12 p.m., and CT of the abdomen and pelvis performed 09/08/2014. CT of the chest performed 03/16/2014  FINDINGS: CTA CHEST FINDINGS  There is no evidence of pulmonary embolus.  Moderate bilateral pleural effusions are seen, with underlying partial consolidation of both lower lung lobes. The expanded portions of both lungs appear relatively clear. There is no evidence of pneumothorax. No masses are identified; no abnormal focal contrast enhancement is seen.  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 The great vessels are grossly unremarkable in appearance. No pericardial effusion is identified. No definite mediastinal lymphadenopathy is seen.  Scattered coronary artery calcifications are noted. There is suggestion of mild diffuse wall thickening along  the esophagus, with associated edema, raising concern for some degree of esophagitis. No axillary lymphadenopathy is seen. The thyroid gland is unremarkable in appearance. A right-sided chest port is noted ending about the distal SVC.  No acute osseous abnormalities are seen.  CT ABDOMEN and PELVIS FINDINGS  There appears to be a very large peripherally enhancing evolving abscess at the right side of the abdomen, partially contiguous with additional collections of fluid throughout the remainder of the abdomen and pelvis. This measures approximately 30.9 x 12.2 x 20.0 cm, extending from the level of the liver down into the pelvis. This contains a large amount of fluid and air. Given that the surgery was a month ago, this is unlikely to reflect residual air. There is no evidence of extravasation of contrast into the collection to suggest a perforated viscus, though it cannot be excluded; contrast appears to extend normally through the visualized bowel anastomoses and into the sigmoid colon. A gas-producing organism is thought to be the most likely etiology of the air.  Underlying moderate to large volume ascites is noted within the abdomen and pelvis; the underlying ascites has lower attenuation than the fluid within the abscess.  There appears to be prominence of  intrahepatic biliary ducts and underlying periportal edema, though the common bile duct is not definitely enlarged. This is of uncertain significance. 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 The spleen is unremarkable in appearance. The superior aspect of the abscess demonstrates some degree of mass effect on the inferior aspect of the liver. A likely small stone is noted along the lateral gallbladder wall. The gallbladder is otherwise grossly unremarkable. The pancreas and adrenal glands are unremarkable.  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 The stomach is grossly unremarkable in appearance, though difficult to fully assess at  the level of the hepatic hilum.  A 2.2 cm cyst is noted at the upper pole of the left kidney. The kidneys are otherwise unremarkable. There is no evidence of hydronephrosis. No renal or ureteral stones are seen. No perinephric stranding is appreciated.  There is vague diffuse persistent or recurrent edema involving multiple small bowel loops at the mid and lower abdomen. There is also diffuse nodular mucosal thickening along the remaining ileum extending to the level of the patient's ileocolic anastomosis, and more mild mucosal edema along the distal descending and sigmoid colon. Some of these bowel loops are largely surrounded by the abscess. This may reflect lymphatic congestion, ischemia or infection. Underlying tumor infiltration cannot be excluded, as previously noted. The edematous bowel loops still demonstrate minimal enhancement, without evidence for dead bowel at this time.  No acute vascular abnormalities are seen. Mild scattered calcification is noted along the abdominal aorta and its branches.  The bladder is mildly distended and grossly unremarkable in appearance. The prostate is enlarged, measuring 5.5 cm in transverse dimension. No inguinal lymphadenopathy is seen.  No acute osseous abnormalities are identified.  Review of the MIP images confirms the above findings.  IMPRESSION: 1. Very large peripherally enhancing evolving abscess at the right side of the abdomen, partially contiguous with additional collections of fluid throughout the remainder of the abdomen and pelvis. This measures 30.9 x 12.2 x 20.0 cm, extending from the level of the liver down into the pelvis, and contains a large amount of fluid and air. No evidence of extravasation of contrast into the collection to suggest a perforated viscus, though it cannot be excluded. Contrast appears to extend normally through the visualized bowel anastomoses. A gas-producing organism is thought to be the most likely etiology of the air. Given the  size of the abscess and surrounding ascites, this is likely too large for successful interventional drainage. Surgical consultation is suggested, as deemed clinically appropriate. 2. Underlying moderate to large volume ascites within the abdomen and pelvis. 3. Vague persistent or recurrent diffuse edema involving multiple small bowel loops at the mid and lower abdomen. Diffuse nodular mucosal thickening along the remaining ileum extending to the ileocolic anastomosis, and more mild mucosal edema along the distal descending and sigmoid colon. This may reflect lymphatic congestion, ischemia or infection. As previously noted, underlying tumor infiltration cannot be excluded. The edematous bowel loops still demonstrate minimal enhancement, without definite dead bowel at this time. 4. No evidence of pulmonary embolus. 5. Moderate bilateral pleural effusions, with underlying partial consolidation of both lower lung lobes. 6. Suggestion of mild diffuse wall thickening along the esophagus, with associated edema, raising concern for some degree of esophagitis. 7. Prominence of the intrahepatic biliary ducts and underlying periportal edema, somewhat more prominent than on the prior study, though the common bile duct is not definitely enlarged. This is of uncertain significance, though it could reflect some degree of obstruction  at the hepatic hilum. Underlying cavernous transformation of the portal vein noted. 8. 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 9. 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 If and when deemed clinically appropriate, would recommend semi-annual imaging followup by CTA or MRA, with elective referral to cardiothoracic surgery if not already obtained. This recommendation follows 2010 ACCF/AHA/AATS/ACR/ASA/SCA/SCAI/SIR/STS/SVM Guidelines for the Diagnosis and Management of Patients With Thoracic Aortic Disease. Circulation. 2010; 121: Z610-R604 10. Scattered  coronary artery calcifications seen. 11. Cholelithiasis; gallbladder otherwise unremarkable. 12. Small left renal cyst noted. 13. Mild scattered calcification along the abdominal aorta and its branches. 14. Enlarged prostate noted.  These results were called by telephone at the time of interpretation on 10/05/2014 at 6:50 pm to Dr. Hosie Poisson, who verbally acknowledged these results.   Electronically Signed   By: Garald Balding M.D.   On: 10/05/2014 19:11   Ct Abdomen Pelvis W Contrast  10/05/2014   CLINICAL DATA:  Acute onset of generalized abdominal pain and weakness. Hypotension. Diarrhea. Hypoxia. Significantly elevated D-dimer. Current history of adenocarcinoma with carcinomatosis, status post chemotherapy. Initial encounter.  EXAM: CT ANGIOGRAPHY CHEST  CT ABDOMEN AND PELVIS WITH CONTRAST  TECHNIQUE: Multidetector CT imaging of the chest was performed using the standard protocol during bolus administration of intravenous contrast. Multiplanar CT image reconstructions and MIPs were obtained to evaluate the vascular anatomy. Multidetector CT imaging of the abdomen and pelvis was performed using the standard protocol during bolus administration of intravenous contrast.  CONTRAST:  140mL OMNIPAQUE IOHEXOL 350 MG/ML SOLN  COMPARISON:  Abdominal radiographs performed earlier today at 2:12 p.m., and CT of the abdomen and pelvis performed 09/08/2014. CT of the chest performed 03/16/2014  FINDINGS: CTA CHEST FINDINGS  There is no evidence of pulmonary embolus.  Moderate bilateral pleural effusions are seen, with underlying partial consolidation of both lower lung lobes. The expanded portions of both lungs appear relatively clear. There is no evidence of pneumothorax. No masses are identified; no abnormal focal contrast enhancement is seen.  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 The great vessels are grossly unremarkable  in appearance. No pericardial effusion is identified. No definite mediastinal lymphadenopathy is seen.  Scattered coronary artery calcifications are noted. There is suggestion of mild diffuse wall thickening along the esophagus, with associated edema, raising concern for some degree of esophagitis. No axillary lymphadenopathy is seen. The thyroid gland is unremarkable in appearance. A right-sided chest port is noted ending about the distal SVC.  No acute osseous abnormalities are seen.  CT ABDOMEN and PELVIS FINDINGS  There appears to be a very large peripherally enhancing evolving abscess at the right side of the abdomen, partially contiguous with additional collections of fluid throughout the remainder of the abdomen and pelvis. This measures approximately 30.9 x 12.2 x 20.0 cm, extending from the level of the liver down into the pelvis. This contains a large amount of fluid and air. Given that the surgery was a month ago, this is unlikely to reflect residual air. There is no evidence of extravasation of contrast into the collection to suggest a perforated viscus, though it cannot be excluded; contrast appears to extend normally through the visualized bowel anastomoses and into the sigmoid colon. A gas-producing organism is thought to be the most likely etiology of the air.  Underlying moderate to large volume ascites is noted within the abdomen and pelvis; the underlying  ascites has lower attenuation than the fluid within the abscess.  There appears to be prominence of intrahepatic biliary ducts and underlying periportal edema, though the common bile duct is not definitely enlarged. This is of uncertain significance. 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 The spleen is unremarkable in appearance. The superior aspect of the abscess demonstrates some degree of mass effect on the inferior aspect of the liver. A likely small stone is noted along the lateral gallbladder wall. The gallbladder  is otherwise grossly unremarkable. The pancreas and adrenal glands are unremarkable.  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 The stomach is grossly unremarkable in appearance, though difficult to fully assess at the level of the hepatic hilum.  A 2.2 cm cyst is noted at the upper pole of the left kidney. The kidneys are otherwise unremarkable. There is no evidence of hydronephrosis. No renal or ureteral stones are seen. No perinephric stranding is appreciated.  There is vague diffuse persistent or recurrent edema involving multiple small bowel loops at the mid and lower abdomen. There is also diffuse nodular mucosal thickening along the remaining ileum extending to the level of the patient's ileocolic anastomosis, and more mild mucosal edema along the distal descending and sigmoid colon. Some of these bowel loops are largely surrounded by the abscess. This may reflect lymphatic congestion, ischemia or infection. Underlying tumor infiltration cannot be excluded, as previously noted. The edematous bowel loops still demonstrate minimal enhancement, without evidence for dead bowel at this time.  No acute vascular abnormalities are seen. Mild scattered calcification is noted along the abdominal aorta and its branches.  The bladder is mildly distended and grossly unremarkable in appearance. The prostate is enlarged, measuring 5.5 cm in transverse dimension. No inguinal lymphadenopathy is seen.  No acute osseous abnormalities are identified.  Review of the MIP images confirms the above findings.  IMPRESSION: 1. Very large peripherally enhancing evolving abscess at the right side of the abdomen, partially contiguous with additional collections of fluid throughout the remainder of the abdomen and pelvis. This measures 30.9 x 12.2 x 20.0 cm, extending from the level of the liver down into the pelvis, and contains a large amount of fluid and air. No evidence of extravasation of contrast into the collection to suggest a  perforated viscus, though it cannot be excluded. Contrast appears to extend normally through the visualized bowel anastomoses. A gas-producing organism is thought to be the most likely etiology of the air. Given the size of the abscess and surrounding ascites, this is likely too large for successful interventional drainage. Surgical consultation is suggested, as deemed clinically appropriate. 2. Underlying moderate to large volume ascites within the abdomen and pelvis. 3. Vague persistent or recurrent diffuse edema involving multiple small bowel loops at the mid and lower abdomen. Diffuse nodular mucosal thickening along the remaining ileum extending to the ileocolic anastomosis, and more mild mucosal edema along the distal descending and sigmoid colon. This may reflect lymphatic congestion, ischemia or infection. As previously noted, underlying tumor infiltration cannot be excluded. The edematous bowel loops still demonstrate minimal enhancement, without definite dead bowel at this time. 4. No evidence of pulmonary embolus. 5. Moderate bilateral pleural effusions, with underlying partial consolidation of both lower lung lobes. 6. Suggestion of mild diffuse wall thickening along the esophagus, with associated edema, raising concern for some degree of esophagitis. 7. Prominence of the intrahepatic biliary ducts and underlying periportal edema, somewhat more prominent than on the prior study, though the common bile duct  is not definitely enlarged. This is of uncertain significance, though it could reflect some degree of obstruction at the hepatic hilum. Underlying cavernous transformation of the portal vein noted. 8. 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 9. 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 If and when deemed clinically appropriate, would recommend semi-annual imaging followup by CTA or MRA, with elective referral to cardiothoracic surgery if not already obtained.  This recommendation follows 2010 ACCF/AHA/AATS/ACR/ASA/SCA/SCAI/SIR/STS/SVM Guidelines for the Diagnosis and Management of Patients With Thoracic Aortic Disease. Circulation. 2010; 121: [id number] 10. Scattered coronary artery calcifications seen. 11. Cholelithiasis; gallbladder otherwise unremarkable. 12. Small left renal cyst noted. 13. Mild scattered calcification along the abdominal aorta and its branches. 14. Enlarged prostate noted.  These results were called by telephone at the time of interpretation on 10/05/2014 at 6:50 pm to Dr. Hosie Poisson, who verbally acknowledged these results.   Electronically Signed   By: Garald Balding M.D.   On: 10/05/2014 19:11   Dg Abd 2 Views  10/08/2014   CLINICAL DATA:  Recent small bowel obstruction, known abdominal carcinomatosis  EXAM: ABDOMEN - 2 VIEW  COMPARISON:  10/05/2014  FINDINGS: Scattered large and small bowel gas is noted. A drainage catheter is noted in the right mid abdomen. Bibasilar atelectatic changes with associated effusions are noted. No definitive free air is seen. No true obstructive changes are noted.  IMPRESSION: Bibasilar atelectasis and associated effusions similar to that seen on recent CT examination.  Right mid abdominal drainage catheter in satisfactory position.  No true obstructive changes are noted at this time.   Electronically Signed   By: Inez Catalina M.D.   On: 10/08/2014 15:34   Dg Abd 2 Views  10/05/2014   CLINICAL DATA:  Fever. Shortness of breath. Colostomy revision and bowel resection may 2016.  EXAM: ABDOMEN - 2 VIEW  COMPARISON:  09/08/2014  FINDINGS: Airspace opacities probably from pleural effusions of the lung bases.  Nondependent collection of gas in the right abdomen with air-fluid level on the left-side-down lateral decubitus views ; this does not track around the inferior liver edge, and I do not see regular lines on the frontal projection, accordingly this may be gas within right-sided bowel, but I am not absolutely  certain.  IMPRESSION: 1. Bibasilar pleural effusions with associated atelectasis/airspace opacity. 2. Air- fluid level non-dependently in the abdomen on the lateral decubitus view. The gas does not appear to track along the liver edge and accordingly is probably within dilated bowel, but I am not 811% certain that this is intraluminal. CT abdomen recommended. These results will be called to the ordering clinician or representative by the Radiologist Assistant, and communication documented in the PACS or zVision Dashboard.   Electronically Signed   By: Van Clines M.D.   On: 10/05/2014 14:34   Ct Image Guided Drainage Percut Cath  Peritoneal Retroperit  10/06/2014   CLINICAL DATA:  Abdominal carcinomatosis post laparotomy for bowel obstruction. Now presents with large intra-abdominal abscess.  EXAM: CT GUIDED DRAINAGE OF PERITONEAL ABSCESS  ANESTHESIA/SEDATION: Intravenous Versed were administered as conscious sedation during continuous cardiorespiratory monitoring by the ICU RN, with a total moderate sedation time of less than 30 minutes.  PROCEDURE: The procedure, risks, benefits, and alternatives were explained to the patient. Questions regarding the procedure were encouraged and answered. The patient understands and consents to the procedure.  Select axial scans through the abdomen were obtained. The collection was localized and an appropriate skin entry site  was determined and marked.  The operative field was prepped with Betadinein a sterile fashion, and a sterile drape was applied covering the operative field. A sterile gown and sterile gloves were used for the procedure. Local anesthesia was provided with 1% Lidocaine.  Under CT fluoroscopic guidance, a 19 gauge percutaneous entry needle was advanced into the collection. Purulent material returned. An Amplatz guidewire advanced easily into the collection. Tract dilated to allow placement of a 12 French pigtail catheter, placed within the central  aspect of the collection. Catheter position confirmed on CT. Catheter secured externally with 0 Prolene suture and StatLock and placed to gravity bag. 1700 mL of fluid were removed during the procedure. A sample sent for Gram stain, culture and sensitivity.  COMPLICATIONS: None immediate  FINDINGS: Limited CT confirms large gas and fluid loculated collection in the right peritoneal cavity. 12 French abscess drain catheter placed under CT guidance.  IMPRESSION: 1. Technically successful CT-guided peritoneal abscess drain catheter placement.   Electronically Signed   By: Lucrezia Europe M.D.   On: 10/06/2014 09:20    Labs:  CBC:  Recent Labs  10/06/14 0545 10/07/14 0537 10/08/14 0435 10/09/14 0440  WBC 15.1* 14.6* 11.5* 10.3  HGB 8.5* 8.2* 8.1* 7.8*  HCT 26.2* 25.8* 26.4* 25.1*  PLT 383 419* 402* 363    COAGS:  Recent Labs  07/14/14 1055 10/05/14 1942  INR 1.11 1.47  APTT 33 42*    BMP:  Recent Labs  10/06/14 0545 10/07/14 0537 10/08/14 0435 10/09/14 0440  NA 127* 128* 134* 131*  K 3.1* 3.9 4.5 4.6  CL 98* 103 106 103  CO2 21* 20* 22 23  GLUCOSE 116* 97 87 81  BUN 9 8 <5* <5*  CALCIUM 6.9* 6.7* 7.2* 7.2*  CREATININE 0.43* 0.54* 0.51* 0.56*  GFRNONAA >60 >60 >60 >60  GFRAA >60 >60 >60 >60    LIVER FUNCTION TESTS:  Recent Labs  09/17/14 0509 09/23/14 0829 10/05/14 0837 10/06/14 0545  BILITOT 1.2 1.08 0.72 0.5  AST 26 24 20 18   ALT 26 14 17  16*  ALKPHOS 128* 244* 165* 114  PROT 5.3* 5.9* 6.0* 5.1*  ALBUMIN 1.6* 1.2* 1.3* 1.2*    Assessment and Plan: 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 Excell Seltzer  Abdominal abscess S/p RLQ perc drain placed 10/06/14 Cx multi-organism, wbc wnl, afebrile, output 120cc/24hrs (75cc) Plans per [facility] Will need IR [facility] Radiology drain clinic F/U on Tuesday, 6/28 with repeat CT-no drain injection needed Our office will call patient to schedule time  [phone]   Signed: Hedy Jacob 10/09/2014, 11:25 AM   I spent a total of 15 Minutes in face to face in clinical consultation/evaluation, greater than 50% of which was counseling/coordinating care for abdominal abscess

## 2014-10-09 NOTE — Progress Notes (Signed)
[facility] PROGRESS NOTE  Wesley Dillon:[id number] DOB: June 30, 1945 DOA: 10/05/2014 PCP: Gerrit Heck, MD Interim summary:  Wesley Dillon is a 69 y.o. male with h/o adenocarcinoma carcinomatosis, CAD, anemia, hypertension, recently discharged from the hospital who presented with sudden onset fever, sob and generalized weakness.  He was found to have a large abdominal abscess and possible HCAP and severe sepsis.  He was admitted to step down and surgery consulted.  He did not require basal pressors. He was started on IV vancomycin and Zosyn and underwent interventional radiology percutaneous drain placement on 6/13. He was mildly hypotensive but his blood pressures improved with IV fluids and antibiotics and have remained stable.  Assessment/Plan:  Severe sepsis from abdominal abscess/ questionable HCAP/ Abdominal Wall cellulitis, resolving.  -  D/c IV vancomycin  -  continue IV zosyn day 5 -  BCx NGTD -  Wound culture:  Mixed flora, no staph or strep.  Lab unable to find sample to determine if pseudomonas was present -  Appreciate general surgery and interventional radiology assistance -  Plan to d/c on cipro and flagyl -  F/u next week with radiology on Tuesday for repeat CT scan, no drain injected needed.  [phone]  Abdominal carcinomatosis with recent ex lap with small bowel bypass and colon resection by Dr. Excell Seltzer in May 2016 -  Management per Dr Learta Codding.   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   Ascending aortic aneurysm, 4.8 cm on CT chest. Recommend follow-up with CT surgery as an outpatient depending on prognosis and treatment of his underlying malignancy. Currently the patient is too ill and infected to undergo surgical repair.  Asymmetric lower extremity edema -  Duplex negative, may have some lymphedema secondary to cancer  GERD, improved -  Continue carafate -  D/c H2 blocker -  Continue PPI  Essential tremor, stable -  Continue primidone  Sinus tachycardia, concerning for end-stage discharge but may also be due to holding beta blocker  -  Continue low dose BB  DIET:  Regular ACCESS:  Port IVF:  off PROPH:  lovenox and SCDs  Code Status: full code Family Communication:  Patient and wife Disposition Plan:  Plan to d/c tomorrow if erythema is improved on lower abdomen on cipro/flagyl   Consultants:  Cardiology  General Surgery  Interventional Radiology  Procedures: -  IR percutaneous drain placement for retroperitoneal abscess, 10/05/14 -  CT angio chest abd/pelvis on 6/13  Antibiotics: vancomycin 6/13 >> zosyn 6/13 >>  HPI/Subjective:  Eating well.  Ambulated down the hall.  Family concerned about residual redness over lower abdomen.  Receiving teaching about drain management.    Objective: Filed Vitals:   10/09/14 0551  BP: 110/69  Pulse: 76  Temp: 98.1 F (36.7 C)  Resp: 24    Intake/Output Summary (Last 24 hours) at 10/09/14 1155 Last data filed at 10/09/14 0624  Gross per 24 hour  Intake 3021.25 ml  Output   2445  ml  Net 576.25 ml   Filed Weights   10/05/14 1845 10/07/14 0400  Weight: 77.2 kg (170 lb 3.1 oz) 81.3 kg (179 lb 3.7 oz)    Exam:   General:  Adult male, lying in bed, no acute distress, cachectic, smiling and revved up  Cardiovascular:  Sinus tachycardia, normal s1s2, no murmurs, rubs, gallops    Respiratory: diminished at bases, no wheezing, rales, or rhonchi Abdomen:   NABS, healed craniocaudal midline incision in the lower abdomen with fewer splotchy areas of persistent pink erythema in the superior areas but persistent area of confluent erythema at the base of the incision tracking minimially laterally. His abdomen feels less warm.  Still firm to touch at the base.  Drain with mostly serous drainage Musculoskeletal:  Decreased muscle bulk and tone, 2+ left lower extremity edema and 1+ right lower extremity edema which the patient only states is stable.    Data Reviewed: Basic Metabolic Panel:  Recent Labs Lab 10/05/14 0837 10/05/14 1350 10/06/14 0545 10/07/14 0537 10/08/14 0435 10/09/14 0440  NA 127* 123* 127* 128* 134* 131*  K 3.4* 3.6 3.1* 3.9 4.5 4.6  CL  --  94* 98* 103 106 103  CO2 20*  --  21* 20* 22 23  GLUCOSE 97 95 116* 97 87 81  BUN 7.3 7 9 8  <5* <5*  CREATININE 0.6* 0.50* 0.43* 0.54* 0.51* 0.56*  CALCIUM 7.6*  --  6.9* 6.7* 7.2* 7.2*  MG  --   --  1.4* 1.8  --   --    Liver Function Tests:  Recent Labs Lab 10/05/14 0837 10/06/14 0545  AST 20 18  ALT 17 16*  ALKPHOS 165* 114  BILITOT 0.72 0.5  PROT 6.0* 5.1*  ALBUMIN 1.3* 1.2*   No results for input(s): LIPASE, AMYLASE in the last 168 hours. No results for input(s): AMMONIA in the last 168 hours. CBC:  Recent Labs Lab 10/05/14 0837 10/05/14 1350 10/06/14 0545 10/07/14 0537 10/08/14 0435 10/09/14 0440  WBC 20.3* 20.5* 15.1* 14.6* 11.5* 10.3  NEUTROABS 18.2* 19.1*  --   --   --   --   HGB 8.8* 8.6*  9.9* 8.5* 8.2* 8.1* 7.8*  HCT 27.2* 26.3*  29.0* 26.2* 25.8* 26.4* 25.1*  MCV 82.4 83.8 82.9 82.2 83.8 83.9  PLT 378 434* 383 419* 402* 363   Cardiac Enzymes: No results for input(s): CKTOTAL, CKMB, CKMBINDEX, TROPONINI in the last 168 hours. BNP (last 3 results)  Recent Labs  10/06/14 0545  BNP 574.2*    ProBNP (last 3 results) No results for input(s): PROBNP in the last 8760  hours.  CBG: No results for input(s): GLUCAP in the last 168 hours.  Recent Results (from the past 240 hour(s))  Blood culture (routine x 2)     Status: None (Preliminary result)   Collection Time: 10/05/14  1:53 PM  Result Value Ref Range Status   Specimen Description BLOOD LEFT ANTECUBITAL  Final   Special Requests BOTTLES DRAWN AEROBIC AND ANAEROBIC 5CC EACH  Final   Culture   Final           BLOOD CULTURE RECEIVED NO GROWTH TO DATE CULTURE WILL BE HELD FOR 5 DAYS BEFORE ISSUING A FINAL NEGATIVE REPORT Performed at [facility]    Report Status PENDING  Incomplete  Blood culture (routine x 2)     Status: None (Preliminary result)   Collection Time: 10/05/14  1:55 PM  Result Value Ref Range Status  Specimen Description BLOOD RIGHT ANTECUBITAL  Final   Special Requests BOTTLES DRAWN AEROBIC AND ANAEROBIC 5CC EACH  Final   Culture   Final           BLOOD CULTURE RECEIVED NO GROWTH TO DATE CULTURE WILL BE HELD FOR 5 DAYS BEFORE ISSUING A FINAL NEGATIVE REPORT Performed at [facility]    Report Status PENDING  Incomplete  Clostridium Difficile by PCR (not at [facility])     Status: None   Collection Time: 10/05/14  7:45 PM  Result Value Ref Range Status   C difficile by pcr NEGATIVE NEGATIVE Final  Culture, routine-abscess     Status: None   Collection Time: 10/05/14  9:57 PM  Result Value Ref Range Status   Specimen Description DRAINAGE  Final   Special Requests Normal  Final   Gram Stain   Final    FEW WBC PRESENT, PREDOMINANTLY PMN NO SQUAMOUS EPITHELIAL CELLS SEEN ABUNDANT GRAM NEGATIVE RODS ABUNDANT GRAM POSITIVE COCCI IN PAIRS FEW GRAM POSITIVE RODS    Culture   Final    MULTIPLE ORGANISMS PRESENT, NONE PREDOMINANT Note: NO STAPHYLOCOCCUS AUREUS ISOLATED NO GROUP A STREP (S.PYOGENES) ISOLATED Performed at [facility]    Report Status 10/09/2014 FINAL  Final     Studies: Dg Abd 2 Views  10/08/2014   CLINICAL DATA:  Recent small bowel  obstruction, known abdominal carcinomatosis  EXAM: ABDOMEN - 2 VIEW  COMPARISON:  10/05/2014  FINDINGS: Scattered large and small bowel gas is noted. A drainage catheter is noted in the right mid abdomen. Bibasilar atelectatic changes with associated effusions are noted. No definitive free air is seen. No true obstructive changes are noted.  IMPRESSION: Bibasilar atelectasis and associated effusions similar to that seen on recent CT examination.  Right mid abdominal drainage catheter in satisfactory position.  No true obstructive changes are noted at this time.   Electronically Signed   By: Inez Catalina M.D.   On: 10/08/2014 15:34    Scheduled Meds: . diphenoxylate-atropine  1 tablet Oral BID  . enoxaparin (LOVENOX) injection  40 mg Subcutaneous Q24H  . famotidine  20 mg Oral BID  . feeding supplement (ENSURE ENLIVE)  237 mL Oral BID BM  . lactose free nutrition  237 mL Oral BID BM  . megestrol  400 mg Oral Daily  . midazolam  1 mg Intravenous Once  . multivitamin with minerals  1 tablet Oral Daily  . pantoprazole  40 mg Oral BID  . piperacillin-tazobactam (ZOSYN)  IV  3.375 g Intravenous 3 times per day  . potassium chloride  20 mEq Oral TID PC  . primidone  250 mg Oral QHS  . propranolol  10 mg Oral BID  . saccharomyces boulardii  250 mg Oral BID  . sucralfate  1 g Oral TID AC & HS   Continuous Infusions: . 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

## 2014-10-09 NOTE — Care Management Note (Signed)
Case Management Note  Patient Details  Name: ISIDRO MONKS MRN: [id number] Date of Birth: 01-Jun-1945  Subjective/Objective: PT-HHPT. [facility] active-[facility] rep aware of HHPT order, following for d/c. ccs following-BM's, Drain, iv abx, ivf, reg diet.                  Action/Plan:d/c plan home w/HHPT.   Expected Discharge Date:   (unknown)               Expected Discharge Plan:  [place]  In-House Referral:     Discharge planning Services  CM Consult  Post Acute Care Choice:    Choice offered to:     DME Arranged:    DME Agency:  [place]:  PT [place]:  [place]  Status of Service:  In process, will continue to follow  Medicare Important Message Given:    Date Medicare IM Given:    Medicare IM give by:    Date Additional Medicare IM Given:    Additional Medicare Important Message give by:     If discussed at [place] of Stay Meetings, dates discussed:    Additional Comments:  Dessa Phi, RN 10/09/2014, 11:48 AM

## 2014-10-09 NOTE — Plan of Care (Signed)
Problem: Phase II Progression Outcomes Goal: Progress activity as tolerated unless otherwise ordered Outcome: Completed/Met Date Met:  10/09/14 Walking in hallways  Problem: Phase III Progression Outcomes Goal: Discharge plan remains appropriate-arrangements made Outcome: Completed/Met Date Met:  10/09/14 Home with [facility]

## 2014-10-09 NOTE — Progress Notes (Signed)
ANTIBIOTIC CONSULT NOTE - follow up  Pharmacy Consult for Vancomycin / Zosyn Indication: HCAP; abdominal wall cellulitis/abscess  No Known Allergies  Patient Measurements: Height: 6\' 1"  (185.4 cm) Weight: 179 lb 3.7 oz (81.3 kg) IBW/kg (Calculated) : 79.9 Adjusted Body Weight:   Vital Signs: Temp: 98.1 F (36.7 C) (06/17 0551) Temp Source: Oral (06/17 0551) BP: 110/69 mmHg (06/17 0551) Pulse Rate: 76 (06/17 0551) Intake/Output from previous day: 06/16 0701 - 06/17 0700 In: 3261.3 [P.O.:810; I.V.:1786.3; IV Piggyback:650] Out: 2545 [Urine:2425; Drains:120] Intake/Output from this shift:    Labs:  Recent Labs  10/07/14 0537 10/08/14 0435 10/09/14 0440  WBC 14.6* 11.5* 10.3  HGB 8.2* 8.1* 7.8*  PLT 419* 402* 363  CREATININE 0.54* 0.51* 0.56*   Estimated Creatinine Clearance: 101.3 mL/min (by C-G formula based on Cr of 0.56).  Recent Labs  10/07/14 1545  [place]     Microbiology: Recent Results (from the past 720 hour(s))  Clostridium Difficile by PCR     Status: None   Collection Time: 09/15/14  3:03 AM  Result Value Ref Range Status   C difficile by pcr NEGATIVE NEGATIVE Final  Stool culture     Status: None   Collection Time: 09/17/14 10:18 AM  Result Value Ref Range Status   Specimen Description STOOL  Final   Special Requests NONE  Final   Culture   Final    NO SALMONELLA, SHIGELLA, CAMPYLOBACTER, YERSINIA, OR E.COLI 0157:H7 ISOLATED Performed at [facility]    Report Status 09/21/2014 FINAL  Final  Clostridium Difficile by PCR     Status: None   Collection Time: 09/17/14 10:18 AM  Result Value Ref Range Status   C difficile by pcr NEGATIVE NEGATIVE Final  Blood culture (routine x 2)     Status: None (Preliminary result)   Collection Time: 10/05/14  1:53 PM  Result Value Ref Range Status   Specimen Description BLOOD LEFT ANTECUBITAL  Final   Special Requests BOTTLES DRAWN AEROBIC AND ANAEROBIC 5CC EACH  Final   Culture   Final           BLOOD CULTURE RECEIVED NO GROWTH TO DATE CULTURE WILL BE HELD FOR 5 DAYS BEFORE ISSUING A FINAL NEGATIVE REPORT Performed at [facility]    Report Status PENDING  Incomplete  Blood culture (routine x 2)     Status: None (Preliminary result)   Collection Time: 10/05/14  1:55 PM  Result Value Ref Range Status   Specimen Description BLOOD RIGHT ANTECUBITAL  Final   Special Requests BOTTLES DRAWN AEROBIC AND ANAEROBIC 5CC EACH  Final   Culture   Final           BLOOD CULTURE RECEIVED NO GROWTH TO DATE CULTURE WILL BE HELD FOR 5 DAYS BEFORE ISSUING A FINAL NEGATIVE REPORT Performed at [facility]    Report Status PENDING  Incomplete  Clostridium Difficile by PCR (not at [facility])     Status: None   Collection Time: 10/05/14  7:45 PM  Result Value Ref Range Status   C difficile by pcr NEGATIVE NEGATIVE Final  Culture, routine-abscess     Status: None   Collection Time: 10/05/14  9:57 PM  Result Value Ref Range Status   Specimen Description DRAINAGE  Final   Special Requests Normal  Final   Gram Stain   Final    FEW WBC PRESENT, PREDOMINANTLY PMN NO SQUAMOUS EPITHELIAL CELLS SEEN ABUNDANT GRAM NEGATIVE RODS ABUNDANT GRAM POSITIVE COCCI IN PAIRS  FEW GRAM POSITIVE RODS    Culture   Final    MULTIPLE ORGANISMS PRESENT, NONE PREDOMINANT Note: NO STAPHYLOCOCCUS AUREUS ISOLATED NO GROUP A STREP (S.PYOGENES) ISOLATED Performed at [facility]    Report Status 10/09/2014 FINAL  Final    Medical History: Past Medical History  Diagnosis Date  . Tremor     takes Primidone 250 once daily and inderall 160 daily for this-has had it since he was 21  . Pneumonia 06/2012  . Macular degeneration   . Benign essential tremor   . Coronary artery disease   . Anemia 06/30/2014  . 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 Essential hypertension 08/18/2014   Assessment: 69 y.o M with history of metastatic carcinoma with abdominal carcinomatosis (s/p resection,  colectomy, and small bowel bypass on 09/09/14) and recent hospitalization for SBO . S/p IR perc drain placement for retroperitoneal ascites/abscess on 6/13. He is currently on abx day #4 for HCAP and abscess/cellulitis.  6/13 >> Vanc  >> 6/13 >> Zosyn  >>    Temp: afebrile since 6/14 WBC: down wnl Renal: SCr wnl; CrCl ~100 PCT elevated x 1; LA resolved  Dose changes/levels: 6/15 1545 VT = 13 on 1g q12h, inc to 1250mg  q12h  6/13 Blood x2: NGTD 6/13 CDiff: NEG 6/13:abscss: abundant GNR & GPR >> multiple orgs, none predom FINAL     Goal of Therapy:  Vancomycin trough level 15-20 mcg/ml  Eradication of infection  Plan:  - cont zosyn EI - cont Vancomycin to 1250mg  IV q12h (will hold off on rechecking level for now since note indicates may change to PO abx soon) - continue lovenox 40mg  SQ q24h for VTE px.  Current dose is appropriate for renal function. Pharmacy will sign off for lovenox.

## 2014-10-10 DIAGNOSIS — R6520 Severe sepsis without septic shock: Secondary | ICD-10-CM

## 2014-10-10 DIAGNOSIS — A419 Sepsis, unspecified organism: Secondary | ICD-10-CM

## 2014-10-10 DIAGNOSIS — R778 Other specified abnormalities of plasma proteins: Secondary | ICD-10-CM | POA: Insufficient documentation

## 2014-10-10 DIAGNOSIS — R7989 Other specified abnormal findings of blood chemistry: Secondary | ICD-10-CM

## 2014-10-10 LAB — CBC
HCT: 25.4 % — ABNORMAL LOW (ref 39.0–52.0)
HEMOGLOBIN: 8.2 g/dL — AB (ref 13.0–17.0)
MCH: 27.3 pg (ref 26.0–34.0)
MCHC: 32.3 g/dL (ref 30.0–36.0)
MCV: 84.7 fL (ref 78.0–100.0)
PLATELETS: 474 10*3/uL — AB (ref 150–400)
RBC: 3 MIL/uL — AB (ref 4.22–5.81)
RDW: 18.2 % — AB (ref 11.5–15.5)
WBC: 11.4 10*3/uL — ABNORMAL HIGH (ref 4.0–10.5)

## 2014-10-10 LAB — BASIC METABOLIC PANEL
Anion gap: 6 (ref 5–15)
BUN: 6 mg/dL (ref 6–20)
CO2: 22 mmol/L (ref 22–32)
Calcium: 7.2 mg/dL — ABNORMAL LOW (ref 8.9–10.3)
Chloride: 103 mmol/L (ref 101–111)
Creatinine, Ser: 0.6 mg/dL — ABNORMAL LOW (ref 0.61–1.24)
GFR calc Af Amer: >60 mL/min (ref >60)
GLUCOSE: 85 mg/dL (ref 65–99)
POTASSIUM: 5.3 mmol/L — AB (ref 3.5–5.1)
SODIUM: 131 mmol/L — AB (ref 135–145)

## 2014-10-10 MED ORDER — HEPARIN SOD (PORK) LOCK FLUSH 100 UNIT/ML IV SOLN
500.0000 [IU] | INTRAVENOUS | Status: AC | PRN
  Administered 2014-10-10: 500 [IU]

## 2014-10-10 NOTE — Discharge Summary (Addendum)
Physician Discharge Summary  Wesley Dillon:[id number] DOB: December 30, 1945 DOA: 10/05/2014  PCP: Gerrit Heck, MD  Admit date: 10/05/2014 Discharge date: 10/10/2014  Recommendations for Outpatient Follow-up:  1.  Continue drain flushed three times a day with 5-8mL of saline and daily dressing changes.  Empty drain once daily and record output 2.  Follow up with Dr. [name] in as soon as possible, family working on scheduling 3.  Follow up with Dr. Excell Seltzer next week 4.  Follow up on Tuesday 6/28 at IR [facility] Radiology drain clinic for repeat CT scan.  [phone] 5.  Continue ciprofloxacin and flagyl until follow up at drain clinic.  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 male with h/o adenocarcinoma carcinomatosis, CAD, anemia, hypertension, recently discharged from the hospital who presented with sudden onset fever, sob and generalized weakness. He was found to have a large abdominal abscess and possible HCAP and severe sepsis. He was admitted to step down and general  surgery consulted. He did not require vasopressors but responded to IVF and antibitoics. He initially was started on IV vancomycin and Zosyn and underwent interventional radiology percutaneous drain placement on 6/13 with removal of over a liter of purulent fluid.  His fluid culture grew mixed flora without GAS or Staph aureus.  I was unable to add on testing for pseudomonas.  His white blood cell count trended down and he remained afebrile.  His antibiotics were narrowed to zosyn in-hospital and he will be discharged on cipro-flagyl, in part to cover pseudomonas which he is at risk for and, in part,  since he has had long courses of antibiotics previously, to reduce the risk of C. Diff diarrhea compared to giving Augmentin.    Hospital Course by Problem:   Severe sepsis from abdominal abscess/ possible HCAP/ Abdominal Wall cellulitis, resolving.  -  Improved on vancomycin and zosyn and transitioning to cipro/flagyl at discharge - BCx NGTD - Wound culture: Mixed flora, no staph or strep. Lab unable to find sample to determine if pseudomonas was present - Appreciate general surgery and interventional radiology assistance - F/u next week with radiology on Tuesday for repeat CT scan, no drain injected needed. [phone]  Abdominal carcinomatosis with recent ex lap with small bowel bypass and colon resection by Dr. Excell Seltzer in May 2016 - Management per Dr Learta Codding.   Severe protein calorie malnutrition.  Appreciated nutrition assistance.  He was given a regular diet with supplements.  Demand ischemia with mildly elevated troponin secondary to sepsis.  Cardiology was consulted and recommended against further work up at  this time.  Hyponatremia, resolving with IV fluids  Hypokalemia, resolved with IV and oral repletion.  Had mild hyperkalemia at time of discharge which will be helpful to him given his malnutrition and risk for recurrent hypokalemia from chronic diarrhea.  Leukocytosis due  to sepsis, and resolved with antibiotics  Anemia of chronic disease, hemoglobin drifted down and stabilized around 8.2mg /dl.  He did not require blood transfusion.  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   Ascending aortic aneurysm, 4.8 cm on CT chest. Recommend follow-up with CT surgery as an outpatient depending on prognosis and treatment of his underlying malignancy. Currently the patient is too ill and infected to undergo surgical repair.  Asymmetric lower extremity edema due to hypoalbuminemia (albumin 1.2)/anasarca - Duplex negative  GERD, improved with carafate and maalox.  Continue PPI  Essential tremor, stable.  Continued primidone  Sinus tachycardia, was likely secondary to holding beta blocker in the setting of severe sepsis and resolved after BB was resumed.   Consultants:  Cardiology  General Surgery  Interventional Radiology  Procedures: - IR percutaneous drain placement for retroperitoneal abscess, 10/05/14 - CT angio chest abd/pelvis on 6/13  Antibiotics: vancomycin 6/13 >> 6/17 zosyn 6/13 >>  Discharge Exam: Filed Vitals:   10/10/14 0646  BP: 111/65  Pulse: 77  Temp: 98.2 F (36.8 C)  Resp: 18   Filed Vitals:   10/09/14 0551 10/09/14 1443 10/09/14 2105 10/10/14 0646  BP: 110/69 102/80 110/68 111/65  Pulse: 76 81 88 77  Temp: 98.1 F (36.7 C) 98.1 F (36.7 C) 98.6 F (37 C) 98.2 F (36.8 C)  TempSrc: Oral Oral Oral Oral  Resp: 24 20 18 18   Height:      Weight:      SpO2: 95% 99% 94% 94%     General: Adult male, lying in bed, no acute distress, cachectic, smiling and revved up  Cardiovascular: RRR, normal s1s2, no murmurs, rubs, gallops   Respiratory: diminished at bases, no wheezing, rales, or rhonchi Abdomen: NABS, healed craniocaudal midline incision in the lower abdomen with resolved erythema in the superior areas but persistent area of confluent erythema at the base of the incision  tracking minimially laterally. His abdomen feels less warm. Still firm to touch at the base. Drain with serous drainage today. Musculoskeletal: Decreased muscle bulk and tone, 2+ left lower extremity edema and 1+ right lower extremity edema which the patient only states is stable.   Discharge Instructions      Discharge Instructions    Call MD for:  difficulty breathing, headache or visual disturbances    Complete by:  As directed      Call MD for:  extreme fatigue    Complete by:  As directed      Call MD for:  hives    Complete by:  As directed      Call MD for:  persistant dizziness or light-headedness    Complete by:  As directed      Call MD for:  persistant nausea and vomiting    Complete by:  As directed      Call MD for:  redness, tenderness, or signs of infection (pain, swelling, redness, odor or green/yellow discharge around incision site)    Complete by:  As directed      Call MD for:  severe uncontrolled pain    Complete by:  As directed      Call MD for:  temperature >100.4  Complete by:  As directed      Diet general    Complete by:  As directed      Discharge instructions    Complete by:  As directed   Please continue to monitor drain output once daily.  Flush with normal saline as instructed by nursing staff.  Continue ciprofloxacin twice daily and flagyl three times daily until told to stop by General Surgery or Radiology.  Length of course will be determined by CT scan at follow up appointment.  If you have worsening pain, swelling, fevers, chills, vomiting, diarrhea, dehydration, or any other concerning symptoms, please seek immediate medical attention or call 911.     Increase activity slowly    Complete by:  As directed             Medication List    STOP taking these medications        doxycycline 100 MG tablet  Commonly known as:  VIBRA-TABS     loperamide 2 MG capsule  Commonly known as:  IMODIUM      TAKE these medications         acetaminophen 500 MG tablet  Commonly known as:  TYLENOL  Take 1,000 mg by mouth every 6 (six) hours as needed for moderate pain or headache.     ciprofloxacin 500 MG tablet  Commonly known as:  CIPRO  Take 1 tablet (500 mg total) by mouth 2 (two) times daily.     diphenoxylate-atropine 2.5-0.025 MG per tablet  Commonly known as:  LOMOTIL  Take 1 tablet by mouth 4 (four) times daily as needed for diarrhea or loose stools.     fluconazole 100 MG tablet  Commonly known as:  DIFLUCAN  Take 1 tablet (100 mg total) by mouth daily.     HYDROcodone-acetaminophen 5-325 MG per tablet  Commonly known as:  NORCO/VICODIN  Take 1-2 tablets by mouth every 4 (four) hours as needed for moderate pain.     ibuprofen 200 MG tablet  Commonly known as:  ADVIL,MOTRIN  Take 800 mg by mouth every 6 (six) hours as needed for headache or moderate pain.     ipratropium 0.06 % nasal spray  Commonly known as:  ATROVENT  Place 2 sprays into both nostrils every 6 (six) hours as needed for rhinitis.     lidocaine-prilocaine cream  Commonly known as:  EMLA  Apply 1 application topically as needed. Apply to PAC 1-2 hours prior to stick and cover with plastic wrap     megestrol 40 MG/ML suspension  Commonly known as:  MEGACE  Take 10 mLs (400 mg total) by mouth daily.     metroNIDAZOLE 500 MG tablet  Commonly known as:  FLAGYL  Take 1 tablet (500 mg total) by mouth 3 (three) times daily.     ondansetron 4 MG disintegrating tablet  Commonly known as:  ZOFRAN ODT  Take 1 tablet (4 mg total) by mouth every 4 (four) hours as needed for nausea or vomiting.     oxyCODONE 5 MG immediate release tablet  Commonly known as:  Oxy IR/ROXICODONE  Take 1-2 tablets (5-10 mg total) by mouth every 4 (four) hours as needed for severe pain.     pantoprazole 40 MG tablet  Commonly known as:  PROTONIX  Take 1 tablet (40 mg total) by mouth 2 (two) times daily.     PRESCRIPTION MEDICATION  Chemo [facility]     PRESERVISION  AREDS PO  Take 2 capsules by mouth  every morning.     primidone 250 MG tablet  Commonly known as:  MYSOLINE  Take 1 tablet (250 mg total) by mouth at bedtime.     prochlorperazine 10 MG tablet  Commonly known as:  COMPAZINE  Take 1 tablet (10 mg total) by mouth every 6 (six) hours as needed for nausea.     propranolol 10 MG tablet  Commonly known as:  INDERAL  Take 1 tablet (10 mg total) by mouth 2 (two) times daily.     ranitidine 150 MG tablet  Commonly known as:  ZANTAC  Take 150 mg by mouth 2 (two) times daily.     saccharomyces boulardii 250 MG capsule  Commonly known as:  FLORASTOR  Take 1 capsule (250 mg total) by mouth 2 (two) times daily.     sodium chloride 0.9 % injection  Inject 10 mLs into the vein daily.     sucralfate 1 G tablet  Commonly known as:  CARAFATE  Take 1 tablet (1 g total) by mouth 4 (four) times daily -  with meals and at bedtime. Chew prior to swallowing       Follow-up Information    Follow up with Edward Jolly, MD. Go on 10/30/2014.   Specialty:  General Surgery   Why:  For post-hospital follow up appointment July 8th at 11:30am   Contact information:   [address] [place] [address] [phone]       Follow up with [facility] On 10/20/2014.   Specialty:  Radiology   Why:  FOR DRAIN CLINIC APPOINTMENT, CALL TO CONFIRM TIME., For post-hospital follow up   Contact information:   [address] [phone] [place]      Follow up with [facility].   Why:  [facility]   Contact information:   [address] [place] [address] [phone]        The results of significant diagnostics from this hospitalization (including imaging, microbiology, ancillary and laboratory) are listed below for reference.    Significant Diagnostic Studies: Dg Chest 2 View  10/05/2014   CLINICAL DATA:  Fever and  shortness of breath beginning today. History of colon carcinoma.  EXAM: CHEST  2 VIEW  COMPARISON:  Single view of the chest 08/18/2014.  FINDINGS: Port-A-Cath is again seen. There are small to moderate bilateral pleural effusions, greater on the right. Basilar airspace disease is also worse on the right. No pneumothorax is identified. Heart size is normal.  IMPRESSION: Right greater than left small to moderate bilateral pleural effusions with associated basilar airspace disease, likely atelectasis.   Electronically Signed   By: Inge Rise M.D.   On: 10/05/2014 14:28   Ct Angio Chest Pe W/cm &/or Wo Cm  10/05/2014   CLINICAL DATA:  Acute onset of generalized abdominal pain and weakness. Hypotension. Diarrhea. Hypoxia. Significantly elevated D-dimer. Current history of adenocarcinoma with carcinomatosis, status post chemotherapy. Initial encounter.  EXAM: CT ANGIOGRAPHY CHEST  CT ABDOMEN AND PELVIS WITH CONTRAST  TECHNIQUE: Multidetector CT imaging of the chest was performed using the standard protocol during bolus administration of intravenous contrast. Multiplanar CT image reconstructions and MIPs were obtained to evaluate the vascular anatomy. Multidetector CT imaging of the abdomen and pelvis was performed using the standard protocol during bolus administration of intravenous contrast.  CONTRAST:  126mL OMNIPAQUE IOHEXOL 350 MG/ML SOLN  COMPARISON:  Abdominal radiographs performed earlier today at 2:12 p.m., and CT  of the abdomen and pelvis performed 09/08/2014. CT of the chest performed 03/16/2014  FINDINGS: CTA CHEST FINDINGS  There is no evidence of pulmonary embolus.  Moderate bilateral pleural effusions are seen, with underlying partial consolidation of both lower lung lobes. The expanded portions of both lungs appear relatively clear. There is no evidence of pneumothorax. No masses are identified; no abnormal focal contrast enhancement is seen.  There is aneurysmal dilatation of the ascending  thoracic aorta to 4.8 cm in AP dimension, resolving at the level of the aortic arch. The great vessels are grossly unremarkable in appearance. No pericardial effusion is identified. No definite mediastinal lymphadenopathy is seen.  Scattered coronary artery calcifications are noted. There is suggestion of mild diffuse wall thickening along the esophagus, with associated edema, raising concern for some degree of esophagitis. No axillary lymphadenopathy is seen. The thyroid gland is unremarkable in appearance. A right-sided chest port is noted ending about the distal SVC.  No acute osseous abnormalities are seen.  CT ABDOMEN and PELVIS FINDINGS  There appears to be a very large peripherally enhancing evolving abscess at the right side of the abdomen, partially contiguous with additional collections of fluid throughout the remainder of the abdomen and pelvis. This measures approximately 30.9 x 12.2 x 20.0 cm, extending from the level of the liver down into the pelvis. This contains a large amount of fluid and air. Given that the surgery was a month ago, this is unlikely to reflect residual air. There is no evidence of extravasation of contrast into the collection to suggest a perforated viscus, though it cannot be excluded; contrast appears to extend normally through the visualized bowel anastomoses and into the sigmoid colon. A gas-producing organism is thought to be the most likely etiology of the air.  Underlying moderate to large volume ascites is noted within the abdomen and pelvis; the underlying ascites has lower attenuation than the fluid within the abscess.  There appears to be prominence of intrahepatic biliary ducts and underlying periportal edema, though the common bile duct is not definitely enlarged. This is of uncertain significance. 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 The spleen is unremarkable in appearance. The superior aspect of the abscess demonstrates some degree of  mass effect on the inferior aspect of the liver. A likely small stone is noted along the lateral gallbladder wall. The gallbladder is otherwise grossly unremarkable. The pancreas and adrenal glands are unremarkable.  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 The stomach is grossly unremarkable in appearance, though difficult to fully assess at the level of the hepatic hilum.  A 2.2 cm cyst is noted at the upper pole of the left kidney. The kidneys are otherwise unremarkable. There is no evidence of hydronephrosis. No renal or ureteral stones are seen. No perinephric stranding is appreciated.  There is vague diffuse persistent or recurrent edema involving multiple small bowel loops at the mid and lower abdomen. There is also diffuse nodular mucosal thickening along the remaining ileum extending to the level of the patient's ileocolic anastomosis, and more mild mucosal edema along the distal descending and sigmoid colon. Some of these bowel loops are largely surrounded by the abscess. This may reflect lymphatic congestion, ischemia or infection. Underlying tumor infiltration cannot be excluded, as previously noted. The edematous bowel loops still demonstrate minimal enhancement, without evidence for dead bowel at this time.  No acute vascular abnormalities are seen. Mild scattered calcification is noted along the abdominal aorta and its branches.  The bladder  is mildly distended and grossly unremarkable in appearance. The prostate is enlarged, measuring 5.5 cm in transverse dimension. No inguinal lymphadenopathy is seen.  No acute osseous abnormalities are identified.  Review of the MIP images confirms the above findings.  IMPRESSION: 1. Very large peripherally enhancing evolving abscess at the right side of the abdomen, partially contiguous with additional collections of fluid throughout the remainder of the abdomen and pelvis. This measures 30.9 x 12.2 x 20.0 cm, extending from the level of the liver down into the  pelvis, and contains a large amount of fluid and air. No evidence of extravasation of contrast into the collection to suggest a perforated viscus, though it cannot be excluded. Contrast appears to extend normally through the visualized bowel anastomoses. A gas-producing organism is thought to be the most likely etiology of the air. Given the size of the abscess and surrounding ascites, this is likely too large for successful interventional drainage. Surgical consultation is suggested, as deemed clinically appropriate. 2. Underlying moderate to large volume ascites within the abdomen and pelvis. 3. Vague persistent or recurrent diffuse edema involving multiple small bowel loops at the mid and lower abdomen. Diffuse nodular mucosal thickening along the remaining ileum extending to the ileocolic anastomosis, and more mild mucosal edema along the distal descending and sigmoid colon. This may reflect lymphatic congestion, ischemia or infection. As previously noted, underlying tumor infiltration cannot be excluded. The edematous bowel loops still demonstrate minimal enhancement, without definite dead bowel at this time. 4. No evidence of pulmonary embolus. 5. Moderate bilateral pleural effusions, with underlying partial consolidation of both lower lung lobes. 6. Suggestion of mild diffuse wall thickening along the esophagus, with associated edema, raising concern for some degree of esophagitis. 7. Prominence of the intrahepatic biliary ducts and underlying periportal edema, somewhat more prominent than on the prior study, though the common bile duct is not definitely enlarged. This is of uncertain significance, though it could reflect some degree of obstruction at the hepatic hilum. Underlying cavernous transformation of the portal vein noted. 8. 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 9. 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 If and when deemed clinically appropriate, would  recommend semi-annual imaging followup by CTA or MRA, with elective referral to cardiothoracic surgery if not already obtained. This recommendation follows 2010 ACCF/AHA/AATS/ACR/ASA/SCA/SCAI/SIR/STS/SVM Guidelines for the Diagnosis and Management of Patients With Thoracic Aortic Disease. Circulation. 2010; 121: A416-S063 10. Scattered coronary artery calcifications seen. 11. Cholelithiasis; gallbladder otherwise unremarkable. 12. Small left renal cyst noted. 13. Mild scattered calcification along the abdominal aorta and its branches. 14. Enlarged prostate noted.  These results were called by telephone at the time of interpretation on 10/05/2014 at 6:50 pm to Dr. Hosie Poisson, who verbally acknowledged these results.   Electronically Signed   By: Garald Balding M.D.   On: 10/05/2014 19:11   Ct Abdomen Pelvis W Contrast  10/05/2014   CLINICAL DATA:  Acute onset of generalized abdominal pain and weakness. Hypotension. Diarrhea. Hypoxia. Significantly elevated D-dimer. Current history of adenocarcinoma with carcinomatosis, status post chemotherapy. Initial encounter.  EXAM: CT ANGIOGRAPHY CHEST  CT ABDOMEN AND PELVIS WITH CONTRAST  TECHNIQUE: Multidetector CT imaging of the chest was performed using the standard protocol during bolus administration of intravenous contrast. Multiplanar CT image reconstructions and MIPs were obtained to evaluate the vascular anatomy. Multidetector CT imaging of the abdomen and pelvis was performed using the standard protocol during bolus administration of intravenous contrast.  CONTRAST:  181mL OMNIPAQUE  IOHEXOL 350 MG/ML SOLN  COMPARISON:  Abdominal radiographs performed earlier today at 2:12 p.m., and CT of the abdomen and pelvis performed 09/08/2014. CT of the chest performed 03/16/2014  FINDINGS: CTA CHEST FINDINGS  There is no evidence of pulmonary embolus.  Moderate bilateral pleural effusions are seen, with underlying partial consolidation of both lower lung lobes. The expanded  portions of both lungs appear relatively clear. There is no evidence of pneumothorax. No masses are identified; no abnormal focal contrast enhancement is seen.  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 The great vessels are grossly unremarkable in appearance. No pericardial effusion is identified. No definite mediastinal lymphadenopathy is seen.  Scattered coronary artery calcifications are noted. There is suggestion of mild diffuse wall thickening along the esophagus, with associated edema, raising concern for some degree of esophagitis. No axillary lymphadenopathy is seen. The thyroid gland is unremarkable in appearance. A right-sided chest port is noted ending about the distal SVC.  No acute osseous abnormalities are seen.  CT ABDOMEN and PELVIS FINDINGS  There appears to be a very large peripherally enhancing evolving abscess at the right side of the abdomen, partially contiguous with additional collections of fluid throughout the remainder of the abdomen and pelvis. This measures approximately 30.9 x 12.2 x 20.0 cm, extending from the level of the liver down into the pelvis. This contains a large amount of fluid and air. Given that the surgery was a month ago, this is unlikely to reflect residual air. There is no evidence of extravasation of contrast into the collection to suggest a perforated viscus, though it cannot be excluded; contrast appears to extend normally through the visualized bowel anastomoses and into the sigmoid colon. A gas-producing organism is thought to be the most likely etiology of the air.  Underlying moderate to large volume ascites is noted within the abdomen and pelvis; the underlying ascites has lower attenuation than the fluid within the abscess.  There appears to be prominence of intrahepatic biliary ducts and underlying periportal edema, though the common bile duct is not definitely enlarged.  This is of uncertain significance. 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 The spleen is unremarkable in appearance. The superior aspect of the abscess demonstrates some degree of mass effect on the inferior aspect of the liver. A likely small stone is noted along the lateral gallbladder wall. The gallbladder is otherwise grossly unremarkable. The pancreas and adrenal glands are unremarkable.  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 The stomach is grossly unremarkable in appearance, though difficult to fully assess at the level of the hepatic hilum.  A 2.2 cm cyst is noted at the upper pole of the left kidney. The kidneys are otherwise unremarkable. There is no evidence of hydronephrosis. No renal or ureteral stones are seen. No perinephric stranding is appreciated.  There is vague diffuse persistent or recurrent edema involving multiple small bowel loops at the mid and lower abdomen. There is also diffuse nodular mucosal thickening along the remaining ileum extending to the level of the patient's ileocolic anastomosis, and more mild mucosal edema along the distal descending and sigmoid colon. Some of these bowel loops are largely surrounded by the abscess. This may reflect lymphatic congestion, ischemia or infection. Underlying tumor infiltration cannot be excluded, as previously noted. The edematous bowel loops still demonstrate minimal enhancement, without evidence for dead bowel at this time.  No acute vascular abnormalities  are seen. Mild scattered calcification is noted along the abdominal aorta and its branches.  The bladder is mildly distended and grossly unremarkable in appearance. The prostate is enlarged, measuring 5.5 cm in transverse dimension. No inguinal lymphadenopathy is seen.  No acute osseous abnormalities are identified.  Review of the MIP images confirms the above findings.  IMPRESSION: 1. Very large peripherally enhancing evolving abscess at the right side of the  abdomen, partially contiguous with additional collections of fluid throughout the remainder of the abdomen and pelvis. This measures 30.9 x 12.2 x 20.0 cm, extending from the level of the liver down into the pelvis, and contains a large amount of fluid and air. No evidence of extravasation of contrast into the collection to suggest a perforated viscus, though it cannot be excluded. Contrast appears to extend normally through the visualized bowel anastomoses. A gas-producing organism is thought to be the most likely etiology of the air. Given the size of the abscess and surrounding ascites, this is likely too large for successful interventional drainage. Surgical consultation is suggested, as deemed clinically appropriate. 2. Underlying moderate to large volume ascites within the abdomen and pelvis. 3. Vague persistent or recurrent diffuse edema involving multiple small bowel loops at the mid and lower abdomen. Diffuse nodular mucosal thickening along the remaining ileum extending to the ileocolic anastomosis, and more mild mucosal edema along the distal descending and sigmoid colon. This may reflect lymphatic congestion, ischemia or infection. As previously noted, underlying tumor infiltration cannot be excluded. The edematous bowel loops still demonstrate minimal enhancement, without definite dead bowel at this time. 4. No evidence of pulmonary embolus. 5. Moderate bilateral pleural effusions, with underlying partial consolidation of both lower lung lobes. 6. Suggestion of mild diffuse wall thickening along the esophagus, with associated edema, raising concern for some degree of esophagitis. 7. Prominence of the intrahepatic biliary ducts and underlying periportal edema, somewhat more prominent than on the prior study, though the common bile duct is not definitely enlarged. This is of uncertain significance, though it could reflect some degree of obstruction at the hepatic hilum. Underlying cavernous transformation  of the portal vein noted. 8. 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 9. 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 If and when deemed clinically appropriate, would recommend semi-annual imaging followup by CTA or MRA, with elective referral to cardiothoracic surgery if not already obtained. This recommendation follows 2010 ACCF/AHA/AATS/ACR/ASA/SCA/SCAI/SIR/STS/SVM Guidelines for the Diagnosis and Management of Patients With Thoracic Aortic Disease. Circulation. 2010; 121: V494-W967 10. Scattered coronary artery calcifications seen. 11. Cholelithiasis; gallbladder otherwise unremarkable. 12. Small left renal cyst noted. 13. Mild scattered calcification along the abdominal aorta and its branches. 14. Enlarged prostate noted.  These results were called by telephone at the time of interpretation on 10/05/2014 at 6:50 pm to Dr. Hosie Poisson, who verbally acknowledged these results.   Electronically Signed   By: Garald Balding M.D.   On: 10/05/2014 19:11   Dg Abd 2 Views  10/08/2014   CLINICAL DATA:  Recent small bowel obstruction, known abdominal carcinomatosis  EXAM: ABDOMEN - 2 VIEW  COMPARISON:  10/05/2014  FINDINGS: Scattered large and small bowel gas is noted. A drainage catheter is noted in the right mid abdomen. Bibasilar atelectatic changes with associated effusions are noted. No definitive free air is seen. No true obstructive changes are noted.  IMPRESSION: Bibasilar atelectasis and associated effusions similar to that seen on recent CT examination.  Right mid abdominal drainage catheter in  satisfactory position.  No true obstructive changes are noted at this time.   Electronically Signed   By: Inez Catalina M.D.   On: 10/08/2014 15:34   Dg Abd 2 Views  10/05/2014   CLINICAL DATA:  Fever. Shortness of breath. Colostomy revision and bowel resection may 2016.  EXAM: ABDOMEN - 2 VIEW  COMPARISON:  09/08/2014  FINDINGS: Airspace opacities probably from pleural  effusions of the lung bases.  Nondependent collection of gas in the right abdomen with air-fluid level on the left-side-down lateral decubitus views ; this does not track around the inferior liver edge, and I do not see regular lines on the frontal projection, accordingly this may be gas within right-sided bowel, but I am not absolutely certain.  IMPRESSION: 1. Bibasilar pleural effusions with associated atelectasis/airspace opacity. 2. Air- fluid level non-dependently in the abdomen on the lateral decubitus view. The gas does not appear to track along the liver edge and accordingly is probably within dilated bowel, but I am not 902% certain that this is intraluminal. CT abdomen recommended. These results will be called to the ordering clinician or representative by the Radiologist Assistant, and communication documented in the PACS or zVision Dashboard.   Electronically Signed   By: Van Clines M.D.   On: 10/05/2014 14:34   Ct Image Guided Drainage Percut Cath  Peritoneal Retroperit  10/06/2014   CLINICAL DATA:  Abdominal carcinomatosis post laparotomy for bowel obstruction. Now presents with large intra-abdominal abscess.  EXAM: CT GUIDED DRAINAGE OF PERITONEAL ABSCESS  ANESTHESIA/SEDATION: Intravenous Versed were administered as conscious sedation during continuous cardiorespiratory monitoring by the ICU RN, with a total moderate sedation time of less than 30 minutes.  PROCEDURE: The procedure, risks, benefits, and alternatives were explained to the patient. Questions regarding the procedure were encouraged and answered. The patient understands and consents to the procedure.  Select axial scans through the abdomen were obtained. The collection was localized and an appropriate skin entry site was determined and marked.  The operative field was prepped with Betadinein a sterile fashion, and a sterile drape was applied covering the operative field. A sterile gown and sterile gloves were used for the  procedure. Local anesthesia was provided with 1% Lidocaine.  Under CT fluoroscopic guidance, a 19 gauge percutaneous entry needle was advanced into the collection. Purulent material returned. An Amplatz guidewire advanced easily into the collection. Tract dilated to allow placement of a 12 French pigtail catheter, placed within the central aspect of the collection. Catheter position confirmed on CT. Catheter secured externally with 0 Prolene suture and StatLock and placed to gravity bag. 1700 mL of fluid were removed during the procedure. A sample sent for Gram stain, culture and sensitivity.  COMPLICATIONS: None immediate  FINDINGS: Limited CT confirms large gas and fluid loculated collection in the right peritoneal cavity. 12 French abscess drain catheter placed under CT guidance.  IMPRESSION: 1. Technically successful CT-guided peritoneal abscess drain catheter placement.   Electronically Signed   By: Lucrezia Europe M.D.   On: 10/06/2014 09:20    Microbiology: Recent Results (from the past 240 hour(s))  Blood culture (routine x 2)     Status: None (Preliminary result)   Collection Time: 10/05/14  1:53 PM  Result Value Ref Range Status   Specimen Description BLOOD LEFT ANTECUBITAL  Final   Special Requests BOTTLES DRAWN AEROBIC AND ANAEROBIC 5CC EACH  Final   Culture   Final           BLOOD CULTURE RECEIVED  NO GROWTH TO DATE CULTURE WILL BE HELD FOR 5 DAYS BEFORE ISSUING A FINAL NEGATIVE REPORT Performed at [facility]    Report Status PENDING  Incomplete  Blood culture (routine x 2)     Status: None (Preliminary result)   Collection Time: 10/05/14  1:55 PM  Result Value Ref Range Status   Specimen Description BLOOD RIGHT ANTECUBITAL  Final   Special Requests BOTTLES DRAWN AEROBIC AND ANAEROBIC 5CC EACH  Final   Culture   Final           BLOOD CULTURE RECEIVED NO GROWTH TO DATE CULTURE WILL BE HELD FOR 5 DAYS BEFORE ISSUING A FINAL NEGATIVE REPORT Performed at [facility]     Report Status PENDING  Incomplete  Clostridium Difficile by PCR (not at [facility])     Status: None   Collection Time: 10/05/14  7:45 PM  Result Value Ref Range Status   C difficile by pcr NEGATIVE NEGATIVE Final  Culture, routine-abscess     Status: None   Collection Time: 10/05/14  9:57 PM  Result Value Ref Range Status   Specimen Description DRAINAGE  Final   Special Requests Normal  Final   Gram Stain   Final    FEW WBC PRESENT, PREDOMINANTLY PMN NO SQUAMOUS EPITHELIAL CELLS SEEN ABUNDANT GRAM NEGATIVE RODS ABUNDANT GRAM POSITIVE COCCI IN PAIRS FEW GRAM POSITIVE RODS    Culture   Final    MULTIPLE ORGANISMS PRESENT, NONE PREDOMINANT Note: NO STAPHYLOCOCCUS AUREUS ISOLATED NO GROUP A STREP (S.PYOGENES) ISOLATED Performed at [facility]    Report Status 10/09/2014 FINAL  Final     Labs: Basic Metabolic Panel:  Recent Labs Lab 10/06/14 0545 10/07/14 0537 10/08/14 0435 10/09/14 0440 10/10/14 0450  NA 127* 128* 134* 131* 131*  K 3.1* 3.9 4.5 4.6 5.3*  CL 98* 103 106 103 103  CO2 21* 20* 22 23 22   GLUCOSE 116* 97 87 81 85  BUN 9 8 <5* <5* 6  CREATININE 0.43* 0.54* 0.51* 0.56* 0.60*  CALCIUM 6.9* 6.7* 7.2* 7.2* 7.2*  MG 1.4* 1.8  --   --   --    Liver Function Tests:  Recent Labs Lab 10/05/14 0837 10/06/14 0545  AST 20 18  ALT 17 16*  ALKPHOS 165* 114  BILITOT 0.72 0.5  PROT 6.0* 5.1*  ALBUMIN 1.3* 1.2*   No results for input(s): LIPASE, AMYLASE in the last 168 hours. No results for input(s): AMMONIA in the last 168 hours. CBC:  Recent Labs Lab 10/05/14 0837  10/05/14 1350 10/06/14 0545 10/07/14 0537 10/08/14 0435 10/09/14 0440 10/10/14 0450  WBC 20.3*  < > 20.5* 15.1* 14.6* 11.5* 10.3 11.4*  NEUTROABS 18.2*  --  19.1*  --   --   --   --   --   HGB 8.8*  < > 8.6*  9.9* 8.5* 8.2* 8.1* 7.8* 8.2*  HCT 27.2*  < > 26.3*  29.0* 26.2* 25.8* 26.4* 25.1* 25.4*  MCV 82.4  < > 83.8 82.9 82.2 83.8 83.9 84.7  PLT 378  < > 434* 383 419* 402* 363 474*   < > = values in this interval not displayed. Cardiac Enzymes: No results for input(s): CKTOTAL, CKMB, CKMBINDEX, TROPONINI in the last 168 hours. BNP: BNP (last 3 results)  Recent Labs  10/06/14 0545  BNP 574.2*    ProBNP (last 3 results) No results for input(s): PROBNP in the last 8760 hours.  CBG: No results for input(s): GLUCAP in  the last 168 hours.  Time coordinating discharge: 35 minutes  Signed:  Berish Bohman  [facility] 10/10/2014, 9:21 AM

## 2014-10-10 NOTE — Progress Notes (Addendum)
Reviewed discharge information with patient and caregiver. Answered all questions. Patient/caregiver able to teach back medications and reasons to contact MD/911. Patient verbalizes importance of PCP follow up appointment. Wife demonstrates understanding of flushing drainage tube. Barbee [place]. Brigitte Pulse, RN

## 2014-10-10 NOTE — Care Management Note (Addendum)
Case Management Note  Patient Details  Name: Wesley Dillon MRN: [id number] Date of Birth: 02-28-1946     Expected Discharge Date:   10/10/2014             Expected Discharge Plan:  [place]     Discharge planning Services  CM Consult  Post Acute Care Choice:  Home Health Choice offered to:  Patient  DME Arranged:    DME Agency:    HH Arranged:  PT [place]:  [place]  Status of Service:  Completed, signed off  Medicare Important Message Given:  No Date Medicare IM Given:    Medicare IM give by:    Date Additional Medicare IM Given:    Additional Medicare Important Message give by:     If discussed at [place] of Stay Meetings, dates discussed:    Additional Comments: NCM notified [facility] of scheduled dc home today with HH. 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 No DME needed for home.  Erenest Rasher, RN 10/10/2014, 9:29 AM

## 2014-10-11 ENCOUNTER — Other Ambulatory Visit: Payer: Self-pay

## 2014-10-11 ENCOUNTER — Observation Stay (HOSPITAL_COMMUNITY): Payer: 59

## 2014-10-11 ENCOUNTER — Emergency Department (HOSPITAL_COMMUNITY): Payer: 59

## 2014-10-11 ENCOUNTER — Observation Stay (HOSPITAL_COMMUNITY)
Admission: EM | Admit: 2014-10-11 | Discharge: 2014-10-12 | Disposition: A | Discharge status: Home / Self Care | Payer: 59 | Attending: Internal Medicine | Admitting: Internal Medicine

## 2014-10-11 ENCOUNTER — Encounter (HOSPITAL_COMMUNITY): Payer: Self-pay | Admitting: Emergency Medicine

## 2014-10-11 DIAGNOSIS — T8149XA Infection following a procedure, other surgical site, initial encounter: Secondary | ICD-10-CM

## 2014-10-11 DIAGNOSIS — D649 Anemia, unspecified: Secondary | ICD-10-CM

## 2014-10-11 DIAGNOSIS — R197 Diarrhea, unspecified: Secondary | ICD-10-CM | POA: Insufficient documentation

## 2014-10-11 DIAGNOSIS — R0781 Pleurodynia: Secondary | ICD-10-CM

## 2014-10-11 DIAGNOSIS — K651 Peritoneal abscess: Secondary | ICD-10-CM | POA: Insufficient documentation

## 2014-10-11 DIAGNOSIS — I712 Thoracic aortic aneurysm, without rupture: Secondary | ICD-10-CM | POA: Insufficient documentation

## 2014-10-11 DIAGNOSIS — T814XXD Infection following a procedure, subsequent encounter: Secondary | ICD-10-CM | POA: Diagnosis not present

## 2014-10-11 DIAGNOSIS — I10 Essential (primary) hypertension: Secondary | ICD-10-CM | POA: Diagnosis not present

## 2014-10-11 DIAGNOSIS — T8143XA Infection following a procedure, organ and space surgical site, initial encounter: Secondary | ICD-10-CM | POA: Diagnosis present

## 2014-10-11 DIAGNOSIS — R079 Chest pain, unspecified: Secondary | ICD-10-CM

## 2014-10-11 DIAGNOSIS — G250 Essential tremor: Secondary | ICD-10-CM | POA: Insufficient documentation

## 2014-10-11 DIAGNOSIS — C800 Disseminated malignant neoplasm, unspecified: Secondary | ICD-10-CM | POA: Insufficient documentation

## 2014-10-11 DIAGNOSIS — Y838 Other surgical procedures as the cause of abnormal reaction of the patient, or of later complication, without mention of misadventure at the time of the procedure: Secondary | ICD-10-CM | POA: Diagnosis not present

## 2014-10-11 DIAGNOSIS — R0600 Dyspnea, unspecified: Secondary | ICD-10-CM | POA: Diagnosis present

## 2014-10-11 DIAGNOSIS — Y9289 Other specified places as the place of occurrence of the external cause: Secondary | ICD-10-CM | POA: Insufficient documentation

## 2014-10-11 DIAGNOSIS — J90 Pleural effusion, not elsewhere classified: Secondary | ICD-10-CM | POA: Diagnosis not present

## 2014-10-11 DIAGNOSIS — I2510 Atherosclerotic heart disease of native coronary artery without angina pectoris: Secondary | ICD-10-CM | POA: Diagnosis not present

## 2014-10-11 DIAGNOSIS — R188 Other ascites: Secondary | ICD-10-CM | POA: Insufficient documentation

## 2014-10-11 DIAGNOSIS — E8809 Other disorders of plasma-protein metabolism, not elsewhere classified: Secondary | ICD-10-CM | POA: Diagnosis not present

## 2014-10-11 DIAGNOSIS — D630 Anemia in neoplastic disease: Secondary | ICD-10-CM | POA: Diagnosis not present

## 2014-10-11 DIAGNOSIS — E861 Hypovolemia: Secondary | ICD-10-CM | POA: Insufficient documentation

## 2014-10-11 DIAGNOSIS — C801 Malignant (primary) neoplasm, unspecified: Secondary | ICD-10-CM

## 2014-10-11 DIAGNOSIS — E43 Unspecified severe protein-calorie malnutrition: Secondary | ICD-10-CM | POA: Insufficient documentation

## 2014-10-11 DIAGNOSIS — T814XXA Infection following a procedure, initial encounter: Secondary | ICD-10-CM | POA: Insufficient documentation

## 2014-10-11 DIAGNOSIS — L03311 Cellulitis of abdominal wall: Secondary | ICD-10-CM | POA: Insufficient documentation

## 2014-10-11 DIAGNOSIS — K219 Gastro-esophageal reflux disease without esophagitis: Secondary | ICD-10-CM | POA: Diagnosis not present

## 2014-10-11 DIAGNOSIS — R0602 Shortness of breath: Secondary | ICD-10-CM

## 2014-10-11 HISTORY — DX: Gastro-esophageal reflux disease without esophagitis: K21.9

## 2014-10-11 HISTORY — DX: Unspecified intestinal obstruction, unspecified as to partial versus complete obstruction: K56.609

## 2014-10-11 HISTORY — DX: Other disorders of plasma-protein metabolism, not elsewhere classified: E88.09

## 2014-10-11 HISTORY — DX: Thoracic aortic aneurysm, without rupture: I71.2

## 2014-10-11 HISTORY — DX: Infection following a procedure, other surgical site, initial encounter: T81.49XA

## 2014-10-11 HISTORY — DX: Hyperlipidemia, unspecified: E78.5

## 2014-10-11 HISTORY — DX: Pulmonary mycobacterial infection: A31.0

## 2014-10-11 HISTORY — DX: Sepsis, unspecified organism: A41.9

## 2014-10-11 LAB — CULTURE, BLOOD (ROUTINE X 2)
CULTURE: NO GROWTH
CULTURE: NO GROWTH

## 2014-10-11 LAB — CBC WITH DIFFERENTIAL/PLATELET
BASOS ABS: 0 10*3/uL (ref 0.0–0.1)
BASOS PCT: 0 % (ref 0–1)
EOS ABS: 0.2 10*3/uL (ref 0.0–0.7)
Eosinophils Relative: 2 % (ref 0–5)
HEMATOCRIT: 27.9 % — AB (ref 39.0–52.0)
HEMOGLOBIN: 8.7 g/dL — AB (ref 13.0–17.0)
Lymphocytes Relative: 16 % (ref 12–46)
Lymphs Abs: 1.5 10*3/uL (ref 0.7–4.0)
MCH: 26.2 pg (ref 26.0–34.0)
MCHC: 31.2 g/dL (ref 30.0–36.0)
MCV: 84 fL (ref 78.0–100.0)
MONO ABS: 0.9 10*3/uL (ref 0.1–1.0)
Monocytes Relative: 9 % (ref 3–12)
Neutro Abs: 7 10*3/uL (ref 1.7–7.7)
Neutrophils Relative %: 73 % (ref 43–77)
Platelets: 408 10*3/uL — ABNORMAL HIGH (ref 150–400)
RBC: 3.32 MIL/uL — AB (ref 4.22–5.81)
RDW: 18.2 % — AB (ref 11.5–15.5)
WBC: 9.6 10*3/uL (ref 4.0–10.5)

## 2014-10-11 LAB — URINALYSIS, ROUTINE W REFLEX MICROSCOPIC
BILIRUBIN URINE: NEGATIVE
Glucose, UA: NEGATIVE mg/dL
Hgb urine dipstick: NEGATIVE
KETONES UR: NEGATIVE mg/dL
Leukocytes, UA: NEGATIVE
Nitrite: NEGATIVE
PROTEIN: NEGATIVE mg/dL
SPECIFIC GRAVITY, URINE: 1.012 (ref 1.005–1.030)
UROBILINOGEN UA: 0.2 mg/dL (ref 0.0–1.0)
pH: 6.5 (ref 5.0–8.0)

## 2014-10-11 LAB — BASIC METABOLIC PANEL
Anion gap: 6 (ref 5–15)
BUN: 6 mg/dL (ref 6–20)
CHLORIDE: 101 mmol/L (ref 101–111)
CO2: 24 mmol/L (ref 22–32)
Calcium: 7.4 mg/dL — ABNORMAL LOW (ref 8.9–10.3)
Creatinine, Ser: 0.63 mg/dL (ref 0.61–1.24)
GFR calc Af Amer: >60 mL/min (ref >60)
GFR calc non Af Amer: >60 mL/min (ref >60)
Glucose, Bld: 96 mg/dL (ref 65–99)
Potassium: 4.3 mmol/L (ref 3.5–5.1)
Sodium: 131 mmol/L — ABNORMAL LOW (ref 135–145)

## 2014-10-11 LAB — LACTIC ACID, PLASMA: Lactic Acid, Venous: 1.2 mmol/L (ref 0.5–2.0)

## 2014-10-11 LAB — BRAIN NATRIURETIC PEPTIDE: B Natriuretic Peptide: 223.3 pg/mL — ABNORMAL HIGH (ref 0.0–100.0)

## 2014-10-11 LAB — TROPONIN I: Troponin I: 0.03 ng/mL (ref <0.031)

## 2014-10-11 MED ORDER — FUROSEMIDE 10 MG/ML IJ SOLN
20.0000 mg | Freq: Two times a day (BID) | INTRAMUSCULAR | Status: DC
  Administered 2014-10-11: 20 mg via INTRAVENOUS
  Filled 2014-10-11: qty 2

## 2014-10-11 MED ORDER — MORPHINE SULFATE 15 MG PO TABS: 15.0000 mg | ORAL_TABLET | ORAL | Status: DC | PRN

## 2014-10-11 MED ORDER — BOOST PLUS PO LIQD
237.0000 mL | Freq: Two times a day (BID) | ORAL | Status: DC
  Filled 2014-10-11 (×3): qty 237

## 2014-10-11 MED ORDER — ACETAMINOPHEN 500 MG PO TABS: 1000.0000 mg | ORAL_TABLET | Freq: Four times a day (QID) | ORAL | Status: DC | PRN

## 2014-10-11 MED ORDER — SUCRALFATE 1 GM/10ML PO SUSP
1.0000 g | Freq: Three times a day (TID) | ORAL | Status: DC
  Administered 2014-10-11 – 2014-10-12 (×5): 1 g via ORAL
  Filled 2014-10-11 (×6): qty 10

## 2014-10-11 MED ORDER — MEGESTROL ACETATE 40 MG/ML PO SUSP
400.0000 mg | Freq: Every day | ORAL | Status: DC
  Administered 2014-10-11 – 2014-10-12 (×2): 400 mg via ORAL
  Filled 2014-10-11 (×2): qty 10

## 2014-10-11 MED ORDER — FLUCONAZOLE 100 MG PO TABS
100.0000 mg | ORAL_TABLET | Freq: Every day | ORAL | Status: DC
  Administered 2014-10-11: 100 mg via ORAL
  Filled 2014-10-11 (×2): qty 1

## 2014-10-11 MED ORDER — ENSURE ENLIVE PO LIQD
237.0000 mL | Freq: Two times a day (BID) | ORAL | Status: DC
Start: 2014-10-11 — End: 2014-10-12
  Administered 2014-10-12: 237 mL via ORAL

## 2014-10-11 MED ORDER — FAMOTIDINE 20 MG PO TABS
20.0000 mg | ORAL_TABLET | Freq: Two times a day (BID) | ORAL | Status: DC
Start: 2014-10-11 — End: 2014-10-12
  Administered 2014-10-11 – 2014-10-12 (×3): 20 mg via ORAL
  Filled 2014-10-11 (×3): qty 1

## 2014-10-11 MED ORDER — PROCHLORPERAZINE MALEATE 10 MG PO TABS: 10.0000 mg | ORAL_TABLET | Freq: Four times a day (QID) | ORAL | Status: DC | PRN

## 2014-10-11 MED ORDER — PANTOPRAZOLE SODIUM 40 MG PO TBEC
40.0000 mg | DELAYED_RELEASE_TABLET | Freq: Two times a day (BID) | ORAL | Status: DC
  Administered 2014-10-11 – 2014-10-12 (×3): 40 mg via ORAL
  Filled 2014-10-11 (×4): qty 1

## 2014-10-11 MED ORDER — PROPRANOLOL HCL 10 MG PO TABS
10.0000 mg | ORAL_TABLET | Freq: Two times a day (BID) | ORAL | Status: DC
  Administered 2014-10-11: 10 mg via ORAL
  Filled 2014-10-11 (×3): qty 1

## 2014-10-11 MED ORDER — IBUPROFEN 800 MG PO TABS: 800.0000 mg | ORAL_TABLET | Freq: Four times a day (QID) | ORAL | Status: DC | PRN

## 2014-10-11 MED ORDER — SODIUM CHLORIDE 0.9 % IV SOLN
INTRAVENOUS | Status: DC
Start: 2014-10-11 — End: 2014-10-11
  Administered 2014-10-11: 10:00:00 via INTRAVENOUS

## 2014-10-11 MED ORDER — FUROSEMIDE 10 MG/ML IJ SOLN
20.0000 mg | Freq: Once | INTRAMUSCULAR | Status: AC
  Administered 2014-10-11: 20 mg via INTRAVENOUS
  Filled 2014-10-11: qty 4

## 2014-10-11 MED ORDER — SACCHAROMYCES BOULARDII 250 MG PO CAPS
250.0000 mg | ORAL_CAPSULE | Freq: Two times a day (BID) | ORAL | Status: DC
  Administered 2014-10-11 – 2014-10-12 (×3): 250 mg via ORAL
  Filled 2014-10-11 (×3): qty 1

## 2014-10-11 MED ORDER — DIPHENOXYLATE-ATROPINE 2.5-0.025 MG PO TABS
1.0000 | ORAL_TABLET | Freq: Four times a day (QID) | ORAL | Status: DC | PRN
  Administered 2014-10-11 – 2014-10-12 (×3): 1 via ORAL
  Filled 2014-10-11 (×3): qty 1

## 2014-10-11 MED ORDER — ENOXAPARIN SODIUM 40 MG/0.4ML ~~LOC~~ SOLN
40.0000 mg | SUBCUTANEOUS | Status: DC
  Administered 2014-10-11: 40 mg via SUBCUTANEOUS
  Filled 2014-10-11 (×2): qty 0.4

## 2014-10-11 MED ORDER — LIDOCAINE 5 % EX PTCH
1.0000 | MEDICATED_PATCH | CUTANEOUS | Status: DC
  Administered 2014-10-11: 1 via TRANSDERMAL
  Filled 2014-10-11 (×2): qty 1

## 2014-10-11 MED ORDER — ADULT MULTIVITAMIN W/MINERALS CH
1.0000 | ORAL_TABLET | Freq: Every day | ORAL | Status: DC
  Administered 2014-10-11: 1 via ORAL
  Filled 2014-10-11 (×2): qty 1

## 2014-10-11 MED ORDER — ONDANSETRON 4 MG PO TBDP: 4.0000 mg | ORAL_TABLET | ORAL | Status: DC | PRN

## 2014-10-11 MED ORDER — PRIMIDONE 250 MG PO TABS
250.0000 mg | ORAL_TABLET | Freq: Every day | ORAL | Status: DC
  Administered 2014-10-11: 250 mg via ORAL
  Filled 2014-10-11: qty 1

## 2014-10-11 MED ORDER — CIPROFLOXACIN HCL 500 MG PO TABS
500.0000 mg | ORAL_TABLET | Freq: Two times a day (BID) | ORAL | Status: DC
  Administered 2014-10-11 – 2014-10-12 (×3): 500 mg via ORAL
  Filled 2014-10-11 (×3): qty 1

## 2014-10-11 MED ORDER — METRONIDAZOLE 500 MG PO TABS
500.0000 mg | ORAL_TABLET | Freq: Three times a day (TID) | ORAL | Status: DC
  Administered 2014-10-11 – 2014-10-12 (×4): 500 mg via ORAL
  Filled 2014-10-11 (×5): qty 1

## 2014-10-11 MED ORDER — IOHEXOL 350 MG/ML SOLN
100.0000 mL | Freq: Once | INTRAVENOUS | Status: AC | PRN
  Administered 2014-10-11: 100 mL via INTRAVENOUS

## 2014-10-11 NOTE — ED Provider Notes (Signed)
CSN: [id number]     Arrival date & time 10/11/14  0645 History   First MD Initiated Contact with Patient 10/11/14 [phone]     Chief Complaint  Patient presents with  . Chest Pain  . Shortness of Breath      HPI Pt was seen at 0715. Per pt, c/o gradual onset and worsening of persistent SOB since last evening. Pt was discharged from the hospital yesterday afternoon after admission for SOB and intra-abd abscess (s/p drain placement 10/05/14). Pt states his SOB worsens when he lays down. Has been associated with CP. Denies palpitations, no cough, no abd pain, no N/V/D, no fevers, no back pain.     Past Medical History  Diagnosis Date  . Tremor     takes Primidone 250 once daily and inderall 160 daily for this-has had it since he was 21  . Pneumonia 06/2012  . Macular degeneration   . Benign essential tremor   . Coronary artery disease   . Anemia 06/30/2014  . 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 Essential hypertension 08/18/2014  . 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 Ascending aortic aneurysm 08/2014  . Sepsis 09/2014  . GERD (gastroesophageal reflux disease)   . Postoperative intra-abdominal abscess 09/2014  . SBO (small bowel obstruction)     recurrent  . Hyperlipidemia   . Hypoalbuminemia 09/2014   Past Surgical History  Procedure Laterality Date  . Tonsillectomy    . Video bronchoscopy Bilateral 04/29/2013    Procedure: VIDEO BRONCHOSCOPY WITHOUT FLUORO;  Surgeon: Collene Gobble, MD;  Location: [facility];  Service: Cardiopulmonary;  Laterality: Bilateral;  . Laparotomy N/A 09/09/2014    Procedure: EXPLORATORY LAPAROTOMY resection terminal ileum and right colon;  Surgeon: Excell Seltzer, MD;  Location: [facility];  Service: General;  Laterality: N/A;  . Bowel resection N/A 09/09/2014    Procedure: SMALL BOWEL bypass;  Surgeon: Excell Seltzer, MD;  Location: [facility];  Service: General;  Laterality: N/A;  . Colostomy revision  09/09/2014    Procedure: COLON  RESECTION RIGHT;  Surgeon: Excell Seltzer, MD;  Location: [facility];  Service: General;;  . Abcess drainage  10/05/2014    intra-abd abscess, drain placed by IR   Family History  Problem Relation Age of Onset  . CAD Father 50    Died MI  . Atrial fibrillation Father   . Asthma Father   . Alzheimer's disease Mother   . Diabetes Mother   . Heart disease Mother   . Atrial fibrillation Mother   . Seizures Son     Died status epilepticus   History  Substance Use Topics  . Smoking status: Never Smoker   . Smokeless tobacco: Never Used  . Alcohol Use: No     Comment: wine with dinner    Review of Systems ROS: Statement: All systems negative except as marked or noted in the HPI; Constitutional: Negative for fever and chills. ; ; Eyes: Negative for eye pain, redness and discharge. ; ; ENMT: Negative for ear pain, hoarseness, nasal congestion, sinus pressure and sore throat. ; ; Cardiovascular: +dyspnea, CP. Negative for palpitations, diaphoresis, and peripheral edema. ; ; Respiratory: Negative for cough, wheezing and stridor. ; ; Gastrointestinal: Negative for nausea, vomiting, diarrhea, abdominal pain, blood in stool, hematemesis, jaundice and rectal bleeding. . ; ; Genitourinary: Negative for dysuria, flank pain and hematuria. ; ; Musculoskeletal: Negative for back pain and neck pain. Negative for swelling and trauma.; ; Skin: Negative for pruritus, rash, abrasions, blisters, bruising and  skin lesion.; ; Neuro: Negative for headache, lightheadedness and neck stiffness. Negative for weakness, altered level of consciousness , altered mental status, extremity weakness, paresthesias, involuntary movement, seizure and syncope.      Allergies  Review of patient's allergies indicates no known allergies.  Home Medications   Prior to Admission medications   Medication Sig Start Date End Date Taking? Authorizing Provider  acetaminophen (TYLENOL) 500 MG tablet Take 1,000 mg by mouth every 6 (six)  hours as needed for moderate pain or headache.   Yes Historical Provider, MD  ciprofloxacin (CIPRO) 500 MG tablet Take 1 tablet (500 mg total) by mouth 2 (two) times daily. 10/09/14  Yes Janece Canterbury, MD  diphenoxylate-atropine (LOMOTIL) 2.5-0.025 MG per tablet Take 1 tablet by mouth 4 (four) times daily as needed for diarrhea or loose stools. 10/05/14  Yes Ladell Pier, MD  fluconazole (DIFLUCAN) 100 MG tablet Take 1 tablet (100 mg total) by mouth daily. 10/05/14  Yes Ladell Pier, MD  ibuprofen (ADVIL,MOTRIN) 200 MG tablet Take 800 mg by mouth every 6 (six) hours as needed for headache or moderate pain.   Yes Historical Provider, MD  lidocaine-prilocaine (EMLA) cream Apply 1 application topically as needed. Apply to [facility] 1-2 hours prior to stick and cover with plastic wrap 07/08/14  Yes Ladell Pier, MD  megestrol (MEGACE) 40 MG/ML suspension Take 10 mLs (400 mg total) by mouth daily. 10/09/14  Yes Janece Canterbury, MD  metroNIDAZOLE (FLAGYL) 500 MG tablet Take 1 tablet (500 mg total) by mouth 3 (three) times daily. 10/09/14  Yes Janece Canterbury, MD  Multiple Vitamins-Minerals (PRESERVISION AREDS PO) Take 2 capsules by mouth every morning.    Yes Historical Provider, MD  ondansetron (ZOFRAN ODT) 4 MG disintegrating tablet Take 1 tablet (4 mg total) by mouth every 4 (four) hours as needed for nausea or vomiting. 08/11/14  Yes Ladell Pier, MD  oxyCODONE (OXY IR/ROXICODONE) 5 MG immediate release tablet Take 1-2 tablets (5-10 mg total) by mouth every 4 (four) hours as needed for severe pain. 07/28/14  Yes Ladell Pier, MD  pantoprazole (PROTONIX) 40 MG tablet Take 1 tablet (40 mg total) by mouth 2 (two) times daily. 10/09/14  Yes Janece Canterbury, MD  PRESCRIPTION MEDICATION Chemo [facility]   Yes Historical Provider, MD  primidone (MYSOLINE) 250 MG tablet Take 1 tablet (250 mg total) by mouth at bedtime. Patient taking differently: Take 250 mg by mouth every morning.  10/21/13  Yes Asencion Partridge Dohmeier, MD   prochlorperazine (COMPAZINE) 10 MG tablet Take 1 tablet (10 mg total) by mouth every 6 (six) hours as needed for nausea. 07/08/14  Yes Ladell Pier, MD  propranolol (INDERAL) 10 MG tablet Take 1 tablet (10 mg total) by mouth 2 (two) times daily. 09/23/14  Yes Ladell Pier, MD  ranitidine (ZANTAC) 150 MG tablet Take 150 mg by mouth 2 (two) times daily.   Yes Historical Provider, MD  saccharomyces boulardii (FLORASTOR) 250 MG capsule Take 1 capsule (250 mg total) by mouth 2 (two) times daily. 09/18/14  Yes Donne Hazel, MD  sodium chloride 0.9 % injection Inject 10 mLs into the vein daily. 10/09/14  Yes Janece Canterbury, MD  sucralfate (CARAFATE) 1 G tablet Take 1 tablet (1 g total) by mouth 4 (four) times daily -  with meals and at bedtime. Chew prior to swallowing 10/09/14  Yes Janece Canterbury, MD  HYDROcodone-acetaminophen (NORCO/VICODIN) 5-325 MG per tablet Take 1-2 tablets by mouth every 4 (four) hours as  needed for moderate pain. Patient not taking: Reported on 09/23/2014 08/11/14   Ladell Pier, MD   BP 112/72 mmHg  Pulse 80  Temp(Src) 98.4 F (36.9 C) (Oral)  Resp 15  Ht 6\' 1"  (1.854 m)  Wt 174 lb (78.926 kg)  BMI 22.96 kg/m2  SpO2 95%   Filed Vitals:   10/11/14 0652 10/11/14 0732 10/11/14 0800 10/11/14 0830  BP: 97/64 105/68 111/75 112/72  Pulse: 94  83 80  Temp: 98.4 F (36.9 C)     TempSrc: Oral     Resp: 18 18 18 15   Height: 6\' 1"  (1.854 m)     Weight: 174 lb (78.926 kg)     SpO2: 96% 94% 94% 95%     07:33:07 Orthostatic Vital Signs AK  Orthostatic Lying  - BP- Lying: 105/68 mmHg ; Pulse- Lying: 85  Orthostatic Sitting - BP- Sitting: 100/67 mmHg ; Pulse- Sitting: 91  Orthostatic Standing at 0 minutes - BP- Standing at 0 minutes: 101/61 mmHg ; Pulse- Standing at 0 minutes: 92      Physical Exam  0720: Physical examination:  Nursing notes reviewed; Vital signs and O2 SAT reviewed;  Constitutional: Thin, frail. In no acute distress; Head:  Normocephalic,  atraumatic; Eyes: EOMI, PERRL, No scleral icterus; ENMT: Mouth and pharynx normal, Mucous membranes dry;; Neck: Supple, Full range of motion, No lymphadenopathy; Cardiovascular: Regular rate and rhythm, No gallop; Respiratory: Breath sounds diminshed & equal bilaterally, No wheezes.  Speaking full sentences, sitting upright. Normal respiratory effort/excursion; Chest: Nontender, Movement normal; Abdomen: Soft, Nontender. +distended, Normal bowel sounds. +serous fluid in drainage bag.; Genitourinary: No CVA tenderness; Extremities: Pulses normal, No tenderness, +2 pedal edema R>L.; Neuro: AA&Ox3, Major CN grossly intact.  Speech clear. No gross focal motor or sensory deficits in extremities.; Skin: Color normal, Warm, Dry.    ED Course  Procedures     EKG Interpretation   Date/Time:  Sunday October 11 2014 07:29:30 EDT Ventricular Rate:  83 PR Interval:  136 QRS Duration: 83 QT Interval:  381 QTC Calculation: 448 R Axis:   5 Text Interpretation:  Sinus rhythm Atrial premature complexes Low voltage,  extremity leads Baseline wander When compared with ECG of 10/06/2014 QT has  shortened Confirmed by [facility]  MD, Nunzio Cory [phone]) on 10/11/2014 8:07:18  AM      MDM  MDM Reviewed: previous chart, nursing note and vitals Reviewed previous: labs, ECG and CT scan Interpretation: labs, ECG and x-ray     Results for orders placed or performed during the hospital encounter of 10/11/14  CBC with Differential  Result Value Ref Range   WBC 9.6 4.0 - 10.5 K/uL   RBC 3.32 (L) 4.22 - 5.81 MIL/uL   Hemoglobin 8.7 (L) 13.0 - 17.0 g/dL   HCT 27.9 (L) 39.0 - 52.0 %   MCV 84.0 78.0 - 100.0 fL   MCH 26.2 26.0 - 34.0 pg   MCHC 31.2 30.0 - 36.0 g/dL   RDW 18.2 (H) 11.5 - 15.5 %   Platelets 408 (H) 150 - 400 K/uL   Neutrophils Relative % 73 43 - 77 %   Neutro Abs 7.0 1.7 - 7.7 K/uL   Lymphocytes Relative 16 12 - 46 %   Lymphs Abs 1.5 0.7 - 4.0 K/uL   Monocytes Relative 9 3 - 12 %   Monocytes  Absolute 0.9 0.1 - 1.0 K/uL   Eosinophils Relative 2 0 - 5 %   Eosinophils Absolute 0.2 0.0 - 0.7 K/uL  Basophils Relative 0 0 - 1 %   Basophils Absolute 0.0 0.0 - 0.1 K/uL  Lactic acid, plasma  Result Value Ref Range   Lactic Acid, Venous 1.2 0.5 - 2.0 mmol/L  Troponin I  Result Value Ref Range   Troponin I 0.03 <0.031 ng/mL  Basic metabolic panel  Result Value Ref Range   Sodium 131 (L) 135 - 145 mmol/L   Potassium 4.3 3.5 - 5.1 mmol/L   Chloride 101 101 - 111 mmol/L   CO2 24 22 - 32 mmol/L   Glucose, Bld 96 65 - 99 mg/dL   BUN 6 6 - 20 mg/dL   Creatinine, Ser 0.63 0.61 - 1.24 mg/dL   Calcium 7.4 (L) 8.9 - 10.3 mg/dL   GFR calc non Af Amer >60 >60 mL/min   GFR calc Af Amer >60 >60 mL/min   Anion gap 6 5 - 15  Brain natriuretic peptide  Result Value Ref Range   B Natriuretic Peptide 223.3 (H) 0.0 - 100.0 pg/mL   Dg Chest 2 View 10/11/2014   CLINICAL DATA:  Short of breath and weakness  EXAM: CHEST  2 VIEW  COMPARISON:  10/05/2014  FINDINGS: Right jugular Port-A-Cath is stable. Bilateral pleural effusions have increased. No pneumothorax. Cardiomegaly.  IMPRESSION: Increasing bilateral pleural effusions.   Electronically Signed   By: Marybelle Killings M.D.   On: 10/11/2014 08:22    Results for CORBETT, MOULDER (MRN [id number]) as of 10/11/2014 09:15  Ref. Range 10/07/2014 05:37 10/08/2014 04:35 10/09/2014 04:40 10/10/2014 04:50 10/11/2014 08:03  Hemoglobin Latest Ref Range: 13.0-17.0 g/dL 8.2 (L) 8.1 (L) 7.8 (L) 8.2 (L) 8.7 (L)  HCT Latest Ref Range: 39.0-52.0 % 25.8 (L) 26.4 (L) 25.1 (L) 25.4 (L) 27.9 (L)     0940:  Increasing pleural effusions on CXR likely cause for increasing SOB. EKG without acute STTW changes and troponin negative. BP labile, but c/w previous readings in EPIC. Lactic acid and WBC count normal, pt afebrile; doubt sepsis at this time. H/H per baseline.  Dx and testing d/w pt and family.  Questions answered.  Verb understanding, agreeable to admit.  T/C to [facility].  Sheran Fava, case discussed, including:  HPI, pertinent PM/SHx, VS/PE, dx testing, ED course and treatment:  Agreeable to admit, requests to write temporary orders, obtain tele bed to team [facility].          Francine Graven, DO 10/13/14 [phone]

## 2014-10-11 NOTE — ED Notes (Signed)
Patient transported to X-ray 

## 2014-10-11 NOTE — ED Notes (Signed)
Bed: WA03 Expected date:  Expected time:  Means of arrival:  Comments: 

## 2014-10-11 NOTE — ED Notes (Signed)
Patient transported to CT 

## 2014-10-11 NOTE — Progress Notes (Signed)
Although his PTA med list states he takes Inderal BID pt states he actually only takes it QD at home.   His SBP has been in 90's since this afternoon and after lasix today.  Currently it is 91/68.  Will hold inderal for tonight.

## 2014-10-11 NOTE — H&P (Signed)
[facility] History and Physical  Wesley Dillon AJO:[id number] DOB: Apr 07, 1946 DOA: 10/11/2014  Referring physician:  Francine Graven PCP:  Gerrit Heck, MD   Chief Complaint:  SOB and chest pain  HPI:  The patient is a 69 y.o. year-old male with history of adenocarcinoma carcinomatosis, CAD, anemia, hypertension, intraabdominal abscess s/p drain placement on antibiotics, severe protein calorie malnutrition with multiple recent hospital admissions who presented with sudden onset chest pain and SOB.  He was diagnosed with adenocarcinomatosis in 06/2014 and was undergoing treatment with FOLFOX by Dr. [name].  He was admitted in early May with SBO and again in mid-May for the same at which time he required ex-lap with resection of the terminal ileium and right colon with small bowel bypass.  He was discharged home on a regular diet and was readmitted a few weeks later with polymicrobial intraabdominal abscess and severe malnutrition.  He had a drain placed by IR and was placed on broad spectrum antibiotics.  He was discharged home yesterday feeling deconditioned but otherwise well.    Last night, he developed sudden onset 4/10 pleuritic left chest pain without radiation but with increasing SOB.  He denies associated nausea, diaphoresis or lightheadedness.  He has some baseline DOE but was able to ambulate down his driveway and back yesterday during the day.  Since last night, however, he has had dyspnea at rest.  Breathing worse with lying flat.  Nonproductive cough.  Denies fevers, chills, increased pain or swelling of bilateral lower extremities.  Duplex of lower extremities was negative on 10/08/2014.    In the ER, his VS were stable on RA and labs were near his baseline.  Last albumin a few days ago was 1.2.  CXR demonstrated increasing bilateral pleural effusions.  He was given a dose of IV lasix 20mg  and CT angio chest to rule out PE is pending.    Review of Systems:   General:  Denies fevers, chills, weight loss or gain HEENT:  Denies changes to hearing and vision, rhinorrhea, sinus congestion, sore throat CV:  Denies  Palpitations, chronic lower extremity edema.  PULM:  Per HPI GI:  Denies nausea, vomiting, constipation, he has chronic diarrhea.   GU:  Denies dysuria, frequency, urgency ENDO:  Denies polyuria, polydipsia.   HEME:  Denies hematemesis, blood in stools, melena, abnormal bruising or bleeding.  LYMPH:  Denies lymphadenopathy.   MSK:  Denies arthralgias, myalgias.   DERM:  Denies skin rash or ulcer.   NEURO:  Denies focal numbness, weakness, slurred speech, confusion, facial droop.  PSYCH:  Denies anxiety and depression.    Past Medical History  Diagnosis Date  . Tremor     takes Primidone 250 once daily and inderall 160 daily for this-has had it since he was 21  . Pneumonia 06/2012  . Macular degeneration   . Benign essential tremor   . Coronary artery disease   . Anemia 06/30/2014  . 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 Essential hypertension 08/18/2014  . 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 Ascending aortic aneurysm 08/2014  . Sepsis 09/2014  . GERD (gastroesophageal reflux disease)   . Postoperative intra-abdominal abscess 09/2014  . SBO (small bowel obstruction)     recurrent  . Hyperlipidemia   . Hypoalbuminemia 09/2014   Past Surgical History  Procedure Laterality Date  . Tonsillectomy    . Video bronchoscopy Bilateral 04/29/2013    Procedure: VIDEO BRONCHOSCOPY WITHOUT FLUORO;  Surgeon: Collene Gobble, MD;  Location: [facility];  Service: Cardiopulmonary;  Laterality: Bilateral;  . Laparotomy N/A 09/09/2014    Procedure: EXPLORATORY LAPAROTOMY resection terminal ileum and right colon;  Surgeon: Excell Seltzer, MD;  Location: [facility]S;  Service: General;  Laterality: N/A;  . Bowel resection N/A 09/09/2014    Procedure: SMALL BOWEL bypass;  Surgeon: Excell Seltzer, MD;  Location: [facility];   Service: General;  Laterality: N/A;  . Colostomy revision  09/09/2014    Procedure: COLON RESECTION RIGHT;  Surgeon: Excell Seltzer, MD;  Location: [facility]S;  Service: General;;  . Abcess drainage  10/05/2014    intra-abd abscess, drain placed by IR   Social History:  reports that he has never smoked. He has never used smokeless tobacco. He reports that he does not drink alcohol or use illicit drugs. 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    No Known Allergies  Family History  Problem Relation Age of Onset  . CAD Father 76    Died MI  . Atrial fibrillation Father   . Asthma Father   . Alzheimer's disease Mother   . Diabetes Mother   . Heart disease Mother   . Atrial fibrillation Mother   . Seizures Son     Died status epilepticus     Prior to Admission medications   Medication Sig Start Date End Date Taking? Authorizing Provider  acetaminophen (TYLENOL) 500 MG tablet Take 1,000 mg by mouth every 6 (six) hours as needed for moderate pain or headache.   Yes Historical Provider, MD  ciprofloxacin (CIPRO) 500 MG tablet Take 1 tablet (500 mg total) by mouth 2 (two) times daily. 10/09/14  Yes Janece Canterbury, MD  diphenoxylate-atropine (LOMOTIL) 2.5-0.025 MG per tablet Take 1 tablet by mouth 4 (four) times daily as needed for diarrhea or loose stools. 10/05/14  Yes Ladell Pier, MD  fluconazole (DIFLUCAN) 100 MG tablet Take 1 tablet (100 mg total) by mouth daily. 10/05/14  Yes Ladell Pier, MD  ibuprofen (ADVIL,MOTRIN) 200 MG tablet Take 800 mg by mouth every 6 (six) hours as needed for headache or moderate pain.   Yes Historical Provider, MD  lidocaine-prilocaine (EMLA) cream Apply 1 application topically as needed. Apply to [facility] 1-2 hours prior to stick and cover with plastic wrap 07/08/14  Yes Ladell Pier, MD  megestrol (MEGACE) 40 MG/ML suspension Take 10 mLs (400 mg total) by mouth daily. 10/09/14  Yes Janece Canterbury, MD  metroNIDAZOLE (FLAGYL) 500 MG tablet Take 1 tablet (500 mg  total) by mouth 3 (three) times daily. 10/09/14  Yes Janece Canterbury, MD  Multiple Vitamins-Minerals (PRESERVISION AREDS PO) Take 2 capsules by mouth every morning.    Yes Historical Provider, MD  ondansetron (ZOFRAN ODT) 4 MG disintegrating tablet Take 1 tablet (4 mg total) by mouth every 4 (four) hours as needed for nausea or vomiting. 08/11/14  Yes Ladell Pier, MD  oxyCODONE (OXY IR/ROXICODONE) 5 MG immediate release tablet Take 1-2 tablets (5-10 mg total) by mouth every 4 (four) hours as needed for severe pain. 07/28/14  Yes Ladell Pier, MD  pantoprazole (PROTONIX) 40 MG tablet Take 1 tablet (40 mg total) by mouth 2 (two) times daily. 10/09/14  Yes Janece Canterbury, MD  PRESCRIPTION MEDICATION Chemo [facility]   Yes Historical Provider, MD  primidone (MYSOLINE) 250 MG tablet Take 1 tablet (250 mg total) by mouth at bedtime. Patient taking differently: Take 250 mg by mouth every morning.  10/21/13  Yes Asencion Partridge Dohmeier, MD  prochlorperazine (COMPAZINE) 10 MG tablet Take  1 tablet (10 mg total) by mouth every 6 (six) hours as needed for nausea. 07/08/14  Yes Ladell Pier, MD  propranolol (INDERAL) 10 MG tablet Take 1 tablet (10 mg total) by mouth 2 (two) times daily. 09/23/14  Yes Ladell Pier, MD  ranitidine (ZANTAC) 150 MG tablet Take 150 mg by mouth 2 (two) times daily.   Yes Historical Provider, MD  saccharomyces boulardii (FLORASTOR) 250 MG capsule Take 1 capsule (250 mg total) by mouth 2 (two) times daily. 09/18/14  Yes Donne Hazel, MD  sodium chloride 0.9 % injection Inject 10 mLs into the vein daily. 10/09/14  Yes Janece Canterbury, MD  sucralfate (CARAFATE) 1 G tablet Take 1 tablet (1 g total) by mouth 4 (four) times daily -  with meals and at bedtime. Chew prior to swallowing 10/09/14  Yes Janece Canterbury, MD  HYDROcodone-acetaminophen (NORCO/VICODIN) 5-325 MG per tablet Take 1-2 tablets by mouth every 4 (four) hours as needed for moderate pain. Patient not taking: Reported on 09/23/2014 08/11/14    Ladell Pier, MD   Physical Exam: Filed Vitals:   10/11/14 0830 10/11/14 0900 10/11/14 0930 10/11/14 1000  BP: 112/72 107/70 120/72 122/81  Pulse: 80 78 77 81  Temp:      TempSrc:      Resp: 15 16 14 19   Height:      Weight:      SpO2: 95% 98% 96% 96%     General:  Cachectic appearing adult male, NAD on RA  Eyes:  PERRL, anicteric, non-injected.  ENT:  Nares clear.  OP clear, non-erythematous without plaques or exudates.  MMM.  Neck:  Supple without TM or JVD.    Lymph:  No cervical, supraclavicular, or submandibular LAD.  Cardiovascular:  RRR, normal S1, S2, without m/r/g.  2+ pulses, warm extremities  Respiratory:  Diminished at the bilateral bases, no wheezes, rales, or rhonchi, no increased WOB.  Abdomen:  NABS.  Soft, mildly distended with faint patchy erythema along superior aspect of midline incision and increased erythema at the base compared to yesterday.    Skin:  No rashes or focal lesions.  No vesicles or rash on left chest.  Musculoskeletal:  Normal bulk and tone.  2+ soft pitting edema bilateral lower extremities.  No pain with palpitation of the lateral left chest wall.    Psychiatric:  A & O x 4.  Appropriate affect.  Neurologic:  CN 3-12 intact.  4/5 strength throughout.  Sensation intact.  Labs on Admission:  Basic Metabolic Panel:  Recent Labs Lab 10/06/14 0545 10/07/14 0537 10/08/14 0435 10/09/14 0440 10/10/14 0450 10/11/14 0803  NA 127* 128* 134* 131* 131* 131*  K 3.1* 3.9 4.5 4.6 5.3* 4.3  CL 98* 103 106 103 103 101  CO2 21* 20* 22 23 22 24   GLUCOSE 116* 97 87 81 85 96  BUN 9 8 <5* <5* 6 6  CREATININE 0.43* 0.54* 0.51* 0.56* 0.60* 0.63  CALCIUM 6.9* 6.7* 7.2* 7.2* 7.2* 7.4*  MG 1.4* 1.8  --   --   --   --    Liver Function Tests:  Recent Labs Lab 10/05/14 0837 10/06/14 0545  AST 20 18  ALT 17 16*  ALKPHOS 165* 114  BILITOT 0.72 0.5  PROT 6.0* 5.1*  ALBUMIN 1.3* 1.2*   No results for input(s): LIPASE, AMYLASE in the  last 168 hours. No results for input(s): AMMONIA in the last 168 hours. CBC:  Recent Labs Lab 10/05/14 0837 10/05/14  1350  10/07/14 0537 10/08/14 0435 10/09/14 0440 10/10/14 0450 10/11/14 0803  WBC 20.3* 20.5*  < > 14.6* 11.5* 10.3 11.4* 9.6  NEUTROABS 18.2* 19.1*  --   --   --   --   --  7.0  HGB 8.8* 8.6*  9.9*  < > 8.2* 8.1* 7.8* 8.2* 8.7*  HCT 27.2* 26.3*  29.0*  < > 25.8* 26.4* 25.1* 25.4* 27.9*  MCV 82.4 83.8  < > 82.2 83.8 83.9 84.7 84.0  PLT 378 434*  < > 419* 402* 363 474* 408*  < > = values in this interval not displayed. Cardiac Enzymes:  Recent Labs Lab 10/11/14 0803  TROPONINI 0.03    BNP (last 3 results)  Recent Labs  10/06/14 0545 10/11/14 0802  BNP 574.2* 223.3*    ProBNP (last 3 results) No results for input(s): PROBNP in the last 8760 hours.  CBG: No results for input(s): GLUCAP in the last 168 hours.  Radiological Exams on Admission: Dg Chest 2 View  10/11/2014   CLINICAL DATA:  Tykwon Fera of breath and weakness  EXAM: CHEST  2 VIEW  COMPARISON:  10/05/2014  FINDINGS: Right jugular Port-A-Cath is stable. Bilateral pleural effusions have increased. No pneumothorax. Cardiomegaly.  IMPRESSION: Increasing bilateral pleural effusions.   Electronically Signed   By: Marybelle Killings M.D.   On: 10/11/2014 08:22   Ct Angio Chest Pe W/cm &/or Wo Cm  10/11/2014   CLINICAL DATA:  Left-sided chest pain and shortness of breath. Reported history of carcinomatosis.  EXAM: CT ANGIOGRAPHY CHEST WITH CONTRAST  TECHNIQUE: Multidetector CT imaging of the chest was performed using the standard protocol during bolus administration of intravenous contrast. Multiplanar CT image reconstructions and MIPs were obtained to evaluate the vascular anatomy.  CONTRAST:  132mL OMNIPAQUE IOHEXOL 350 MG/ML SOLN  COMPARISON:  10/11/2014 chest radiograph, chest CT 10/05/2014  FINDINGS: Mediastinum/Nodes: Exam quality is suboptimal for evaluation for pulmonary embolism due to bolus timing and  inhomogeneity of the contrast bolus. Allowing for this, no large central filling defect is identified to suggest pulmonary arterial embolism.  Ascending aortic aneurysm measuring 4.8 cm image 58 is stable. Moderate atheromatous coronary arterial and aortic calcification. Small pericardial effusion is stable. No new lymphadenopathy.  Lungs/Pleura: Areas of subpleural patchy airspace opacity are noted, increased since previously in the left upper lobe anteriorly, for example image 55, as well as associated compressive atelectasis. Central airways are patent. No measurable nodule or mass.  Upper abdomen: Ascites is visualized.  Musculoskeletal: No acute osseous abnormality or lytic or sclerotic osseous lesion.  Review of the MIP images confirms the above findings.  IMPRESSION: Suboptimal exam quality for evaluation for pulmonary embolism without evidence for central large pulmonary arterial filling defect.  No significant change in 4.8 cm ascending aortic aneurysm.  Increased, now large, bilateral pleural effusions with associated compressive atelectasis.  Increased subpleural reticular airspace patchy opacities particularly in the left upper lobe anteriorly. These are nonspecific and could represent very early pneumonia but are amenable to followup at future presumed restaging studies.   Electronically Signed   By: Conchita Paris M.D.   On: 10/11/2014 10:27    EKG: Independently reviewed. NSR without acute ST segment elevations or T-wave inversions.  PAC.    Assessment/Plan Active Problems:   Anemia   Essential hypertension   Protein-calorie malnutrition, severe   Postoperative intra-abdominal abscess   Cellulitis of abdominal wall   Pleural effusion, bilateral   Bilateral pleural effusion  ---  Bilateral pleural effusions  with possible pneumonia on CT chest, but no fevers or increased cough and he has been on broad spectrum antibiotics until yesterday.  Most likely this is secondary to his  hypoalbuminemia from severe malnutrition or they are malignant pleural effusions.   -  Consider thoracentesis but he is not in respiratory distress or hypoxic and this may only drain his protein stores further -  Start IV lasix 20mg  IV BID  -  Daily weights and strict I/O -  Will try to discharge home as soon as he feels his breathing is more comfortable.    Abdominal abscess/ possible HCAP/ Abdominal Wall cellulitis, resolving.  - Continue cipro/flagyl for now until drain removed and abcess has resolved - BCx NGTD - Radiology on Tuesday for repeat CT scan, no drain injected needed. [id number]  Abdominal carcinomatosis with recent ex lap with small bowel bypass and colon resection by Dr. Excell Seltzer in May 2016, now with severe protein calorie malnutrition and intraabdominal abscess which precludes treatment at this time.  His prognosis is poor and he has had repeated admissions now in the last month.  He is partial code and would want a trial of bipap for respiratory distress or medications to correct arrhythmia such as adenosine/amiodarone, however, he would not want vasopressors, intubation, chest compressions or any any other sort of resuscitation if he became pulseless.   -Patient and wife seem clear about GOC at this time - Dr Learta Codding to follow  Severe protein calorie malnutrition. Regular diet with supplements as per previous nutrition recommendations  Recent demand ischemia with mildly elevated troponin secondary to sepsis. Cardiology recommended against further work up and patient states he would not want a catheterization anyway.    Hyponatremia, due to hypoalbuminemia, hypovolemia  Anemia of chronic disease, hemoglobin drifted down and stabilized around 8.2mg /dl. He did not require blood transfusion.  Diarrhea, chronic and related to his malignancy - C diff pcr was recently negative.   Ascending aortic aneurysm, 4.8 cm on CT chest. Currently the patient is too ill and  infected to undergo surgical repair.  Asymmetric lower extremity edema due to hypoalbuminemia (albumin 1.2)/anasarca - Duplex on 10/08/2014 was negative  GERD, continue carafate and maalox and PPI   Essential tremor, stable. Continue nightly primidone  Diet:  regular Access:  port IVF:  off Proph:  lovenox  Code Status: partial, see above Family Communication: patient and wife Disposition Plan: Admit to med-surg  Time spent: 60 min Janece Canterbury [facility] Pager [phone]  If 7PM-7AM, please contact night-coverage [email or web address] Password [facility] 10/11/2014, 10:55 AM

## 2014-10-11 NOTE — ED Notes (Signed)
Pt arrived to the ED with a complaint of shortness of breath.  Pt states he woke up around 0300 this am with left sided chest pain and shortness of breath.  Pt was recently discharged from the hospital where he has an intestinal abscess drained and presently has a drain in place.  Pt states he had a second episode of shortness of breath this morning.  Pt also is a cancer patient.  Last treatment was 6 weeks ago.

## 2014-10-12 ENCOUNTER — Telehealth: Payer: Self-pay | Admitting: Oncology

## 2014-10-12 ENCOUNTER — Other Ambulatory Visit: Payer: Self-pay | Admitting: *Deleted

## 2014-10-12 ENCOUNTER — Telehealth: Payer: Self-pay

## 2014-10-12 DIAGNOSIS — E43 Unspecified severe protein-calorie malnutrition: Secondary | ICD-10-CM

## 2014-10-12 DIAGNOSIS — L03311 Cellulitis of abdominal wall: Secondary | ICD-10-CM | POA: Diagnosis not present

## 2014-10-12 DIAGNOSIS — J90 Pleural effusion, not elsewhere classified: Secondary | ICD-10-CM | POA: Diagnosis not present

## 2014-10-12 DIAGNOSIS — T814XXD Infection following a procedure, subsequent encounter: Secondary | ICD-10-CM

## 2014-10-12 DIAGNOSIS — K651 Peritoneal abscess: Secondary | ICD-10-CM

## 2014-10-12 LAB — BASIC METABOLIC PANEL
Anion gap: 6 (ref 5–15)
BUN: 6 mg/dL (ref 6–20)
CHLORIDE: 100 mmol/L — AB (ref 101–111)
CO2: 24 mmol/L (ref 22–32)
Calcium: 7.4 mg/dL — ABNORMAL LOW (ref 8.9–10.3)
Creatinine, Ser: 0.62 mg/dL (ref 0.61–1.24)
GFR calc Af Amer: >60 mL/min (ref >60)
GFR calc non Af Amer: >60 mL/min (ref >60)
GLUCOSE: 104 mg/dL — AB (ref 65–99)
POTASSIUM: 3.8 mmol/L (ref 3.5–5.1)
Sodium: 130 mmol/L — ABNORMAL LOW (ref 135–145)

## 2014-10-12 LAB — URINE CULTURE: Culture: NO GROWTH

## 2014-10-12 LAB — CBC
HCT: 25.8 % — ABNORMAL LOW (ref 39.0–52.0)
Hemoglobin: 8.3 g/dL — ABNORMAL LOW (ref 13.0–17.0)
MCH: 26.9 pg (ref 26.0–34.0)
MCHC: 32.2 g/dL (ref 30.0–36.0)
MCV: 83.5 fL (ref 78.0–100.0)
PLATELETS: 396 10*3/uL (ref 150–400)
RBC: 3.09 MIL/uL — AB (ref 4.22–5.81)
RDW: 18.7 % — AB (ref 11.5–15.5)
WBC: 9.4 10*3/uL (ref 4.0–10.5)

## 2014-10-12 MED ORDER — LIDOCAINE 5 % EX PTCH: 1.0000 | MEDICATED_PATCH | CUTANEOUS | Status: DC

## 2014-10-12 MED ORDER — FUROSEMIDE 20 MG PO TABS: 20.0000 mg | ORAL_TABLET | Freq: Every day | ORAL | Status: DC

## 2014-10-12 MED ORDER — POTASSIUM CHLORIDE ER 10 MEQ PO TBCR: 10.0000 meq | EXTENDED_RELEASE_TABLET | Freq: Every day | ORAL | Status: DC

## 2014-10-12 MED ORDER — BOOST PLUS PO LIQD: 237.0000 mL | Freq: Two times a day (BID) | ORAL | Status: DC

## 2014-10-12 MED ORDER — FUROSEMIDE 10 MG/ML IJ SOLN
40.0000 mg | Freq: Two times a day (BID) | INTRAMUSCULAR | Status: DC
  Administered 2014-10-12: 40 mg via INTRAVENOUS
  Filled 2014-10-12: qty 4

## 2014-10-12 MED ORDER — ENSURE ENLIVE PO LIQD: 237.0000 mL | Freq: Two times a day (BID) | ORAL | Status: DC

## 2014-10-12 MED ORDER — SODIUM CHLORIDE 0.9 % IJ SOLN: 10.0000 mL | Freq: Every day | INTRAMUSCULAR | Status: DC

## 2014-10-12 NOTE — Progress Notes (Signed)
NUTRITION NOTE  Consult received. Pt on Regular diet and Megace order in place. Ensure Enlive order BID. Pt ate 100% of lunch. This RD saw pt at the end of lack week before d/c. He reports great appetite over the weekend and now; 100% of lunch consumed. Pt and wife state pt being d/c'ed today and he has already changed into his own clothes in anticipation of d/c.   No questions, concerns concerning nutrition from pt or wife at this time; they state that Megace helps very much.   Jarome Matin, RD, LDN Inpatient Clinical Dietitian Pager # [phone] After hours/weekend pager # [phone]

## 2014-10-12 NOTE — Telephone Encounter (Signed)
Wife left message that pt was dc home from hospital on Saturday and returned to ER on Sunday and was readmitted. He is in room [facility] room 1403. She is also concerned about a f/u appt when he gets released again.

## 2014-10-12 NOTE — Progress Notes (Signed)
POF sent to scheduler to schedule hospital f/u appt with Dr. [name] 10/13/14 at 4pm. Patient made aware of date/time and appreciated call.

## 2014-10-12 NOTE — Progress Notes (Signed)
[facility]  Patient Status: Active (receiving services up to time of hospitalization)  [facility] is providing the following services: PT  If patient discharges after hours, please call [phone].   Wesley Dillon 10/12/2014, 11:14 AM

## 2014-10-12 NOTE — Care Management Note (Signed)
Case Management Note  Patient Details  Name: JAVIUS SYLLA MRN: [id number] Date of Birth: March 02, 1946  Subjective/Objective:  69 y/o m admitted w/SOB.Bilateral pleural effusions.Readmit  6/13-6/18.Active w/[facility] HHPT.               Action/Plan:d/c plan home.   Expected Discharge Date:                 Expected Discharge Plan:  [place]  In-House Referral:     Discharge planning Services  CM Consult  Post Acute Care Choice:  Home Health ([facility]PT-[facility]) Choice offered to:     DME Arranged:    DME Agency:     HH Arranged:    [place] Agency:     Status of Service:  In process, will continue to follow  Medicare Important Message Given:    Date Medicare IM Given:    Medicare IM give by:    Date Additional Medicare IM Given:    Additional Medicare Important Message give by:     If discussed at [place] of Stay Meetings, dates discussed:    Additional Comments:  Dessa Phi, RN 10/12/2014, 11:02 AM

## 2014-10-12 NOTE — Telephone Encounter (Signed)
Added appt per pof...per orders pof pt aware °

## 2014-10-12 NOTE — Telephone Encounter (Signed)
Voicemail left by Dr. Sheran Fava 10/12/14. Patient was admitted over the weekend and is being discharged today. Per Dr. Sheran Fava patient will need labs within a week an an appointment with Dr. [name].

## 2014-10-12 NOTE — Discharge Summary (Signed)
Physician Discharge Summary  Wesley Dillon CZY:[id number] DOB: [birth year] DOA: 10/11/2014  PCP: Gerrit Heck, MD  Admit date: 10/11/2014 Discharge date: 10/12/2014  Recommendations for Outpatient Follow-up:  1. F/u with Dr. [name] in 1 week for repeat BMP to check potassium and creatinine. 2. Started daily lasix with potassium 3. Continued antibiotics for abscess 4. Continue drain flushed three times a day with 5-58mL of saline and daily dressing changes. Empty drain once daily and record output 5.  Follow up with Dr. Excell Seltzer at scheduled appointment in July 6. Follow up on Tuesday 6/28 at IR [facility] Radiology drain clinic for repeat CT scan. [id number] 7. Continue ciprofloxacin and flagyl until follow up at drain clinic. Family to ask IR and Surgery when he should be able to stop his course  Discharge Diagnoses:  Principal Problem:   Pleural effusion, bilateral Active Problems:   Anemia   Essential hypertension   Protein-calorie malnutrition, severe   Postoperative intra-abdominal abscess   Cellulitis of abdominal wall   Bilateral pleural effusion   Discharge Condition: stable, improved  Diet recommendation: regular with supplements  Wt Readings from Last 3 Encounters:  10/12/14 79.1 kg (174 lb 6.1 oz)  10/07/14 81.3 kg (179 lb 3.7 oz)  10/05/14 78.654 kg (173 lb 6.4 oz)    History of present illness:   The patient is a 69 y.o. year-old male with history of adenocarcinoma carcinomatosis, CAD, anemia, hypertension, intraabdominal abscess s/p drain placement on antibiotics, severe protein calorie malnutrition with multiple recent hospital admissions who presented with sudden onset chest pain and SOB. He was diagnosed with adenocarcinomatosis in 06/2014 and was undergoing treatment with FOLFOX by Dr. [name]. He was admitted in early May with SBO and again in mid-May for the same at which time he required ex-lap with resection of the terminal ileium and right  colon with small bowel bypass. He was discharged home on a regular diet and was readmitted a few weeks later with polymicrobial intraabdominal abscess and severe malnutrition. He had a drain placed by IR and was placed on broad spectrum antibiotics. He was discharged home the day prior to admission feeling deconditioned but otherwise well. That night, he developed sudden onset 4/10 pleuritic left chest pain without radiation but with increasing SOB. He had dyspnea at rest, worse with lying flat. CXR demonstrated increasing bilateral pleural effusions.  CT angio chest was negative for pulmonary embolism. His albumin is 1.2.  He was given a dose of IV lasix 20mg  and diuresed briskly.    Hospital Course:   Bilateral pleural effusions with possible pneumonia on CT chest, but no fevers or increased cough and he had been on broad spectrum antibiotics until the day prior to admission. Most likely this is secondary to his hypoalbuminemia from severe malnutrition or due to malignant pleural effusions. Considered diagnostic and therapeutic thoracentesis but he is not in respiratory distress or hypoxic and this may only drain his protein stores further.  Defer outpatient thoracentesis if Dr. [name] feels it may be helpful for prognosis.  He will continue antibiotics for his abdominal abscess for the next few weeks anyway. I started him on lasix 20mg  po once daily with potassium 84meq.    Abdominal abscess/ possible HCAP/ Abdominal Wall cellulitis, resolving. Continue cipro/flagyl for now until drain removed and abcess has resolved.  Radiology drain clinic on Tuesday 10/20/2014 for repeat CT scan, no drain.  Called [phone] and left a message for Anderson Malta to please schedule the appointment.    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 Excell Seltzer in May 2016, now with severe protein calorie malnutrition and intraabdominal abscess which precludes treatment  at this time. His prognosis is poor and he has had repeated admissions now in the last month. He is partial code and would want a trial of bipap for respiratory distress or medications to correct arrhythmia such as adenosine/amiodarone, however, he would not want vasopressors, intubation, chest compressions or any any other sort of resuscitation if he became pulseless. Patient and wife seem clear about GOC at this time.   Dr Learta Codding to follow up as outpatient and consider referral for palliative care/hospice.    Severe protein calorie malnutrition. Regular diet with supplements as per previous nutrition recommendations.  Recent demand ischemia with mildly elevated troponin secondary to sepsis. Cardiology recommended against further work up and patient states he would not want a catheterization anyway.   Hyponatremia, due to hypoalbuminemia, hypovolemia  Anemia of chronic disease, hemoglobin drifted down and stabilized around 8.3mg /dl. He did not require blood transfusion.  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  C diff pcr was recently negative.   Ascending aortic aneurysm, 4.8 cm on CT chest. Currently the patient is too ill and infected to undergo surgical repair.  Asymmetric lower extremity edema due to hypoalbuminemia (albumin 1.2)/anasarca.  Duplex on 10/08/2014 was negative  GERD, continue carafate and maalox and PPI   Essential tremor, stable. Continued nightly primidone  Procedures:  CT angio chest 6/19:  No PE, increasing pleural effusions  Consultations:  none  Discharge Exam: Filed Vitals:   10/12/14 0447  BP: 104/56  Pulse: 92  Temp: 98.1 F (36.7 C)  Resp: 18   Filed Vitals:   10/11/14 1100 10/11/14 1444 10/11/14 2040 10/12/14 0447  BP: 120/82 91/67 91/60  104/56  Pulse: 81 92 89 92  Temp:  97.6 F (36.4 C) 98.1 F (36.7 C) 98.1 F (36.7 C)  TempSrc:  Oral Oral Oral  Resp: 20 18 18 18   Height:      Weight:    79.1 kg (174 lb 6.1 oz)   SpO2: 96% 100% 94% 92%    General: cachectic male, appears older than stated age 41:  Temporal wasting, PERRL, anicteric, MMM Cardiovascular: RRR, no mrg, 2+ pulses Respiratory: Diminished at deep bases, but improved aeration, no rales, rhonchi, or wheeze ABD:  NABS, soft in upper quadrants, still full feeling in the lower quadrants with persistent erythema at the base of the abdomen, TTP lower quadrants without rebound or guarding MSK:  2+ bilateral lower extremity pitting edema, decreased bulk and tone  Discharge Instructions      Discharge Instructions    Call MD for:  difficulty breathing, headache or visual disturbances    Complete by:  As directed      Call MD for:  extreme fatigue    Complete by:  As directed      Call MD for:  hives    Complete by:  As directed      Call MD for:  persistant dizziness or light-headedness    Complete by:  As directed      Call MD for:  persistant nausea and vomiting    Complete by:  As directed      Call MD for:  redness, tenderness, or signs of infection (pain, swelling, redness, odor or green/yellow discharge around incision site)    Complete by:  As directed      Call MD for:  severe uncontrolled pain    Complete by:  As directed      Call MD for:  temperature >100.4    Complete by:  As directed      Diet general    Complete by:  As directed      Discharge instructions    Complete by:  As directed   You were hospitalized with difficulty breathing from bilateral pleural effusions.  These effusions are probably due to your low albumin stores (albumin is 1.2).  Please talk to Dr. [name] who may want a thoracentesis for diagnostic purposes, but at this time, I do not think it is urgent.  Thoracentesis, especially large volume, will only drain your protein stores even more.  I would like to try to get rid of the fluid with lasix.  Please take lasix 20mg  once daily with potassium 53meq once daily.  If you feel you are becoming dehydrated  or if you have vomiting and diarrhea, stop your lasix and potassium.  If you notice increased swelling, you may increase your dose to 2 tabs in the morning (lasix 40mg ) until you follow up with the oncology office.  If you double your dose of lasix, make sure you also double your dose of potassium.  Because your blood pressures are low, please stop your propranolol for now.  I have given you a prescription for supplements and for the lidoderm patch.  If you have increasing shortness of breath, pain, fevers, chills, or any other concerning symptoms, please return to the hospital right away or call 911.     Increase activity slowly    Complete by:  As directed             Medication List    STOP taking these medications        ibuprofen 200 MG tablet  Commonly known as:  ADVIL,MOTRIN     PRESCRIPTION MEDICATION     propranolol 10 MG tablet  Commonly known as:  INDERAL      TAKE these medications        acetaminophen 500 MG tablet  Commonly known as:  TYLENOL  Take 1,000 mg by mouth every 6 (six) hours as needed for moderate pain or headache.     ciprofloxacin 500 MG tablet  Commonly known as:  CIPRO  Take 1 tablet (500 mg total) by mouth 2 (two) times daily.     diphenoxylate-atropine 2.5-0.025 MG per tablet  Commonly known as:  LOMOTIL  Take 1 tablet by mouth 4 (four) times daily as needed for diarrhea or loose stools.     feeding supplement (ENSURE ENLIVE) Liqd  Take 237 mLs by mouth 2 (two) times daily between meals.     lactose free nutrition Liqd  Take 237 mLs by mouth 2 (two) times daily between meals.     fluconazole 100 MG tablet  Commonly known as:  DIFLUCAN  Take 1 tablet (100 mg total) by mouth daily.     furosemide 20 MG tablet  Commonly known as:  LASIX  Take 1 tablet (20 mg total) by mouth daily.     lidocaine 5 %  Commonly known as:  LIDODERM  Place 1 patch onto the skin daily. Remove & Discard patch within 12 hours or as directed by MD      lidocaine-prilocaine cream  Commonly known as:  EMLA  Apply 1 application topically as needed. Apply to PAC 1-2 hours prior to stick and cover with plastic wrap  megestrol 40 MG/ML suspension  Commonly known as:  MEGACE  Take 10 mLs (400 mg total) by mouth daily.     metroNIDAZOLE 500 MG tablet  Commonly known as:  FLAGYL  Take 1 tablet (500 mg total) by mouth 3 (three) times daily.     ondansetron 4 MG disintegrating tablet  Commonly known as:  ZOFRAN ODT  Take 1 tablet (4 mg total) by mouth every 4 (four) hours as needed for nausea or vomiting.     oxyCODONE 5 MG immediate release tablet  Commonly known as:  Oxy IR/ROXICODONE  Take 1-2 tablets (5-10 mg total) by mouth every 4 (four) hours as needed for severe pain.     pantoprazole 40 MG tablet  Commonly known as:  PROTONIX  Take 1 tablet (40 mg total) by mouth 2 (two) times daily.     potassium chloride 10 MEQ tablet  Commonly known as:  K-DUR  Take 1 tablet (10 mEq total) by mouth daily.     PRESERVISION AREDS PO  Take 2 capsules by mouth every morning.     primidone 250 MG tablet  Commonly known as:  MYSOLINE  Take 1 tablet (250 mg total) by mouth at bedtime.     prochlorperazine 10 MG tablet  Commonly known as:  COMPAZINE  Take 1 tablet (10 mg total) by mouth every 6 (six) hours as needed for nausea.     ranitidine 150 MG tablet  Commonly known as:  ZANTAC  Take 150 mg by mouth 2 (two) times daily.     saccharomyces boulardii 250 MG capsule  Commonly known as:  FLORASTOR  Take 1 capsule (250 mg total) by mouth 2 (two) times daily.     sodium chloride 0.9 % injection  Inject 10 mLs into the vein daily.     sucralfate 1 G tablet  Commonly known as:  CARAFATE  Take 1 tablet (1 g total) by mouth 4 (four) times daily -  with meals and at bedtime. Chew prior to swallowing       Follow-up Information    Schedule an appointment as soon as possible for a visit with Gerrit Heck, MD.   Specialty:   Family Medicine   Why:  As needed   Contact information:   [place] [address] [phone]       Follow up with [facility].   Why:  [phone]   Contact information:   appointment to be scheduled on 10/20/2014      Follow up with Betsy Coder, MD. Schedule an appointment as soon as possible for a visit in 1 week.   Specialty:  Oncology   Contact information:   [place] [address] [phone]       Follow up with Edward Jolly, MD On 10/30/2014.   Specialty:  General Surgery   Why:  11:30AM   Contact information:   [address] [place] [address] [phone]        The results of significant diagnostics from this hospitalization (including imaging, microbiology, ancillary and laboratory) are listed below for reference.    Significant Diagnostic Studies: Dg Chest 2 View  10/11/2014   CLINICAL DATA:  Samiah Ricklefs of breath and weakness  EXAM: CHEST  2 VIEW  COMPARISON:  10/05/2014  FINDINGS: Right jugular Port-A-Cath is stable. Bilateral pleural effusions have increased. No pneumothorax. Cardiomegaly.  IMPRESSION: Increasing bilateral pleural effusions.   Electronically Signed   By: Marybelle Killings  M.D.   On: 10/11/2014 08:22   Dg Chest 2 View  10/05/2014   CLINICAL DATA:  Fever and shortness of breath beginning today. History of colon carcinoma.  EXAM: CHEST  2 VIEW  COMPARISON:  Single view of the chest 08/18/2014.  FINDINGS: Port-A-Cath is again seen. There are small to moderate bilateral pleural effusions, greater on the right. Basilar airspace disease is also worse on the right. No pneumothorax is identified. Heart size is normal.  IMPRESSION: Right greater than left small to moderate bilateral pleural effusions with associated basilar airspace disease, likely atelectasis.   Electronically Signed   By: Inge Rise M.D.   On: 10/05/2014 14:28   Ct Angio Chest Pe W/cm &/or Wo Cm  10/11/2014   CLINICAL DATA:   Left-sided chest pain and shortness of breath. Reported history of carcinomatosis.  EXAM: CT ANGIOGRAPHY CHEST WITH CONTRAST  TECHNIQUE: Multidetector CT imaging of the chest was performed using the standard protocol during bolus administration of intravenous contrast. Multiplanar CT image reconstructions and MIPs were obtained to evaluate the vascular anatomy.  CONTRAST:  136mL OMNIPAQUE IOHEXOL 350 MG/ML SOLN  COMPARISON:  10/11/2014 chest radiograph, chest CT 10/05/2014  FINDINGS: Mediastinum/Nodes: Exam quality is suboptimal for evaluation for pulmonary embolism due to bolus timing and inhomogeneity of the contrast bolus. Allowing for this, no large central filling defect is identified to suggest pulmonary arterial embolism.  Ascending aortic aneurysm measuring 4.8 cm image 58 is stable. Moderate atheromatous coronary arterial and aortic calcification. Small pericardial effusion is stable. No new lymphadenopathy.  Lungs/Pleura: Areas of subpleural patchy airspace opacity are noted, increased since previously in the left upper lobe anteriorly, for example image 55, as well as associated compressive atelectasis. Central airways are patent. No measurable nodule or mass.  Upper abdomen: Ascites is visualized.  Musculoskeletal: No acute osseous abnormality or lytic or sclerotic osseous lesion.  Review of the MIP images confirms the above findings.  IMPRESSION: Suboptimal exam quality for evaluation for pulmonary embolism without evidence for central large pulmonary arterial filling defect.  No significant change in 4.8 cm ascending aortic aneurysm.  Increased, now large, bilateral pleural effusions with associated compressive atelectasis.  Increased subpleural reticular airspace patchy opacities particularly in the left upper lobe anteriorly. These are nonspecific and could represent very early pneumonia but are amenable to followup at future presumed restaging studies.   Electronically Signed   By: Conchita Paris  M.D.   On: 10/11/2014 10:27   Ct Angio Chest Pe W/cm &/or Wo Cm  10/05/2014   CLINICAL DATA:  Acute onset of generalized abdominal pain and weakness. Hypotension. Diarrhea. Hypoxia. Significantly elevated D-dimer. Current history of adenocarcinoma with carcinomatosis, status post chemotherapy. Initial encounter.  EXAM: CT ANGIOGRAPHY CHEST  CT ABDOMEN AND PELVIS WITH CONTRAST  TECHNIQUE: Multidetector CT imaging of the chest was performed using the standard protocol during bolus administration of intravenous contrast. Multiplanar CT image reconstructions and MIPs were obtained to evaluate the vascular anatomy. Multidetector CT imaging of the abdomen and pelvis was performed using the standard protocol during bolus administration of intravenous contrast.  CONTRAST:  154mL OMNIPAQUE IOHEXOL 350 MG/ML SOLN  COMPARISON:  Abdominal radiographs performed earlier today at 2:12 p.m., and CT of the abdomen and pelvis performed 09/08/2014. CT of the chest performed 03/16/2014  FINDINGS: CTA CHEST FINDINGS  There is no evidence of pulmonary embolus.  Moderate bilateral pleural effusions are seen, with underlying partial consolidation of both lower lung lobes. The expanded portions of both lungs appear relatively  clear. There is no evidence of pneumothorax. No masses are identified; no abnormal focal contrast enhancement is seen.  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 The great vessels are grossly unremarkable in appearance. No pericardial effusion is identified. No definite mediastinal lymphadenopathy is seen.  Scattered coronary artery calcifications are noted. There is suggestion of mild diffuse wall thickening along the esophagus, with associated edema, raising concern for some degree of esophagitis. No axillary lymphadenopathy is seen. The thyroid gland is unremarkable in appearance. A right-sided chest port is noted ending  about the distal SVC.  No acute osseous abnormalities are seen.  CT ABDOMEN and PELVIS FINDINGS  There appears to be a very large peripherally enhancing evolving abscess at the right side of the abdomen, partially contiguous with additional collections of fluid throughout the remainder of the abdomen and pelvis. This measures approximately 30.9 x 12.2 x 20.0 cm, extending from the level of the liver down into the pelvis. This contains a large amount of fluid and air. Given that the surgery was a month ago, this is unlikely to reflect residual air. There is no evidence of extravasation of contrast into the collection to suggest a perforated viscus, though it cannot be excluded; contrast appears to extend normally through the visualized bowel anastomoses and into the sigmoid colon. A gas-producing organism is thought to be the most likely etiology of the air.  Underlying moderate to large volume ascites is noted within the abdomen and pelvis; the underlying ascites has lower attenuation than the fluid within the abscess.  There appears to be prominence of intrahepatic biliary ducts and underlying periportal edema, though the common bile duct is not definitely enlarged. This is of uncertain significance. 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 The spleen is unremarkable in appearance. The superior aspect of the abscess demonstrates some degree of mass effect on the inferior aspect of the liver. A likely small stone is noted along the lateral gallbladder wall. The gallbladder is otherwise grossly unremarkable. The pancreas and adrenal glands are unremarkable.  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 The stomach is grossly unremarkable in appearance, though difficult to fully assess at the level of the hepatic hilum.  A 2.2 cm cyst is noted at the upper pole of the left kidney. The kidneys are otherwise unremarkable. There is no evidence of hydronephrosis. No renal or ureteral stones are seen.  No perinephric stranding is appreciated.  There is vague diffuse persistent or recurrent edema involving multiple small bowel loops at the mid and lower abdomen. There is also diffuse nodular mucosal thickening along the remaining ileum extending to the level of the patient's ileocolic anastomosis, and more mild mucosal edema along the distal descending and sigmoid colon. Some of these bowel loops are largely surrounded by the abscess. This may reflect lymphatic congestion, ischemia or infection. Underlying tumor infiltration cannot be excluded, as previously noted. The edematous bowel loops still demonstrate minimal enhancement, without evidence for dead bowel at this time.  No acute vascular abnormalities are seen. Mild scattered calcification is noted along the abdominal aorta and its branches.  The bladder is mildly distended and grossly unremarkable in appearance. The prostate is enlarged, measuring 5.5 cm in transverse dimension. No inguinal lymphadenopathy is seen.  No acute osseous abnormalities are identified.  Review of the MIP images confirms the above findings.  IMPRESSION: 1. Very large peripherally enhancing evolving abscess at  the right side of the abdomen, partially contiguous with additional collections of fluid throughout the remainder of the abdomen and pelvis. This measures 30.9 x 12.2 x 20.0 cm, extending from the level of the liver down into the pelvis, and contains a large amount of fluid and air. No evidence of extravasation of contrast into the collection to suggest a perforated viscus, though it cannot be excluded. Contrast appears to extend normally through the visualized bowel anastomoses. A gas-producing organism is thought to be the most likely etiology of the air. Given the size of the abscess and surrounding ascites, this is likely too large for successful interventional drainage. Surgical consultation is suggested, as deemed clinically appropriate. 2. Underlying moderate to large  volume ascites within the abdomen and pelvis. 3. Vague persistent or recurrent diffuse edema involving multiple small bowel loops at the mid and lower abdomen. Diffuse nodular mucosal thickening along the remaining ileum extending to the ileocolic anastomosis, and more mild mucosal edema along the distal descending and sigmoid colon. This may reflect lymphatic congestion, ischemia or infection. As previously noted, underlying tumor infiltration cannot be excluded. The edematous bowel loops still demonstrate minimal enhancement, without definite dead bowel at this time. 4. No evidence of pulmonary embolus. 5. Moderate bilateral pleural effusions, with underlying partial consolidation of both lower lung lobes. 6. Suggestion of mild diffuse wall thickening along the esophagus, with associated edema, raising concern for some degree of esophagitis. 7. Prominence of the intrahepatic biliary ducts and underlying periportal edema, somewhat more prominent than on the prior study, though the common bile duct is not definitely enlarged. This is of uncertain significance, though it could reflect some degree of obstruction at the hepatic hilum. Underlying cavernous transformation of the portal vein noted. 8. 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 9. 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 If and when deemed clinically appropriate, would recommend semi-annual imaging followup by CTA or MRA, with elective referral to cardiothoracic surgery if not already obtained. This recommendation follows 2010 ACCF/AHA/AATS/ACR/ASA/SCA/SCAI/SIR/STS/SVM Guidelines for the Diagnosis and Management of Patients With Thoracic Aortic Disease. Circulation. 2010; 121: N277-O242 10. Scattered coronary artery calcifications seen. 11. Cholelithiasis; gallbladder otherwise unremarkable. 12. Small left renal cyst noted. 13. Mild scattered calcification along the abdominal aorta and its branches. 14. Enlarged  prostate noted.  These results were called by telephone at the time of interpretation on 10/05/2014 at 6:50 pm to Dr. Hosie Poisson, who verbally acknowledged these results.   Electronically Signed   By: Garald Balding M.D.   On: 10/05/2014 19:11   Ct Abdomen Pelvis W Contrast  10/05/2014   CLINICAL DATA:  Acute onset of generalized abdominal pain and weakness. Hypotension. Diarrhea. Hypoxia. Significantly elevated D-dimer. Current history of adenocarcinoma with carcinomatosis, status post chemotherapy. Initial encounter.  EXAM: CT ANGIOGRAPHY CHEST  CT ABDOMEN AND PELVIS WITH CONTRAST  TECHNIQUE: Multidetector CT imaging of the chest was performed using the standard protocol during bolus administration of intravenous contrast. Multiplanar CT image reconstructions and MIPs were obtained to evaluate the vascular anatomy. Multidetector CT imaging of the abdomen and pelvis was performed using the standard protocol during bolus administration of intravenous contrast.  CONTRAST:  155mL OMNIPAQUE IOHEXOL 350 MG/ML SOLN  COMPARISON:  Abdominal radiographs performed earlier today at 2:12 p.m., and CT of the abdomen and pelvis performed 09/08/2014. CT of the chest performed 03/16/2014  FINDINGS: CTA CHEST FINDINGS  There is no evidence of pulmonary embolus.  Moderate bilateral pleural effusions are seen,  with underlying partial consolidation of both lower lung lobes. The expanded portions of both lungs appear relatively clear. There is no evidence of pneumothorax. No masses are identified; no abnormal focal contrast enhancement is seen.  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 The great vessels are grossly unremarkable in appearance. No pericardial effusion is identified. No definite mediastinal lymphadenopathy is seen.  Scattered coronary artery calcifications are noted. There is suggestion of mild diffuse wall thickening along the  esophagus, with associated edema, raising concern for some degree of esophagitis. No axillary lymphadenopathy is seen. The thyroid gland is unremarkable in appearance. A right-sided chest port is noted ending about the distal SVC.  No acute osseous abnormalities are seen.  CT ABDOMEN and PELVIS FINDINGS  There appears to be a very large peripherally enhancing evolving abscess at the right side of the abdomen, partially contiguous with additional collections of fluid throughout the remainder of the abdomen and pelvis. This measures approximately 30.9 x 12.2 x 20.0 cm, extending from the level of the liver down into the pelvis. This contains a large amount of fluid and air. Given that the surgery was a month ago, this is unlikely to reflect residual air. There is no evidence of extravasation of contrast into the collection to suggest a perforated viscus, though it cannot be excluded; contrast appears to extend normally through the visualized bowel anastomoses and into the sigmoid colon. A gas-producing organism is thought to be the most likely etiology of the air.  Underlying moderate to large volume ascites is noted within the abdomen and pelvis; the underlying ascites has lower attenuation than the fluid within the abscess.  There appears to be prominence of intrahepatic biliary ducts and underlying periportal edema, though the common bile duct is not definitely enlarged. This is of uncertain significance. 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 The spleen is unremarkable in appearance. The superior aspect of the abscess demonstrates some degree of mass effect on the inferior aspect of the liver. A likely small stone is noted along the lateral gallbladder wall. The gallbladder is otherwise grossly unremarkable. The pancreas and adrenal glands are unremarkable.  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 The stomach is grossly unremarkable in appearance, though difficult to fully assess at the  level of the hepatic hilum.  A 2.2 cm cyst is noted at the upper pole of the left kidney. The kidneys are otherwise unremarkable. There is no evidence of hydronephrosis. No renal or ureteral stones are seen. No perinephric stranding is appreciated.  There is vague diffuse persistent or recurrent edema involving multiple small bowel loops at the mid and lower abdomen. There is also diffuse nodular mucosal thickening along the remaining ileum extending to the level of the patient's ileocolic anastomosis, and more mild mucosal edema along the distal descending and sigmoid colon. Some of these bowel loops are largely surrounded by the abscess. This may reflect lymphatic congestion, ischemia or infection. Underlying tumor infiltration cannot be excluded, as previously noted. The edematous bowel loops still demonstrate minimal enhancement, without evidence for dead bowel at this time.  No acute vascular abnormalities are seen. Mild scattered calcification is noted along the abdominal aorta and its branches.  The bladder is mildly distended and grossly unremarkable in appearance. The prostate is enlarged, measuring 5.5 cm in transverse dimension. No inguinal lymphadenopathy is seen.  No acute osseous abnormalities are identified.  Review of  the MIP images confirms the above findings.  IMPRESSION: 1. Very large peripherally enhancing evolving abscess at the right side of the abdomen, partially contiguous with additional collections of fluid throughout the remainder of the abdomen and pelvis. This measures 30.9 x 12.2 x 20.0 cm, extending from the level of the liver down into the pelvis, and contains a large amount of fluid and air. No evidence of extravasation of contrast into the collection to suggest a perforated viscus, though it cannot be excluded. Contrast appears to extend normally through the visualized bowel anastomoses. A gas-producing organism is thought to be the most likely etiology of the air. Given the size of  the abscess and surrounding ascites, this is likely too large for successful interventional drainage. Surgical consultation is suggested, as deemed clinically appropriate. 2. Underlying moderate to large volume ascites within the abdomen and pelvis. 3. Vague persistent or recurrent diffuse edema involving multiple small bowel loops at the mid and lower abdomen. Diffuse nodular mucosal thickening along the remaining ileum extending to the ileocolic anastomosis, and more mild mucosal edema along the distal descending and sigmoid colon. This may reflect lymphatic congestion, ischemia or infection. As previously noted, underlying tumor infiltration cannot be excluded. The edematous bowel loops still demonstrate minimal enhancement, without definite dead bowel at this time. 4. No evidence of pulmonary embolus. 5. Moderate bilateral pleural effusions, with underlying partial consolidation of both lower lung lobes. 6. Suggestion of mild diffuse wall thickening along the esophagus, with associated edema, raising concern for some degree of esophagitis. 7. Prominence of the intrahepatic biliary ducts and underlying periportal edema, somewhat more prominent than on the prior study, though the common bile duct is not definitely enlarged. This is of uncertain significance, though it could reflect some degree of obstruction at the hepatic hilum. Underlying cavernous transformation of the portal vein noted. 8. 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 9. 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 If and when deemed clinically appropriate, would recommend semi-annual imaging followup by CTA or MRA, with elective referral to cardiothoracic surgery if not already obtained. This recommendation follows 2010 ACCF/AHA/AATS/ACR/ASA/SCA/SCAI/SIR/STS/SVM Guidelines for the Diagnosis and Management of Patients With Thoracic Aortic Disease. Circulation. 2010; 121: B284-X324 10. Scattered coronary  artery calcifications seen. 11. Cholelithiasis; gallbladder otherwise unremarkable. 12. Small left renal cyst noted. 13. Mild scattered calcification along the abdominal aorta and its branches. 14. Enlarged prostate noted.  These results were called by telephone at the time of interpretation on 10/05/2014 at 6:50 pm to Dr. Hosie Poisson, who verbally acknowledged these results.   Electronically Signed   By: Garald Balding M.D.   On: 10/05/2014 19:11   Dg Abd 2 Views  10/08/2014   CLINICAL DATA:  Recent small bowel obstruction, known abdominal carcinomatosis  EXAM: ABDOMEN - 2 VIEW  COMPARISON:  10/05/2014  FINDINGS: Scattered large and small bowel gas is noted. A drainage catheter is noted in the right mid abdomen. Bibasilar atelectatic changes with associated effusions are noted. No definitive free air is seen. No true obstructive changes are noted.  IMPRESSION: Bibasilar atelectasis and associated effusions similar to that seen on recent CT examination.  Right mid abdominal drainage catheter in satisfactory position.  No true obstructive changes are noted at this time.   Electronically Signed   By: Inez Catalina M.D.   On: 10/08/2014 15:34   Dg Abd 2 Views  10/05/2014   CLINICAL DATA:  Fever. Shortness of breath. Colostomy revision and bowel resection  may 2016.  EXAM: ABDOMEN - 2 VIEW  COMPARISON:  09/08/2014  FINDINGS: Airspace opacities probably from pleural effusions of the lung bases.  Nondependent collection of gas in the right abdomen with air-fluid level on the left-side-down lateral decubitus views ; this does not track around the inferior liver edge, and I do not see regular lines on the frontal projection, accordingly this may be gas within right-sided bowel, but I am not absolutely certain.  IMPRESSION: 1. Bibasilar pleural effusions with associated atelectasis/airspace opacity. 2. Air- fluid level non-dependently in the abdomen on the lateral decubitus view. The gas does not appear to track along  the liver edge and accordingly is probably within dilated bowel, but I am not 989% certain that this is intraluminal. CT abdomen recommended. These results will be called to the ordering clinician or representative by the Radiologist Assistant, and communication documented in the PACS or zVision Dashboard.   Electronically Signed   By: Van Clines M.D.   On: 10/05/2014 14:34   Ct Image Guided Drainage Percut Cath  Peritoneal Retroperit  10/06/2014   CLINICAL DATA:  Abdominal carcinomatosis post laparotomy for bowel obstruction. Now presents with large intra-abdominal abscess.  EXAM: CT GUIDED DRAINAGE OF PERITONEAL ABSCESS  ANESTHESIA/SEDATION: Intravenous Versed were administered as conscious sedation during continuous cardiorespiratory monitoring by the ICU RN, with a total moderate sedation time of less than 30 minutes.  PROCEDURE: The procedure, risks, benefits, and alternatives were explained to the patient. Questions regarding the procedure were encouraged and answered. The patient understands and consents to the procedure.  Select axial scans through the abdomen were obtained. The collection was localized and an appropriate skin entry site was determined and marked.  The operative field was prepped with Betadinein a sterile fashion, and a sterile drape was applied covering the operative field. A sterile gown and sterile gloves were used for the procedure. Local anesthesia was provided with 1% Lidocaine.  Under CT fluoroscopic guidance, a 19 gauge percutaneous entry needle was advanced into the collection. Purulent material returned. An Amplatz guidewire advanced easily into the collection. Tract dilated to allow placement of a 12 French pigtail catheter, placed within the central aspect of the collection. Catheter position confirmed on CT. Catheter secured externally with 0 Prolene suture and StatLock and placed to gravity bag. 1700 mL of fluid were removed during the procedure. A sample sent for  Gram stain, culture and sensitivity.  COMPLICATIONS: None immediate  FINDINGS: Limited CT confirms large gas and fluid loculated collection in the right peritoneal cavity. 12 French abscess drain catheter placed under CT guidance.  IMPRESSION: 1. Technically successful CT-guided peritoneal abscess drain catheter placement.   Electronically Signed   By: Lucrezia Europe M.D.   On: 10/06/2014 09:20    Microbiology: Recent Results (from the past 240 hour(s))  Blood culture (routine x 2)     Status: None   Collection Time: 10/05/14  1:53 PM  Result Value Ref Range Status   Specimen Description BLOOD LEFT ANTECUBITAL  Final   Special Requests BOTTLES DRAWN AEROBIC AND ANAEROBIC 5CC EACH  Final   Culture   Final    NO GROWTH 5 DAYS Performed at [facility]    Report Status 10/11/2014 FINAL  Final  Blood culture (routine x 2)     Status: None   Collection Time: 10/05/14  1:55 PM  Result Value Ref Range Status   Specimen Description BLOOD RIGHT ANTECUBITAL  Final   Special Requests BOTTLES DRAWN AEROBIC AND ANAEROBIC  [facility] EACH  Final   Culture   Final    NO GROWTH 5 DAYS Performed at [facility]    Report Status 10/11/2014 FINAL  Final  Clostridium Difficile by PCR (not at [facility])     Status: None   Collection Time: 10/05/14  7:45 PM  Result Value Ref Range Status   C difficile by pcr NEGATIVE NEGATIVE Final  Culture, routine-abscess     Status: None   Collection Time: 10/05/14  9:57 PM  Result Value Ref Range Status   Specimen Description DRAINAGE  Final   Special Requests Normal  Final   Gram Stain   Final    FEW WBC PRESENT, PREDOMINANTLY PMN NO SQUAMOUS EPITHELIAL CELLS SEEN ABUNDANT GRAM NEGATIVE RODS ABUNDANT GRAM POSITIVE COCCI IN PAIRS FEW GRAM POSITIVE RODS    Culture   Final    MULTIPLE ORGANISMS PRESENT, NONE PREDOMINANT Note: NO STAPHYLOCOCCUS AUREUS ISOLATED NO GROUP A STREP (S.PYOGENES) ISOLATED Performed at [facility]    Report Status 10/09/2014  FINAL  Final     Labs: Basic Metabolic Panel:  Recent Labs Lab 10/06/14 0545 10/07/14 0537 10/08/14 0435 10/09/14 0440 10/10/14 0450 10/11/14 0803 10/12/14 0427  NA 127* 128* 134* 131* 131* 131* 130*  K 3.1* 3.9 4.5 4.6 5.3* 4.3 3.8  CL 98* 103 106 103 103 101 100*  CO2 21* 20* 22 23 22 24 24   GLUCOSE 116* 97 87 81 85 96 104*  BUN 9 8 <5* <5* 6 6 6   CREATININE 0.43* 0.54* 0.51* 0.56* 0.60* 0.63 0.62  CALCIUM 6.9* 6.7* 7.2* 7.2* 7.2* 7.4* 7.4*  MG 1.4* 1.8  --   --   --   --   --    Liver Function Tests:  Recent Labs Lab 10/06/14 0545  AST 18  ALT 16*  ALKPHOS 114  BILITOT 0.5  PROT 5.1*  ALBUMIN 1.2*   No results for input(s): LIPASE, AMYLASE in the last 168 hours. No results for input(s): AMMONIA in the last 168 hours. CBC:  Recent Labs Lab 10/05/14 1350  10/08/14 0435 10/09/14 0440 10/10/14 0450 10/11/14 0803 10/12/14 0427  WBC 20.5*  < > 11.5* 10.3 11.4* 9.6 9.4  NEUTROABS 19.1*  --   --   --   --  7.0  --   HGB 8.6*  9.9*  < > 8.1* 7.8* 8.2* 8.7* 8.3*  HCT 26.3*  29.0*  < > 26.4* 25.1* 25.4* 27.9* 25.8*  MCV 83.8  < > 83.8 83.9 84.7 84.0 83.5  PLT 434*  < > 402* 363 474* 408* 396  < > = values in this interval not displayed. Cardiac Enzymes:  Recent Labs Lab 10/11/14 0803  TROPONINI 0.03   BNP: BNP (last 3 results)  Recent Labs  10/06/14 0545 10/11/14 0802  BNP 574.2* 223.3*    ProBNP (last 3 results) No results for input(s): PROBNP in the last 8760 hours.  CBG: No results for input(s): GLUCAP in the last 168 hours.  Time coordinating discharge: 35 minutes  Signed:  Mayari Matus  [facility] 10/12/2014, 12:12 PM

## 2014-10-12 NOTE — Telephone Encounter (Signed)
Ok, please let him know I will see him in AM

## 2014-10-13 ENCOUNTER — Other Ambulatory Visit: Payer: Self-pay | Admitting: Radiology

## 2014-10-13 ENCOUNTER — Other Ambulatory Visit (HOSPITAL_COMMUNITY): Payer: Self-pay | Admitting: Interventional Radiology

## 2014-10-13 ENCOUNTER — Ambulatory Visit (HOSPITAL_BASED_OUTPATIENT_CLINIC_OR_DEPARTMENT_OTHER): Payer: 59 | Admitting: Oncology

## 2014-10-13 ENCOUNTER — Ambulatory Visit: Payer: 59

## 2014-10-13 VITALS — BP 103/65 | HR 90 | Temp 98.2°F | Resp 18 | Ht 73.0 in | Wt 179.6 lb

## 2014-10-13 DIAGNOSIS — K651 Peritoneal abscess: Principal | ICD-10-CM

## 2014-10-13 DIAGNOSIS — D6481 Anemia due to antineoplastic chemotherapy: Secondary | ICD-10-CM | POA: Diagnosis not present

## 2014-10-13 DIAGNOSIS — IMO0001 Reserved for inherently not codable concepts without codable children: Secondary | ICD-10-CM

## 2014-10-13 DIAGNOSIS — G893 Neoplasm related pain (acute) (chronic): Secondary | ICD-10-CM

## 2014-10-13 DIAGNOSIS — T814XXS Infection following a procedure, sequela: Principal | ICD-10-CM

## 2014-10-13 DIAGNOSIS — T814XXA Infection following a procedure, initial encounter: Principal | ICD-10-CM

## 2014-10-13 DIAGNOSIS — D509 Iron deficiency anemia, unspecified: Secondary | ICD-10-CM

## 2014-10-13 DIAGNOSIS — E46 Unspecified protein-calorie malnutrition: Secondary | ICD-10-CM

## 2014-10-13 DIAGNOSIS — C801 Malignant (primary) neoplasm, unspecified: Secondary | ICD-10-CM | POA: Diagnosis not present

## 2014-10-13 NOTE — Progress Notes (Signed)
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 He was diagnosed with an abdominal abscess and underwent percutaneous drainage. His clinical status improved with drainage and antibiotics. He was discharged on 10/10/2014 to complete outpatient ciprofloxacin and Flagyl. He was readmitted on 10/11/2014 with dyspnea. A CT of the chest revealed bilateral pleural effusions. This was felt to be related to hypo-albuminemia. He reports improvement following furosemide diuresis. He was discharged on 20 mg of Lasix daily. The 20 mg dose of Lasix has not helped. He reports marked improvement in his appetite with Megace. He denies nausea and pain. Diarrhea is controlled with Lomotil. He is ambulating in the home. Esophagitis symptoms have resolved.  Objective:  Vital signs in last 24 hours:  Blood pressure 103/65, pulse 90, temperature 98.2 F (36.8 C), temperature source Oral, resp. rate 18, height 6\' 1"  (1.854 m), weight 179 lb 9.6 oz (81.466 kg), SpO2 99 %.    HEENT: No thrush Resp: Decreased breath sounds at the lower chest bilaterally, no respiratory distress Cardio: Regular rate and rhythm GI: Mildly distended, right lower quadrant drain site without evidence of infection, nontender, no mass Vascular: 2+ pitting edema at the lower leg and foot bilaterally  Skin: Anasarca with pitting edema at the waistline   Portacath/PICC-without erythema  Lab Results:  Lab Results  Component Value Date   WBC 9.4 10/12/2014   HGB 8.3* 10/12/2014   HCT 25.8* 10/12/2014   MCV 83.5 10/12/2014   PLT 396 10/12/2014   NEUTROABS 7.0 10/11/2014      Imaging:  Dg Chest 2 View  10/11/2014   CLINICAL DATA:  Short of breath and weakness  EXAM: CHEST  2 VIEW  COMPARISON:  10/05/2014  FINDINGS: Right jugular Port-A-Cath is stable. Bilateral pleural  effusions have increased. No pneumothorax. Cardiomegaly.  IMPRESSION: Increasing bilateral pleural effusions.   Electronically Signed   By: Marybelle Killings M.D.   On: 10/11/2014 08:22   Ct Angio Chest Pe W/cm &/or Wo Cm  10/11/2014   CLINICAL DATA:  Left-sided chest pain and shortness of breath. Reported history of carcinomatosis.  EXAM: CT ANGIOGRAPHY CHEST WITH CONTRAST  TECHNIQUE: Multidetector CT imaging of the chest was performed using the standard protocol during bolus administration of intravenous contrast. Multiplanar CT image reconstructions and MIPs were obtained to evaluate the vascular anatomy.  CONTRAST:  149mL OMNIPAQUE IOHEXOL 350 MG/ML SOLN  COMPARISON:  10/11/2014 chest radiograph, chest CT 10/05/2014  FINDINGS: Mediastinum/Nodes: Exam quality is suboptimal for evaluation for pulmonary embolism due to bolus timing and inhomogeneity of the contrast bolus. Allowing for this, no large central filling defect is identified to suggest pulmonary arterial embolism.  Ascending aortic aneurysm measuring 4.8 cm image 58 is stable. Moderate atheromatous coronary arterial and aortic calcification. Small pericardial effusion is stable. No new lymphadenopathy.  Lungs/Pleura: Areas of subpleural patchy airspace opacity are noted, increased since previously in the left upper lobe anteriorly, for example image 55, as well as associated compressive atelectasis. Central airways are patent. No measurable nodule or mass.  Upper abdomen: Ascites is visualized.  Musculoskeletal: No acute osseous abnormality or lytic or sclerotic osseous lesion.  Review of the MIP images confirms the above findings.  IMPRESSION: Suboptimal exam quality for evaluation for pulmonary embolism without evidence for central large pulmonary arterial filling defect.  No significant change in 4.8 cm ascending aortic aneurysm.  Increased, now large, bilateral pleural effusions with associated compressive atelectasis.  Increased subpleural reticular  airspace patchy opacities particularly in the left upper lobe anteriorly. These are nonspecific and could represent very early pneumonia but are amenable to followup at future presumed restaging studies.   Electronically Signed   By: Conchita Paris M.D.   On: 10/11/2014 10:27    Medications: I have reviewed the patient's current medications.  Assessment/Plan: 1. 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  CT 06/30/2014 consistent with a partial small bowel obstruction and abdominal carcinomatosis  Omentum biopsy 07/01/2014 confirmed adenocarcinoma, nonspecific staining pattern  PET scan 07/07/2014 with multiple areas of hypermetabolic bowel wall thickening consistent with serosal implants from carcinomatosis, thickening and hypermetabolic activity at the terminal ileum felt to potentially represent a primary neoplasm, solitary right liver metastasis, hypermetabolic nodular lung lesions  Cycle 1 FOLFOX 07/15/2014  Cycle 2 FOLFOX 07/28/2014  Cycle 3 FOLFOX 08/11/2014  Cycle 4 FOLFOX 08/25/2014  2. Abdominal pain secondary to #1, improved 3. Iron deficiency, Hemoccult positive stool 4. Partial small bowel obstruction-recurrent bowel obstruction symptoms requiring hospital admission 08/18/2014 5. Bronchiectasis 6. 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 Nausea following cycle 1 FOLFOX, Emend and Aloxi added with cycle 2 8. Neutropenia secondary to chemotherapy-Neulasta will be added with cycle 4 FOLFOX 9. Admission 09/06/2014 with a small bowel obstruction, status post resection of the terminal ileum/right colon and small bowel bypass 09/09/2014  Pathology confirmed diffuse involvement of the small bowel, large bowel, and appendix with adenocarcinoma-no primary tumor site found 10. Anemia secondary to chronic disease, chemotherapy, and recent surgery  2 units packed red blood cells 09/23/2014  11. Malnutrition 12.  Admission 10/05/2014 with an abdominal abscess, status post percutaneous drainage 13. Frequent loose bowel movements following in the right colectomy and small bowel bypass, improved with Lomotil 14.  Bilateral pleural effusions-likely secondary to anasarca 15.  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 He has anasarca. Hopefully the bilateral pleural effusions are related to anasarca as opposed to malignant effusions. He noted improvement with IV Lasix in the hospital earlier this week. 20 g of oral Lasix has not caused a diuresis. We decided to increase the Lasix to 60 mg daily. He will also increase the potassium dose. He will return for an office visit and chemistry panel on 10/19/2014.  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  He agrees to a home palliative care consult. Betsy Coder, MD  10/13/2014  4:46 PM

## 2014-10-14 ENCOUNTER — Telehealth: Payer: Self-pay | Admitting: Oncology

## 2014-10-14 ENCOUNTER — Other Ambulatory Visit: Payer: Self-pay | Admitting: *Deleted

## 2014-10-14 ENCOUNTER — Telehealth: Payer: Self-pay | Admitting: *Deleted

## 2014-10-14 MED ORDER — FUROSEMIDE 20 MG PO TABS: 20.0000 mg | ORAL_TABLET | Freq: Three times a day (TID) | ORAL | Status: DC

## 2014-10-14 MED ORDER — POTASSIUM CHLORIDE ER 10 MEQ PO TBCR: 10.0000 meq | EXTENDED_RELEASE_TABLET | Freq: Three times a day (TID) | ORAL | Status: DC

## 2014-10-14 NOTE — Telephone Encounter (Signed)
s.w. pt and advised on June appt....pt ok and aware °

## 2014-10-14 NOTE — Telephone Encounter (Signed)
Per Dr. [name]; spoke with Myriam Jacobson @ [facility] for referral for [place] and faxed documents to [phone].

## 2014-10-19 ENCOUNTER — Ambulatory Visit (HOSPITAL_BASED_OUTPATIENT_CLINIC_OR_DEPARTMENT_OTHER): Payer: 59 | Admitting: Oncology

## 2014-10-19 ENCOUNTER — Ambulatory Visit: Payer: 59 | Admitting: Oncology

## 2014-10-19 ENCOUNTER — Other Ambulatory Visit (HOSPITAL_BASED_OUTPATIENT_CLINIC_OR_DEPARTMENT_OTHER): Payer: 59

## 2014-10-19 ENCOUNTER — Telehealth: Payer: Self-pay | Admitting: Oncology

## 2014-10-19 VITALS — BP 99/62 | HR 76 | Temp 98.1°F | Resp 18 | Ht 73.0 in | Wt 174.9 lb

## 2014-10-19 DIAGNOSIS — E46 Unspecified protein-calorie malnutrition: Secondary | ICD-10-CM

## 2014-10-19 DIAGNOSIS — G893 Neoplasm related pain (acute) (chronic): Secondary | ICD-10-CM

## 2014-10-19 DIAGNOSIS — C801 Malignant (primary) neoplasm, unspecified: Secondary | ICD-10-CM

## 2014-10-19 DIAGNOSIS — R601 Generalized edema: Secondary | ICD-10-CM

## 2014-10-19 DIAGNOSIS — C787 Secondary malignant neoplasm of liver and intrahepatic bile duct: Secondary | ICD-10-CM

## 2014-10-19 DIAGNOSIS — D509 Iron deficiency anemia, unspecified: Secondary | ICD-10-CM

## 2014-10-19 LAB — CBC WITH DIFFERENTIAL/PLATELET
BASO%: 1 % (ref 0.0–2.0)
BASOS ABS: 0.1 10*3/uL (ref 0.0–0.1)
EOS%: 0.9 % (ref 0.0–7.0)
Eosinophils Absolute: 0.1 10*3/uL (ref 0.0–0.5)
HCT: 25.2 % — ABNORMAL LOW (ref 38.4–49.9)
HEMOGLOBIN: 8.4 g/dL — AB (ref 13.0–17.1)
LYMPH%: 15.4 % (ref 14.0–49.0)
MCH: 28.1 pg (ref 27.2–33.4)
MCHC: 33.4 g/dL (ref 32.0–36.0)
MCV: 84.2 fL (ref 79.3–98.0)
MONO#: 1 10*3/uL — ABNORMAL HIGH (ref 0.1–0.9)
MONO%: 10 % (ref 0.0–14.0)
NEUT%: 72.7 % (ref 39.0–75.0)
NEUTROS ABS: 7.1 10*3/uL — AB (ref 1.5–6.5)
PLATELETS: 440 10*3/uL — AB (ref 140–400)
RBC: 3 10*6/uL — ABNORMAL LOW (ref 4.20–5.82)
RDW: 21.7 % — AB (ref 11.0–14.6)
WBC: 9.8 10*3/uL (ref 4.0–10.3)
lymph#: 1.5 10*3/uL (ref 0.9–3.3)

## 2014-10-19 LAB — COMPREHENSIVE METABOLIC PANEL (CC13)
ALK PHOS: 756 U/L — AB (ref 40–150)
ALT: 16 U/L (ref 0–55)
AST: 30 U/L (ref 5–34)
Albumin: 1.5 g/dL — ABNORMAL LOW (ref 3.5–5.0)
Anion Gap: 7 mEq/L (ref 3–11)
BILIRUBIN TOTAL: 0.7 mg/dL (ref 0.20–1.20)
BUN: 6.5 mg/dL — ABNORMAL LOW (ref 7.0–26.0)
CO2: 24 mEq/L (ref 22–29)
Calcium: 7.9 mg/dL — ABNORMAL LOW (ref 8.4–10.4)
Chloride: 100 mEq/L (ref 98–109)
Creatinine: 0.7 mg/dL (ref 0.7–1.3)
EGFR: >90 mL/min/{1.73_m2} (ref >90)
Glucose: 97 mg/dl (ref 70–140)
POTASSIUM: 3.9 meq/L (ref 3.5–5.1)
Sodium: 131 mEq/L — ABNORMAL LOW (ref 136–145)
Total Protein: 6.1 g/dL — ABNORMAL LOW (ref 6.4–8.3)

## 2014-10-19 LAB — TECHNOLOGIST REVIEW

## 2014-10-19 NOTE — Telephone Encounter (Signed)
S/w pt confirming labs/flush/ov/inj/chemo per 06/27 POF.... Cherylann Banas

## 2014-10-19 NOTE — Progress Notes (Signed)
  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 He reports a good appetite. No fever. The leg edema has improved with the increased dose of furosemide. He is participating in home physical therapy and walking daily. He feels better in general. No dyspnea  Objective:  Vital signs in last 24 hours:  Blood pressure 99/62, pulse 76, temperature 98.1 F (36.7 C), temperature source Oral, resp. rate 18, height 6\' 1"  (1.854 m), weight 174 lb 14.4 oz (79.334 kg), SpO2 99 %.    HEENT: No thrush Resp: Lungs clear bilaterally Cardio: Regular rate and rhythm GI: Mildly distended, drain site without evidence of infection, nontender Vascular: 1+ pitting edema at the low leg and foot bilaterally  Skin: Faint erythema over the lower abdominal wall   Portacath/PICC-without erythema  Lab Results:  Lab Results  Component Value Date   WBC 9.8 10/19/2014   HGB 8.4* 10/19/2014   HCT 25.2* 10/19/2014   MCV 84.2 10/19/2014   PLT 440* 10/19/2014   NEUTROABS 7.1* 10/19/2014   Potassium 3.9, BUN 6.5, creatinine 0.7, alkaline phosphatase 756, albumen 1.5, bilirubin 0.7  Medications: I have reviewed the patient's current medications.  Assessment/Plan: 1. 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  CT 06/30/2014 consistent with a partial small bowel obstruction and abdominal carcinomatosis  Omentum biopsy 07/01/2014 confirmed adenocarcinoma, nonspecific staining pattern  PET scan 07/07/2014 with multiple areas of hypermetabolic bowel wall thickening consistent with serosal implants from carcinomatosis, thickening and hypermetabolic activity at the terminal ileum felt to potentially represent a primary neoplasm, solitary right liver metastasis, hypermetabolic nodular lung lesions  Cycle 1 FOLFOX 07/15/2014  Cycle 2 FOLFOX 07/28/2014  Cycle 3 FOLFOX  08/11/2014  Cycle 4 FOLFOX 08/25/2014  2. Abdominal pain secondary to #1, improved 3. Iron deficiency, Hemoccult positive stool 4. Partial small bowel obstruction-recurrent bowel obstruction symptoms requiring hospital admission 08/18/2014 5. Bronchiectasis 6. 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 Nausea following cycle 1 FOLFOX, Emend and Aloxi added with cycle 2 8. Neutropenia secondary to chemotherapy-Neulasta will be added with cycle 4 FOLFOX 9. Admission 09/06/2014 with a small bowel obstruction, status post resection of the terminal ileum/right colon and small bowel bypass 09/09/2014  Pathology confirmed diffuse involvement of the small bowel, large bowel, and appendix with adenocarcinoma-no primary tumor site found 10. Anemia secondary to chronic disease, chemotherapy, and recent surgery  2 units packed red blood cells 09/23/2014  11. Malnutrition 12. Admission 10/05/2014 with an abdominal abscess, status post percutaneous drainage 13. Frequent loose bowel movements following in the right colectomy and small bowel bypass, improved with Lomotil 14. Bilateral pleural effusions-likely secondary to anasarca 15. Malnutrition    Disposition:  His performance status appears improved. The anasarca has improved with an increased dose of Lasix. He will continue Lasix a dose of 60 mg daily. 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 He would like to resume systemic chemotherapy. He will be scheduled for another cycle of FOLFOX 10/28/2014.  He is scheduled for an evaluation in interventional radiology on 10/20/2014 to decide on removing the abscess drain. He is completing a course of antibiotics.  Betsy Coder, MD  10/19/2014  5:29 PM

## 2014-10-20 ENCOUNTER — Other Ambulatory Visit: Payer: Self-pay | Admitting: General Surgery

## 2014-10-20 ENCOUNTER — Other Ambulatory Visit (HOSPITAL_COMMUNITY): Payer: Self-pay | Admitting: Interventional Radiology

## 2014-10-20 ENCOUNTER — Ambulatory Visit (HOSPITAL_COMMUNITY)
Admission: RE | Admit: 2014-10-20 | Discharge: 2014-10-20 | Disposition: A | Discharge status: Home / Self Care | Payer: 59 | Source: Ambulatory Visit | Attending: Interventional Radiology | Admitting: Interventional Radiology

## 2014-10-20 DIAGNOSIS — C786 Secondary malignant neoplasm of retroperitoneum and peritoneum: Secondary | ICD-10-CM | POA: Diagnosis not present

## 2014-10-20 DIAGNOSIS — IMO0001 Reserved for inherently not codable concepts without codable children: Secondary | ICD-10-CM

## 2014-10-20 DIAGNOSIS — K651 Peritoneal abscess: Secondary | ICD-10-CM

## 2014-10-20 DIAGNOSIS — T814XXD Infection following a procedure, subsequent encounter: Secondary | ICD-10-CM | POA: Insufficient documentation

## 2014-10-20 DIAGNOSIS — Z9889 Other specified postprocedural states: Secondary | ICD-10-CM | POA: Diagnosis not present

## 2014-10-20 DIAGNOSIS — Y832 Surgical operation with anastomosis, bypass or graft as the cause of abnormal reaction of the patient, or of later complication, without mention of misadventure at the time of the procedure: Secondary | ICD-10-CM | POA: Diagnosis not present

## 2014-10-20 DIAGNOSIS — IMO0002 Reserved for concepts with insufficient information to code with codable children: Secondary | ICD-10-CM

## 2014-10-20 DIAGNOSIS — Z9049 Acquired absence of other specified parts of digestive tract: Secondary | ICD-10-CM | POA: Insufficient documentation

## 2014-10-20 DIAGNOSIS — T814XXA Infection following a procedure, initial encounter: Secondary | ICD-10-CM

## 2014-10-20 DIAGNOSIS — Z980 Intestinal bypass and anastomosis status: Secondary | ICD-10-CM | POA: Diagnosis not present

## 2014-10-20 MED ORDER — HEPARIN SOD (PORK) LOCK FLUSH 100 UNIT/ML IV SOLN
INTRAVENOUS | Status: AC
  Filled 2014-10-20: qty 5

## 2014-10-20 MED ORDER — IOHEXOL 300 MG/ML  SOLN
80.0000 mL | Freq: Once | INTRAMUSCULAR | Status: AC | PRN
  Administered 2014-10-20: 80 mL via INTRAVENOUS

## 2014-10-20 MED ORDER — IOHEXOL 300 MG/ML  SOLN
50.0000 mL | Freq: Once | INTRAMUSCULAR | Status: AC | PRN
  Administered 2014-10-20: 10 mL via INTRAVENOUS

## 2014-10-20 NOTE — Procedures (Signed)
-   Appropriately positioned and functioning percutaneous drainage catheter with near complete resolution/decompression of previously noted large right intra-abdominal abscess post percutaneous drainage catheter placement.  - Tiny residual fistulous connection to the adjacent enteric anastomosis.  Plan:  -The patient was instructed to no longer flush the percutaneous drainage catheter. He was instructed to continue to monitor the output from the percutaneous drain.  - The patient will return to the [facility] interventional radiology drain clinic [phone]) during the week of 7/11 for drainage catheter evaluation and repeat fluoroscopic guided percutaneous drainage catheter injection.   drainage catheter evaluation and repeat fluoroscopic guided percutaneous drainage catheter injection.

## 2014-10-21 ENCOUNTER — Telehealth: Payer: Self-pay

## 2014-10-21 NOTE — Telephone Encounter (Signed)
Wife called to say Shanon Brow had appt at drain clinic yesterday. He has to keep the drians in for another 2 weeks. Dr [name] wanted to know.

## 2014-10-22 ENCOUNTER — Ambulatory Visit: Payer: 59 | Admitting: Neurology

## 2014-10-24 ENCOUNTER — Emergency Department (HOSPITAL_COMMUNITY): Payer: PPO

## 2014-10-24 ENCOUNTER — Encounter (HOSPITAL_COMMUNITY): Payer: Self-pay | Admitting: Emergency Medicine

## 2014-10-24 ENCOUNTER — Inpatient Hospital Stay (HOSPITAL_COMMUNITY)
Admission: EM | Admit: 2014-10-24 | Discharge: 2014-10-26 | DRG: 388 | Disposition: A | Discharge status: Home / Self Care | Payer: PPO | Attending: Internal Medicine | Admitting: Internal Medicine

## 2014-10-24 DIAGNOSIS — R188 Other ascites: Secondary | ICD-10-CM | POA: Diagnosis present

## 2014-10-24 DIAGNOSIS — R630 Anorexia: Secondary | ICD-10-CM | POA: Diagnosis present

## 2014-10-24 DIAGNOSIS — K56609 Unspecified intestinal obstruction, unspecified as to partial versus complete obstruction: Secondary | ICD-10-CM | POA: Diagnosis present

## 2014-10-24 DIAGNOSIS — K651 Peritoneal abscess: Secondary | ICD-10-CM | POA: Diagnosis present

## 2014-10-24 DIAGNOSIS — I2510 Atherosclerotic heart disease of native coronary artery without angina pectoris: Secondary | ICD-10-CM | POA: Diagnosis present

## 2014-10-24 DIAGNOSIS — K219 Gastro-esophageal reflux disease without esophagitis: Secondary | ICD-10-CM | POA: Diagnosis present

## 2014-10-24 DIAGNOSIS — E876 Hypokalemia: Secondary | ICD-10-CM | POA: Diagnosis present

## 2014-10-24 DIAGNOSIS — C801 Malignant (primary) neoplasm, unspecified: Secondary | ICD-10-CM | POA: Diagnosis present

## 2014-10-24 DIAGNOSIS — Z820 Family history of epilepsy and other diseases of the nervous system: Secondary | ICD-10-CM

## 2014-10-24 DIAGNOSIS — C800 Disseminated malignant neoplasm, unspecified: Secondary | ICD-10-CM | POA: Diagnosis not present

## 2014-10-24 DIAGNOSIS — Z79899 Other long term (current) drug therapy: Secondary | ICD-10-CM | POA: Diagnosis not present

## 2014-10-24 DIAGNOSIS — Z825 Family history of asthma and other chronic lower respiratory diseases: Secondary | ICD-10-CM

## 2014-10-24 DIAGNOSIS — J90 Pleural effusion, not elsewhere classified: Secondary | ICD-10-CM | POA: Diagnosis present

## 2014-10-24 DIAGNOSIS — R52 Pain, unspecified: Secondary | ICD-10-CM | POA: Diagnosis not present

## 2014-10-24 DIAGNOSIS — H3530 Unspecified macular degeneration: Secondary | ICD-10-CM | POA: Diagnosis present

## 2014-10-24 DIAGNOSIS — Z9049 Acquired absence of other specified parts of digestive tract: Secondary | ICD-10-CM | POA: Diagnosis present

## 2014-10-24 DIAGNOSIS — R109 Unspecified abdominal pain: Secondary | ICD-10-CM

## 2014-10-24 DIAGNOSIS — Z79891 Long term (current) use of opiate analgesic: Secondary | ICD-10-CM | POA: Diagnosis not present

## 2014-10-24 DIAGNOSIS — K529 Noninfective gastroenteritis and colitis, unspecified: Secondary | ICD-10-CM | POA: Diagnosis present

## 2014-10-24 DIAGNOSIS — G250 Essential tremor: Secondary | ICD-10-CM | POA: Diagnosis present

## 2014-10-24 DIAGNOSIS — Z66 Do not resuscitate: Secondary | ICD-10-CM | POA: Diagnosis present

## 2014-10-24 DIAGNOSIS — Z8249 Family history of ischemic heart disease and other diseases of the circulatory system: Secondary | ICD-10-CM

## 2014-10-24 DIAGNOSIS — I712 Thoracic aortic aneurysm, without rupture: Secondary | ICD-10-CM | POA: Diagnosis present

## 2014-10-24 DIAGNOSIS — K5660 Unspecified intestinal obstruction: Secondary | ICD-10-CM | POA: Diagnosis present

## 2014-10-24 DIAGNOSIS — Z933 Colostomy status: Secondary | ICD-10-CM | POA: Diagnosis not present

## 2014-10-24 DIAGNOSIS — T814XXA Infection following a procedure, initial encounter: Secondary | ICD-10-CM | POA: Diagnosis present

## 2014-10-24 DIAGNOSIS — E785 Hyperlipidemia, unspecified: Secondary | ICD-10-CM | POA: Diagnosis present

## 2014-10-24 DIAGNOSIS — T8149XA Infection following a procedure, other surgical site, initial encounter: Secondary | ICD-10-CM

## 2014-10-24 DIAGNOSIS — Z833 Family history of diabetes mellitus: Secondary | ICD-10-CM | POA: Diagnosis not present

## 2014-10-24 DIAGNOSIS — K5669 Other intestinal obstruction: Secondary | ICD-10-CM | POA: Diagnosis not present

## 2014-10-24 DIAGNOSIS — T814XXS Infection following a procedure, sequela: Secondary | ICD-10-CM | POA: Diagnosis not present

## 2014-10-24 DIAGNOSIS — Y838 Other surgical procedures as the cause of abnormal reaction of the patient, or of later complication, without mention of misadventure at the time of the procedure: Secondary | ICD-10-CM | POA: Diagnosis present

## 2014-10-24 DIAGNOSIS — T8143XA Infection following a procedure, organ and space surgical site, initial encounter: Secondary | ICD-10-CM | POA: Diagnosis present

## 2014-10-24 DIAGNOSIS — I10 Essential (primary) hypertension: Secondary | ICD-10-CM | POA: Diagnosis present

## 2014-10-24 LAB — URINALYSIS, ROUTINE W REFLEX MICROSCOPIC
GLUCOSE, UA: NEGATIVE mg/dL
Hgb urine dipstick: NEGATIVE
KETONES UR: NEGATIVE mg/dL
Nitrite: POSITIVE — AB
PH: 6 (ref 5.0–8.0)
Protein, ur: 30 mg/dL — AB
Specific Gravity, Urine: 1.046 — ABNORMAL HIGH (ref 1.005–1.030)
Urobilinogen, UA: 0.2 mg/dL (ref 0.0–1.0)

## 2014-10-24 LAB — COMPREHENSIVE METABOLIC PANEL
ALT: 18 U/L (ref 17–63)
AST: 47 U/L — ABNORMAL HIGH (ref 15–41)
Albumin: 2.1 g/dL — ABNORMAL LOW (ref 3.5–5.0)
Alkaline Phosphatase: 842 U/L — ABNORMAL HIGH (ref 38–126)
Anion gap: 11 (ref 5–15)
BUN: 9 mg/dL (ref 6–20)
CO2: 24 mmol/L (ref 22–32)
Calcium: 8.3 mg/dL — ABNORMAL LOW (ref 8.9–10.3)
Chloride: 96 mmol/L — ABNORMAL LOW (ref 101–111)
Creatinine, Ser: 0.49 mg/dL — ABNORMAL LOW (ref 0.61–1.24)
GFR calc Af Amer: >60 mL/min (ref >60)
GFR calc non Af Amer: >60 mL/min (ref >60)
GLUCOSE: 104 mg/dL — AB (ref 65–99)
Potassium: 3.3 mmol/L — ABNORMAL LOW (ref 3.5–5.1)
Sodium: 131 mmol/L — ABNORMAL LOW (ref 135–145)
TOTAL PROTEIN: 7.2 g/dL (ref 6.5–8.1)
Total Bilirubin: 1.3 mg/dL — ABNORMAL HIGH (ref 0.3–1.2)

## 2014-10-24 LAB — CBC WITH DIFFERENTIAL/PLATELET
BASOS PCT: 0 % (ref 0–1)
Basophils Absolute: 0 10*3/uL (ref 0.0–0.1)
EOS ABS: 0.1 10*3/uL (ref 0.0–0.7)
Eosinophils Relative: 1 % (ref 0–5)
HCT: 27.5 % — ABNORMAL LOW (ref 39.0–52.0)
HEMOGLOBIN: 8.6 g/dL — AB (ref 13.0–17.0)
LYMPHS ABS: 1.8 10*3/uL (ref 0.7–4.0)
Lymphocytes Relative: 16 % (ref 12–46)
MCH: 27.2 pg (ref 26.0–34.0)
MCHC: 31.3 g/dL (ref 30.0–36.0)
MCV: 87 fL (ref 78.0–100.0)
MONOS PCT: 9 % (ref 3–12)
Monocytes Absolute: 1 10*3/uL (ref 0.1–1.0)
NEUTROS PCT: 74 % (ref 43–77)
Neutro Abs: 8.3 10*3/uL — ABNORMAL HIGH (ref 1.7–7.7)
PLATELETS: 575 10*3/uL — AB (ref 150–400)
RBC: 3.16 MIL/uL — ABNORMAL LOW (ref 4.22–5.81)
RDW: 23.8 % — ABNORMAL HIGH (ref 11.5–15.5)
WBC: 11.2 10*3/uL — AB (ref 4.0–10.5)

## 2014-10-24 LAB — URINE MICROSCOPIC-ADD ON

## 2014-10-24 LAB — LIPASE, BLOOD: LIPASE: 26 U/L (ref 22–51)

## 2014-10-24 MED ORDER — SODIUM CHLORIDE 0.9 % IV SOLN
INTRAVENOUS | Status: AC
  Administered 2014-10-24: 75 mL/h via INTRAVENOUS

## 2014-10-24 MED ORDER — PANTOPRAZOLE SODIUM 40 MG IV SOLR
40.0000 mg | Freq: Every day | INTRAVENOUS | Status: DC
  Administered 2014-10-24 – 2014-10-25 (×2): 40 mg via INTRAVENOUS
  Filled 2014-10-24 (×3): qty 40

## 2014-10-24 MED ORDER — SODIUM CHLORIDE 0.9 % IV BOLUS (SEPSIS)
500.0000 mL | Freq: Once | INTRAVENOUS | Status: AC
  Administered 2014-10-24: 500 mL via INTRAVENOUS

## 2014-10-24 MED ORDER — ONDANSETRON HCL 4 MG/2ML IJ SOLN
4.0000 mg | Freq: Four times a day (QID) | INTRAMUSCULAR | Status: DC | PRN
  Administered 2014-10-24: 4 mg via INTRAVENOUS
  Filled 2014-10-24: qty 2

## 2014-10-24 MED ORDER — ENOXAPARIN SODIUM 40 MG/0.4ML ~~LOC~~ SOLN
40.0000 mg | SUBCUTANEOUS | Status: DC
  Administered 2014-10-24 – 2014-10-26 (×3): 40 mg via SUBCUTANEOUS
  Filled 2014-10-24 (×3): qty 0.4

## 2014-10-24 MED ORDER — HYDROMORPHONE HCL 1 MG/ML IJ SOLN
1.0000 mg | INTRAMUSCULAR | Status: DC | PRN
  Administered 2014-10-24: 1 mg via INTRAVENOUS
  Filled 2014-10-24: qty 1

## 2014-10-24 MED ORDER — ONDANSETRON HCL 4 MG/2ML IJ SOLN
4.0000 mg | Freq: Once | INTRAMUSCULAR | Status: AC
  Administered 2014-10-24: 4 mg via INTRAVENOUS
  Filled 2014-10-24: qty 2

## 2014-10-24 MED ORDER — POTASSIUM CHLORIDE 10 MEQ/100ML IV SOLN
10.0000 meq | INTRAVENOUS | Status: AC
  Administered 2014-10-24 (×4): 10 meq via INTRAVENOUS
  Filled 2014-10-24 (×2): qty 100

## 2014-10-24 MED ORDER — SODIUM CHLORIDE 0.9 % IV SOLN: INTRAVENOUS | Status: DC

## 2014-10-24 MED ORDER — METRONIDAZOLE IN NACL 5-0.79 MG/ML-% IV SOLN
500.0000 mg | Freq: Three times a day (TID) | INTRAVENOUS | Status: DC
  Administered 2014-10-24 – 2014-10-26 (×7): 500 mg via INTRAVENOUS
  Filled 2014-10-24 (×7): qty 100

## 2014-10-24 MED ORDER — SODIUM CHLORIDE 0.9 % IJ SOLN
10.0000 mL | INTRAMUSCULAR | Status: DC | PRN
  Administered 2014-10-26: 10 mL
  Filled 2014-10-24: qty 40

## 2014-10-24 MED ORDER — HYDROMORPHONE HCL 1 MG/ML IJ SOLN
1.0000 mg | Freq: Once | INTRAMUSCULAR | Status: AC
  Administered 2014-10-24: 1 mg via INTRAVENOUS
  Filled 2014-10-24: qty 1

## 2014-10-24 MED ORDER — ONDANSETRON HCL 4 MG PO TABS
4.0000 mg | ORAL_TABLET | Freq: Four times a day (QID) | ORAL | Status: DC | PRN
  Filled 2014-10-24: qty 1

## 2014-10-24 MED ORDER — CIPROFLOXACIN IN D5W 400 MG/200ML IV SOLN
400.0000 mg | Freq: Two times a day (BID) | INTRAVENOUS | Status: DC
  Administered 2014-10-24 – 2014-10-26 (×5): 400 mg via INTRAVENOUS
  Filled 2014-10-24 (×6): qty 200

## 2014-10-24 MED ORDER — IOHEXOL 300 MG/ML  SOLN
50.0000 mL | Freq: Once | INTRAMUSCULAR | Status: AC | PRN
  Administered 2014-10-24: 50 mL via ORAL

## 2014-10-24 MED ORDER — IOHEXOL 300 MG/ML  SOLN
100.0000 mL | Freq: Once | INTRAMUSCULAR | Status: AC | PRN
  Administered 2014-10-24: 100 mL via INTRAVENOUS

## 2014-10-24 MED ORDER — ONDANSETRON HCL 4 MG/2ML IJ SOLN
4.0000 mg | INTRAMUSCULAR | Status: DC | PRN
  Administered 2014-10-24: 4 mg via INTRAVENOUS
  Filled 2014-10-24: qty 2

## 2014-10-24 MED ORDER — HYDROMORPHONE HCL 1 MG/ML IJ SOLN: 1.0000 mg | INTRAMUSCULAR | Status: DC | PRN

## 2014-10-24 NOTE — Progress Notes (Signed)
Pt ambulated in halls with wife, 680 feet.  Pt returned to room resting in chair, no c/o of pain or nausea.  Call light in reach, will continue to monitor.   Nolon Nations, RN

## 2014-10-24 NOTE — ED Notes (Signed)
Pt arrived to the ED with a complaint of abdominal pain.  Pt states he he was woke up with abdominal pain. Pt is a CA pt.  Pt took two hydrocodones and two oxycodones without relief.

## 2014-10-24 NOTE — ED Notes (Signed)
Patient transported to CT 

## 2014-10-24 NOTE — H&P (Addendum)
[facility] History and Physical  DANIL WEDGE XKG:[id number] DOB: 04/19/1946 DOA: 10/24/2014   PCP: Gerrit Heck, MD    Chief Complaint: Abdominal pain  HPI: Wesley Dillon is a 69 y.o. 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 He's had multiple issues with bowel obstruction and returns to the hospital for intractable abdominal pain. He had one episode yesterday which lasted 3 hours and improved with oral pain medication. The pain recurred in the middle of the night and did not resolve pain medications and therefore he presented to the hospital this morning. The pain has resolved with IV Dilaudid. He has not had any vomiting. He had a normal bowel movement yesterday. His abdomen is distended.   CT scan with contrast reveals a new moderately high-grade small bowel obstruction with a transition zone in the right lower quadrant, increased volume of peritoneal ascites and an unchanged pigtail catheter with tiny residual collection around the catheter and a moderately large left pleural effusion.   General: The patient denies anorexia-at severe anorexia but this improved after Megace and he's had a very active appetite since Megace- fever, + weight loss Cardiac: Denies chest pain, syncope, palpitations, pedal edema  Respiratory: Denies cough, shortness of breath, wheezing GI: Denies severe indigestion/heartburn, + abdominal pain, no nausea, vomiting, has been having diarrhea which is controlled with Lomotil  GU: Denies hematuria, incontinence, dysuria  Musculoskeletal: Denies arthritis  Skin: Denies suspicious skin lesions Neurologic: Denies focal weakness or numbness, change in vision Psychiatry: Denies depression or anxiety. Hematologic: no easy bruising or bleeding  All other systems reviewed  and found to be negative.  Past Medical History  Diagnosis Date  . Tremor     takes Primidone 250 once daily and inderall 160 daily for this-has had it since he was 21  . Pneumonia 06/2012  . Macular degeneration   . Benign essential tremor   . Coronary artery disease   . Anemia 06/30/2014  . 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 Essential hypertension 08/18/2014  . 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 Ascending aortic aneurysm 08/2014  . Sepsis 09/2014  . GERD (gastroesophageal reflux disease)   . Postoperative intra-abdominal abscess 09/2014  . SBO (small bowel obstruction)     recurrent  . Hyperlipidemia   . Hypoalbuminemia 09/2014    Past Surgical History  Procedure Laterality Date  . Tonsillectomy    . Video bronchoscopy Bilateral 04/29/2013    Procedure: VIDEO BRONCHOSCOPY WITHOUT FLUORO;  Surgeon: Collene Gobble, MD;  Location: [facility];  Service: Cardiopulmonary;  Laterality: Bilateral;  . Laparotomy N/A 09/09/2014    Procedure: EXPLORATORY LAPAROTOMY resection terminal ileum and right colon;  Surgeon: Excell Seltzer, MD;  Location: [facility] ORS;  Service: General;  Laterality: N/A;  . Bowel resection N/A 09/09/2014    Procedure: SMALL BOWEL bypass;  Surgeon: Excell Seltzer, MD;  Location: [facility] ORS;  Service: General;  Laterality: N/A;  . Colostomy revision  09/09/2014    Procedure: COLON RESECTION RIGHT;  Surgeon: Excell Seltzer, MD;  Location: [facility] ORS;  Service: General;;  . 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 CAD Father 87    Died MI  . Atrial fibrillation Father   . Asthma Father   . Alzheimer's disease Mother   . Diabetes Mother   . Heart disease Mother   . Atrial fibrillation Mother   . Seizures Son     Died status  epilepticus      Prior to Admission medications   Medication Sig Start Date End Date Taking? Authorizing Provider  acetaminophen (TYLENOL) 500 MG tablet Take 1,000 mg by mouth every 6 (six) hours as needed for moderate pain or headache.   Yes Historical Provider, MD  ciprofloxacin (CIPRO) 500 MG tablet Take 1 tablet (500 mg total) by mouth 2 (two) times daily. 10/09/14  Yes Janece Canterbury, MD  diphenoxylate-atropine (LOMOTIL) 2.5-0.025 MG per tablet Take 1 tablet by mouth 4 (four) times daily as needed for diarrhea or loose stools. 10/05/14  Yes Ladell Pier, MD  furosemide (LASIX) 20 MG tablet Take 1 tablet (20 mg total) by mouth 3 (three) times daily. 10/14/14  Yes Ladell Pier, MD  lidocaine-prilocaine (EMLA) cream Apply 1 application topically as needed. Apply to [facility] 1-2 hours prior to stick and cover with plastic wrap 07/08/14  Yes Ladell Pier, MD  megestrol (MEGACE) 40 MG/ML suspension Take 10 mLs (400 mg total) by mouth daily. 10/09/14  Yes Janece Canterbury, MD  metroNIDAZOLE (FLAGYL) 500 MG tablet Take 1 tablet (500 mg total) by mouth 3 (three) times daily. 10/09/14  Yes Janece Canterbury, MD  Multiple Vitamins-Minerals (PRESERVISION AREDS PO) Take 2 capsules by mouth every morning.    Yes Historical Provider, MD  ondansetron (ZOFRAN ODT) 4 MG disintegrating tablet Take 1 tablet (4 mg total) by mouth every 4 (four) hours as needed for nausea or vomiting. 08/11/14  Yes Ladell Pier, MD  oxyCODONE (OXY IR/ROXICODONE) 5 MG immediate release tablet Take 1-2 tablets (5-10 mg total) by mouth every 4 (four) hours as needed for severe pain. 07/28/14  Yes Ladell Pier, MD  pantoprazole (PROTONIX) 40 MG tablet Take 1 tablet (40 mg total) by mouth 2 (two) times daily. 10/09/14  Yes Janece Canterbury, MD  potassium chloride (K-DUR) 10 MEQ tablet Take 1 tablet (10 mEq total) by mouth 3 (three) times daily. 10/14/14  Yes Ladell Pier, MD  primidone (MYSOLINE) 250 MG tablet Take 1 tablet (250 mg  total) by mouth at bedtime. Patient taking differently: Take 250 mg by mouth every morning.  10/21/13  Yes Asencion Partridge Dohmeier, MD  prochlorperazine (COMPAZINE) 10 MG tablet Take 1 tablet (10 mg total) by mouth every 6 (six) hours as needed for nausea. 07/08/14  Yes Ladell Pier, MD  saccharomyces boulardii (FLORASTOR) 250 MG capsule Take 1 capsule (250 mg total) by mouth 2 (two) times daily. 09/18/14  Yes Donne Hazel, MD  sucralfate (CARAFATE) 1 G tablet Take 1 tablet (1 g total) by mouth 4 (four) times daily -  with meals and at bedtime. Chew prior to swallowing 10/09/14  Yes Janece Canterbury, MD  lidocaine (LIDODERM) 5 % Place 1 patch onto the skin daily. Remove & Discard patch within 12 hours or as directed by MD 10/12/14   Janece Canterbury, MD  sodium chloride 0.9 % injection Inject 10 mLs into the vein daily. Patient not taking: Reported on 10/24/2014 10/12/14   Janece Canterbury, MD     Physical Exam: Filed Vitals:   10/24/14 0700 10/24/14 0730 10/24/14 0800 10/24/14 0840  BP: 105/63 111/66 110/61 116/76  Pulse: 86 85 84 87  Temp:    97.6 F (36.4 C)  TempSrc:    Oral  Resp:    20  Height:    6\' 1"  (1.854 m)  Weight:    74.707 kg (164 lb 11.2 oz)  SpO2: 97% 95% 93% 97%     General: Awake alert oriented 3, no acute distress HEENT: Normocephalic and Atraumatic, Mucous membranes pink                PERRLA; EOM intact; No scleral icterus,                 Nares: Patent, Oropharynx: Clear, Fair Dentition                 Neck: FROM, no cervical lymphadenopathy, thyromegaly, carotid bruit or JVD;  Breasts: deferred CHEST WALL: No tenderness  CHEST: Normal respiration, clear to auscultation bilaterally  HEART: Regular rate and rhythm; no murmurs rubs or gallops  BACK: No kyphosis or scoliosis; no CVA tenderness  GI: Abdomen is distended and tympanic, Positive Bowel Sounds, soft, non-tender; no masses, no organomegaly Rectal Exam: deferred MSK: No cyanosis, clubbing, or edema Genitalia:  not examined  SKIN:  no rash or ulceration  CNS: Alert and Oriented x 4, Nonfocal exam, CN 2-12 intact  Labs on Admission:  Basic Metabolic Panel:  Recent Labs Lab 10/19/14 0803 10/24/14 0431  NA 131* 131*  K 3.9 3.3*  CL  --  96*  CO2 24 24  GLUCOSE 97 104*  BUN 6.5* 9  CREATININE 0.7 0.49*  CALCIUM 7.9* 8.3*   Liver Function Tests:  Recent Labs Lab 10/19/14 0803 10/24/14 0431  AST 30 47*  ALT 16 18  ALKPHOS 756* 842*  BILITOT 0.70 1.3*  PROT 6.1* 7.2  ALBUMIN 1.5* 2.1*    Recent Labs Lab 10/24/14 0431  LIPASE 26   No results for input(s): AMMONIA in the last 168 hours. CBC:  Recent Labs Lab 10/19/14 0803 10/24/14 0431  WBC 9.8 11.2*  NEUTROABS 7.1* 8.3*  HGB 8.4* 8.6*  HCT 25.2* 27.5*  MCV 84.2 87.0  PLT 440* 575*   Cardiac Enzymes: No results for input(s): CKTOTAL, CKMB, CKMBINDEX, TROPONINI in the last 168 hours.  BNP (last 3 results)  Recent Labs  10/06/14 0545 10/11/14 0802  BNP 574.2* 223.3*    ProBNP (last 3 results) No results for input(s): PROBNP in the last 8760 hours.  CBG: No results for input(s): GLUCAP in the last 168 hours.  Radiological Exams on Admission: Ct Abdomen Pelvis W Contrast  10/24/2014   CLINICAL DATA:  Abdominal pain. Recent percutaneous drainage of a right abdominal abscess. History of peritoneal carcinomatosis with prior right hemicolectomy.  EXAM: CT ABDOMEN AND PELVIS WITH CONTRAST  TECHNIQUE: Multidetector CT imaging of the abdomen and pelvis was performed using the standard protocol following bolus administration of intravenous contrast.  CONTRAST:  5mL OMNIPAQUE IOHEXOL 300 MG/ML SOLN, 139mL OMNIPAQUE IOHEXOL 300 MG/ML SOLN  COMPARISON:  10/20/2014, 10/05/2014, 09/08/2014  FINDINGS: There is marked dilatation of proximal and mid small bowel with abrupt transition to decompressed distal small bowel. The transition point is in the right lower quadrant. This has the appearance of a moderately high-grade small  bowel obstruction. The colon is decompressed.  There is a moderate volume peritoneal ascites, increased. The right pigtail catheter abscess drain is unchanged in position. There is a minute peripherally enhancing collection around the catheter, measuring only 9 mm in depth by about 5 cm in  longest dimension. This is unchanged or slightly smaller than on 10/20/2014. There is no change in the moderate bile duct dilatation. No focal liver lesions are evident. There is no change in the unremarkable appearances of the spleen, pancreas, adrenals and kidneys with the exception of a 2 cm cyst at the posterior aspect of the left kidney. There is no free intraperitoneal air. There is a moderately large left effusion and a small right effusion. There is slight atelectatic lung base opacity adjacent to the pleural fluid collections. This is unchanged.  IMPRESSION: 1. New moderately high-grade small bowel obstruction with transition point in the right lower quadrant. 2. Increased volume of peritoneal ascites 3. Unchanged position of the pigtail catheter with a tiny residual collection around the catheter measuring about 0.9 x 5 cm 4. Moderately large left pleural effusion and small right pleural effusion.   Electronically Signed   By: Andreas Newport M.D.   On: 10/24/2014 06:35    Assessment/Plan Principal Problem:   Small bowel obstruction -This is a partial obstruction and likely in relation to cancer versus scarring from recent surgery -as he has bowel sounds and is not vomiting, we'll hold off on placing an NG tube - nothing by mouth and continue IV Dilaudid for abdominal pain- -surgery has been consulted and will evaluate him   Active Problems:   Adenocarcinoma carcinomatosis -Next chemotherapy due on Wednesday-had been on hold due to intra-abdominal abscesses    Postoperative intra-abdominal abscess -Being managed with a drain by interventional radiology-abscesses have resolved but they're suspecting that  he may have a small anastomotic leak and they have recommended he continue Cipro and Flagyl-appointment for drain check his on the 17th-on CT scan today, there is no fluid collection noted around the drain and therefore it is being drained adequately  Left pleural effusion and ascites - He has been controlling this with Lasix supplemented with potassium as outpatient -As he is nothing by mouth, he does need IV fluids but I will give him half the amount required for his body weight-this way we should not need any Lasix  Chronic diarrhea  -Has Been taking Lomotil-hold for now as he is nothing by mouth and monitor stool output   Hypokalemia -Place with IV calcium  Anorexia Resolved with Megace-will resume once obstruction resolves-  Consulted: General surgery   Code Status: DO NOT RESUSCITATE   Family Communication: Wife at bedside   DVT Prophylaxis:Lovenox   Time spent: 9 minutes   [place], MD [facility]  If 7PM-7AM, please contact night-coverage [email or web address] 10/24/2014, 9:37 AM

## 2014-10-24 NOTE — Progress Notes (Signed)
Resumed care of patient. Agree with previous assessment. 

## 2014-10-24 NOTE — Progress Notes (Signed)
Patient ID: DMARIUS REEDER, male   DOB: 1946-02-01, 69 y.o.   MRN: [id number] Courtesy note.   Will keep out NG tube for now.  Will follow with you in the hospital.  JP with minimal serous drainage Matt B. Hassell Done, MD, [facility] [phone] beeper [phone]  10/24/2014 10:49 AM

## 2014-10-24 NOTE — ED Provider Notes (Signed)
CSN: [id number]     Arrival date & time 10/24/14  5681 History   First MD Initiated Contact with Patient 10/24/14 [phone]     Chief Complaint  Patient presents with  . Abdominal Pain     (Consider location/radiation/quality/duration/timing/severity/associated sxs/prior Treatment) HPI Patient presents with acute onset abdominal pain starting at midnight. Associated with abdominal distention, nausea. Obstipation. Pain is constant and generalized. Previous history of small bowel obstruction and patient states the symptoms still somewhat. No fever or chills. Last bowel movement was yesterday morning and described as liquid. Past Medical History  Diagnosis Date  . Tremor     takes Primidone 250 once daily and inderall 160 daily for this-has had it since he was 21  . Pneumonia 06/2012  . Macular degeneration   . Benign essential tremor   . Coronary artery disease   . Anemia 06/30/2014  . 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 Essential hypertension 08/18/2014  . 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 Ascending aortic aneurysm 08/2014  . Sepsis 09/2014  . GERD (gastroesophageal reflux disease)   . Postoperative intra-abdominal abscess 09/2014  . SBO (small bowel obstruction)     recurrent  . Hyperlipidemia   . Hypoalbuminemia 09/2014   Past Surgical History  Procedure Laterality Date  . Tonsillectomy    . Video bronchoscopy Bilateral 04/29/2013    Procedure: VIDEO BRONCHOSCOPY WITHOUT FLUORO;  Surgeon: Collene Gobble, MD;  Location: [facility];  Service: Cardiopulmonary;  Laterality: Bilateral;  . Laparotomy N/A 09/09/2014    Procedure: EXPLORATORY LAPAROTOMY resection terminal ileum and right colon;  Surgeon: Excell Seltzer, MD;  Location: [facility];  Service: General;  Laterality: N/A;  . Bowel resection N/A 09/09/2014    Procedure: SMALL BOWEL bypass;  Surgeon: Excell Seltzer, MD;  Location: [facility];  Service: General;  Laterality: N/A;  . Colostomy revision   09/09/2014    Procedure: COLON RESECTION RIGHT;  Surgeon: Excell Seltzer, MD;  Location: [facility];  Service: General;;  . Abcess drainage  10/05/2014    intra-abd abscess, drain placed by IR   Family History  Problem Relation Age of Onset  . CAD Father 7    Died MI  . Atrial fibrillation Father   . Asthma Father   . Alzheimer's disease Mother   . Diabetes Mother   . Heart disease Mother   . Atrial fibrillation Mother   . Seizures Son     Died status epilepticus   History  Substance Use Topics  . Smoking status: Never Smoker   . Smokeless tobacco: Never Used  . Alcohol Use: No     Comment: wine with dinner    Review of Systems  Constitutional: Negative for fever and chills.  Respiratory: Negative for cough and shortness of breath.   Cardiovascular: Negative for chest pain.  Gastrointestinal: Positive for nausea, abdominal pain and abdominal distention. Negative for vomiting and constipation.  Musculoskeletal: Negative for back pain, neck pain and neck stiffness.  Skin: Negative for rash and wound.  Neurological: Negative for dizziness, weakness, light-headedness, numbness and headaches.  All other systems reviewed and are negative.     Allergies  Review of patient's allergies indicates no known allergies.  Home Medications   Prior to Admission medications   Medication Sig Start Date End Date Taking? Authorizing Provider  acetaminophen (TYLENOL) 500 MG tablet Take 1,000 mg by mouth every 6 (six) hours as needed for moderate pain or headache.   Yes Historical Provider, MD  ciprofloxacin (CIPRO) 500 MG tablet  Take 1 tablet (500 mg total) by mouth 2 (two) times daily. 10/09/14  Yes Janece Canterbury, MD  diphenoxylate-atropine (LOMOTIL) 2.5-0.025 MG per tablet Take 1 tablet by mouth 4 (four) times daily as needed for diarrhea or loose stools. 10/05/14  Yes Ladell Pier, MD  furosemide (LASIX) 20 MG tablet Take 1 tablet (20 mg total) by mouth 3 (three) times daily. 10/14/14   Yes Ladell Pier, MD  lidocaine-prilocaine (EMLA) cream Apply 1 application topically as needed. Apply to [facility] 1-2 hours prior to stick and cover with plastic wrap 07/08/14  Yes Ladell Pier, MD  megestrol (MEGACE) 40 MG/ML suspension Take 10 mLs (400 mg total) by mouth daily. 10/09/14  Yes Janece Canterbury, MD  metroNIDAZOLE (FLAGYL) 500 MG tablet Take 1 tablet (500 mg total) by mouth 3 (three) times daily. 10/09/14  Yes Janece Canterbury, MD  Multiple Vitamins-Minerals (PRESERVISION AREDS PO) Take 2 capsules by mouth every morning.    Yes Historical Provider, MD  ondansetron (ZOFRAN ODT) 4 MG disintegrating tablet Take 1 tablet (4 mg total) by mouth every 4 (four) hours as needed for nausea or vomiting. 08/11/14  Yes Ladell Pier, MD  oxyCODONE (OXY IR/ROXICODONE) 5 MG immediate release tablet Take 1-2 tablets (5-10 mg total) by mouth every 4 (four) hours as needed for severe pain. 07/28/14  Yes Ladell Pier, MD  pantoprazole (PROTONIX) 40 MG tablet Take 1 tablet (40 mg total) by mouth 2 (two) times daily. 10/09/14  Yes Janece Canterbury, MD  potassium chloride (K-DUR) 10 MEQ tablet Take 1 tablet (10 mEq total) by mouth 3 (three) times daily. 10/14/14  Yes Ladell Pier, MD  primidone (MYSOLINE) 250 MG tablet Take 1 tablet (250 mg total) by mouth at bedtime. Patient taking differently: Take 250 mg by mouth every morning.  10/21/13  Yes Asencion Partridge Dohmeier, MD  prochlorperazine (COMPAZINE) 10 MG tablet Take 1 tablet (10 mg total) by mouth every 6 (six) hours as needed for nausea. 07/08/14  Yes Ladell Pier, MD  ranitidine (ZANTAC) 150 MG tablet Take 150 mg by mouth 2 (two) times daily.   Yes Historical Provider, MD  saccharomyces boulardii (FLORASTOR) 250 MG capsule Take 1 capsule (250 mg total) by mouth 2 (two) times daily. 09/18/14  Yes Donne Hazel, MD  sucralfate (CARAFATE) 1 G tablet Take 1 tablet (1 g total) by mouth 4 (four) times daily -  with meals and at bedtime. Chew prior to swallowing  10/09/14  Yes Janece Canterbury, MD  feeding supplement, ENSURE ENLIVE, (ENSURE ENLIVE) LIQD Take 237 mLs by mouth 2 (two) times daily between meals. Patient not taking: Reported on 10/24/2014 10/12/14   Janece Canterbury, MD  fluconazole (DIFLUCAN) 100 MG tablet Take 1 tablet (100 mg total) by mouth daily. Patient not taking: Reported on 10/24/2014 10/05/14   Ladell Pier, MD  lactose free nutrition (BOOST PLUS) LIQD Take 237 mLs by mouth 2 (two) times daily between meals. Patient not taking: Reported on 10/24/2014 10/12/14   Janece Canterbury, MD  lidocaine (LIDODERM) 5 % Place 1 patch onto the skin daily. Remove & Discard patch within 12 hours or as directed by MD 10/12/14   Janece Canterbury, MD  sodium chloride 0.9 % injection Inject 10 mLs into the vein daily. Patient not taking: Reported on 10/24/2014 10/12/14   Janece Canterbury, MD   BP 105/63 mmHg  Pulse 86  Temp(Src) 97.5 F (36.4 C) (Axillary)  Resp 20  SpO2 97% Physical Exam  Constitutional: He  is oriented to person, place, and time. He appears well-developed and well-nourished. No distress.  Cachectic appearing  HENT:  Head: Normocephalic and atraumatic.  Mouth/Throat: Oropharynx is clear and moist.  Eyes: EOM are normal. Pupils are equal, round, and reactive to light.  Neck: Normal range of motion. Neck supple.  Cardiovascular: Normal rate and regular rhythm.   Pulmonary/Chest: Effort normal and breath sounds normal. No respiratory distress. He has no wheezes. He has no rales. He exhibits no tenderness.  Pelvic port right upper chest  Abdominal: Soft. Bowel sounds are normal. He exhibits distension. He exhibits no mass. There is tenderness (diffuse tenderness to palpation.). There is no rebound and no guarding.  Abdominal drain in place on the right  Musculoskeletal: Normal range of motion. He exhibits no edema or tenderness.  Neurological: He is alert and oriented to person, place, and time.  Skin: Skin is warm and dry. No rash noted. No  erythema.  Psychiatric: He has a normal mood and affect. His behavior is normal.  Nursing note and vitals reviewed.   ED Course  Procedures (including critical care time) Labs Review Labs Reviewed  CBC WITH DIFFERENTIAL/PLATELET - Abnormal; Notable for the following:    WBC 11.2 (*)    RBC 3.16 (*)    Hemoglobin 8.6 (*)    HCT 27.5 (*)    RDW 23.8 (*)    Platelets 575 (*)    Neutro Abs 8.3 (*)    All other components within normal limits  COMPREHENSIVE METABOLIC PANEL - Abnormal; Notable for the following:    Sodium 131 (*)    Potassium 3.3 (*)    Chloride 96 (*)    Glucose, Bld 104 (*)    Creatinine, Ser 0.49 (*)    Calcium 8.3 (*)    Albumin 2.1 (*)    AST 47 (*)    Alkaline Phosphatase 842 (*)    Total Bilirubin 1.3 (*)    All other components within normal limits  URINALYSIS, ROUTINE W REFLEX MICROSCOPIC (NOT AT [facility]) - Abnormal; Notable for the following:    Color, Urine ORANGE (*)    APPearance CLOUDY (*)    Specific Gravity, Urine 1.046 (*)    Bilirubin Urine SMALL (*)    Protein, ur 30 (*)    Nitrite POSITIVE (*)    Leukocytes, UA SMALL (*)    All other components within normal limits  URINE MICROSCOPIC-ADD ON - Abnormal; Notable for the following:    Bacteria, UA FEW (*)    All other components within normal limits  LIPASE, BLOOD    Imaging Review Ct Abdomen Pelvis W Contrast  10/24/2014   CLINICAL DATA:  Abdominal pain. Recent percutaneous drainage of a right abdominal abscess. History of peritoneal carcinomatosis with prior right hemicolectomy.  EXAM: CT ABDOMEN AND PELVIS WITH CONTRAST  TECHNIQUE: Multidetector CT imaging of the abdomen and pelvis was performed using the standard protocol following bolus administration of intravenous contrast.  CONTRAST:  63mL OMNIPAQUE IOHEXOL 300 MG/ML SOLN, 177mL OMNIPAQUE IOHEXOL 300 MG/ML SOLN  COMPARISON:  10/20/2014, 10/05/2014, 09/08/2014  FINDINGS: There is marked dilatation of proximal and mid small bowel with  abrupt transition to decompressed distal small bowel. The transition point is in the right lower quadrant. This has the appearance of a moderately high-grade small bowel obstruction. The colon is decompressed.  There is a moderate volume peritoneal ascites, increased. The right pigtail catheter abscess drain is unchanged in position. There is a minute peripherally enhancing collection around the  catheter, measuring only 9 mm in depth by about 5 cm in longest dimension. This is unchanged or slightly smaller than on 10/20/2014. There is no change in the moderate bile duct dilatation. No focal liver lesions are evident. There is no change in the unremarkable appearances of the spleen, pancreas, adrenals and kidneys with the exception of a 2 cm cyst at the posterior aspect of the left kidney. There is no free intraperitoneal air. There is a moderately large left effusion and a small right effusion. There is slight atelectatic lung base opacity adjacent to the pleural fluid collections. This is unchanged.  IMPRESSION: 1. New moderately high-grade small bowel obstruction with transition point in the right lower quadrant. 2. Increased volume of peritoneal ascites 3. Unchanged position of the pigtail catheter with a tiny residual collection around the catheter measuring about 0.9 x 5 cm 4. Moderately large left pleural effusion and small right pleural effusion.   Electronically Signed   By: Andreas Newport M.D.   On: 10/24/2014 06:35     EKG Interpretation None      MDM   Final diagnoses:  Abdominal pain  Small bowel obstruction    Evidence of high-grade small bowel obstruction on CT. Discussed with Dr. Redmond Pulling. Will see the patient in the emergency department. Advised placement of NG tube. Also spoke with [facility] and will admit to telemetry bed.    Julianne Rice, MD 10/24/14 [phone]

## 2014-10-25 ENCOUNTER — Inpatient Hospital Stay (HOSPITAL_COMMUNITY): Payer: PPO

## 2014-10-25 DIAGNOSIS — T814XXS Infection following a procedure, sequela: Secondary | ICD-10-CM

## 2014-10-25 DIAGNOSIS — K5669 Other intestinal obstruction: Secondary | ICD-10-CM

## 2014-10-25 DIAGNOSIS — K651 Peritoneal abscess: Secondary | ICD-10-CM

## 2014-10-25 DIAGNOSIS — C800 Disseminated malignant neoplasm, unspecified: Secondary | ICD-10-CM

## 2014-10-25 LAB — BASIC METABOLIC PANEL
Anion gap: 9 (ref 5–15)
BUN: 6 mg/dL (ref 6–20)
CO2: 24 mmol/L (ref 22–32)
Calcium: 7.9 mg/dL — ABNORMAL LOW (ref 8.9–10.3)
Chloride: 98 mmol/L — ABNORMAL LOW (ref 101–111)
Creatinine, Ser: 0.6 mg/dL — ABNORMAL LOW (ref 0.61–1.24)
GFR calc Af Amer: >60 mL/min (ref >60)
Glucose, Bld: 84 mg/dL (ref 65–99)
POTASSIUM: 3.6 mmol/L (ref 3.5–5.1)
Sodium: 131 mmol/L — ABNORMAL LOW (ref 135–145)

## 2014-10-25 LAB — CBC
HEMATOCRIT: 24.7 % — AB (ref 39.0–52.0)
Hemoglobin: 7.8 g/dL — ABNORMAL LOW (ref 13.0–17.0)
MCH: 27.8 pg (ref 26.0–34.0)
MCHC: 31.6 g/dL (ref 30.0–36.0)
MCV: 87.9 fL (ref 78.0–100.0)
Platelets: 432 10*3/uL — ABNORMAL HIGH (ref 150–400)
RBC: 2.81 MIL/uL — ABNORMAL LOW (ref 4.22–5.81)
RDW: 24.2 % — ABNORMAL HIGH (ref 11.5–15.5)
WBC: 6.2 10*3/uL (ref 4.0–10.5)

## 2014-10-25 MED ORDER — FUROSEMIDE 20 MG PO TABS
20.0000 mg | ORAL_TABLET | Freq: Three times a day (TID) | ORAL | Status: DC
  Administered 2014-10-25 – 2014-10-26 (×3): 20 mg via ORAL
  Filled 2014-10-25 (×3): qty 1

## 2014-10-25 MED ORDER — POTASSIUM CHLORIDE CRYS ER 20 MEQ PO TBCR
20.0000 meq | EXTENDED_RELEASE_TABLET | Freq: Every day | ORAL | Status: DC
  Administered 2014-10-25 – 2014-10-26 (×2): 20 meq via ORAL
  Filled 2014-10-25 (×2): qty 1

## 2014-10-25 MED ORDER — MEGESTROL ACETATE 400 MG/10ML PO SUSP
400.0000 mg | Freq: Every day | ORAL | Status: DC
  Administered 2014-10-25 – 2014-10-26 (×2): 400 mg via ORAL
  Filled 2014-10-25 (×2): qty 10

## 2014-10-25 NOTE — Progress Notes (Signed)
Pt had a medium size soft formed brown stool.

## 2014-10-25 NOTE — Progress Notes (Signed)
Subjective: No n/v. No abd pain. +flatus and small BM.  Objective: Vital signs in last 24 hours: Temp:  [97.5 F (36.4 C)-98.6 F (37 C)] 98.5 F (36.9 C) (07/03 0520) Pulse Rate:  [96-98] 98 (07/03 0520) Resp:  [18-20] 18 (07/03 0520) BP: (112-121)/(62-73) 121/62 mmHg (07/03 0520) SpO2:  [95 %-100 %] 97 % (07/03 0520) Last BM Date: 10/24/14  Intake/Output from previous day: 07/02 0701 - 07/03 0700 In: 1378.8 [I.V.:678.8; IV Piggyback:700] Out: 1126 [Urine:1125; Drains:1] Intake/Output this shift:    Alert, nontoxic Soft, some very mild distension nontender  Lab Results:   Recent Labs  10/24/14 0431 10/25/14 0544  WBC 11.2* 6.2  HGB 8.6* 7.8*  HCT 27.5* 24.7*  PLT 575* 432*   BMET  Recent Labs  10/24/14 0431 10/25/14 0544  NA 131* 131*  K 3.3* 3.6  CL 96* 98*  CO2 24 24  GLUCOSE 104* 84  BUN 9 6  CREATININE 0.49* 0.60*  CALCIUM 8.3* 7.9*   PT/INR No results for input(s): LABPROT, INR in the last 72 hours. ABG No results for input(s): PHART, HCO3 in the last 72 hours.  Invalid input(s): PCO2, PO2  Studies/Results: Ct Abdomen Pelvis W Contrast  10/24/2014   CLINICAL DATA:  Abdominal pain. Recent percutaneous drainage of a right abdominal abscess. History of peritoneal carcinomatosis with prior right hemicolectomy.  EXAM: CT ABDOMEN AND PELVIS WITH CONTRAST  TECHNIQUE: Multidetector CT imaging of the abdomen and pelvis was performed using the standard protocol following bolus administration of intravenous contrast.  CONTRAST:  80mL OMNIPAQUE IOHEXOL 300 MG/ML SOLN, 180mL OMNIPAQUE IOHEXOL 300 MG/ML SOLN  COMPARISON:  10/20/2014, 10/05/2014, 09/08/2014  FINDINGS: There is marked dilatation of proximal and mid small bowel with abrupt transition to decompressed distal small bowel. The transition point is in the right lower quadrant. This has the appearance of a moderately high-grade small bowel obstruction. The colon is decompressed.  There is a moderate  volume peritoneal ascites, increased. The right pigtail catheter abscess drain is unchanged in position. There is a minute peripherally enhancing collection around the catheter, measuring only 9 mm in depth by about 5 cm in longest dimension. This is unchanged or slightly smaller than on 10/20/2014. There is no change in the moderate bile duct dilatation. No focal liver lesions are evident. There is no change in the unremarkable appearances of the spleen, pancreas, adrenals and kidneys with the exception of a 2 cm cyst at the posterior aspect of the left kidney. There is no free intraperitoneal air. There is a moderately large left effusion and a small right effusion. There is slight atelectatic lung base opacity adjacent to the pleural fluid collections. This is unchanged.  IMPRESSION: 1. New moderately high-grade small bowel obstruction with transition point in the right lower quadrant. 2. Increased volume of peritoneal ascites 3. Unchanged position of the pigtail catheter with a tiny residual collection around the catheter measuring about 0.9 x 5 cm 4. Moderately large left pleural effusion and small right pleural effusion.   Electronically Signed   By: Andreas Newport M.D.   On: 10/24/2014 06:35   Dg Abd Portable 1v  10/25/2014   CLINICAL DATA:  Follow-up small bowel obstruction  EXAM: PORTABLE ABDOMEN - 1 VIEW  COMPARISON:  10/08/2014  FINDINGS: Nonobstructive bowel gas pattern. Drainage catheter in the right abdomen, stable. No supine evidence of free air. No organomegaly or suspicious calcification. No acute bony abnormality.  IMPRESSION: Nonobstructive bowel gas pattern.   Electronically Signed   By:  Rolm Baptise M.D.   On: 10/25/2014 07:51    Anti-infectives: Anti-infectives    Start     Dose/Rate Route Frequency Ordered Stop   10/24/14 1100  metroNIDAZOLE (FLAGYL) IVPB 500 mg     500 mg 100 mL/hr over 60 Minutes Intravenous Every 8 hours 10/24/14 0930     10/24/14 1000  ciprofloxacin (CIPRO)  IVPB 400 mg     400 mg 200 mL/hr over 60 Minutes Intravenous Every 12 hours 10/24/14 0930        Assessment/Plan: Principal Problem:   Small bowel obstruction Active Problems:   Adenocarcinoma carcinomatosis   Postoperative intra-abdominal abscess   Intractable pain  Xray reviewed. psbo improved. Contrast in colon. No dilated loops Agree with clears Ambulate, IS  Leighton Ruff. Redmond Pulling, MD, FACS General, Bariatric, & Minimally Invasive Surgery [facility], [place]   LOS: 1 day    Gayland Curry 10/25/2014

## 2014-10-25 NOTE — Progress Notes (Signed)
[facility] PROGRESS NOTE  Wesley Dillon HKV:[id number] DOB: December 15, 1945 DOA: 10/24/2014 PCP: Gerrit Heck, MD  Brief Summary  Wesley Dillon is a 69 y.o. 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 He's had multiple issues with bowel obstruction and returns to the hospital for intractable abdominal pain. He had one episode yesterday which lasted 3 hours and improved with oral pain medication. The pain recurred in the middle of the night and did not resolve pain medications and therefore he presented to the hospital this morning. The pain has resolved with IV Dilaudid. He has not had any vomiting. He had a normal bowel movement yesterday. His abdomen is distended.   CT scan with contrast reveals a new moderately high-grade small bowel obstruction with a transition zone in the right lower quadrant, increased volume of peritoneal ascites and an unchanged pigtail catheter with tiny residual collection around the catheter and a moderately large left pleural effusion.  Assessment/Plan  Small bowel obstruction, had spontaneous BM and KUB appears to have resolved obstruction today - advance to [facility]D - nothing by mouth and continue IV Dilaudid for abdominal pain  Adenocarcinoma carcinomatosis -Next chemotherapy due on Wednesday-had been on hold due to intra-abdominal abscesses  Postoperative intra-abdominal abscess -Being managed with a drain by interventional radiology-abscesses have resolved but they're suspecting that he may have a small anastomotic leak and they have recommended he continue Cipro and Flagyl-appointment for drain check his on the 17th-on CT scan today, there is no fluid collection noted around the drain and therefore it is being drained adequately  Left pleural effusion and ascites - He  has been controlling this with Lasix supplemented with potassium as outpatient - resume lasix and potassium supplementation  Chronic diarrhea, stable, resume lomotil tomorrow if bowel obstruction resolved  Hypokalemia, resolved with IV potassium repletion  Anorexia, improved with megace.  -  Resume megace  Diet:  CLD Access:  port IVF:  off Proph:  lovenox  Code Status: DNR Family Communication: patient and his wife Disposition Plan: pending resolution of bowel obstruction   Consultants:  General surgery  Procedures:  CT abd/pelvis:  High grade bowel obstruction  KUB 7/3:  No evidence of obstruction  Antibiotics:  none   HPI/Subjective:  Abdominal pain improved.  Had BM this morning.  Wants to try clear liquids    Objective: Filed Vitals:   10/24/14 0840 10/24/14 1347 10/24/14 2224 10/25/14 0520  BP: 116/76 112/66 121/73 121/62  Pulse: 87 96 96 98  Temp: 97.6 F (36.4 C) 97.5 F (36.4 C) 98.6 F (37 C) 98.5 F (36.9 C)  TempSrc: Oral Oral Oral Oral  Resp: 20 20 20 18   Height: 6\' 1"  (1.854 m)     Weight: 74.707 kg (164 lb 11.2 oz)     SpO2: 97% 100% 95% 97%    Intake/Output Summary (Last 24 hours) at 10/25/14 1227 Last data filed at 10/25/14 0815  Gross per 24 hour  Intake 1378.75 ml  Output    926 ml  Net 452.75 ml   Filed Weights   10/24/14 0840  Weight: 74.707 kg (164 lb 11.2 oz)   Body mass index is 21.73 kg/(m^2).  Exam:   General:  Adult male, No acute distress  HEENT:  NCAT, MMM  Cardiovascular:  RRR, nl S1, S2 no mrg, 2+ pulses, warm extremities  Respiratory:  Diminished at bilateral bases, no rales or rhonchi, no increased WOB  Abdomen:   NABS, soft, mildly distended, nontender. Drain in the RLQ with clear yellow-green fluid.  No erythema  MSK:   Normal tone and bulk, 1+ pitting bilateral LEE  Neuro:  Grossly intact  Data Reviewed: Basic Metabolic Panel:  Recent Labs Lab 10/19/14 0803 10/24/14 0431 10/25/14 0544  NA  131* 131* 131*  K 3.9 3.3* 3.6  CL  --  96* 98*  CO2 24 24 24   GLUCOSE 97 104* 84  BUN 6.5* 9 6  CREATININE 0.7 0.49* 0.60*  CALCIUM 7.9* 8.3* 7.9*   Liver Function Tests:  Recent Labs Lab 10/19/14 0803 10/24/14 0431  AST 30 47*  ALT 16 18  ALKPHOS 756* 842*  BILITOT 0.70 1.3*  PROT 6.1* 7.2  ALBUMIN 1.5* 2.1*    Recent Labs Lab 10/24/14 0431  LIPASE 26   No results for input(s): AMMONIA in the last 168 hours. CBC:  Recent Labs Lab 10/19/14 0803 10/24/14 0431 10/25/14 0544  WBC 9.8 11.2* 6.2  NEUTROABS 7.1* 8.3*  --   HGB 8.4* 8.6* 7.8*  HCT 25.2* 27.5* 24.7*  MCV 84.2 87.0 87.9  PLT 440* 575* 432*    Recent Results (from the past 240 hour(s))  TECHNOLOGIST REVIEW     Status: None   Collection Time: 10/19/14  8:03 AM  Result Value Ref Range Status   Technologist Review Occ Metas and Myelocytes present  Final     Studies: Ct Abdomen Pelvis W Contrast  10/24/2014   CLINICAL DATA:  Abdominal pain. Recent percutaneous drainage of a right abdominal abscess. History of peritoneal carcinomatosis with prior right hemicolectomy.  EXAM: CT ABDOMEN AND PELVIS WITH CONTRAST  TECHNIQUE: Multidetector CT imaging of the abdomen and pelvis was performed using the standard protocol following bolus administration of intravenous contrast.  CONTRAST:  13mL OMNIPAQUE IOHEXOL 300 MG/ML SOLN, 131mL OMNIPAQUE IOHEXOL 300 MG/ML SOLN  COMPARISON:  10/20/2014, 10/05/2014, 09/08/2014  FINDINGS: There is marked dilatation of proximal and mid small bowel with abrupt transition to decompressed distal small bowel. The transition point is in the right lower quadrant. This has the appearance of a moderately high-grade small bowel obstruction. The colon is decompressed.  There is a moderate volume peritoneal ascites, increased. The right pigtail catheter abscess drain is unchanged in position. There is a minute peripherally enhancing collection around the catheter, measuring only 9 mm in depth by  about 5 cm in longest dimension. This is unchanged or slightly smaller than on 10/20/2014. There is no change in the moderate bile duct dilatation. No focal liver lesions are evident. There is no change in the unremarkable appearances of the spleen, pancreas, adrenals and kidneys with the exception of a 2 cm cyst at the posterior aspect of the left kidney. There is no free intraperitoneal air. There is a moderately large left effusion and a small right effusion. There is slight atelectatic lung base opacity adjacent to the pleural fluid collections. This is unchanged.  IMPRESSION: 1. New moderately high-grade small bowel obstruction with transition point in the right lower quadrant. 2. Increased volume of peritoneal ascites 3. Unchanged position of the pigtail catheter with a tiny residual collection around the catheter measuring about 0.9 x 5 cm 4. Moderately large left pleural effusion and small right pleural effusion.   Electronically Signed   By: Andreas Newport M.D.   On: 10/24/2014 06:35   Dg Abd Portable 1v  10/25/2014   CLINICAL DATA:  Follow-up small bowel obstruction  EXAM: PORTABLE ABDOMEN - 1 VIEW  COMPARISON:  10/08/2014  FINDINGS: Nonobstructive bowel gas pattern. Drainage catheter in the right abdomen, stable. No supine evidence of free air. No organomegaly or suspicious calcification. No acute bony abnormality.  IMPRESSION: Nonobstructive bowel gas pattern.   Electronically Signed   By: Rolm Baptise M.D.   On: 10/25/2014 07:51    Scheduled Meds: . ciprofloxacin  400 mg Intravenous Q12H  . enoxaparin (LOVENOX) injection  40 mg Subcutaneous Q24H  . metronidazole  500 mg Intravenous Q8H  . pantoprazole (PROTONIX) IV  40 mg Intravenous QHS   Continuous Infusions:   Principal Problem:   Small bowel obstruction Active Problems:   Adenocarcinoma carcinomatosis   Postoperative intra-abdominal abscess   Intractable pain    Time spent: 30 min    Aniqa Hare, [place]  [facility] Pager [phone]. If 7PM-7AM, please contact night-coverage at [email or web address], password [facility] 10/25/2014, 12:27 PM  LOS: 1 day

## 2014-10-26 ENCOUNTER — Inpatient Hospital Stay (HOSPITAL_COMMUNITY): Payer: PPO

## 2014-10-26 DIAGNOSIS — R52 Pain, unspecified: Secondary | ICD-10-CM

## 2014-10-26 MED ORDER — FUROSEMIDE 20 MG PO TABS: 60.0000 mg | ORAL_TABLET | Freq: Every day | ORAL | Status: DC

## 2014-10-26 MED ORDER — HEPARIN SOD (PORK) LOCK FLUSH 100 UNIT/ML IV SOLN
500.0000 [IU] | INTRAVENOUS | Status: AC | PRN
  Administered 2014-10-26: 500 [IU]

## 2014-10-26 NOTE — Discharge Summary (Signed)
Physician Discharge Summary  Wesley Dillon BPZ:[id number] DOB: 1945/05/18 DOA: 10/24/2014  PCP: Gerrit Heck, MD  Admit date: 10/24/2014 Discharge date: 10/26/2014  Recommendations for Outpatient Follow-up:  1. Follow-up with Dr. Excell Seltzer on Friday 2. F/u with IR drain clinic at already scheduled appointment.  [phone] 3. Dr. [name] in 1-2 weeks for CBC, BMP 4. Advised to use a full liquid diet until the end of the week and return to the hospital if he has worsening abdominal distention, pain  Discharge Diagnoses:  Principal Problem:   Small bowel obstruction Active Problems:   Adenocarcinoma carcinomatosis   Postoperative intra-abdominal abscess   Intractable pain   Discharge Condition: Stable, improved  Diet recommendation: Full liquids with supplements  Wt Readings from Last 3 Encounters:  10/24/14 74.707 kg (164 lb 11.2 oz)  10/19/14 79.334 kg (174 lb 14.4 oz)  10/13/14 81.466 kg (179 lb 9.6 oz)    History of present illness:   Wesley Dillon is a 69 y.o. 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 He has had multiple issues with bowel obstruction and returned to the hospital for intractable abdominal pain that did not improve with oral narcotics.  He denied vomiting and had a normal bowel movement the day prior to presentation.  His abdomen was distended and CT scan with contrast revealed a new moderately high-grade small bowel obstruction with a transition zone in the right lower quadrant, increased volume of peritoneal ascites and an unchanged pigtail catheter with tiny residual collection around the catheter.  He was incidentally noted to have a moderately large left pleural effusion.  Hospital Course:   Small bowel obstruction, initially made NPO and started on IVF.  He had a  spontaneous bowel movement within 24 hours of admission and two follow up KUBs demonstrated resolved obstruction.  He was able to tolerate a full liquid diet and was discharged home to follow up with General Surgery on Friday.  He will use full liquids until later this week and if still doing well, he may transition to soft foods.    Adenocarcinoma carcinomatosis, next chemotherapy was previously scheduled for Wednesday but has had been on hold due to intra-abdominal abscesses.  Postoperative intra-abdominal abscess, drain put out some purulent and mildly blood material during hospitalization.  He continued ciprofloxacin and flagyl and he should continue them until his follow up appointment at the drain clinic.  He should call their office if he has questions or concerns about his drain.    Left pleural effusion and ascites, he has been controlling this with Lasix supplemented with potassium as outpatient.  Advised to continue this regimen at home.   1.  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    Hypokalemia, resolved with IV potassium repletion  Anorexia, improved with megace. Resume megace  Consultants: 2. General surgery  Procedures:  CT abd/pelvis: High grade bowel obstruction  KUB 7/3: No evidence of obstruction  Antibiotics:  none  Discharge Exam: Filed Vitals:   10/26/14 0529  BP: 114/69  Pulse: 96  Temp: 98.7 F (37.1 C)  Resp: 20   Filed Vitals:   10/25/14 0520 10/25/14 1317 10/25/14 2253 10/26/14 0529  BP: 121/62 108/68 126/75 114/69  Pulse: 98 91 90 96  Temp: 98.5 F (36.9 C) 98.3 F (36.8 C) 98.9 F (37.2 C) 98.7 F (37.1  C)  TempSrc: Oral Oral Oral Oral  Resp: 18 18 20 20   Height:      Weight:      SpO2: 97% 97% 96% 95%    3. General: Adult male, No acute distress 4. HEENT: NCAT, MMM 5. Cardiovascular: RRR, nl S1, S2 no mrg, 2+ pulses, warm extremities 6. Respiratory: Diminished at bilateral bases, no rales  or rhonchi, no increased WOB 7. Abdomen: NABS, soft, mildly distended, nontender.  Drain in the RLQ with pink cloudy fluid and some yellow-green clear fluid. No erythema 8. MSK: Normal tone and bulk, 1+ pitting bilateral LEE 9. Neuro: Grossly intact  Discharge Instructions      Discharge Instructions    Call MD for:  difficulty breathing, headache or visual disturbances    Complete by:  As directed      Call MD for:  extreme fatigue    Complete by:  As directed      Call MD for:  hives    Complete by:  As directed      Call MD for:  persistant dizziness or light-headedness    Complete by:  As directed      Call MD for:  persistant nausea and vomiting    Complete by:  As directed      Call MD for:  redness, tenderness, or signs of infection (pain, swelling, redness, odor or green/yellow discharge around incision site)    Complete by:  As directed      Call MD for:  severe uncontrolled pain    Complete by:  As directed      Call MD for:  temperature >100.4    Complete by:  As directed      Discharge instructions    Complete by:  As directed   Please continue your antibiotics and all of your other medications as before.  Please eat a "full liquids" diet until the end of the week.  If by Thursday/Friday, you are still feeling well, you may change to soft, low fiber foods.  Please follow up with Dr. Excell Seltzer on Friday and with the drain clinic and Dr. [name] at your already scheduled appointments.  If you have worsening abdominal pain, distension, or other concerning symptoms, please return to the hospital.     Increase activity slowly    Complete by:  As directed             Medication List    STOP taking these medications        sodium chloride 0.9 % injection      TAKE these medications        acetaminophen 500 MG tablet  Commonly known as:  TYLENOL  Take 1,000 mg by mouth every 6 (six) hours as needed for moderate pain or headache.     ciprofloxacin 500 MG  tablet  Commonly known as:  CIPRO  Take 1 tablet (500 mg total) by mouth 2 (two) times daily.     diphenoxylate-atropine 2.5-0.025 MG per tablet  Commonly known as:  LOMOTIL  Take 1 tablet by mouth 4 (four) times daily as needed for diarrhea or loose stools.     furosemide 20 MG tablet  Commonly known as:  LASIX  Take 3 tablets (60 mg total) by mouth daily.     lidocaine 5 %  Commonly known as:  LIDODERM  Place 1 patch onto the skin daily. Remove & Discard patch within 12 hours or as directed by MD     lidocaine-prilocaine  cream  Commonly known as:  EMLA  Apply 1 application topically as needed. Apply to PAC 1-2 hours prior to stick and cover with plastic wrap     megestrol 40 MG/ML suspension  Commonly known as:  MEGACE  Take 10 mLs (400 mg total) by mouth daily.     metroNIDAZOLE 500 MG tablet  Commonly known as:  FLAGYL  Take 1 tablet (500 mg total) by mouth 3 (three) times daily.     ondansetron 4 MG disintegrating tablet  Commonly known as:  ZOFRAN ODT  Take 1 tablet (4 mg total) by mouth every 4 (four) hours as needed for nausea or vomiting.     oxyCODONE 5 MG immediate release tablet  Commonly known as:  Oxy IR/ROXICODONE  Take 1-2 tablets (5-10 mg total) by mouth every 4 (four) hours as needed for severe pain.     pantoprazole 40 MG tablet  Commonly known as:  PROTONIX  Take 1 tablet (40 mg total) by mouth 2 (two) times daily.     potassium chloride 10 MEQ tablet  Commonly known as:  K-DUR  Take 1 tablet (10 mEq total) by mouth 3 (three) times daily.     PRESERVISION AREDS PO  Take 2 capsules by mouth every morning.     primidone 250 MG tablet  Commonly known as:  MYSOLINE  Take 1 tablet (250 mg total) by mouth at bedtime.     prochlorperazine 10 MG tablet  Commonly known as:  COMPAZINE  Take 1 tablet (10 mg total) by mouth every 6 (six) hours as needed for nausea.     saccharomyces boulardii 250 MG capsule  Commonly known as:  FLORASTOR  Take 1  capsule (250 mg total) by mouth 2 (two) times daily.     sucralfate 1 G tablet  Commonly known as:  CARAFATE  Take 1 tablet (1 g total) by mouth 4 (four) times daily -  with meals and at bedtime. Chew prior to swallowing       Follow-up Information    Follow up with Gerrit Heck, MD.   Specialty:  Family Medicine   Why:  As needed   Contact information:   [place] [employer] [place] [address] [phone]       Follow up with Edward Jolly, MD On 10/30/2014.   Specialty:  General Surgery   Contact information:   [address] [place] [address] [phone]       Follow up with Betsy Coder, MD In 2 weeks.   Specialty:  Oncology   Contact information:   [place] [address] [phone]       Follow up with IR Drain clinic In 2 weeks.   Why:  already scheduled appointment   Contact information:   [phone]       The results of significant diagnostics from this hospitalization (including imaging, microbiology, ancillary and laboratory) are listed below for reference.    Significant Diagnostic Studies: Dg Chest 2 View  10/11/2014   CLINICAL DATA:  Markham Dumlao of breath and weakness  EXAM: CHEST  2 VIEW  COMPARISON:  10/05/2014  FINDINGS: Right jugular Port-A-Cath is stable. Bilateral pleural effusions have increased. No pneumothorax. Cardiomegaly.  IMPRESSION: Increasing bilateral pleural effusions.   Electronically Signed   By: Marybelle Killings M.D.   On: 10/11/2014 08:22   Dg Chest 2 View  10/05/2014   CLINICAL DATA:  Fever and shortness of breath beginning today. History of colon  carcinoma.  EXAM: CHEST  2 VIEW  COMPARISON:  Single view of the chest 08/18/2014.  FINDINGS: Port-A-Cath is again seen. There are small to moderate bilateral pleural effusions, greater on the right. Basilar airspace disease is also worse on the right. No pneumothorax is identified. Heart size is normal.  IMPRESSION: Right greater than left  small to moderate bilateral pleural effusions with associated basilar airspace disease, likely atelectasis.   Electronically Signed   By: Inge Rise M.D.   On: 10/05/2014 14:28   Dg Abd 1 View  10/26/2014   CLINICAL DATA:  Small bowel obstruction.  EXAM: ABDOMEN - 1 VIEW  COMPARISON:  10/25/2014  FINDINGS: Stable drainage catheter in the right abdomen. Stable surgical changes in the right abdomen. Scattered air, contrast in stool in the colon. No findings for small bowel obstruction or free air.  IMPRESSION: Stable abdominal radiograph. No findings for small bowel obstruction or free air.   Electronically Signed   By: Marijo Sanes M.D.   On: 10/26/2014 09:11   Ct Angio Chest Pe W/cm &/or Wo Cm  10/11/2014   CLINICAL DATA:  Left-sided chest pain and shortness of breath. Reported history of carcinomatosis.  EXAM: CT ANGIOGRAPHY CHEST WITH CONTRAST  TECHNIQUE: Multidetector CT imaging of the chest was performed using the standard protocol during bolus administration of intravenous contrast. Multiplanar CT image reconstructions and MIPs were obtained to evaluate the vascular anatomy.  CONTRAST:  175mL OMNIPAQUE IOHEXOL 350 MG/ML SOLN  COMPARISON:  10/11/2014 chest radiograph, chest CT 10/05/2014  FINDINGS: Mediastinum/Nodes: Exam quality is suboptimal for evaluation for pulmonary embolism due to bolus timing and inhomogeneity of the contrast bolus. Allowing for this, no large central filling defect is identified to suggest pulmonary arterial embolism.  Ascending aortic aneurysm measuring 4.8 cm image 58 is stable. Moderate atheromatous coronary arterial and aortic calcification. Small pericardial effusion is stable. No new lymphadenopathy.  Lungs/Pleura: Areas of subpleural patchy airspace opacity are noted, increased since previously in the left upper lobe anteriorly, for example image 55, as well as associated compressive atelectasis. Central airways are patent. No measurable nodule or mass.  Upper  abdomen: Ascites is visualized.  Musculoskeletal: No acute osseous abnormality or lytic or sclerotic osseous lesion.  Review of the MIP images confirms the above findings.  IMPRESSION: Suboptimal exam quality for evaluation for pulmonary embolism without evidence for central large pulmonary arterial filling defect.  No significant change in 4.8 cm ascending aortic aneurysm.  Increased, now large, bilateral pleural effusions with associated compressive atelectasis.  Increased subpleural reticular airspace patchy opacities particularly in the left upper lobe anteriorly. These are nonspecific and could represent very early pneumonia but are amenable to followup at future presumed restaging studies.   Electronically Signed   By: Conchita Paris M.D.   On: 10/11/2014 10:27   Ct Angio Chest Pe W/cm &/or Wo Cm  10/05/2014   CLINICAL DATA:  Acute onset of generalized abdominal pain and weakness. Hypotension. Diarrhea. Hypoxia. Significantly elevated D-dimer. Current history of adenocarcinoma with carcinomatosis, status post chemotherapy. Initial encounter.  EXAM: CT ANGIOGRAPHY CHEST  CT ABDOMEN AND PELVIS WITH CONTRAST  TECHNIQUE: Multidetector CT imaging of the chest was performed using the standard protocol during bolus administration of intravenous contrast. Multiplanar CT image reconstructions and MIPs were obtained to evaluate the vascular anatomy. Multidetector CT imaging of the abdomen and pelvis was performed using the standard protocol during bolus administration of intravenous contrast.  CONTRAST:  122mL OMNIPAQUE IOHEXOL 350 MG/ML SOLN  COMPARISON:  Abdominal radiographs performed earlier today at 2:12 p.m., and CT of the abdomen and pelvis performed 09/08/2014. CT of the chest performed 03/16/2014  FINDINGS: CTA CHEST FINDINGS  There is no evidence of pulmonary embolus.  Moderate bilateral pleural effusions are seen, with underlying partial consolidation of both lower lung lobes. The expanded portions of  both lungs appear relatively clear. There is no evidence of pneumothorax. No masses are identified; no abnormal focal contrast enhancement is seen.  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 The great vessels are grossly unremarkable in appearance. No pericardial effusion is identified. No definite mediastinal lymphadenopathy is seen.  Scattered coronary artery calcifications are noted. There is suggestion of mild diffuse wall thickening along the esophagus, with associated edema, raising concern for some degree of esophagitis. No axillary lymphadenopathy is seen. The thyroid gland is unremarkable in appearance. A right-sided chest port is noted ending about the distal SVC.  No acute osseous abnormalities are seen.  CT ABDOMEN and PELVIS FINDINGS  There appears to be a very large peripherally enhancing evolving abscess at the right side of the abdomen, partially contiguous with additional collections of fluid throughout the remainder of the abdomen and pelvis. This measures approximately 30.9 x 12.2 x 20.0 cm, extending from the level of the liver down into the pelvis. This contains a large amount of fluid and air. Given that the surgery was a month ago, this is unlikely to reflect residual air. There is no evidence of extravasation of contrast into the collection to suggest a perforated viscus, though it cannot be excluded; contrast appears to extend normally through the visualized bowel anastomoses and into the sigmoid colon. A gas-producing organism is thought to be the most likely etiology of the air.  Underlying moderate to large volume ascites is noted within the abdomen and pelvis; the underlying ascites has lower attenuation than the fluid within the abscess.  There appears to be prominence of intrahepatic biliary ducts and underlying periportal edema, though the common bile duct is not definitely enlarged. This is of  uncertain significance. 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 The spleen is unremarkable in appearance. The superior aspect of the abscess demonstrates some degree of mass effect on the inferior aspect of the liver. A likely small stone is noted along the lateral gallbladder wall. The gallbladder is otherwise grossly unremarkable. The pancreas and adrenal glands are unremarkable.  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 The stomach is grossly unremarkable in appearance, though difficult to fully assess at the level of the hepatic hilum.  A 2.2 cm cyst is noted at the upper pole of the left kidney. The kidneys are otherwise unremarkable. There is no evidence of hydronephrosis. No renal or ureteral stones are seen. No perinephric stranding is appreciated.  There is vague diffuse persistent or recurrent edema involving multiple small bowel loops at the mid and lower abdomen. There is also diffuse nodular mucosal thickening along the remaining ileum extending to the level of the patient's ileocolic anastomosis, and more mild mucosal edema along the distal descending and sigmoid colon. Some of these bowel loops are largely surrounded by the abscess. This may reflect lymphatic congestion, ischemia or infection. Underlying tumor infiltration cannot be excluded, as previously noted. The edematous bowel loops still demonstrate minimal enhancement, without evidence for dead bowel at this time.  No acute vascular abnormalities are seen. Mild scattered calcification is noted  along the abdominal aorta and its branches.  The bladder is mildly distended and grossly unremarkable in appearance. The prostate is enlarged, measuring 5.5 cm in transverse dimension. No inguinal lymphadenopathy is seen.  No acute osseous abnormalities are identified.  Review of the MIP images confirms the above findings.  IMPRESSION: 1. Very large peripherally enhancing evolving abscess at the right side of the abdomen,  partially contiguous with additional collections of fluid throughout the remainder of the abdomen and pelvis. This measures 30.9 x 12.2 x 20.0 cm, extending from the level of the liver down into the pelvis, and contains a large amount of fluid and air. No evidence of extravasation of contrast into the collection to suggest a perforated viscus, though it cannot be excluded. Contrast appears to extend normally through the visualized bowel anastomoses. A gas-producing organism is thought to be the most likely etiology of the air. Given the size of the abscess and surrounding ascites, this is likely too large for successful interventional drainage. Surgical consultation is suggested, as deemed clinically appropriate. 2. Underlying moderate to large volume ascites within the abdomen and pelvis. 3. Vague persistent or recurrent diffuse edema involving multiple small bowel loops at the mid and lower abdomen. Diffuse nodular mucosal thickening along the remaining ileum extending to the ileocolic anastomosis, and more mild mucosal edema along the distal descending and sigmoid colon. This may reflect lymphatic congestion, ischemia or infection. As previously noted, underlying tumor infiltration cannot be excluded. The edematous bowel loops still demonstrate minimal enhancement, without definite dead bowel at this time. 4. No evidence of pulmonary embolus. 5. Moderate bilateral pleural effusions, with underlying partial consolidation of both lower lung lobes. 6. Suggestion of mild diffuse wall thickening along the esophagus, with associated edema, raising concern for some degree of esophagitis. 7. Prominence of the intrahepatic biliary ducts and underlying periportal edema, somewhat more prominent than on the prior study, though the common bile duct is not definitely enlarged. This is of uncertain significance, though it could reflect some degree of obstruction at the hepatic hilum. Underlying cavernous transformation of the  portal vein noted. 8. 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 9. 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 If and when deemed clinically appropriate, would recommend semi-annual imaging followup by CTA or MRA, with elective referral to cardiothoracic surgery if not already obtained. This recommendation follows 2010 ACCF/AHA/AATS/ACR/ASA/SCA/SCAI/SIR/STS/SVM Guidelines for the Diagnosis and Management of Patients With Thoracic Aortic Disease. Circulation. 2010; 121: Q759-F638 10. Scattered coronary artery calcifications seen. 11. Cholelithiasis; gallbladder otherwise unremarkable. 12. Small left renal cyst noted. 13. Mild scattered calcification along the abdominal aorta and its branches. 14. Enlarged prostate noted.  These results were called by telephone at the time of interpretation on 10/05/2014 at 6:50 pm to Dr. Hosie Poisson, who verbally acknowledged these results.   Electronically Signed   By: Garald Balding M.D.   On: 10/05/2014 19:11   Ct Abdomen Pelvis W Contrast  10/24/2014   CLINICAL DATA:  Abdominal pain. Recent percutaneous drainage of a right abdominal abscess. History of peritoneal carcinomatosis with prior right hemicolectomy.  EXAM: CT ABDOMEN AND PELVIS WITH CONTRAST  TECHNIQUE: Multidetector CT imaging of the abdomen and pelvis was performed using the standard protocol following bolus administration of intravenous contrast.  CONTRAST:  61mL OMNIPAQUE IOHEXOL 300 MG/ML SOLN, 150mL OMNIPAQUE IOHEXOL 300 MG/ML SOLN  COMPARISON:  10/20/2014, 10/05/2014, 09/08/2014  FINDINGS: There is marked dilatation of proximal and mid small bowel with abrupt transition to  decompressed distal small bowel. The transition point is in the right lower quadrant. This has the appearance of a moderately high-grade small bowel obstruction. The colon is decompressed.  There is a moderate volume peritoneal ascites, increased. The right pigtail catheter abscess drain is unchanged in  position. There is a minute peripherally enhancing collection around the catheter, measuring only 9 mm in depth by about 5 cm in longest dimension. This is unchanged or slightly smaller than on 10/20/2014. There is no change in the moderate bile duct dilatation. No focal liver lesions are evident. There is no change in the unremarkable appearances of the spleen, pancreas, adrenals and kidneys with the exception of a 2 cm cyst at the posterior aspect of the left kidney. There is no free intraperitoneal air. There is a moderately large left effusion and a small right effusion. There is slight atelectatic lung base opacity adjacent to the pleural fluid collections. This is unchanged.  IMPRESSION: 1. New moderately high-grade small bowel obstruction with transition point in the right lower quadrant. 2. Increased volume of peritoneal ascites 3. Unchanged position of the pigtail catheter with a tiny residual collection around the catheter measuring about 0.9 x 5 cm 4. Moderately large left pleural effusion and small right pleural effusion.   Electronically Signed   By: Andreas Newport M.D.   On: 10/24/2014 06:35   Ct Abdomen Pelvis W Contrast  10/20/2014   CLINICAL DATA:  History of adenocarcinoma with peritoneal carcinomatosis, post exploratory laparotomy, resection of terminal ileum and right colon and small bowel bypass performed on 09/09/2014).  CTs of the abdomen pelvis performed 10/05/2014 demonstrated a large (approximately 30.1) cm peripherally enhancing fluid collection, for which the patient underwent a post CT-guided percutaneous drainage catheter placement later that same day.  Patient presents to the Interventional Radiology drain Clinic for repeat CT of the abdomen pelvis and drain evaluation.  EXAM: CT ABDOMEN AND PELVIS WITH CONTRAST  TECHNIQUE: Multidetector CT imaging of the abdomen and pelvis was performed using the standard protocol following bolus administration of intravenous contrast.   CONTRAST:  62mL OMNIPAQUE IOHEXOL 300 MG/ML  SOLN  COMPARISON:  CT abdomen pelvis - 10/05/2014; 08/29/2014  FINDINGS: Unchanged positioning of right-sided percutaneous drainage catheter placement with tiny (approximately 5.3 x 1.5 x 3.9 cm) residual peripherally enhancing air and fluid collecting located within the right mid hemi abdomen adjacent to the enteric anastomotic suture line.  Re- demonstrated small to moderate amount of intra-abdominal ascites. No definitive new defined intra-abdominal fluid collections.  Post right hemicolectomy without evidence of enteric obstruction. Moderate colonic stool burden. There is unchanged minimal amount of patulous distension about the small bowel enteric anastomotic site, again without evidence of enteric obstruction (representative image 75, series 2, coronal image 43, series 5). No pneumoperitoneum, pneumatosis or portal venous gas.  No bulky retroperitoneal, mesenteric, pelvic or inguinal lymphadenopathy.  Normal hepatic contour. Common bile duct remains enlarged measuring 1.2 cm in greatest oblique coronal diameter (image 76, series 5) with associated mild to moderate centralized intrahepatic biliary duct dilatation. 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 No discrete hepatic lesions.  There is symmetric enhancement and excretion of the bilateral kidneys. Unchanged approximately 1.5 cm partially exophytic hypo attenuating cyst arising from the posterior superior aspect of the left kidney (15, series 7). No discrete right-sided renal lesions. No definite renal stones this postcontrast examination. No urinary obstruction or perinephric stranding.  Normal appearance of the bilateral adrenal glands, pancreas and spleen.  Moderate amount of calcified atherosclerotic  plaque within a normal caliber abdominal aorta. The major branch vessels of the abdominal aorta appear patent on this non CTA examination.  Normal appearance of the pelvic organs. Normal  appearance of the urinary bladder given degree distention.  Limited visualization the lower thorax demonstrates minimal decrease in small residual right-sided pleural effusion. Unchanged small to moderate size left-sided effusion. Grossly unchanged subsegmental atelectasis within in the right lower lobe and caudal segment of the lingula.  Normal heart size. Coronary artery calcifications. Calcifications within the aortic valve leaflets. There is suspected mild fusiform aneurysmal dilatation of the imaged proximal ascending thoracic aorta measuring approximately 4.7 cm in maximal oblique axial diameter (image 1, series 2).  No acute or aggressive osseous abnormalities. Mild-to-moderate multilevel lumbar spine DDD, worse at L2-L3 and L5-S1 with disc space height loss, endplate irregularity and sclerosis.  Mild diffuse body wall anasarca. Small bilateral mesenteric fat containing inguinal hernias.  IMPRESSION: 1. Interval near complete resolution of large right-sided abdominal abscess post percutaneous drainage catheter placement. The patient underwent a subsequent fluoroscopic guided drainage catheter injection. 2. Residual moderate volume intra-abdominal ascites without new definable / drainable intra-abdominal fluid collection. 3. Stable sequela of right hemicolectomy and small bowel enteric anastomosis without evidence of enteric obstruction. 4. 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 5. Unchanged suspected mild aneurysmal dilatation of the ascending thoracic aorta measuring approximately 4.7 cm in maximal diameter, incompletely evaluated.   Electronically Signed   By: Sandi Mariscal M.D.   On: 10/20/2014 09:40   Ct Abdomen Pelvis W Contrast  10/05/2014   CLINICAL DATA:  Acute onset of generalized abdominal  pain and weakness. Hypotension. Diarrhea. Hypoxia. Significantly elevated D-dimer. Current history of adenocarcinoma with carcinomatosis, status post chemotherapy. Initial encounter.  EXAM: CT ANGIOGRAPHY CHEST  CT ABDOMEN AND PELVIS WITH CONTRAST  TECHNIQUE: Multidetector CT imaging of the chest was performed using the standard protocol during bolus administration of intravenous contrast. Multiplanar CT image reconstructions and MIPs were obtained to evaluate the vascular anatomy. Multidetector CT imaging of the abdomen and pelvis was performed using the standard protocol during bolus administration of intravenous contrast.  CONTRAST:  138mL OMNIPAQUE IOHEXOL 350 MG/ML SOLN  COMPARISON:  Abdominal radiographs performed earlier today at 2:12 p.m., and CT of the abdomen and pelvis performed 09/08/2014. CT of the chest performed 03/16/2014  FINDINGS: CTA CHEST FINDINGS  There is no evidence of pulmonary embolus.  Moderate bilateral pleural effusions are seen, with underlying partial consolidation of both lower lung lobes. The expanded portions of both lungs appear relatively clear. There is no evidence of pneumothorax. No masses are identified; no abnormal focal contrast enhancement is seen.  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 The great vessels are grossly unremarkable in appearance. No pericardial effusion is identified. No definite mediastinal lymphadenopathy is seen.  Scattered coronary artery calcifications are noted. There is suggestion of mild diffuse wall thickening along the esophagus, with associated edema, raising concern for some degree of esophagitis. No axillary lymphadenopathy is seen. The thyroid gland is unremarkable in appearance. A right-sided chest port is noted ending about the distal SVC.  No acute osseous abnormalities are seen.  CT ABDOMEN and PELVIS FINDINGS  There appears to be a very large  peripherally enhancing evolving abscess  at the right side of the abdomen, partially contiguous with additional collections of fluid throughout the remainder of the abdomen and pelvis. This measures approximately 30.9 x 12.2 x 20.0 cm, extending from the level of the liver down into the pelvis. This contains a large amount of fluid and air. Given that the surgery was a month ago, this is unlikely to reflect residual air. There is no evidence of extravasation of contrast into the collection to suggest a perforated viscus, though it cannot be excluded; contrast appears to extend normally through the visualized bowel anastomoses and into the sigmoid colon. A gas-producing organism is thought to be the most likely etiology of the air.  Underlying moderate to large volume ascites is noted within the abdomen and pelvis; the underlying ascites has lower attenuation than the fluid within the abscess.  There appears to be prominence of intrahepatic biliary ducts and underlying periportal edema, though the common bile duct is not definitely enlarged. This is of uncertain significance. 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 The spleen is unremarkable in appearance. The superior aspect of the abscess demonstrates some degree of mass effect on the inferior aspect of the liver. A likely small stone is noted along the lateral gallbladder wall. The gallbladder is otherwise grossly unremarkable. The pancreas and adrenal glands are unremarkable.  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 The stomach is grossly unremarkable in appearance, though difficult to fully assess at the level of the hepatic hilum.  A 2.2 cm cyst is noted at the upper pole of the left kidney. The kidneys are otherwise unremarkable. There is no evidence of hydronephrosis. No renal or ureteral stones are seen. No perinephric stranding is appreciated.  There is vague diffuse persistent or recurrent edema involving multiple small bowel  loops at the mid and lower abdomen. There is also diffuse nodular mucosal thickening along the remaining ileum extending to the level of the patient's ileocolic anastomosis, and more mild mucosal edema along the distal descending and sigmoid colon. Some of these bowel loops are largely surrounded by the abscess. This may reflect lymphatic congestion, ischemia or infection. Underlying tumor infiltration cannot be excluded, as previously noted. The edematous bowel loops still demonstrate minimal enhancement, without evidence for dead bowel at this time.  No acute vascular abnormalities are seen. Mild scattered calcification is noted along the abdominal aorta and its branches.  The bladder is mildly distended and grossly unremarkable in appearance. The prostate is enlarged, measuring 5.5 cm in transverse dimension. No inguinal lymphadenopathy is seen.  No acute osseous abnormalities are identified.  Review of the MIP images confirms the above findings.  IMPRESSION: 1. Very large peripherally enhancing evolving abscess at the right side of the abdomen, partially contiguous with additional collections of fluid throughout the remainder of the abdomen and pelvis. This measures 30.9 x 12.2 x 20.0 cm, extending from the level of the liver down into the pelvis, and contains a large amount of fluid and air. No evidence of extravasation of contrast into the collection to suggest a perforated viscus, though it cannot be excluded. Contrast appears to extend normally through the visualized bowel anastomoses. A gas-producing organism is thought to be the most likely etiology of the air. Given the size of the abscess and surrounding ascites, this is likely too large for successful interventional drainage. Surgical consultation is suggested, as deemed clinically appropriate. 2. Underlying moderate to large volume ascites within the abdomen and pelvis. 3. Vague persistent or recurrent diffuse edema involving  multiple small bowel loops  at the mid and lower abdomen. Diffuse nodular mucosal thickening along the remaining ileum extending to the ileocolic anastomosis, and more mild mucosal edema along the distal descending and sigmoid colon. This may reflect lymphatic congestion, ischemia or infection. As previously noted, underlying tumor infiltration cannot be excluded. The edematous bowel loops still demonstrate minimal enhancement, without definite dead bowel at this time. 4. No evidence of pulmonary embolus. 5. Moderate bilateral pleural effusions, with underlying partial consolidation of both lower lung lobes. 6. Suggestion of mild diffuse wall thickening along the esophagus, with associated edema, raising concern for some degree of esophagitis. 7. Prominence of the intrahepatic biliary ducts and underlying periportal edema, somewhat more prominent than on the prior study, though the common bile duct is not definitely enlarged. This is of uncertain significance, though it could reflect some degree of obstruction at the hepatic hilum. Underlying cavernous transformation of the portal vein noted. 8. 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 9. 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 If and when deemed clinically appropriate, would recommend semi-annual imaging followup by CTA or MRA, with elective referral to cardiothoracic surgery if not already obtained. This recommendation follows 2010 ACCF/AHA/AATS/ACR/ASA/SCA/SCAI/SIR/STS/SVM Guidelines for the Diagnosis and Management of Patients With Thoracic Aortic Disease. Circulation. 2010; 121: G295-M841 10. Scattered coronary artery calcifications seen. 11. Cholelithiasis; gallbladder otherwise unremarkable. 12. Small left renal cyst noted. 13. Mild scattered calcification along the abdominal aorta and its branches. 14. Enlarged prostate noted.  These results were called by telephone at the time of interpretation on 10/05/2014 at 6:50 pm to Dr. Hosie Poisson,  who verbally acknowledged these results.   Electronically Signed   By: Garald Balding M.D.   On: 10/05/2014 19:11   Ir Sinus/fist Tube Chk-non Gi  10/20/2014   CLINICAL DATA:  History of adenocarcinoma with peritoneal carcinomatosis, post exploratory laparotomy, resection of terminal ileum and right colon and small bowel bypass performed on 09/09/2014).  CT of the abdomen pelvis performed 10/05/2014 demonstrated a large (approximately 30.1) cm peripherally enhancing fluid collection, for which the patient underwent a post CT-guided percutaneous drainage catheter placement later that same day.  Patient presents to the Interventional Radiology drain Clinic for repeat CT of the abdomen pelvis and drain evaluation.  Patient reports minimal (approximately 5-10 cc) of output from the percutaneous drainage catheter. The patient continues to flush the percutaneous drainage catheter.  CT the abdomen pelvis performed earlier same day demonstrates near complete resolution of the large right-sided intra-abdominal abscess post percutaneous drainage catheter placement.  CCS Attending:  Hoxworth  EXAM: SINUS TRACT INJECTION/FISTULOGRAM  COMPARISON:  CT abdomen pelvis - earlier same day ; 10/05/2014; CT the chest, abdomen pelvis - 09/08/2014; CT-guided percutaneous drainage catheter placement -10/05/2014  CONTRAST:  59mL OMNIPAQUE IOHEXOL 300 MG/ML  SOLN  FLUOROSCOPY TIME:  54 seconds (44 mGy)  TECHNIQUE: The patient was positioned supine on the fluoroscopy table.  A spot fluoroscopic image was obtained of the right mid hemi abdomen Ing and the existing percutaneous drainage catheter. Multiple spot fluoroscopic and radiographic images were obtained following the injection of a small amount of contrast via the existing percutaneous drainage catheter. Images were reviewed in the catheter was flushed with a small amount of saline and reconnected to a gravity bag. The patient tolerated the procedure well without immediate  postprocedural complication.  FINDINGS: Preprocedural spot fluoroscopic image demonstrates grossly unchanged positioning of previously placed percutaneous drainage catheter with tip coiled and locked over  the right mid hemi abdomen.  Contrast injection and demonstrates opacification of the residual decompressed abscess cavity. There is a extremely tiny residual fistulous connection to the adjacent colonic enteric anastomosis site within the adjacent right mid hemi abdomen.  There is a minimal amount of reflux along the catheter tract to the entrance site at the skin surface.  IMPRESSION: 1. Appropriately positioned and functioning percutaneous drainage catheter with near complete resolution/decompression of previously noted large right intra-abdominal abscess post percutaneous drainage catheter placement. 2. Tiny residual fistulous connection to the adjacent enteric anastomosis.  PLAN: The patient was instructed to no longer flush the percutaneous drainage catheter. He was instructed to continue to monitor the output from the percutaneous drain.  The patient will return to the [facility] interventional radiology drain clinic [phone]) during the week of 7/11 for drainage catheter evaluation and repeat fluoroscopic guided percutaneous drainage catheter injection.  CCS Attending:  Hoxworth   Electronically Signed   By: Sandi Mariscal M.D.   On: 10/20/2014 10:21   Dg Abd 2 Views  10/08/2014   CLINICAL DATA:  Recent small bowel obstruction, known abdominal carcinomatosis  EXAM: ABDOMEN - 2 VIEW  COMPARISON:  10/05/2014  FINDINGS: Scattered large and small bowel gas is noted. A drainage catheter is noted in the right mid abdomen. Bibasilar atelectatic changes with associated effusions are noted. No definitive free air is seen. No true obstructive changes are noted.  IMPRESSION: Bibasilar atelectasis and associated effusions similar to that seen on recent CT examination.  Right mid abdominal drainage  catheter in satisfactory position.  No true obstructive changes are noted at this time.   Electronically Signed   By: Inez Catalina M.D.   On: 10/08/2014 15:34   Dg Abd 2 Views  10/05/2014   CLINICAL DATA:  Fever. Shortness of breath. Colostomy revision and bowel resection may 2016.  EXAM: ABDOMEN - 2 VIEW  COMPARISON:  09/08/2014  FINDINGS: Airspace opacities probably from pleural effusions of the lung bases.  Nondependent collection of gas in the right abdomen with air-fluid level on the left-side-down lateral decubitus views ; this does not track around the inferior liver edge, and I do not see regular lines on the frontal projection, accordingly this may be gas within right-sided bowel, but I am not absolutely certain.  IMPRESSION: 1. Bibasilar pleural effusions with associated atelectasis/airspace opacity. 2. Air- fluid level non-dependently in the abdomen on the lateral decubitus view. The gas does not appear to track along the liver edge and accordingly is probably within dilated bowel, but I am not 295% certain that this is intraluminal. CT abdomen recommended. These results will be called to the ordering clinician or representative by the Radiologist Assistant, and communication documented in the PACS or zVision Dashboard.   Electronically Signed   By: Van Clines M.D.   On: 10/05/2014 14:34   Dg Abd Portable 1v  10/25/2014   CLINICAL DATA:  Follow-up small bowel obstruction  EXAM: PORTABLE ABDOMEN - 1 VIEW  COMPARISON:  10/08/2014  FINDINGS: Nonobstructive bowel gas pattern. Drainage catheter in the right abdomen, stable. No supine evidence of free air. No organomegaly or suspicious calcification. No acute bony abnormality.  IMPRESSION: Nonobstructive bowel gas pattern.   Electronically Signed   By: Rolm Baptise M.D.   On: 10/25/2014 07:51   Ct Image Guided Drainage Percut Cath  Peritoneal Retroperit  10/06/2014   CLINICAL DATA:  Abdominal carcinomatosis post laparotomy for bowel obstruction.  Now presents with large intra-abdominal abscess.  EXAM: CT GUIDED DRAINAGE OF PERITONEAL ABSCESS  ANESTHESIA/SEDATION: Intravenous Versed were administered as conscious sedation during continuous cardiorespiratory monitoring by the ICU RN, with a total moderate sedation time of less than 30 minutes.  PROCEDURE: The procedure, risks, benefits, and alternatives were explained to the patient. Questions regarding the procedure were encouraged and answered. The patient understands and consents to the procedure.  Select axial scans through the abdomen were obtained. The collection was localized and an appropriate skin entry site was determined and marked.  The operative field was prepped with Betadinein a sterile fashion, and a sterile drape was applied covering the operative field. A sterile gown and sterile gloves were used for the procedure. Local anesthesia was provided with 1% Lidocaine.  Under CT fluoroscopic guidance, a 19 gauge percutaneous entry needle was advanced into the collection. Purulent material returned. An Amplatz guidewire advanced easily into the collection. Tract dilated to allow placement of a 12 French pigtail catheter, placed within the central aspect of the collection. Catheter position confirmed on CT. Catheter secured externally with 0 Prolene suture and StatLock and placed to gravity bag. 1700 mL of fluid were removed during the procedure. A sample sent for Gram stain, culture and sensitivity.  COMPLICATIONS: None immediate  FINDINGS: Limited CT confirms large gas and fluid loculated collection in the right peritoneal cavity. 12 French abscess drain catheter placed under CT guidance.  IMPRESSION: 1. Technically successful CT-guided peritoneal abscess drain catheter placement.   Electronically Signed   By: Lucrezia Europe M.D.   On: 10/06/2014 09:20    Microbiology: Recent Results (from the past 240 hour(s))  TECHNOLOGIST REVIEW     Status: None   Collection Time: 10/19/14  8:03 AM  Result  Value Ref Range Status   Technologist Review Occ Metas and Myelocytes present  Final     Labs: Basic Metabolic Panel:  Recent Labs Lab 10/24/14 0431 10/25/14 0544  NA 131* 131*  K 3.3* 3.6  CL 96* 98*  CO2 24 24  GLUCOSE 104* 84  BUN 9 6  CREATININE 0.49* 0.60*  CALCIUM 8.3* 7.9*   Liver Function Tests:  Recent Labs Lab 10/24/14 0431  AST 47*  ALT 18  ALKPHOS 842*  BILITOT 1.3*  PROT 7.2  ALBUMIN 2.1*    Recent Labs Lab 10/24/14 0431  LIPASE 26   No results for input(s): AMMONIA in the last 168 hours. CBC:  Recent Labs Lab 10/24/14 0431 10/25/14 0544  WBC 11.2* 6.2  NEUTROABS 8.3*  --   HGB 8.6* 7.8*  HCT 27.5* 24.7*  MCV 87.0 87.9  PLT 575* 432*   Cardiac Enzymes: No results for input(s): CKTOTAL, CKMB, CKMBINDEX, TROPONINI in the last 168 hours. BNP: BNP (last 3 results)  Recent Labs  10/06/14 0545 10/11/14 0802  BNP 574.2* 223.3*    ProBNP (last 3 results) No results for input(s): PROBNP in the last 8760 hours.  CBG: No results for input(s): GLUCAP in the last 168 hours.  Time coordinating discharge: 35 minutes  Signed:  Elizabeth Haff  [facility] 10/26/2014, 11:45 AM

## 2014-10-26 NOTE — Progress Notes (Signed)
  Subjective: No n/v. +bm. Tolerated clears. No abd pain. Pink stuff in IR drain tube  Objective: Vital signs in last 24 hours: Temp:  [98.3 F (36.8 C)-98.9 F (37.2 C)] 98.7 F (37.1 C) (07/04 0529) Pulse Rate:  [90-96] 96 (07/04 0529) Resp:  [18-20] 20 (07/04 0529) BP: (108-126)/(68-75) 114/69 mmHg (07/04 0529) SpO2:  [95 %-97 %] 95 % (07/04 0529) Last BM Date: 10/25/14  Intake/Output from previous day: 07/03 0701 - 07/04 0700 In: 2020 [P.O.:1320; IV Piggyback:700] Out: 1075 [Urine:1075] Intake/Output this shift: Total I/O In: 500 [P.O.:500] Out: 150 [Urine:150]  Alert, nad, nontoxic Soft, nt, nd Drain - pink tinged debris in drain tube  Lab Results:   Recent Labs  10/24/14 0431 10/25/14 0544  WBC 11.2* 6.2  HGB 8.6* 7.8*  HCT 27.5* 24.7*  PLT 575* 432*   BMET  Recent Labs  10/24/14 0431 10/25/14 0544  NA 131* 131*  K 3.3* 3.6  CL 96* 98*  CO2 24 24  GLUCOSE 104* 84  BUN 9 6  CREATININE 0.49* 0.60*  CALCIUM 8.3* 7.9*   PT/INR No results for input(s): LABPROT, INR in the last 72 hours. ABG No results for input(s): PHART, HCO3 in the last 72 hours.  Invalid input(s): PCO2, PO2  Studies/Results: Dg Abd 1 View  10/26/2014   CLINICAL DATA:  Small bowel obstruction.  EXAM: ABDOMEN - 1 VIEW  COMPARISON:  10/25/2014  FINDINGS: Stable drainage catheter in the right abdomen. Stable surgical changes in the right abdomen. Scattered air, contrast in stool in the colon. No findings for small bowel obstruction or free air.  IMPRESSION: Stable abdominal radiograph. No findings for small bowel obstruction or free air.   Electronically Signed   By: Marijo Sanes M.D.   On: 10/26/2014 09:11   Dg Abd Portable 1v  10/25/2014   CLINICAL DATA:  Follow-up small bowel obstruction  EXAM: PORTABLE ABDOMEN - 1 VIEW  COMPARISON:  10/08/2014  FINDINGS: Nonobstructive bowel gas pattern. Drainage catheter in the right abdomen, stable. No supine evidence of free air. No  organomegaly or suspicious calcification. No acute bony abnormality.  IMPRESSION: Nonobstructive bowel gas pattern.   Electronically Signed   By: Rolm Baptise M.D.   On: 10/25/2014 07:51    Anti-infectives: Anti-infectives    Start     Dose/Rate Route Frequency Ordered Stop   10/24/14 1100  metroNIDAZOLE (FLAGYL) IVPB 500 mg     500 mg 100 mL/hr over 60 Minutes Intravenous Every 8 hours 10/24/14 0930     10/24/14 1000  ciprofloxacin (CIPRO) IVPB 400 mg     400 mg 200 mL/hr over 60 Minutes Intravenous Every 12 hours 10/24/14 0930        Assessment/Plan: Principal Problem:  Small bowel obstruction Active Problems:  Adenocarcinoma carcinomatosis  Postoperative intra-abdominal abscess  Intractable pain  psbo resolving If tolerates fulls can go home Needs to stay on fulls/protein shakes until later part of week Pt has appt with Dr Excell Seltzer on Friday  Leighton Ruff. Redmond Pulling, MD, FACS General, Bariatric, & Minimally Invasive Surgery [facility], [place]   LOS: 2 days    Gayland Curry 10/26/2014

## 2014-10-27 ENCOUNTER — Other Ambulatory Visit: Payer: Self-pay | Admitting: Oncology

## 2014-10-27 ENCOUNTER — Other Ambulatory Visit (HOSPITAL_BASED_OUTPATIENT_CLINIC_OR_DEPARTMENT_OTHER): Payer: PPO

## 2014-10-27 ENCOUNTER — Ambulatory Visit (HOSPITAL_BASED_OUTPATIENT_CLINIC_OR_DEPARTMENT_OTHER): Payer: PPO | Admitting: Oncology

## 2014-10-27 ENCOUNTER — Ambulatory Visit (HOSPITAL_BASED_OUTPATIENT_CLINIC_OR_DEPARTMENT_OTHER): Payer: PPO

## 2014-10-27 VITALS — BP 114/72 | HR 72 | Temp 97.8°F | Resp 18 | Ht 72.0 in | Wt 157.9 lb

## 2014-10-27 DIAGNOSIS — Z95828 Presence of other vascular implants and grafts: Secondary | ICD-10-CM

## 2014-10-27 DIAGNOSIS — C786 Secondary malignant neoplasm of retroperitoneum and peritoneum: Secondary | ICD-10-CM

## 2014-10-27 DIAGNOSIS — C801 Malignant (primary) neoplasm, unspecified: Secondary | ICD-10-CM

## 2014-10-27 DIAGNOSIS — D6481 Anemia due to antineoplastic chemotherapy: Secondary | ICD-10-CM

## 2014-10-27 DIAGNOSIS — J90 Pleural effusion, not elsewhere classified: Secondary | ICD-10-CM | POA: Diagnosis not present

## 2014-10-27 LAB — CBC WITH DIFFERENTIAL/PLATELET
BASO%: 0.9 % (ref 0.0–2.0)
BASOS ABS: 0.1 10*3/uL (ref 0.0–0.1)
EOS ABS: 0.1 10*3/uL (ref 0.0–0.5)
EOS%: 0.9 % (ref 0.0–7.0)
HCT: 26.4 % — ABNORMAL LOW (ref 38.4–49.9)
HEMOGLOBIN: 8.7 g/dL — AB (ref 13.0–17.1)
LYMPH%: 14.5 % (ref 14.0–49.0)
MCH: 28.4 pg (ref 27.2–33.4)
MCHC: 32.8 g/dL (ref 32.0–36.0)
MCV: 86.7 fL (ref 79.3–98.0)
MONO#: 0.7 10*3/uL (ref 0.1–0.9)
MONO%: 11.8 % (ref 0.0–14.0)
NEUT%: 71.9 % (ref 39.0–75.0)
NEUTROS ABS: 4.3 10*3/uL (ref 1.5–6.5)
Platelets: 479 10*3/uL — ABNORMAL HIGH (ref 140–400)
RBC: 3.05 10*6/uL — ABNORMAL LOW (ref 4.20–5.82)
RDW: 25.3 % — AB (ref 11.0–14.6)
WBC: 6 10*3/uL (ref 4.0–10.3)
lymph#: 0.9 10*3/uL (ref 0.9–3.3)

## 2014-10-27 LAB — COMPREHENSIVE METABOLIC PANEL (CC13)
ALT: 27 U/L (ref 0–55)
ANION GAP: 8 meq/L (ref 3–11)
AST: 77 U/L — ABNORMAL HIGH (ref 5–34)
Albumin: 1.8 g/dL — ABNORMAL LOW (ref 3.5–5.0)
BILIRUBIN TOTAL: 2.42 mg/dL — AB (ref 0.20–1.20)
BUN: 3.4 mg/dL — AB (ref 7.0–26.0)
CO2: 23 mEq/L (ref 22–29)
CREATININE: 0.6 mg/dL — AB (ref 0.7–1.3)
Calcium: 8.4 mg/dL (ref 8.4–10.4)
Chloride: 97 mEq/L — ABNORMAL LOW (ref 98–109)
EGFR: >90 mL/min/{1.73_m2} (ref >90)
Glucose: 98 mg/dl (ref 70–140)
Potassium: 4 mEq/L (ref 3.5–5.1)
Sodium: 128 mEq/L — ABNORMAL LOW (ref 136–145)
Total Protein: 6.7 g/dL (ref 6.4–8.3)

## 2014-10-27 MED ORDER — SODIUM CHLORIDE 0.9 % IJ SOLN
10.0000 mL | INTRAMUSCULAR | Status: DC | PRN
  Administered 2014-10-27: 10 mL via INTRAVENOUS
  Filled 2014-10-27: qty 10

## 2014-10-27 MED ORDER — HEPARIN SOD (PORK) LOCK FLUSH 100 UNIT/ML IV SOLN
500.0000 [IU] | Freq: Once | INTRAVENOUS | Status: AC
  Administered 2014-10-27: 500 [IU] via INTRAVENOUS
  Filled 2014-10-27: qty 5

## 2014-10-27 NOTE — Patient Instructions (Signed)

## 2014-10-27 NOTE — Progress Notes (Deleted)
Per Dr. [name], okay to tx with 10/12/14 labs; pt only needs labs every other treatment.

## 2014-10-27 NOTE — Progress Notes (Signed)
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 He developed acute abdominal pain and nausea 10/24/2014 and was admitted the hospital. His symptoms improved with bowel rest. A CT 10/24/2014 revealed marketed dilatation of the proximal and mid small bowel with a transition point in the right lower quadrant. A follow-up x-ray 10/26/2014 revealed no evidence of a bowel obstruction.  The CT on 10/24/2014 revealed moderate bile duct dilatation. A CT on 10/20/2014 revealed enlargement of the common bile duct to 1.2 cm with associated central intrahepatic biliary duct dilatation  He reports eating yesterday and today without difficulty.  He is scheduled to see Dr. Excell Seltzer later this week and will have a radiology appointment to evaluate the drainage catheter next week.  Objective:  Vital signs in last 24 hours:  Blood pressure 114/72, pulse 72, temperature 97.8 F (36.6 C), temperature source Oral, resp. rate 18, height 6' (1.829 m), weight 157 lb 14.4 oz (71.623 kg), SpO2 100 %.    HEENT: Mild scleral and subungual icterus Resp: Decreased breath sounds in the bases, no respiratory distress Cardio: Regular rate and rhythm GI: The abdomen is mildly distended, healed midline incision, right lower quadrant drain Vascular: Trace ankle edema bilaterally  Portacath/PICC-without erythema  Lab Results:  Lab Results  Component Value Date   WBC 6.0 10/27/2014   HGB 8.7* 10/27/2014   HCT 26.4* 10/27/2014   MCV 86.7 10/27/2014   PLT 479* 10/27/2014   NEUTROABS 4.3 10/27/2014   Creatinine 0.6, potassium 4.0, alk phosphatase 1235, AST 77, ALT 27, bilirubin 2.42  Lab Results  Component Value Date   CEA 3.0 07/01/2014    Imaging:  Dg Abd 1 View  10/26/2014   CLINICAL DATA:  Small bowel obstruction.  EXAM: ABDOMEN - 1 VIEW  COMPARISON:  10/25/2014  FINDINGS: Stable drainage catheter  in the right abdomen. Stable surgical changes in the right abdomen. Scattered air, contrast in stool in the colon. No findings for small bowel obstruction or free air.  IMPRESSION: Stable abdominal radiograph. No findings for small bowel obstruction or free air.   Electronically Signed   By: Marijo Sanes M.D.   On: 10/26/2014 09:11   Ct Abdomen Pelvis W Contrast  10/24/2014   CLINICAL DATA:  Abdominal pain. Recent percutaneous drainage of a right abdominal abscess. History of peritoneal carcinomatosis with prior right hemicolectomy.  EXAM: CT ABDOMEN AND PELVIS WITH CONTRAST  TECHNIQUE: Multidetector CT imaging of the abdomen and pelvis was performed using the standard protocol following bolus administration of intravenous contrast.  CONTRAST:  63m OMNIPAQUE IOHEXOL 300 MG/ML SOLN, 1054mOMNIPAQUE IOHEXOL 300 MG/ML SOLN  COMPARISON:  10/20/2014, 10/05/2014, 09/08/2014  FINDINGS: There is marked dilatation of proximal and mid small bowel with abrupt transition to decompressed distal small bowel. The transition point is in the right lower quadrant. This has the appearance of a moderately high-grade small bowel obstruction. The colon is decompressed.  There is a moderate volume peritoneal ascites, increased. The right pigtail catheter abscess drain is unchanged in position. There is a minute peripherally enhancing collection around the catheter, measuring only 9 mm in depth by about 5 cm in longest dimension. This is unchanged or slightly smaller than on 10/20/2014. There is no change in the moderate bile duct dilatation. No focal liver lesions are evident. There is no change in the unremarkable appearances of the spleen, pancreas, adrenals and kidneys with the exception of a 2 cm  cyst at the posterior aspect of the left kidney. There is no free intraperitoneal air. There is a moderately large left effusion and a small right effusion. There is slight atelectatic lung base opacity adjacent to the pleural fluid  collections. This is unchanged.  IMPRESSION: 1. New moderately high-grade small bowel obstruction with transition point in the right lower quadrant. 2. Increased volume of peritoneal ascites 3. Unchanged position of the pigtail catheter with a tiny residual collection around the catheter measuring about 0.9 x 5 cm 4. Moderately large left pleural effusion and small right pleural effusion.   Electronically Signed   By: Andreas Newport M.D.   On: 10/24/2014 06:35   Dg Abd Portable 1v  10/25/2014   CLINICAL DATA:  Follow-up small bowel obstruction  EXAM: PORTABLE ABDOMEN - 1 VIEW  COMPARISON:  10/08/2014  FINDINGS: Nonobstructive bowel gas pattern. Drainage catheter in the right abdomen, stable. No supine evidence of free air. No organomegaly or suspicious calcification. No acute bony abnormality.  IMPRESSION: Nonobstructive bowel gas pattern.   Electronically Signed   By: Rolm Baptise M.D.   On: 10/25/2014 07:51    Medications: I have reviewed the patient's current medications.  Assessment/Plan: 1. 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  CT 06/30/2014 consistent with a partial small bowel obstruction and abdominal carcinomatosis  Omentum biopsy 07/01/2014 confirmed adenocarcinoma, nonspecific staining pattern  PET scan 07/07/2014 with multiple areas of hypermetabolic bowel wall thickening consistent with serosal implants from carcinomatosis, thickening and hypermetabolic activity at the terminal ileum felt to potentially represent a primary neoplasm, solitary right liver metastasis, hypermetabolic nodular lung lesions  Cycle 1 FOLFOX 07/15/2014  Cycle 2 FOLFOX 07/28/2014  Cycle 3 FOLFOX 08/11/2014  Cycle 4 FOLFOX 08/25/2014  2. Abdominal pain secondary to #1, improved 3. Iron deficiency, Hemoccult positive stool 4. Partial small bowel obstruction-recurrent bowel obstruction symptoms requiring hospital admission 08/18/2014 5.  Bronchiectasis 6. 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 Nausea following cycle 1 FOLFOX, Emend and Aloxi added with cycle 2 8. Neutropenia secondary to chemotherapy-Neulasta will be added with cycle 4 FOLFOX 9. Admission 09/06/2014 with a small bowel obstruction, status post resection of the terminal ileum/right colon and small bowel bypass 09/09/2014  Pathology confirmed diffuse involvement of the small bowel, large bowel, and appendix with adenocarcinoma-no primary tumor site found 10. Anemia secondary to chronic disease, chemotherapy, and recent surgery  2 units packed red blood cells 09/23/2014  11. Malnutrition 12. Admission 10/05/2014 with an abdominal abscess, status post percutaneous drainage 13. Frequent loose bowel movements following in the right colectomy and small bowel bypass, improved with Lomotil 14. Bilateral pleural effusions-likely secondary to anasarca 15. Malnutrition 16.  Admission 10/24/2014 with a small bowel obstruction-resolved with bowel rest 17.  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 His symptoms improved with bowel rest. His performance status continues to improve. The plan was to resume chemotherapy on 10/28/2014, but he now has evidence of biliary obstruction. The obstruction could be related to a primary tumor within the common duct or distal hepatic ducts versus extrinsic compression.  I discussed the case with Dr. Henrene Pastor. The plan is to proceed with an MRCP and then ERCP as indicated.  [name] will return for an office visit 11/03/2014. He will be scheduled for FOLFOX chemotherapy 11/04/2014 if the hyperbilirubinemia improves.  Betsy Coder, MD  10/27/2014  4:35 PM

## 2014-10-28 ENCOUNTER — Telehealth: Payer: Self-pay | Admitting: *Deleted

## 2014-10-28 ENCOUNTER — Other Ambulatory Visit: Payer: Self-pay | Admitting: *Deleted

## 2014-10-28 ENCOUNTER — Ambulatory Visit: Payer: 59

## 2014-10-28 MED ORDER — POTASSIUM CHLORIDE ER 10 MEQ PO TBCR: 20.0000 meq | EXTENDED_RELEASE_TABLET | Freq: Every day | ORAL | Status: DC

## 2014-10-28 MED ORDER — FUROSEMIDE 20 MG PO TABS
20.0000 mg | ORAL_TABLET | Freq: Every day | ORAL | Status: DC
Start: 2014-10-28 — End: 2014-11-07

## 2014-10-28 NOTE — Telephone Encounter (Signed)
Per staff message and POF I have scheduled appts. Advised scheduler of appts. JMW  

## 2014-10-29 ENCOUNTER — Ambulatory Visit: Payer: 59

## 2014-10-30 ENCOUNTER — Ambulatory Visit: Payer: 59

## 2014-11-01 ENCOUNTER — Inpatient Hospital Stay (HOSPITAL_COMMUNITY)
Admission: EM | Admit: 2014-11-01 | Discharge: 2014-11-07 | DRG: 826 | Disposition: A | Discharge status: Hospice | Payer: PPO | Attending: Internal Medicine | Admitting: Internal Medicine

## 2014-11-01 ENCOUNTER — Inpatient Hospital Stay (HOSPITAL_COMMUNITY): Payer: PPO

## 2014-11-01 ENCOUNTER — Emergency Department (HOSPITAL_COMMUNITY): Payer: PPO

## 2014-11-01 ENCOUNTER — Encounter (HOSPITAL_COMMUNITY): Payer: Self-pay

## 2014-11-01 DIAGNOSIS — D638 Anemia in other chronic diseases classified elsewhere: Secondary | ICD-10-CM | POA: Insufficient documentation

## 2014-11-01 DIAGNOSIS — R1084 Generalized abdominal pain: Secondary | ICD-10-CM

## 2014-11-01 DIAGNOSIS — H3530 Unspecified macular degeneration: Secondary | ICD-10-CM | POA: Diagnosis present

## 2014-11-01 DIAGNOSIS — C7800 Secondary malignant neoplasm of unspecified lung: Secondary | ICD-10-CM | POA: Diagnosis present

## 2014-11-01 DIAGNOSIS — K8021 Calculus of gallbladder without cholecystitis with obstruction: Secondary | ICD-10-CM | POA: Diagnosis present

## 2014-11-01 DIAGNOSIS — Z9221 Personal history of antineoplastic chemotherapy: Secondary | ICD-10-CM | POA: Diagnosis not present

## 2014-11-01 DIAGNOSIS — E876 Hypokalemia: Secondary | ICD-10-CM | POA: Diagnosis not present

## 2014-11-01 DIAGNOSIS — G250 Essential tremor: Secondary | ICD-10-CM | POA: Diagnosis present

## 2014-11-01 DIAGNOSIS — E785 Hyperlipidemia, unspecified: Secondary | ICD-10-CM | POA: Diagnosis present

## 2014-11-01 DIAGNOSIS — E46 Unspecified protein-calorie malnutrition: Secondary | ICD-10-CM | POA: Diagnosis not present

## 2014-11-01 DIAGNOSIS — R109 Unspecified abdominal pain: Secondary | ICD-10-CM | POA: Diagnosis not present

## 2014-11-01 DIAGNOSIS — Z79899 Other long term (current) drug therapy: Secondary | ICD-10-CM

## 2014-11-01 DIAGNOSIS — Z825 Family history of asthma and other chronic lower respiratory diseases: Secondary | ICD-10-CM

## 2014-11-01 DIAGNOSIS — K831 Obstruction of bile duct: Secondary | ICD-10-CM | POA: Diagnosis present

## 2014-11-01 DIAGNOSIS — K219 Gastro-esophageal reflux disease without esophagitis: Secondary | ICD-10-CM | POA: Diagnosis present

## 2014-11-01 DIAGNOSIS — Z820 Family history of epilepsy and other diseases of the nervous system: Secondary | ICD-10-CM | POA: Diagnosis not present

## 2014-11-01 DIAGNOSIS — C800 Disseminated malignant neoplasm, unspecified: Secondary | ICD-10-CM | POA: Diagnosis present

## 2014-11-01 DIAGNOSIS — J479 Bronchiectasis, uncomplicated: Secondary | ICD-10-CM | POA: Diagnosis present

## 2014-11-01 DIAGNOSIS — T451X5A Adverse effect of antineoplastic and immunosuppressive drugs, initial encounter: Secondary | ICD-10-CM | POA: Diagnosis present

## 2014-11-01 DIAGNOSIS — Z8249 Family history of ischemic heart disease and other diseases of the circulatory system: Secondary | ICD-10-CM

## 2014-11-01 DIAGNOSIS — K5669 Other intestinal obstruction: Secondary | ICD-10-CM | POA: Diagnosis not present

## 2014-11-01 DIAGNOSIS — E43 Unspecified severe protein-calorie malnutrition: Secondary | ICD-10-CM | POA: Diagnosis present

## 2014-11-01 DIAGNOSIS — C787 Secondary malignant neoplasm of liver and intrahepatic bile duct: Secondary | ICD-10-CM | POA: Diagnosis present

## 2014-11-01 DIAGNOSIS — I81 Portal vein thrombosis: Secondary | ICD-10-CM | POA: Diagnosis present

## 2014-11-01 DIAGNOSIS — J90 Pleural effusion, not elsewhere classified: Secondary | ICD-10-CM | POA: Diagnosis present

## 2014-11-01 DIAGNOSIS — D701 Agranulocytosis secondary to cancer chemotherapy: Secondary | ICD-10-CM | POA: Diagnosis present

## 2014-11-01 DIAGNOSIS — C801 Malignant (primary) neoplasm, unspecified: Secondary | ICD-10-CM

## 2014-11-01 DIAGNOSIS — R933 Abnormal findings on diagnostic imaging of other parts of digestive tract: Secondary | ICD-10-CM | POA: Diagnosis not present

## 2014-11-01 DIAGNOSIS — C786 Secondary malignant neoplasm of retroperitoneum and peritoneum: Secondary | ICD-10-CM | POA: Insufficient documentation

## 2014-11-01 DIAGNOSIS — Z9049 Acquired absence of other specified parts of digestive tract: Secondary | ICD-10-CM | POA: Diagnosis present

## 2014-11-01 DIAGNOSIS — I10 Essential (primary) hypertension: Secondary | ICD-10-CM | POA: Diagnosis present

## 2014-11-01 DIAGNOSIS — IMO0002 Reserved for concepts with insufficient information to code with codable children: Secondary | ICD-10-CM

## 2014-11-01 DIAGNOSIS — K56609 Unspecified intestinal obstruction, unspecified as to partial versus complete obstruction: Secondary | ICD-10-CM | POA: Diagnosis present

## 2014-11-01 DIAGNOSIS — E871 Hypo-osmolality and hyponatremia: Secondary | ICD-10-CM | POA: Diagnosis present

## 2014-11-01 DIAGNOSIS — Z933 Colostomy status: Secondary | ICD-10-CM | POA: Diagnosis not present

## 2014-11-01 DIAGNOSIS — R7989 Other specified abnormal findings of blood chemistry: Secondary | ICD-10-CM | POA: Diagnosis not present

## 2014-11-01 DIAGNOSIS — Z833 Family history of diabetes mellitus: Secondary | ICD-10-CM | POA: Diagnosis not present

## 2014-11-01 DIAGNOSIS — R112 Nausea with vomiting, unspecified: Secondary | ICD-10-CM | POA: Diagnosis present

## 2014-11-01 DIAGNOSIS — R180 Malignant ascites: Secondary | ICD-10-CM | POA: Insufficient documentation

## 2014-11-01 DIAGNOSIS — K5660 Unspecified intestinal obstruction: Secondary | ICD-10-CM | POA: Diagnosis present

## 2014-11-01 DIAGNOSIS — Z6820 Body mass index (BMI) 20.0-20.9, adult: Secondary | ICD-10-CM

## 2014-11-01 DIAGNOSIS — Z79891 Long term (current) use of opiate analgesic: Secondary | ICD-10-CM

## 2014-11-01 DIAGNOSIS — D509 Iron deficiency anemia, unspecified: Secondary | ICD-10-CM | POA: Diagnosis not present

## 2014-11-01 LAB — COMPREHENSIVE METABOLIC PANEL
ALBUMIN: 2.1 g/dL — AB (ref 3.5–5.0)
ALT: 32 U/L (ref 17–63)
ALT: 31 U/L (ref 17–63)
AST: 79 U/L — AB (ref 15–41)
AST: 73 U/L — ABNORMAL HIGH (ref 15–41)
Albumin: 2.1 g/dL — ABNORMAL LOW (ref 3.5–5.0)
Alkaline Phosphatase: 1446 U/L — ABNORMAL HIGH (ref 38–126)
Alkaline Phosphatase: 1298 U/L — ABNORMAL HIGH (ref 38–126)
Anion gap: 11 (ref 5–15)
Anion gap: 9 (ref 5–15)
BILIRUBIN TOTAL: 5.3 mg/dL — AB (ref 0.3–1.2)
BUN: 9 mg/dL (ref 6–20)
BUN: 9 mg/dL (ref 6–20)
CALCIUM: 8.3 mg/dL — AB (ref 8.9–10.3)
CO2: 23 mmol/L (ref 22–32)
CO2: 24 mmol/L (ref 22–32)
Calcium: 8.3 mg/dL — ABNORMAL LOW (ref 8.9–10.3)
Chloride: 95 mmol/L — ABNORMAL LOW (ref 101–111)
Chloride: 97 mmol/L — ABNORMAL LOW (ref 101–111)
Creatinine, Ser: 0.47 mg/dL — ABNORMAL LOW (ref 0.61–1.24)
Creatinine, Ser: 0.59 mg/dL — ABNORMAL LOW (ref 0.61–1.24)
GFR calc Af Amer: >60 mL/min (ref >60)
GFR calc Af Amer: >60 mL/min (ref >60)
GFR calc non Af Amer: >60 mL/min (ref >60)
GLUCOSE: 108 mg/dL — AB (ref 65–99)
GLUCOSE: 100 mg/dL — AB (ref 65–99)
POTASSIUM: 3.7 mmol/L (ref 3.5–5.1)
Potassium: 3.6 mmol/L (ref 3.5–5.1)
Sodium: 129 mmol/L — ABNORMAL LOW (ref 135–145)
Sodium: 130 mmol/L — ABNORMAL LOW (ref 135–145)
Total Bilirubin: 5 mg/dL — ABNORMAL HIGH (ref 0.3–1.2)
Total Protein: 7.2 g/dL (ref 6.5–8.1)
Total Protein: 7 g/dL (ref 6.5–8.1)

## 2014-11-01 LAB — MAGNESIUM: MAGNESIUM: 1.4 mg/dL — AB (ref 1.7–2.4)

## 2014-11-01 LAB — CBC WITH DIFFERENTIAL/PLATELET
BASOS ABS: 0.1 10*3/uL (ref 0.0–0.1)
BASOS PCT: 1 % (ref 0–1)
Basophils Absolute: 0.1 10*3/uL (ref 0.0–0.1)
Basophils Relative: 1 % (ref 0–1)
EOS PCT: 1 % (ref 0–5)
Eosinophils Absolute: 0.1 10*3/uL (ref 0.0–0.7)
Eosinophils Absolute: 0.1 10*3/uL (ref 0.0–0.7)
Eosinophils Relative: 1 % (ref 0–5)
HCT: 30.4 % — ABNORMAL LOW (ref 39.0–52.0)
HCT: 29.3 % — ABNORMAL LOW (ref 39.0–52.0)
HEMOGLOBIN: 9.8 g/dL — AB (ref 13.0–17.0)
Hemoglobin: 9.5 g/dL — ABNORMAL LOW (ref 13.0–17.0)
Lymphocytes Relative: 17 % (ref 12–46)
Lymphocytes Relative: 20 % (ref 12–46)
Lymphs Abs: 1.4 10*3/uL (ref 0.7–4.0)
Lymphs Abs: 1.6 10*3/uL (ref 0.7–4.0)
MCH: 28.6 pg (ref 26.0–34.0)
MCH: 28.9 pg (ref 26.0–34.0)
MCHC: 32.2 g/dL (ref 30.0–36.0)
MCHC: 32.4 g/dL (ref 30.0–36.0)
MCV: 88.6 fL (ref 78.0–100.0)
MCV: 89.1 fL (ref 78.0–100.0)
MONO ABS: 0.8 10*3/uL (ref 0.1–1.0)
MONO ABS: 1 10*3/uL (ref 0.1–1.0)
Monocytes Relative: 10 % (ref 3–12)
Monocytes Relative: 12 % (ref 3–12)
Neutro Abs: 5.8 10*3/uL (ref 1.7–7.7)
Neutro Abs: 5.4 10*3/uL (ref 1.7–7.7)
Neutrophils Relative %: 71 % (ref 43–77)
Neutrophils Relative %: 66 % (ref 43–77)
PLATELETS: 565 10*3/uL — AB (ref 150–400)
PLATELETS: 521 10*3/uL — AB (ref 150–400)
RBC: 3.43 MIL/uL — AB (ref 4.22–5.81)
RBC: 3.29 MIL/uL — AB (ref 4.22–5.81)
RDW: 24.7 % — ABNORMAL HIGH (ref 11.5–15.5)
RDW: 24.6 % — ABNORMAL HIGH (ref 11.5–15.5)
WBC: 8.2 10*3/uL (ref 4.0–10.5)
WBC: 8.2 10*3/uL (ref 4.0–10.5)

## 2014-11-01 LAB — APTT: aPTT: 36 seconds (ref 24–37)

## 2014-11-01 LAB — TSH: TSH: 7.198 u[IU]/mL — ABNORMAL HIGH (ref 0.350–4.500)

## 2014-11-01 LAB — PROTIME-INR
INR: 1.24 (ref 0.00–1.49)
Prothrombin Time: 15.8 seconds — ABNORMAL HIGH (ref 11.6–15.2)

## 2014-11-01 LAB — PHOSPHORUS: PHOSPHORUS: 3.5 mg/dL (ref 2.5–4.6)

## 2014-11-01 MED ORDER — METRONIDAZOLE IN NACL 5-0.79 MG/ML-% IV SOLN
500.0000 mg | Freq: Three times a day (TID) | INTRAVENOUS | Status: DC
  Administered 2014-11-01 – 2014-11-04 (×8): 500 mg via INTRAVENOUS
  Filled 2014-11-01 (×8): qty 100

## 2014-11-01 MED ORDER — ONDANSETRON HCL 4 MG/2ML IJ SOLN
4.0000 mg | Freq: Once | INTRAMUSCULAR | Status: AC
  Administered 2014-11-01: 4 mg via INTRAVENOUS
  Filled 2014-11-01: qty 2

## 2014-11-01 MED ORDER — GADOBENATE DIMEGLUMINE 529 MG/ML IV SOLN
15.0000 mL | Freq: Once | INTRAVENOUS | Status: AC | PRN
  Administered 2014-11-01: 14 mL via INTRAVENOUS

## 2014-11-01 MED ORDER — ONDANSETRON HCL 4 MG PO TABS: 4.0000 mg | ORAL_TABLET | Freq: Four times a day (QID) | ORAL | Status: DC | PRN

## 2014-11-01 MED ORDER — CIPROFLOXACIN IN D5W 400 MG/200ML IV SOLN
400.0000 mg | Freq: Two times a day (BID) | INTRAVENOUS | Status: DC
  Administered 2014-11-01 – 2014-11-04 (×6): 400 mg via INTRAVENOUS
  Filled 2014-11-01 (×6): qty 200

## 2014-11-01 MED ORDER — PRIMIDONE 250 MG PO TABS
250.0000 mg | ORAL_TABLET | Freq: Every morning | ORAL | Status: DC
  Administered 2014-11-02 – 2014-11-05 (×4): 250 mg via ORAL
  Filled 2014-11-01 (×8): qty 1

## 2014-11-01 MED ORDER — PROPRANOLOL HCL 10 MG PO TABS
10.0000 mg | ORAL_TABLET | Freq: Two times a day (BID) | ORAL | Status: DC
Start: 2014-11-01 — End: 2014-11-07
  Administered 2014-11-01 – 2014-11-05 (×8): 10 mg via ORAL
  Filled 2014-11-01 (×9): qty 1

## 2014-11-01 MED ORDER — SACCHAROMYCES BOULARDII 250 MG PO CAPS
250.0000 mg | ORAL_CAPSULE | Freq: Two times a day (BID) | ORAL | Status: DC
  Administered 2014-11-01 – 2014-11-05 (×8): 250 mg via ORAL
  Filled 2014-11-01 (×9): qty 1

## 2014-11-01 MED ORDER — SODIUM CHLORIDE 0.9 % IJ SOLN
10.0000 mL | INTRAMUSCULAR | Status: DC | PRN
  Administered 2014-11-02 – 2014-11-04 (×2): 10 mL
  Filled 2014-11-01 (×2): qty 40

## 2014-11-01 MED ORDER — ONDANSETRON HCL 4 MG/2ML IJ SOLN
4.0000 mg | Freq: Four times a day (QID) | INTRAMUSCULAR | Status: DC | PRN
  Administered 2014-11-01 – 2014-11-07 (×9): 4 mg via INTRAVENOUS
  Filled 2014-11-01 (×9): qty 2

## 2014-11-01 MED ORDER — SODIUM CHLORIDE 0.9 % IV SOLN
Freq: Once | INTRAVENOUS | Status: AC
  Administered 2014-11-01: 09:00:00 via INTRAVENOUS

## 2014-11-01 MED ORDER — HYDROMORPHONE HCL 1 MG/ML IJ SOLN
0.5000 mg | INTRAMUSCULAR | Status: DC | PRN
Start: 2014-11-01 — End: 2014-11-01

## 2014-11-01 MED ORDER — ACETAMINOPHEN 325 MG PO TABS: 650.0000 mg | ORAL_TABLET | Freq: Four times a day (QID) | ORAL | Status: DC | PRN

## 2014-11-01 MED ORDER — SENNA 8.6 MG PO TABS
1.0000 | ORAL_TABLET | Freq: Every day | ORAL | Status: DC
  Administered 2014-11-02 – 2014-11-05 (×4): 8.6 mg via ORAL
  Filled 2014-11-01 (×4): qty 1

## 2014-11-01 MED ORDER — HYDROMORPHONE HCL 1 MG/ML IJ SOLN
1.0000 mg | Freq: Once | INTRAMUSCULAR | Status: AC
  Administered 2014-11-01: 1 mg via INTRAVENOUS
  Filled 2014-11-01: qty 1

## 2014-11-01 MED ORDER — HYDROMORPHONE HCL 1 MG/ML IJ SOLN
1.0000 mg | INTRAMUSCULAR | Status: DC | PRN
  Administered 2014-11-01 – 2014-11-07 (×9): 1 mg via INTRAVENOUS
  Filled 2014-11-01 (×9): qty 1

## 2014-11-01 MED ORDER — SUCRALFATE 1 G PO TABS
1.0000 g | ORAL_TABLET | Freq: Three times a day (TID) | ORAL | Status: DC
  Administered 2014-11-01 – 2014-11-07 (×14): 1 g via ORAL
  Filled 2014-11-01 (×28): qty 1

## 2014-11-01 MED ORDER — SODIUM CHLORIDE 0.9 % IV SOLN
INTRAVENOUS | Status: AC
  Administered 2014-11-01 – 2014-11-05 (×5): via INTRAVENOUS

## 2014-11-01 MED ORDER — ACETAMINOPHEN 650 MG RE SUPP: 650.0000 mg | Freq: Four times a day (QID) | RECTAL | Status: DC | PRN

## 2014-11-01 MED ORDER — ONDANSETRON HCL 4 MG/2ML IJ SOLN: 4.0000 mg | Freq: Three times a day (TID) | INTRAMUSCULAR | Status: DC | PRN

## 2014-11-01 MED ORDER — PANTOPRAZOLE SODIUM 40 MG IV SOLR
40.0000 mg | Freq: Two times a day (BID) | INTRAVENOUS | Status: DC
  Administered 2014-11-01 – 2014-11-06 (×11): 40 mg via INTRAVENOUS
  Filled 2014-11-01 (×11): qty 40

## 2014-11-01 NOTE — Progress Notes (Signed)
Pt returned from MRI. Tolerated procedure well. Pt evaluated by Dr. Charlies Silvers. Pt in hall ambulating with spouse. No complaints at this time. No nausea or pain.

## 2014-11-01 NOTE — ED Notes (Signed)
[facility] unaware that pt was assigned to their floor. Paged Dr Charlies Silvers to double check tele order.

## 2014-11-01 NOTE — ED Notes (Signed)
Diane, 4th floor secretary notified that Dr. Charlies Silvers wanted a tele bed.

## 2014-11-01 NOTE — ED Notes (Signed)
Patient refused NG tube at this time. Patient stated he was not nauseated and he also wanted to speak with the surgeon before receiving the NG.

## 2014-11-01 NOTE — Progress Notes (Signed)
Received pt in room accompanied by his wife. Pt awake and alert, only complaints at this time is nausea. Pt being evaluated for recurring bowel obstruction with symptoms starting yesterday. Pt denies pain at this time. Pt abdomen is distended with faint bowel sounds. Last BM reported was yesterday, three times per wife. Pt would like to speak with surgery prior to having NGT placed. Pt currently NPO. IVF infusing.

## 2014-11-01 NOTE — ED Notes (Signed)
Patient transported to X-ray 

## 2014-11-01 NOTE — ED Notes (Addendum)
Pt with Hx of cancer c/o abdominal pain and nausea, pt denies recent chemo or radiation treatment. Pt had partial small bowel obstruction last week, which resolved itself. Pt states he feels the same way he did with small bowel obstruction. Pt had drainage back for abdominal abscess, draining about 1 mL clear fluid per day. MD at bedside.

## 2014-11-01 NOTE — Consult Note (Addendum)
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 He underwent partial colectomy and SBR for carcinomatosis and obstruction in May by Wesley Wesley Dillon.  This was complicated by an abscess and anastomotic leak.  This has almost resolved based on drain injection study.  HPI: Pt recently hospitalized for SBO that resolved with bowel rest and no NG.  He reports several days of tolerating a reg diet but then developed recurring nausea, vomiting and abd distention yesterday afternoon.  He presented to the ED today and plain films show dilated loops of small bowel.  WBC normal.  CMET shows albumin of 2.1 and bilirubin of 5 (up from 2.5 five days ago).  Past Medical History  Diagnosis Date  . Tremor     takes Primidone 250 once daily and inderall 160 daily for this-has had it since he was 21  . Pneumonia 06/2012  . Macular degeneration   . Benign essential tremor   . Coronary artery disease   . Anemia 06/30/2014  . 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 Essential hypertension 08/18/2014  . 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 Ascending aortic aneurysm 08/2014  . Sepsis 09/2014  . GERD (gastroesophageal reflux disease)   . Postoperative intra-abdominal abscess 09/2014  . SBO (small bowel obstruction)     recurrent  . Hyperlipidemia   . Hypoalbuminemia 09/2014    Past Surgical History  Procedure Laterality Date  . Tonsillectomy    . Video bronchoscopy Bilateral 04/29/2013    Procedure: VIDEO BRONCHOSCOPY WITHOUT FLUORO;  Surgeon: Wesley Gobble, MD;  Location: [facility];  Service: Cardiopulmonary;  Laterality: Bilateral;  . Laparotomy N/A 09/09/2014    Procedure: EXPLORATORY LAPAROTOMY resection terminal ileum and right colon;  Surgeon: Wesley Seltzer, MD;  Location: [facility];  Service: General;  Laterality: N/A;  . Bowel resection N/A 09/09/2014    Procedure: SMALL BOWEL bypass;  Surgeon: Wesley Seltzer, MD;  Location: [facility];   Service: General;  Laterality: N/A;  . Colostomy revision  09/09/2014    Procedure: COLON RESECTION RIGHT;  Surgeon: Wesley Seltzer, MD;  Location: [facility];  Service: General;;  . Abcess drainage  10/05/2014    intra-abd abscess, drain placed by IR    Family History  Problem Relation Age of Onset  . CAD Father 17    Died MI  . Atrial fibrillation Father   . Asthma Father   . Alzheimer's disease Mother   . Diabetes Mother   . Heart disease Mother   . Atrial fibrillation Mother   . Seizures Son     Died status epilepticus    Social History:  reports that he has never smoked. He has never used smokeless tobacco. He reports that he does not drink alcohol or use illicit drugs.  Allergies: No Known Allergies  Medications: I have reviewed the patient's current medications.  Results for orders placed or performed during the hospital encounter of 11/01/14 (from the past 48 hour(s))  CBC with Differential/Platelet     Status: Abnormal   Collection Time: 11/01/14  8:47 AM  Result Value Ref Range   WBC 8.2 4.0 - 10.5 K/uL   RBC 3.43 (L) 4.22 - 5.81 MIL/uL   Hemoglobin 9.8 (L) 13.0 - 17.0 g/dL   HCT 30.4 (L) 39.0 - 52.0 %   MCV 88.6 78.0 - 100.0 fL   MCH 28.6 26.0 - 34.0 pg   MCHC 32.2 30.0 - 36.0 g/dL   RDW 24.7 (H) 11.5 -  15.5 %   Platelets 565 (H) 150 - 400 K/uL   Neutrophils Relative % 71 43 - 77 %   Lymphocytes Relative 17 12 - 46 %   Monocytes Relative 10 3 - 12 %   Eosinophils Relative 1 0 - 5 %   Basophils Relative 1 0 - 1 %   Neutro Abs 5.8 1.7 - 7.7 K/uL   Lymphs Abs 1.4 0.7 - 4.0 K/uL   Monocytes Absolute 0.8 0.1 - 1.0 K/uL   Eosinophils Absolute 0.1 0.0 - 0.7 K/uL   Basophils Absolute 0.1 0.0 - 0.1 K/uL   RBC Morphology POLYCHROMASIA PRESENT     Comment: [facility] CELLS  Comprehensive metabolic panel     Status: Abnormal   Collection Time: 11/01/14  8:47 AM  Result Value Ref Range   Sodium 129 (L) 135 - 145 mmol/L   Potassium 3.6 3.5 - 5.1 mmol/L   Chloride 95 (L)  101 - 111 mmol/L   CO2 23 22 - 32 mmol/L   Glucose, Bld 108 (H) 65 - 99 mg/dL   BUN 9 6 - 20 mg/dL   Creatinine, Ser 0.47 (L) 0.61 - 1.24 mg/dL   Calcium 8.3 (L) 8.9 - 10.3 mg/dL   Total Protein 7.2 6.5 - 8.1 g/dL   Albumin 2.1 (L) 3.5 - 5.0 g/dL   AST 79 (H) 15 - 41 U/L   ALT 32 17 - 63 U/L   Alkaline Phosphatase 1446 (H) 38 - 126 U/L   Total Bilirubin 5.0 (H) 0.3 - 1.2 mg/dL   GFR calc non Af Amer >60 >60 mL/min   GFR calc Af Amer >60 >60 mL/min    Comment: (NOTE) The eGFR has been calculated using the CKD EPI equation. This calculation has not been validated in all clinical situations. eGFR's persistently <60 mL/min signify possible Chronic Kidney Disease.    Anion gap 11 5 - 15    Dg Abd Acute W/chest  11/01/2014   CLINICAL DATA:  Abdominal pain and distention today. History of recent bowel obstruction an abscess drainage.  EXAM: DG ABDOMEN ACUTE W/ 1V CHEST  COMPARISON:  10/26/2014 radiograph and CT scan 10/24/2014  FINDINGS: The upright chest x-ray demonstrates a stable are poor. There are small bilateral pleural effusions and overlying atelectasis but no infiltrates or edema. The heart is normal in size. The mediastinal and hilar contours are stable.  Two views of the abdomen demonstrate a stable drainage catheter in the region of the right pericolic gutter. Dilated small bowel loops with air-fluid levels suggesting recurrent small bowel obstruction. Persistent ascites.  IMPRESSION: 1. Small bilateral pleural effusions and bibasilar atelectasis. 2. Suspect recurrent small bowel obstruction and ascites.   Electronically Signed   By: Marijo Sanes M.D.   On: 11/01/2014 10:45    Review of Systems  Constitutional: Positive for weight loss and malaise/fatigue. Negative for fever and chills.  Eyes: Negative for blurred vision.  Respiratory: Negative for cough and shortness of breath.   Cardiovascular: Negative for chest pain.  Gastrointestinal: Positive for nausea and vomiting.  Negative for abdominal pain, diarrhea and constipation.  Genitourinary: Negative for dysuria, urgency and frequency.  Skin: Negative for itching and rash.  Neurological: Negative for dizziness and headaches.   Blood pressure 121/68, pulse 80, temperature 98.2 F (36.8 C), temperature source Oral, resp. rate 16, SpO2 100 %. Physical Exam  Constitutional: He is oriented to person, place, and time. No distress.  HENT:  Head: Normocephalic and atraumatic.  Eyes: EOM are  normal. Pupils are equal, round, and reactive to light. Scleral icterus is present.  Neck: Normal range of motion. Neck supple.  Cardiovascular: Normal rate and regular rhythm.   Respiratory: Effort normal and breath sounds normal. No respiratory distress.  GI: Soft. He exhibits distension. There is no tenderness. There is no rebound and no guarding.  Drain in RLQ with minimal clear fluid  Musculoskeletal: Normal range of motion.  Neurological: He is alert and oriented to person, place, and time.  Skin: Skin is warm and dry. He is not diaphoretic.    Assessment/Plan: Pt with SBO.  Resolved last week with bowel rest alone.  We will try this again and repeat plain films in AM to evaluate progression.  If films show worsening dilation, will probably benefit from NG.  If pt's nausea is not controlled with medications, recommend NG as well.   He may need to stay on a full liquid diet once this resolves.  Pt not an operative candidate as he is severely malnourished and has already failed after operative management only 1 month ago.  Pt also jaundiced.  Bilirubin is elevating quickly, although some of this may be due to dehydration.  He is scheduled for MRCP next week.  I have ordered this to be done here instead.  Pt may need GI involvement as well.    Wesley Dillon C. [id number]/3943, 2:00 PM

## 2014-11-01 NOTE — ED Notes (Addendum)
Spoke with Chong Sicilian, Agricultural consultant on 5W. Aware of 20 mins @ 1205

## 2014-11-01 NOTE — H&P (Signed)
[facility] History and Physical  Wesley Dillon VVO:[id number] DOB: [birth year] DOA: 11/01/2014  Referring physician: ER physician: Dr. 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 His medical history summary is as follows: has had PET scan 07/07/2014 with with multiple areas of hypermetabolic bowel wall thickening consistent with serosal implants from carcinomatosis, thickening and hypermetabolic activity at the terminal ileum felt to potentially represent a primary neoplasm, solitary right liver metastasis, hypermetabolic nodular lung lesions. He was started on chemotherapy under Dr. Gearldine Dillon care (FOLFOX given 07/15/14, 07/28/14, 08/11/14 and 08/25/14). He has had few admissions for small bowel obstruction (his first bowel obstruction was at the time of the diagnosis 06/30/2014). He was hospitalized in 07/2014 and then again in 08/2014 when he underwent resection of the terminal ileum/right colon and small bowel bypass 09/09/2014. He was hospitalized in 09/2014 for abdominal abscess and underwent percutaneous drainage at that time. His most recent admission was 10/24/2014 with SBO but this has resolved without surgical intervention and he was discharged home 10/26/2014. He was doing relatively ok but then developed nausea, vomiting, abdominal distention for last 2 days prior to this admission. He reports no fevers. No hematemesis. He does reports loose stools but this is since his right hemicolectomy.   In ED, vitals are stable. Blood work revealed hemoglobin of 9.8, sodium 129, normal renal function. His bilirubin is 5 (which is elevated compared with the bilirubin few days ago, 2.42). Surgery has seen him in consultation. MRI ordered and results are pending.   Assessment &  Plan     Principal Problem: Abdominal pain, nausea, vomiting / Recurrent small bowel obstruction  - In the setting of abdominal carcinomatosis, status post resection of the terminal ileum/right colon and small bowel bypass 09/09/2014 - Abdominal x ray on this admission shows recurrent SBO - Surgery consulted and we appreciate their input - Follow up MRI results - keep NPO - Continue supportive care with IV fluids, analgesia and antiemetics as needed  Active Problems: Adenocarcinoma carcinomatosis - Status post 4 cycles of FOLFOX under Dr. Gearldine Dillon care - Please inform Dr. Benay Dillon of pt adm in am. Pt was scheduled to see Dr. Benay Dillon 7/12 and planned chemo (FOLFOX) 7/13 if the hyperbilirubinemia improves. His bilirubin however is 5 compared with bilirubin 10/27/2014 when it was 2.4.  Postoperative intra-abdominal abscess - On recent admission - Change cipro and flagyl to IV regimen (he did not take any PO meds this am due to N/V)  Left pleural effusion and ascites - He has been controlling this with Lasix but will place on hold for now while he is NPO nad receiving IV fluids   Hyponatremia - Due to GI losses - Continue IV fluids   Hyperbilirubinemia / obstructive jaundice  - The obstruction could be related to a primary tumor within the common duct or distal hepatic ducts versus extrinsic compression. - Follow up MRI results   Severe protein calorie malnutrition - Due to chronic illness - NPO due to SBO  DVT prophylaxis:  - SCD's bilaterally   Radiological Exams on Admission: Dg Abd Acute W/chest 11/01/2014 1. Small bilateral pleural effusions and bibasilar atelectasis. 2. Suspect recurrent small bowel obstruction and ascites.     Code Status: Full Family Communication: Plan of care discussed with the patient  Disposition Plan: Admit for further evaluation; telemetry  Wesley Lenz, MD  [facility] Pager [phone]  Time spent in minutes: 75 minutes  Review of  Systems:  Constitutional: Negative for fever, chills and malaise/fatigue. Negative for diaphoresis.  HENT: Negative for hearing loss, ear pain, nosebleeds, congestion, sore throat, neck pain, tinnitus and ear discharge.   Eyes: Negative for blurred vision, double vision, photophobia, pain, discharge and redness.  Respiratory: Negative for cough, hemoptysis, sputum production, shortness of breath, wheezing and stridor.   Cardiovascular: Negative for chest pain, palpitations, orthopnea, claudication and leg swelling.  Gastrointestinal: per HPI Genitourinary: Negative for dysuria, urgency, frequency, hematuria and flank pain.  Musculoskeletal: Negative for myalgias, back pain, joint pain and falls.  Skin: Negative for itching and rash.  Neurological: Negative for dizziness and weakness. Negative for tingling, tremors, sensory change, speech change, focal weakness, loss of consciousness and headaches.  Endo/Heme/Allergies: Negative for environmental allergies and polydipsia. Does not bruise/bleed easily.  Psychiatric/Behavioral: Negative for suicidal ideas. The patient is not nervous/anxious.      Past Medical History  Diagnosis Date  . Tremor     takes Primidone 250 once daily and inderall 160 daily for this-has had it since he was 21  . Pneumonia 06/2012  . Macular degeneration   . Benign essential tremor   . Coronary artery disease   . Anemia 06/30/2014  . 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 Essential hypertension 08/18/2014  . 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 Ascending aortic aneurysm 08/2014  . Sepsis 09/2014  . GERD (gastroesophageal reflux disease)   . Postoperative intra-abdominal abscess 09/2014  . SBO (small bowel obstruction)     recurrent  . Hyperlipidemia   . Hypoalbuminemia 09/2014   Past Surgical History  Procedure Laterality Date  . Tonsillectomy    . Video bronchoscopy Bilateral 04/29/2013    Procedure: VIDEO BRONCHOSCOPY WITHOUT FLUORO;   Surgeon: Collene Gobble, MD;  Location: [facility];  Service: Cardiopulmonary;  Laterality: Bilateral;  . Laparotomy N/A 09/09/2014    Procedure: EXPLORATORY LAPAROTOMY resection terminal ileum and right colon;  Surgeon: Excell Seltzer, MD;  Location: [facility];  Service: General;  Laterality: N/A;  . Bowel resection N/A 09/09/2014    Procedure: SMALL BOWEL bypass;  Surgeon: Excell Seltzer, MD;  Location: [facility];  Service: General;  Laterality: N/A;  . Colostomy revision  09/09/2014    Procedure: COLON RESECTION RIGHT;  Surgeon: Excell Seltzer, MD;  Location: [facility];  Service: General;;  . Abcess drainage  10/05/2014    intra-abd abscess, drain placed by IR   Social History:  reports that he has never smoked. He has never used smokeless tobacco. He reports that he does not drink alcohol or use illicit drugs.  No Known Allergies  Family History:  Family History  Problem Relation Age of Onset  . CAD Father 13    Died MI  . Atrial fibrillation Father   . Asthma Father   . Alzheimer's disease Mother   . Diabetes Mother   . Heart disease Mother   . Atrial fibrillation Mother   . Seizures Son     Died status epilepticus     Prior to Admission medications   Medication Sig Start Date End Date Taking? Authorizing Provider  acetaminophen(TYLENOL) 500 MG tablet Take 1,000 mg by mouth every 6 (six) hours as needed for moderate pain or headache.   Yes Historical Provider, MD  ciprofloxacin (CIPRO) 500 MG tablet Take 1 tablet (500 mg total) by mouth 2 (two) times daily. 10/09/14  Yes Janece Canterbury,  MD  diphenoxylate-atropine (LOMOTIL) 2.5-0.025 MG per tablet Take 1 tablet by mouth 4 (four) times daily as needed for diarrhea or loose stools. 10/05/14  Yes Ladell Pier, MD  furosemide (LASIX) 20 MG tablet Take 1 tablet (20 mg total) by mouth daily. 10/28/14  Yes Ladell Pier, MD  lidocaine (LIDODERM) 5 % Place 1 patch onto the skin daily. Remove & Discard patch within 12 hours or as  directed by MD 10/12/14  Yes Janece Canterbury, MD  lidocaine-prilocaine (EMLA) cream Apply 1 application topically as needed. Apply to [facility] 1-2 hours prior to stick and cover with plastic wrap 07/08/14  Yes Ladell Pier, MD  megestrol (MEGACE) 40 MG/ML suspension Take 10 mLs (400 mg total) by mouth daily. Patient taking differently: Take 200 mg by mouth 2 (two) times daily.  10/09/14  Yes Janece Canterbury, MD  metroNIDAZOLE (FLAGYL) 500 MG tablet Take 1 tablet (500 mg total) by mouth 3 (three) times daily. Patient taking differently: Take 500 mg by mouth 2 (two) times daily.  10/09/14  Yes Janece Canterbury, MD  Multiple Vitamins-Minerals (PRESERVISION AREDS PO) Take 2 capsules by mouth every morning.    Yes Historical Provider, MD  ondansetron (ZOFRAN ODT) 4 MG disintegrating tablet Take 1 tablet (4 mg total) by mouth every 4 (four) hours as needed for nausea or vomiting. 08/11/14  Yes Ladell Pier, MD  oxyCODONE (OXY IR/ROXICODONE) 5 MG immediate release tablet Take 1-2 tablets (5-10 mg total) by mouth every 4 (four) hours as needed for severe pain. 07/28/14  Yes Ladell Pier, MD  pantoprazole (PROTONIX) 40 MG tablet Take 1 tablet (40 mg total) by mouth 2 (two) times daily. 10/09/14  Yes Janece Canterbury, MD  potassium chloride (K-DUR) 10 MEQ tablet Take 2 tablets (20 mEq total) by mouth daily. 10/28/14  Yes Ladell Pier, MD  primidone (MYSOLINE) 250 MG tablet Take 1 tablet (250 mg total) by mouth at bedtime. Patient taking differently: Take 250 mg by mouth every morning.  10/21/13  Yes Asencion Partridge Dohmeier, MD  prochlorperazine (COMPAZINE) 10 MG tablet Take 1 tablet (10 mg total) by mouth every 6 (six) hours as needed for nausea. 07/08/14  Yes Ladell Pier, MD  propranolol (INDERAL) 10 MG tablet TAKE 1 TABLET BY MOUTH 2 TIMES DAILY. Patient taking differently: TAKE 2 TABLET BY MOUTH ONCE  DAILY. 10/28/14  Yes Ladell Pier, MD  saccharomyces boulardii (FLORASTOR) 250 MG capsule Take 1 capsule (250 mg  total) by mouth 2 (two) times daily. 09/18/14  Yes Donne Hazel, MD  sucralfate (CARAFATE) 1 G tablet Take 1 tablet (1 g total) by mouth 4 (four) times daily -  with meals and at bedtime. Chew prior to swallowing 10/09/14  Yes Janece Canterbury, MD   Physical Exam: Filed Vitals:   11/01/14 0823 11/01/14 1056  BP: 133/85 121/74  Pulse: 88 79  Temp: 97.5 F (36.4 C)   TempSrc: Oral   Resp: 18 14  SpO2: 100% 98%    Physical Exam  Constitutional: Appears malnourished, pale, no distress  HENT: Normocephalic. No tonsillar erythema or exudates Eyes: Conjunctivae are normal. No scleral icterus.  Neck: Normal ROM. Neck supple. No JVD. No tracheal deviation. No thyromegaly.  CVS: RRR, S1/S2 +, no murmurs, no gallops, no carotid bruit.  Pulmonary: Effort and breath sounds normal, no stridor, rhonchi, wheezes, rales.  Abdominal: Soft. BS +,  no distension, tenderness, rebound or guarding.  Musculoskeletal: Normal range of motion. No edema and no tenderness.  Lymphadenopathy: No lymphadenopathy noted, cervical, inguinal. Neuro: Alert. Normal reflexes, muscle tone coordination. No focal neurologic deficits. Skin: Skin is warm and dry. No rash noted.  No erythema. No pallor.  Psychiatric: Normal mood and affect. Behavior, judgment, thought content normal.   Labs on Admission:  Basic Metabolic Panel:  Recent Labs Lab 10/27/14 0809 11/01/14 0847  NA 128* 129*  K 4.0 3.6  CL  --  95*  CO2 23 23  GLUCOSE 98 108*  BUN 3.4* 9  CREATININE 0.6* 0.47*  CALCIUM 8.4 8.3*   Liver Function Tests:  Recent Labs Lab 10/27/14 0809 11/01/14 0847  AST 77* 79*  ALT 27 32  ALKPHOS 1,235* 1446*  BILITOT 2.42* 5.0*  PROT 6.7 7.2  ALBUMIN 1.8* 2.1*   No results for input(s): LIPASE, AMYLASE in the last 168 hours. No results for input(s): AMMONIA in the last 168 hours. CBC:  Recent Labs Lab 10/27/14 0809 11/01/14 0847  WBC 6.0 8.2  NEUTROABS 4.3 5.8  HGB 8.7* 9.8*  HCT 26.4* 30.4*  MCV  86.7 88.6  PLT 479* 565*   Cardiac Enzymes: No results for input(s): CKTOTAL, CKMB, CKMBINDEX, TROPONINI in the last 168 hours. BNP: Invalid input(s): POCBNP CBG: No results for input(s): GLUCAP in the last 168 hours.  If 7PM-7AM, please contact night-coverage [email or web address] Password [email or web address] 11/01/2014, 11:50 AM

## 2014-11-01 NOTE — ED Provider Notes (Signed)
CSN: [id number]     Arrival date & time 11/01/14  0174 History   First MD Initiated Contact with Patient 11/01/14 0818     Chief Complaint  Patient presents with  . Abdominal Pain     (Consider location/radiation/quality/duration/timing/severity/associated sxs/prior Treatment) HPI Comments: The patient is a 69 year old male, he presents with increased abdominal pain, nausea and vomiting which started yesterday at approximately noon. He was recently admitted to the hospital for a high-grade bowel obstruction. This resolved nonsurgically and the patient was discharged. He follows with oncology secondary to a history of a recently diagnosed metastatic cancer, unclear primary, metastatic disease to the lungs as well as carcinomatosis of the abdomen. He has had some chemotherapy in the past but has had some complications and is not currently getting chemotherapy. He had a right hemicolectomy after the diagnosis. He states that he has had multiple episodes of vomiting, increased abdominal pain, increased abdominal swelling which is all been acute since yesterday. He feels this is very similar to the bowel obstruction that he suffered one week ago.  Patient is a 69 y.o. male presenting with abdominal pain. The history is provided by the patient, the spouse and medical records.  Abdominal Pain   Past Medical History  Diagnosis Date  . Tremor     takes Primidone 250 once daily and inderall 160 daily for this-has had it since he was 21  . Pneumonia 06/2012  . Macular degeneration   . Benign essential tremor   . Coronary artery disease   . Anemia 06/30/2014  . 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 Essential hypertension 08/18/2014  . 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 Ascending aortic aneurysm 08/2014  . Sepsis 09/2014  . GERD (gastroesophageal reflux disease)   . Postoperative intra-abdominal abscess 09/2014  . SBO (small bowel obstruction)     recurrent  .  Hyperlipidemia   . Hypoalbuminemia 09/2014   Past Surgical History  Procedure Laterality Date  . Tonsillectomy    . Video bronchoscopy Bilateral 04/29/2013    Procedure: VIDEO BRONCHOSCOPY WITHOUT FLUORO;  Surgeon: Collene Gobble, MD;  Location: [facility];  Service: Cardiopulmonary;  Laterality: Bilateral;  . Laparotomy N/A 09/09/2014    Procedure: EXPLORATORY LAPAROTOMY resection terminal ileum and right colon;  Surgeon: Excell Seltzer, MD;  Location: [facility];  Service: General;  Laterality: N/A;  . Bowel resection N/A 09/09/2014    Procedure: SMALL BOWEL bypass;  Surgeon: Excell Seltzer, MD;  Location: [facility];  Service: General;  Laterality: N/A;  . Colostomy revision  09/09/2014    Procedure: COLON RESECTION RIGHT;  Surgeon: Excell Seltzer, MD;  Location: [facility];  Service: General;;  . Abcess drainage  10/05/2014    intra-abd abscess, drain placed by IR   Family History  Problem Relation Age of Onset  . CAD Father 35    Died MI  . Atrial fibrillation Father   . Asthma Father   . Alzheimer's disease Mother   . Diabetes Mother   . Heart disease Mother   . Atrial fibrillation Mother   . Seizures Son     Died status epilepticus   History  Substance Use Topics  . Smoking status: Never Smoker   . Smokeless tobacco: Never Used  . Alcohol Use: No     Comment: wine with dinner    Review of Systems  Gastrointestinal: Positive for abdominal pain.  All other systems reviewed and are negative.     Allergies  Review of patient's allergies indicates no  known allergies.  Home Medications   Prior to Admission medications   Medication Sig Start Date End Date Taking? Authorizing Provider  acetaminophen (TYLENOL) 500 MG tablet Take 1,000 mg by mouth every 6 (six) hours as needed for moderate pain or headache.   Yes Historical Provider, MD  ciprofloxacin (CIPRO) 500 MG tablet Take 1 tablet (500 mg total) by mouth 2 (two) times daily. 10/09/14  Yes Janece Canterbury, MD   diphenoxylate-atropine (LOMOTIL) 2.5-0.025 MG per tablet Take 1 tablet by mouth 4 (four) times daily as needed for diarrhea or loose stools. 10/05/14  Yes Ladell Pier, MD  furosemide (LASIX) 20 MG tablet Take 1 tablet (20 mg total) by mouth daily. 10/28/14  Yes Ladell Pier, MD  lidocaine (LIDODERM) 5 % Place 1 patch onto the skin daily. Remove & Discard patch within 12 hours or as directed by MD 10/12/14  Yes Janece Canterbury, MD  lidocaine-prilocaine (EMLA) cream Apply 1 application topically as needed. Apply to [facility] 1-2 hours prior to stick and cover with plastic wrap 07/08/14  Yes Ladell Pier, MD  megestrol (MEGACE) 40 MG/ML suspension Take 10 mLs (400 mg total) by mouth daily. Patient taking differently: Take 200 mg by mouth 2 (two) times daily.  10/09/14  Yes Janece Canterbury, MD  metroNIDAZOLE (FLAGYL) 500 MG tablet Take 1 tablet (500 mg total) by mouth 3 (three) times daily. Patient taking differently: Take 500 mg by mouth 2 (two) times daily.  10/09/14  Yes Janece Canterbury, MD  Multiple Vitamins-Minerals (PRESERVISION AREDS PO) Take 2 capsules by mouth every morning.    Yes Historical Provider, MD  ondansetron (ZOFRAN ODT) 4 MG disintegrating tablet Take 1 tablet (4 mg total) by mouth every 4 (four) hours as needed for nausea or vomiting. 08/11/14  Yes Ladell Pier, MD  oxyCODONE (OXY IR/ROXICODONE) 5 MG immediate release tablet Take 1-2 tablets (5-10 mg total) by mouth every 4 (four) hours as needed for severe pain. 07/28/14  Yes Ladell Pier, MD  pantoprazole (PROTONIX) 40 MG tablet Take 1 tablet (40 mg total) by mouth 2 (two) times daily. 10/09/14  Yes Janece Canterbury, MD  potassium chloride (K-DUR) 10 MEQ tablet Take 2 tablets (20 mEq total) by mouth daily. 10/28/14  Yes Ladell Pier, MD  primidone (MYSOLINE) 250 MG tablet Take 1 tablet (250 mg total) by mouth at bedtime. Patient taking differently: Take 250 mg by mouth every morning.  10/21/13  Yes Asencion Partridge Dohmeier, MD   prochlorperazine (COMPAZINE) 10 MG tablet Take 1 tablet (10 mg total) by mouth every 6 (six) hours as needed for nausea. 07/08/14  Yes Ladell Pier, MD  propranolol (INDERAL) 10 MG tablet TAKE 1 TABLET BY MOUTH 2 TIMES DAILY. Patient taking differently: TAKE 2 TABLET BY MOUTH ONCE  DAILY. 10/28/14  Yes Ladell Pier, MD  saccharomyces boulardii (FLORASTOR) 250 MG capsule Take 1 capsule (250 mg total) by mouth 2 (two) times daily. 09/18/14  Yes Donne Hazel, MD  sucralfate (CARAFATE) 1 G tablet Take 1 tablet (1 g total) by mouth 4 (four) times daily -  with meals and at bedtime. Chew prior to swallowing 10/09/14  Yes Janece Canterbury, MD   BP 121/68 mmHg  Pulse 80  Temp(Src) 98.2 F (36.8 C) (Oral)  Resp 16  SpO2 100% Physical Exam  Constitutional: He appears well-developed and well-nourished. No distress.  Uncomfortable appearing  HENT:  Head: Normocephalic and atraumatic.  Mouth/Throat: Oropharynx is clear and moist. No oropharyngeal exudate.  Eyes: Conjunctivae and EOM are normal. Pupils are equal, round, and reactive to light. Right eye exhibits no discharge. Left eye exhibits no discharge. No scleral icterus.  Neck: Normal range of motion. Neck supple. No JVD present. No thyromegaly present.  Cardiovascular: Normal rate, regular rhythm, normal heart sounds and intact distal pulses.  Exam reveals no gallop and no friction rub.   No murmur heard. Pulmonary/Chest: Effort normal and breath sounds normal. No respiratory distress. He has no wheezes. He has no rales.  Abdominal: Soft. He exhibits distension. He exhibits no mass. There is tenderness. There is guarding.  Tympanitic sounds to percussion, diffuse abdominal tenderness and swelling, drainage catheter in the right lower abdomen with small amount of clear yellow fluid. High-pitched tinkling bowel sounds  Musculoskeletal: Normal range of motion. He exhibits no edema or tenderness.  Lymphadenopathy:    He has no cervical adenopathy.   Neurological: He is alert. Coordination normal.  Skin: Skin is warm and dry. No rash noted. No erythema.  Psychiatric: He has a normal mood and affect. His behavior is normal.  Nursing note and vitals reviewed.   ED Course  Procedures (including critical care time) Labs Review Labs Reviewed  CBC WITH DIFFERENTIAL/PLATELET - Abnormal; Notable for the following:    RBC 3.43 (*)    Hemoglobin 9.8 (*)    HCT 30.4 (*)    RDW 24.7 (*)    Platelets 565 (*)    All other components within normal limits  COMPREHENSIVE METABOLIC PANEL - Abnormal; Notable for the following:    Sodium 129 (*)    Chloride 95 (*)    Glucose, Bld 108 (*)    Creatinine, Ser 0.47 (*)    Calcium 8.3 (*)    Albumin 2.1 (*)    AST 79 (*)    Alkaline Phosphatase 1446 (*)    Total Bilirubin 5.0 (*)    All other components within normal limits  COMPREHENSIVE METABOLIC PANEL  MAGNESIUM  PHOSPHORUS  CBC WITH DIFFERENTIAL/PLATELET  APTT  PROTIME-INR  TSH    Imaging Review Dg Abd Acute W/chest  11/01/2014   CLINICAL DATA:  Abdominal pain and distention today. History of recent bowel obstruction an abscess drainage.  EXAM: DG ABDOMEN ACUTE W/ 1V CHEST  COMPARISON:  10/26/2014 radiograph and CT scan 10/24/2014  FINDINGS: The upright chest x-ray demonstrates a stable are poor. There are small bilateral pleural effusions and overlying atelectasis but no infiltrates or edema. The heart is normal in size. The mediastinal and hilar contours are stable.  Two views of the abdomen demonstrate a stable drainage catheter in the region of the right pericolic gutter. Dilated small bowel loops with air-fluid levels suggesting recurrent small bowel obstruction. Persistent ascites.  IMPRESSION: 1. Small bilateral pleural effusions and bibasilar atelectasis. 2. Suspect recurrent small bowel obstruction and ascites.   Electronically Signed   By: Marijo Sanes M.D.   On: 11/01/2014 10:45     MDM   Final diagnoses:  Abdominal pain   Small bowel obstruction    The patient has significant abdominal increased girth, increased pain and vomiting CONSISTENT with bowel obstruction type pathology. Symptomatically the patient will need to be admitted to the hospital, labs pending, acute abdominal series.  Labs unremarkable, x-rays confirm small bowel obstruction, discussed with hospitalist who will admit, requests surgical consult, surgery paged.  NG tube placed  Patient improved after medications.    D/w Dr. Marcello Moores who will consult.  Noemi Chapel, MD 11/01/14 1550

## 2014-11-01 NOTE — ED Notes (Signed)
Spoke with Network engineer on 5W. Charge RN unavailable at this moment. Will return call in 5 minutes.

## 2014-11-02 ENCOUNTER — Inpatient Hospital Stay (HOSPITAL_COMMUNITY): Payer: PPO

## 2014-11-02 DIAGNOSIS — R7989 Other specified abnormal findings of blood chemistry: Secondary | ICD-10-CM

## 2014-11-02 DIAGNOSIS — K5660 Unspecified intestinal obstruction: Secondary | ICD-10-CM

## 2014-11-02 DIAGNOSIS — D509 Iron deficiency anemia, unspecified: Secondary | ICD-10-CM

## 2014-11-02 DIAGNOSIS — E46 Unspecified protein-calorie malnutrition: Secondary | ICD-10-CM

## 2014-11-02 DIAGNOSIS — R109 Unspecified abdominal pain: Secondary | ICD-10-CM

## 2014-11-02 LAB — CBC
HEMATOCRIT: 27.3 % — AB (ref 39.0–52.0)
Hemoglobin: 8.7 g/dL — ABNORMAL LOW (ref 13.0–17.0)
MCH: 28.8 pg (ref 26.0–34.0)
MCHC: 31.9 g/dL (ref 30.0–36.0)
MCV: 90.4 fL (ref 78.0–100.0)
PLATELETS: 502 10*3/uL — AB (ref 150–400)
RBC: 3.02 MIL/uL — AB (ref 4.22–5.81)
RDW: 24.8 % — ABNORMAL HIGH (ref 11.5–15.5)
WBC: 6.9 10*3/uL (ref 4.0–10.5)

## 2014-11-02 LAB — BASIC METABOLIC PANEL
Anion gap: 9 (ref 5–15)
BUN: 10 mg/dL (ref 6–20)
CHLORIDE: 96 mmol/L — AB (ref 101–111)
CO2: 23 mmol/L (ref 22–32)
CREATININE: 0.53 mg/dL — AB (ref 0.61–1.24)
Calcium: 8.1 mg/dL — ABNORMAL LOW (ref 8.9–10.3)
Glucose, Bld: 99 mg/dL (ref 65–99)
Potassium: 3.5 mmol/L (ref 3.5–5.1)
Sodium: 128 mmol/L — ABNORMAL LOW (ref 135–145)

## 2014-11-02 MED ORDER — BISACODYL 10 MG RE SUPP
10.0000 mg | Freq: Once | RECTAL | Status: AC
  Administered 2014-11-02: 10 mg via RECTAL
  Filled 2014-11-02: qty 1

## 2014-11-02 NOTE — Progress Notes (Signed)
Patient did not have telemetry. Multiple different orders with one saying telemetry and others saying med-surge. Patient stated he had no heart problems, and that MD told him he did not have to have telemetry on. Patient said he was fine without it.

## 2014-11-02 NOTE — Progress Notes (Signed)
Patient ID: Wesley Dillon, male   DOB: March 11, 1946, 69 y.o.   MRN: [id number]     [facility]      [address] [place], [place] [address]    Phone: [phone] FAX: [phone]     Subjective: Burping, hiccups and nausea.  zofran works.  No flatus.  Last BM on Saturday.  AXR with SBO.    Objective:  Vital signs:  Filed Vitals:   11/01/14 1616 11/01/14 1700 11/01/14 2145 11/02/14 0355  BP:   121/79 114/71  Pulse:   80 83  Temp:   97.6 F (36.4 C) 98.2 F (36.8 C)  TempSrc:   Oral Oral  Resp:   18 18  Height: 6' 1"  (1.854 m)     Weight:  69.1 kg (152 lb 5.4 oz)  69.8 kg (153 lb 14.1 oz)  SpO2:   99% 98%    Last BM Date: 10/31/14  Intake/Output   Yesterday:  07/10 0701 - 07/11 0700 In: 1507.5 [I.V.:1207.5; IV Piggyback:300] Out: 400 [Urine:400] This shift: I/O last 3 completed shifts: In: 1507.5 [I.V.:1207.5; IV Piggyback:300] Out: 400 [Urine:400]    Physical Exam: General: Pt awake/alert/oriented x4 in no acute distress Abdomen: +BS, distended, non tender. RUQ drain.     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    Results:   Labs: Results for orders placed or performed during the hospital encounter of 11/01/14 (from the past 48 hour(s))  CBC with Differential/Platelet     Status: Abnormal   Collection Time: 11/01/14  8:47 AM  Result Value Ref Range   WBC 8.2 4.0 - 10.5 K/uL   RBC 3.43 (L) 4.22 - 5.81 MIL/uL   Hemoglobin 9.8 (L) 13.0 - 17.0 g/dL   HCT 30.4 (L) 39.0 - 52.0 %   MCV 88.6 78.0 - 100.0 fL   MCH 28.6 26.0 - 34.0 pg   MCHC 32.2 30.0 - 36.0 g/dL   RDW 24.7 (H) 11.5 - 15.5 %   Platelets 565 (H) 150 - 400 K/uL   Neutrophils Relative % 71 43 - 77 %   Lymphocytes Relative 17 12 - 46 %   Monocytes Relative 10 3 - 12 %   Eosinophils Relative 1 0 - 5 %   Basophils Relative 1 0 - 1 %   Neutro Abs 5.8 1.7 - 7.7 K/uL   Lymphs Abs 1.4 0.7 - 4.0 K/uL   Monocytes  Absolute 0.8 0.1 - 1.0 K/uL   Eosinophils Absolute 0.1 0.0 - 0.7 K/uL   Basophils Absolute 0.1 0.0 - 0.1 K/uL   RBC Morphology POLYCHROMASIA PRESENT     Comment: TARGET CELLS  Comprehensive metabolic panel     Status: Abnormal   Collection Time: 11/01/14  8:47 AM  Result Value Ref Range   Sodium 129 (L) 135 - 145 mmol/L   Potassium 3.6 3.5 - 5.1 mmol/L   Chloride 95 (L) 101 - 111 mmol/L   CO2 23 22 - 32 mmol/L   Glucose, Bld 108 (H) 65 - 99 mg/dL   BUN 9 6 - 20 mg/dL   Creatinine, Ser 0.47 (L) 0.61 - 1.24 mg/dL   Calcium 8.3 (L) 8.9 - 10.3 mg/dL   Total Protein 7.2 6.5 - 8.1 g/dL   Albumin 2.1 (L) 3.5 - 5.0 g/dL   AST 79 (H) 15 - 41 U/L   ALT 32 17 - 63 U/L   Alkaline Phosphatase 1446 (  H) 38 - 126 U/L   Total Bilirubin 5.0 (H) 0.3 - 1.2 mg/dL   GFR calc non Af Amer >60 >60 mL/min   GFR calc Af Amer >60 >60 mL/min    Comment: (NOTE) The eGFR has been calculated using the CKD EPI equation. This calculation has not been validated in all clinical situations. eGFR's persistently <60 mL/min signify possible Chronic Kidney Disease.    Anion gap 11 5 - 15  Comprehensive metabolic panel     Status: Abnormal   Collection Time: 11/01/14  5:15 PM  Result Value Ref Range   Sodium 130 (L) 135 - 145 mmol/L   Potassium 3.7 3.5 - 5.1 mmol/L   Chloride 97 (L) 101 - 111 mmol/L   CO2 24 22 - 32 mmol/L   Glucose, Bld 100 (H) 65 - 99 mg/dL   BUN 9 6 - 20 mg/dL   Creatinine, Ser 0.59 (L) 0.61 - 1.24 mg/dL   Calcium 8.3 (L) 8.9 - 10.3 mg/dL   Total Protein 7.0 6.5 - 8.1 g/dL   Albumin 2.1 (L) 3.5 - 5.0 g/dL   AST 73 (H) 15 - 41 U/L   ALT 31 17 - 63 U/L   Alkaline Phosphatase 1298 (H) 38 - 126 U/L   Total Bilirubin 5.3 (H) 0.3 - 1.2 mg/dL   GFR calc non Af Amer >60 >60 mL/min   GFR calc Af Amer >60 >60 mL/min    Comment: (NOTE) The eGFR has been calculated using the CKD EPI equation. This calculation has not been validated in all clinical situations. eGFR's persistently <60 mL/min signify  possible Chronic Kidney Disease.    Anion gap 9 5 - 15  Magnesium     Status: Abnormal   Collection Time: 11/01/14  5:15 PM  Result Value Ref Range   Magnesium 1.4 (L) 1.7 - 2.4 mg/dL  Phosphorus     Status: None   Collection Time: 11/01/14  5:15 PM  Result Value Ref Range   Phosphorus 3.5 2.5 - 4.6 mg/dL  CBC WITH DIFFERENTIAL     Status: Abnormal   Collection Time: 11/01/14  5:15 PM  Result Value Ref Range   WBC 8.2 4.0 - 10.5 K/uL   RBC 3.29 (L) 4.22 - 5.81 MIL/uL   Hemoglobin 9.5 (L) 13.0 - 17.0 g/dL   HCT 29.3 (L) 39.0 - 52.0 %   MCV 89.1 78.0 - 100.0 fL   MCH 28.9 26.0 - 34.0 pg   MCHC 32.4 30.0 - 36.0 g/dL   RDW 24.6 (H) 11.5 - 15.5 %   Platelets 521 (H) 150 - 400 K/uL   Neutrophils Relative % 66 43 - 77 %   Lymphocytes Relative 20 12 - 46 %   Monocytes Relative 12 3 - 12 %   Eosinophils Relative 1 0 - 5 %   Basophils Relative 1 0 - 1 %   Neutro Abs 5.4 1.7 - 7.7 K/uL   Lymphs Abs 1.6 0.7 - 4.0 K/uL   Monocytes Absolute 1.0 0.1 - 1.0 K/uL   Eosinophils Absolute 0.1 0.0 - 0.7 K/uL   Basophils Absolute 0.1 0.0 - 0.1 K/uL   RBC Morphology POLYCHROMASIA PRESENT     Comment: TARGET CELLS   WBC Morphology MILD LEFT SHIFT (1-5% METAS, OCC MYELO, OCC BANDS)   APTT     Status: None   Collection Time: 11/01/14  5:15 PM  Result Value Ref Range   aPTT 36 24 - 37 seconds  Protime-INR  Status: Abnormal   Collection Time: 11/01/14  5:15 PM  Result Value Ref Range   Prothrombin Time 15.8 (H) 11.6 - 15.2 seconds   INR 1.24 0.00 - 1.49  TSH     Status: Abnormal   Collection Time: 11/01/14  5:15 PM  Result Value Ref Range   TSH 7.198 (H) 0.350 - 4.500 uIU/mL  Basic metabolic panel     Status: Abnormal   Collection Time: 2014-11-24  3:45 AM  Result Value Ref Range   Sodium 128 (L) 135 - 145 mmol/L   Potassium 3.5 3.5 - 5.1 mmol/L   Chloride 96 (L) 101 - 111 mmol/L   CO2 23 22 - 32 mmol/L   Glucose, Bld 99 65 - 99 mg/dL   BUN 10 6 - 20 mg/dL   Creatinine, Ser 0.53 (L)  0.61 - 1.24 mg/dL   Calcium 8.1 (L) 8.9 - 10.3 mg/dL   GFR calc non Af Amer >60 >60 mL/min   GFR calc Af Amer >60 >60 mL/min    Comment: (NOTE) The eGFR has been calculated using the CKD EPI equation. This calculation has not been validated in all clinical situations. eGFR's persistently <60 mL/min signify possible Chronic Kidney Disease.    Anion gap 9 5 - 15  CBC     Status: Abnormal   Collection Time: 11/24/2014  3:45 AM  Result Value Ref Range   WBC 6.9 4.0 - 10.5 K/uL   RBC 3.02 (L) 4.22 - 5.81 MIL/uL   Hemoglobin 8.7 (L) 13.0 - 17.0 g/dL   HCT 27.3 (L) 39.0 - 52.0 %   MCV 90.4 78.0 - 100.0 fL   MCH 28.8 26.0 - 34.0 pg   MCHC 31.9 30.0 - 36.0 g/dL   RDW 24.8 (H) 11.5 - 15.5 %   Platelets 502 (H) 150 - 400 K/uL    Imaging / Studies: Mr 3d Recon At Scanner  11-24-2014   CLINICAL DATA:  Abdominal pain and distension. History of recent small bowel obstruction and abscess drainage. Peritoneal carcinomatosis with right hemicolectomy.  EXAM: MRI ABDOMEN WITHOUT AND WITH CONTRAST (INCLUDING MRCP)  TECHNIQUE: Multiplanar multisequence MR imaging of the abdomen was performed both before and after the administration of intravenous contrast. Heavily T2-weighted images of the biliary and pancreatic ducts were obtained, and three-dimensional MRCP images were rendered by post processing.  CONTRAST:  51m MULTIHANCE GADOBENATE DIMEGLUMINE 529 MG/ML IV SOLN  COMPARISON:  CT, most recent 10/24/2014.  FINDINGS: Portions of exam are mildly motion degraded.  Lower chest: Small left pleural effusion. Trace right pleural fluid. Heart size upper normal.  Hepatobiliary: Foci of hepatic hyper enhancement within the dome are nonspecific. Example image 17 of series 11001. There are foci of T2 hyperintensity and hypo enhancement along the hepatic capsule, suspicious for carcinomatosis/peritoneal metastasis.  Gallbladder distension with multiple gallstones. Moderate intrahepatic ductal dilatation, including left  hepatic duct at 8 mm on image 25 of series 3. Moderate common duct dilatation at 1.3 cm on image 87 of series 4. The common duct undergoes an abrupt transition in the porta hepatis. An approximately 2 cm span of narrowed to obliterated duct is identified. The distal most 2.5 cm of duct is normal in caliber at 6 mm on image 114 of series 400. On post-contrast imaging, there is a amorphous soft tissue and vague hyper enhancement along the tract of the narrowed -obliterated duct. Example images 62-65 of series 11002. No well-defined mass. Is felt to be just to the right of the pancreatic  head.  Pancreas: Otherwise within normal limits. No pancreatic duct dilatation or acute pancreatitis.  Spleen: Normal  Adrenals/Urinary Tract: Normal adrenal glands. Normal right kidney. 1.4 cm interpolar left renal cyst. No hydronephrosis.  Stomach/Bowel: Gastric distension. Diffuse fluid-filled small bowel loops.  Vascular/Lymphatic: Normal caliber of the aorta and branch vessels. 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 Example image 58 of series 11002. No well-defined retroperitoneal adenopathy.  Other: Ascites, moderate in volume. Areas of loculated ascites, including adjacent the caudate lobe of the liver.  Musculoskeletal: Probable hemangioma within the lower thoracic spine, eccentric right.  IMPRESSION: 1. Moderate intra and extrahepatic duct dilatation to the level of an abrupt cut off in the porta hepatis. Vague hyper enhancement along an approximately 2 cm area of common duct obliteration. If the patient has a known primary malignancy, this is suspicious for either ductal or periductal metastasis. Alternatively, given the clinical history of unknown primary, this could represent a primary extrahepatic cholangiocarcinoma. ERCP and/or percutaneous cholangiogram should be considered. These results will be called to the ordering clinician or representative by the Radiologist Assistant,  and communication documented in the PACS or zVision Dashboard. 2. 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 3. Cholelithiasis with gallbladder distention. 4. Mildly motion degraded exam. 5. Gastric and bowel distention, likely representing persistent bowel obstruction. The patient may benefit from nasogastric tube placement. 6. Loculated ascites and peritoneal carcinomatosis, as before. 7. Left larger than right pleural effusions.   Electronically Signed   By: Abigail Miyamoto M.D.   On: 11/02/2014 08:52   Dg Abd 2 Views  11/02/2014   CLINICAL DATA:  Abdominal distention with bowel obstruction  EXAM: ABDOMEN - 2 VIEW  COMPARISON:  November 01, 2014  FINDINGS: Supine and upright images obtained. Drain remains in the lateral right abdomen with postoperative change in the right abdomen. There is small bowel dilatation with multiple air-fluid levels in a pattern concerning for obstruction. No free air. There is vascular calcification the pelvis.  IMPRESSION: The bowel gas pattern is consistent with a degree of obstruction. No free air. Drain remains in the lateral right abdomen. Overall appearance is similar compared to 1 day prior.   Electronically Signed   By: Lowella Grip III M.D.   On: 11/02/2014 08:44   Dg Abd Acute W/chest  11/01/2014   CLINICAL DATA:  Abdominal pain and distention today. History of recent bowel obstruction an abscess drainage.  EXAM: DG ABDOMEN ACUTE W/ 1V CHEST  COMPARISON:  10/26/2014 radiograph and CT scan 10/24/2014  FINDINGS: The upright chest x-ray demonstrates a stable are poor. There are small bilateral pleural effusions and overlying atelectasis but no infiltrates or edema. The heart is normal in size. The mediastinal and hilar contours are stable.  Two views of the abdomen demonstrate a stable drainage catheter in the region of the right pericolic gutter. Dilated small bowel loops with air-fluid levels  suggesting recurrent small bowel obstruction. Persistent ascites.  IMPRESSION: 1. Small bilateral pleural effusions and bibasilar atelectasis. 2. Suspect recurrent small bowel obstruction and ascites.   Electronically Signed   By: Marijo Sanes M.D.   On: 11/01/2014 10:45   Mr Abd W/wo Cm/mrcp  11/02/2014   CLINICAL DATA:  Abdominal pain and distension. History of recent small bowel obstruction and abscess drainage. Peritoneal carcinomatosis with right hemicolectomy.  EXAM: MRI ABDOMEN WITHOUT AND WITH CONTRAST (INCLUDING MRCP)  TECHNIQUE: Multiplanar multisequence MR imaging  of the abdomen was performed both before and after the administration of intravenous contrast. Heavily T2-weighted images of the biliary and pancreatic ducts were obtained, and three-dimensional MRCP images were rendered by post processing.  CONTRAST:  29m MULTIHANCE GADOBENATE DIMEGLUMINE 529 MG/ML IV SOLN  COMPARISON:  CT, most recent 10/24/2014.  FINDINGS: Portions of exam are mildly motion degraded.  Lower chest: Small left pleural effusion. Trace right pleural fluid. Heart size upper normal.  Hepatobiliary: Foci of hepatic hyper enhancement within the dome are nonspecific. Example image 17 of series 11001. There are foci of T2 hyperintensity and hypo enhancement along the hepatic capsule, suspicious for carcinomatosis/peritoneal metastasis.  Gallbladder distension with multiple gallstones. Moderate intrahepatic ductal dilatation, including left hepatic duct at 8 mm on image 25 of series 3. Moderate common duct dilatation at 1.3 cm on image 87 of series 4. The common duct undergoes an abrupt transition in the porta hepatis. An approximately 2 cm span of narrowed to obliterated duct is identified. The distal most 2.5 cm of duct is normal in caliber at 6 mm on image 114 of series 400. On post-contrast imaging, there is a amorphous soft tissue and vague hyper enhancement along the tract of the narrowed -obliterated duct. Example images  62-65 of series 11002. No well-defined mass. Is felt to be just to the right of the pancreatic head.  Pancreas: Otherwise within normal limits. No pancreatic duct dilatation or acute pancreatitis.  Spleen: Normal  Adrenals/Urinary Tract: Normal adrenal glands. Normal right kidney. 1.4 cm interpolar left renal cyst. No hydronephrosis.  Stomach/Bowel: Gastric distension. Diffuse fluid-filled small bowel loops.  Vascular/Lymphatic: Normal caliber of the aorta and branch vessels. 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 Example image 58 of series 11002. No well-defined retroperitoneal adenopathy.  Other: Ascites, moderate in volume. Areas of loculated ascites, including adjacent the caudate lobe of the liver.  Musculoskeletal: Probable hemangioma within the lower thoracic spine, eccentric right.  IMPRESSION: 1. Moderate intra and extrahepatic duct dilatation to the level of an abrupt cut off in the porta hepatis. Vague hyper enhancement along an approximately 2 cm area of common duct obliteration. If the patient has a known primary malignancy, this is suspicious for either ductal or periductal metastasis. Alternatively, given the clinical history of unknown primary, this could represent a primary extrahepatic cholangiocarcinoma. ERCP and/or percutaneous cholangiogram should be considered. These results will be called to the ordering clinician or representative by the Radiologist Assistant, and communication documented in the PACS or zVision Dashboard. 2. 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 3. Cholelithiasis with gallbladder distention. 4. Mildly motion degraded exam. 5. Gastric and bowel distention, likely representing persistent bowel obstruction. The patient may benefit from nasogastric tube placement. 6. Loculated ascites and peritoneal carcinomatosis, as before. 7. Left larger than right  pleural effusions.   Electronically Signed   By: KAbigail MiyamotoM.D.   On: 11/02/2014 08:52    Medications / Allergies:  Scheduled Meds: . bisacodyl  10 mg Rectal Once  . ciprofloxacin  400 mg Intravenous Q12H  . metronidazole  500 mg Intravenous Q8H  . pantoprazole (PROTONIX) IV  40 mg Intravenous Q12H  . primidone  250 mg Oral q morning - 10a  . propranolol  10 mg Oral BID  . saccharomyces boulardii  250 mg Oral BID  . senna  1 tablet Oral Daily  . sucralfate  1 g Oral TID WC &  HS   Continuous Infusions: . sodium chloride 75 mL/hr at 11/02/14 0120   PRN Meds:.acetaminophen **OR** acetaminophen, HYDROmorphone (DILAUDID) injection, ondansetron **OR** ondansetron (ZOFRAN) IV, sodium chloride  Antibiotics: Anti-infectives    Start     Dose/Rate Route Frequency Ordered Stop   11/01/14 1800  ciprofloxacin (CIPRO) IVPB 400 mg     400 mg 200 mL/hr over 60 Minutes Intravenous Every 12 hours 11/01/14 1707     11/01/14 1800  metroNIDAZOLE (FLAGYL) IVPB 500 mg     500 mg 100 mL/hr over 60 Minutes Intravenous Every 8 hours 11/01/14 1707          Assessment/Plan Recurrent small bowel obstruction Abdominal carcinomatosis  -I think he would benefit from NGT decompression, but will await Dr. Earlie Server discussion with the patient. -give dulcolax suppository -mobilize, IV hydration, pain control and ant-emetics. Abnormal LFTs-MRCP questions cholangiocarcinoma as primary.  Dr. [name] d/w patient.  I called [facility].  Hx exploratory laparotomy resection TI and Rt colon, small bowel bypass complicated by post op abscess---Dr. Excell Seltzer 09/09/14--on cipro/flagyl and drain   Erby Pian, [facility] Pager [phone])   11/02/2014 10:12 AM

## 2014-11-02 NOTE — Consult Note (Signed)
Consultation  Referring Provider:  [facility]    Primary Care Physician:  Gerrit Heck, MD Primary Gastroenterologist:   Dr. [name]      Reason for Consultation:   Elevated LFTs  / Biliary duct dilation         HPI:   Wesley Dillon is a 69 y.o. male diagnosed with carcinomatosis in March of this year. He had been having abdominal pain and underwent GI workup by Dr. [name]. Ultimately an u/s revealed gallstones but while waiting for cholecystectomy patient developed a bowel obstruction. CTscan showed carcinomatosis. Omental biopsy c/w adenocarcinoma. No clear primary tumor site was found but PET suggested small bowel primary. Patient readmitted a couple of times since then with SBO. In May a CTscan suggested an apple core lesion of small bowel. In May patient underwent exploratory laparotomy with a resection of terminal ileum and right colon. Biopsies c/w adenocarcinoma. Post-op course complicated by an abdominal abscess. Patient has been undergoing chemo with Dr. Learta Codding. He had another admission earlier this month for abdominal pain and nausea. CTscan revealed dilated small bowel but follow up xray negative for obstruction and patient responded to bowel rest. A repeat CTscan has now revealed moderate biliary duct dilation,  he is quite cholestatic. Patient is back in hospital with recurrent SBO.   Past Medical History  Diagnosis Date  . Tremor     takes Primidone 250 once daily and inderall 160 daily for this-has had it since he was 21  . Pneumonia 06/2012  . Macular degeneration   . Benign essential tremor   . Coronary artery disease   . Anemia 06/30/2014  . 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 Essential hypertension 08/18/2014  . 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 Ascending aortic aneurysm 08/2014  . Sepsis 09/2014  . GERD (gastroesophageal reflux disease)   . Postoperative intra-abdominal abscess 09/2014  . SBO (small bowel  obstruction)     recurrent  . Hyperlipidemia   . Hypoalbuminemia 09/2014    Past Surgical History  Procedure Laterality Date  . Tonsillectomy    . Video bronchoscopy Bilateral 04/29/2013    Procedure: VIDEO BRONCHOSCOPY WITHOUT FLUORO;  Surgeon: Collene Gobble, MD;  Location: [facility] ENDOSCOPY;  Service: Cardiopulmonary;  Laterality: Bilateral;  . Laparotomy N/A 09/09/2014    Procedure: EXPLORATORY LAPAROTOMY resection terminal ileum and right colon;  Surgeon: Excell Seltzer, MD;  Location: [facility] ORS;  Service: General;  Laterality: N/A;  . Bowel resection N/A 09/09/2014    Procedure: SMALL BOWEL bypass;  Surgeon: Excell Seltzer, MD;  Location: [facility] ORS;  Service: General;  Laterality: N/A;  . Colostomy revision  09/09/2014    Procedure: COLON RESECTION RIGHT;  Surgeon: Excell Seltzer, MD;  Location: [facility] ORS;  Service: General;;  . Abcess drainage  10/05/2014    intra-abd abscess, drain placed by IR    Family History  Problem Relation Age of Onset  . CAD Father 63    Died MI  . Atrial fibrillation Father   . Asthma Father   . Alzheimer's disease Mother   . Diabetes Mother   . Heart disease Mother   . Atrial fibrillation Mother   . Seizures Son     Died status epilepticus     History  Substance Use Topics  . Smoking status: Never Smoker   . Smokeless tobacco: Never Used  . Alcohol Use: No     Comment: wine with dinner    Prior to Admission medications  Medication Sig Start Date End Date Taking? Authorizing Provider  acetaminophen (TYLENOL) 500 MG tablet Take 1,000 mg by mouth every 6 (six) hours as needed for moderate pain or headache.   Yes Historical Provider, MD  ciprofloxacin (CIPRO) 500 MG tablet Take 1 tablet (500 mg total) by mouth 2 (two) times daily. 10/09/14  Yes Janece Canterbury, MD  diphenoxylate-atropine (LOMOTIL) 2.5-0.025 MG per tablet Take 1 tablet by mouth 4 (four) times daily as needed for diarrhea or loose stools. 10/05/14  Yes Ladell Pier, MD  furosemide  (LASIX) 20 MG tablet Take 1 tablet (20 mg total) by mouth daily. 10/28/14  Yes Ladell Pier, MD  lidocaine (LIDODERM) 5 % Place 1 patch onto the skin daily. Remove & Discard patch within 12 hours or as directed by MD 10/12/14  Yes Janece Canterbury, MD  lidocaine-prilocaine (EMLA) cream Apply 1 application topically as needed. Apply to [facility] 1-2 hours prior to stick and cover with plastic wrap 07/08/14  Yes Ladell Pier, MD  megestrol (MEGACE) 40 MG/ML suspension Take 10 mLs (400 mg total) by mouth daily. Patient taking differently: Take 200 mg by mouth 2 (two) times daily.  10/09/14  Yes Janece Canterbury, MD  metroNIDAZOLE (FLAGYL) 500 MG tablet Take 1 tablet (500 mg total) by mouth 3 (three) times daily. Patient taking differently: Take 500 mg by mouth 2 (two) times daily.  10/09/14  Yes Janece Canterbury, MD  Multiple Vitamins-Minerals (PRESERVISION AREDS PO) Take 2 capsules by mouth every morning.    Yes Historical Provider, MD  ondansetron (ZOFRAN ODT) 4 MG disintegrating tablet Take 1 tablet (4 mg total) by mouth every 4 (four) hours as needed for nausea or vomiting. 08/11/14  Yes Ladell Pier, MD  oxyCODONE (OXY IR/ROXICODONE) 5 MG immediate release tablet Take 1-2 tablets (5-10 mg total) by mouth every 4 (four) hours as needed for severe pain. 07/28/14  Yes Ladell Pier, MD  pantoprazole (PROTONIX) 40 MG tablet Take 1 tablet (40 mg total) by mouth 2 (two) times daily. 10/09/14  Yes Janece Canterbury, MD  potassium chloride (K-DUR) 10 MEQ tablet Take 2 tablets (20 mEq total) by mouth daily. 10/28/14  Yes Ladell Pier, MD  primidone (MYSOLINE) 250 MG tablet Take 1 tablet (250 mg total) by mouth at bedtime. Patient taking differently: Take 250 mg by mouth every morning.  10/21/13  Yes Asencion Partridge Dohmeier, MD  prochlorperazine (COMPAZINE) 10 MG tablet Take 1 tablet (10 mg total) by mouth every 6 (six) hours as needed for nausea. 07/08/14  Yes Ladell Pier, MD  propranolol (INDERAL) 10 MG tablet TAKE 1  TABLET BY MOUTH 2 TIMES DAILY. Patient taking differently: TAKE 2 TABLET BY MOUTH ONCE  DAILY. 10/28/14  Yes Ladell Pier, MD  saccharomyces boulardii (FLORASTOR) 250 MG capsule Take 1 capsule (250 mg total) by mouth 2 (two) times daily. 09/18/14  Yes Donne Hazel, MD  sucralfate (CARAFATE) 1 G tablet Take 1 tablet (1 g total) by mouth 4 (four) times daily -  with meals and at bedtime. Chew prior to swallowing 10/09/14  Yes Janece Canterbury, MD    Current Facility-Administered Medications  Medication Dose Route Frequency Provider Last Rate Last Dose  . 0.9 %  sodium chloride infusion   Intravenous Continuous Robbie Lis, MD 75 mL/hr at 11/02/14 0120    . acetaminophen (TYLENOL) tablet 650 mg  650 mg Oral Q6H PRN Robbie Lis, MD       Or  . acetaminophen (  TYLENOL) suppository 650 mg  650 mg Rectal Q6H PRN Robbie Lis, MD      . ciprofloxacin (CIPRO) IVPB 400 mg  400 mg Intravenous Q12H Robbie Lis, MD   400 mg at 11/02/14 0610  . HYDROmorphone (DILAUDID) injection 1 mg  1 mg Intravenous Q2H PRN Robbie Lis, MD   1 mg at 11/02/14 1401  . metroNIDAZOLE (FLAGYL) IVPB 500 mg  500 mg Intravenous Q8H Robbie Lis, MD   500 mg at 11/02/14 1009  . ondansetron (ZOFRAN) tablet 4 mg  4 mg Oral Q6H PRN Robbie Lis, MD       Or  . ondansetron [facility]) injection 4 mg  4 mg Intravenous Q6H PRN Robbie Lis, MD   4 mg at 11/02/14 1401  . pantoprazole (PROTONIX) injection 40 mg  40 mg Intravenous Q12H Robbie Lis, MD   40 mg at 11/02/14 1040  . primidone (MYSOLINE) tablet 250 mg  250 mg Oral q morning - 10a Robbie Lis, MD   250 mg at 11/02/14 1154  . propranolol (INDERAL) tablet 10 mg  10 mg Oral BID Robbie Lis, MD   10 mg at 11/02/14 1008  . saccharomyces boulardii (FLORASTOR) capsule 250 mg  250 mg Oral BID Robbie Lis, MD   250 mg at 11/02/14 1008  . senna (SENOKOT) tablet 8.6 mg  1 tablet Oral Daily Robbie Lis, MD   8.6 mg at 11/02/14 1008  . sodium chloride 0.9 % injection 10-40  mL  10-40 mL Intracatheter PRN Robbie Lis, MD   10 mL at 11/02/14 0345  . sucralfate (CARAFATE) tablet 1 g  1 g Oral TID WC & HS Robbie Lis, MD   1 g at 11/02/14 1232    Allergies as of 11/01/2014  . (No Known Allergies)    Review of Systems:    As per HPI, otherwise negative   Physical Exam:  Vital signs in last 24 hours: Temp:  [97.6 F (36.4 C)-98.2 F (36.8 C)] 98 F (36.7 C) (07/11 1422) Pulse Rate:  [80-83] 82 (07/11 1422) Resp:  [18] 18 (07/11 1422) BP: (110-121)/(70-79) 110/70 mmHg (07/11 1422) SpO2:  [96 %-99 %] 96 % (07/11 1422) Weight:  [152 lb 5.4 oz (69.1 kg)-153 lb 14.1 oz (69.8 kg)] 153 lb 14.1 oz (69.8 kg) (07/11 0355) Last BM Date: 10/31/14 General:   Pleasant white male in NAD cachectic appearing Head:  Normocephalic and atraumatic. Eyes:   Mildly icteric Neck:  Supple Lungs:  Respirations even and unlabored. Decreased bilateral bases Heart:  Regular rate and rhythm; no MRG Abdomen:  Firm moderately distended with hypoactive bowel sounds, drain in the right lower quadrant, well-healed midline abdominal scar Rectal:  Not performed.  Msk:  Symmetrical without gross deformities.  Extremities:  Without edema. Neurologic:  Alert and  oriented x4;  grossly normal neurologically. Skin:  Intact without significant lesions or rashes. Psych:  Alert and cooperative. Normal affect.  LAB RESULTS:  Recent Labs  11/01/14 0847 11/01/14 1715 11/02/14 0345  WBC 8.2 8.2 6.9  HGB 9.8* 9.5* 8.7*  HCT 30.4* 29.3* 27.3*  PLT 565* 521* 502*   BMET  Recent Labs  11/01/14 0847 11/01/14 1715 11/02/14 0345  NA 129* 130* 128*  K 3.6 3.7 3.5  CL 95* 97* 96*  CO2 23 24 23   GLUCOSE 108* 100* 99  BUN 9 9 10   CREATININE 0.47* 0.59* 0.53*  CALCIUM 8.3* 8.3* 8.1*  LFT  Recent Labs  11/01/14 1715  PROT 7.0  ALBUMIN 2.1*  AST 73*  ALT 31  ALKPHOS 1298*  BILITOT 5.3*   PT/INR  Recent Labs  11/01/14 1715  LABPROT 15.8*  INR 1.24    STUDIES: Mr  3d Recon At Scanner  11/02/2014   CLINICAL DATA:  Abdominal pain and distension. History of recent small bowel obstruction and abscess drainage. Peritoneal carcinomatosis with right hemicolectomy.  EXAM: MRI ABDOMEN WITHOUT AND WITH CONTRAST (INCLUDING MRCP)  TECHNIQUE: Multiplanar multisequence MR imaging of the abdomen was performed both before and after the administration of intravenous contrast. Heavily T2-weighted images of the biliary and pancreatic ducts were obtained, and three-dimensional MRCP images were rendered by post processing.  CONTRAST:  57m MULTIHANCE GADOBENATE DIMEGLUMINE 529 MG/ML IV SOLN  COMPARISON:  CT, most recent 10/24/2014.  FINDINGS: Portions of exam are mildly motion degraded.  Lower chest: Small left pleural effusion. Trace right pleural fluid. Heart size upper normal.  Hepatobiliary: Foci of hepatic hyper enhancement within the dome are nonspecific. Example image 17 of series 11001. There are foci of T2 hyperintensity and hypo enhancement along the hepatic capsule, suspicious for carcinomatosis/peritoneal metastasis.  Gallbladder distension with multiple gallstones. Moderate intrahepatic ductal dilatation, including left hepatic duct at 8 mm on image 25 of series 3. Moderate common duct dilatation at 1.3 cm on image 87 of series 4. The common duct undergoes an abrupt transition in the porta hepatis. An approximately 2 cm span of narrowed to obliterated duct is identified. The distal most 2.5 cm of duct is normal in caliber at 6 mm on image 114 of series 400. On post-contrast imaging, there is a amorphous soft tissue and vague hyper enhancement along the tract of the narrowed -obliterated duct. Example images 62-65 of series 11002. No well-defined mass. Is felt to be just to the right of the pancreatic head.  Pancreas: Otherwise within normal limits. No pancreatic duct dilatation or acute pancreatitis.  Spleen: Normal  Adrenals/Urinary Tract: Normal adrenal glands. Normal right  kidney. 1.4 cm interpolar left renal cyst. No hydronephrosis.  Stomach/Bowel: Gastric distension. Diffuse fluid-filled small bowel loops.  Vascular/Lymphatic: Normal caliber of the aorta and branch vessels. 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 Example image 58 of series 11002. No well-defined retroperitoneal adenopathy.  Other: Ascites, moderate in volume. Areas of loculated ascites, including adjacent the caudate lobe of the liver.  Musculoskeletal: Probable hemangioma within the lower thoracic spine, eccentric right.  IMPRESSION: 1. Moderate intra and extrahepatic duct dilatation to the level of an abrupt cut off in the porta hepatis. Vague hyper enhancement along an approximately 2 cm area of common duct obliteration. If the patient has a known primary malignancy, this is suspicious for either ductal or periductal metastasis. Alternatively, given the clinical history of unknown primary, this could represent a primary extrahepatic cholangiocarcinoma. ERCP and/or percutaneous cholangiogram should be considered. These results will be called to the ordering clinician or representative by the Radiologist Assistant, and communication documented in the PACS or zVision Dashboard. 2. 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 3. Cholelithiasis with gallbladder distention. 4. Mildly motion degraded exam. 5. Gastric and bowel distention, likely representing persistent bowel obstruction. The patient may benefit from nasogastric tube placement. 6. Loculated ascites and peritoneal carcinomatosis, as before. 7. Left larger than right pleural effusions.   Electronically Signed   By: KAbigail MiyamotoM.D.   On:  11/02/2014 08:52   Dg Abd 2 Views  11/02/2014   CLINICAL DATA:  Abdominal distention with bowel obstruction  EXAM: ABDOMEN - 2 VIEW  COMPARISON:  November 01, 2014  FINDINGS: Supine and upright images  obtained. Drain remains in the lateral right abdomen with postoperative change in the right abdomen. There is small bowel dilatation with multiple air-fluid levels in a pattern concerning for obstruction. No free air. There is vascular calcification the pelvis.  IMPRESSION: The bowel gas pattern is consistent with a degree of obstruction. No free air. Drain remains in the lateral right abdomen. Overall appearance is similar compared to 1 day prior.   Electronically Signed   By: Lowella Grip III M.D.   On: 11/02/2014 08:44   Mr Abd W/wo Cm/mrcp  11/02/2014   CLINICAL DATA:  Abdominal pain and distension. History of recent small bowel obstruction and abscess drainage. Peritoneal carcinomatosis with right hemicolectomy.  EXAM: MRI ABDOMEN WITHOUT AND WITH CONTRAST (INCLUDING MRCP)  TECHNIQUE: Multiplanar multisequence MR imaging of the abdomen was performed both before and after the administration of intravenous contrast. Heavily T2-weighted images of the biliary and pancreatic ducts were obtained, and three-dimensional MRCP images were rendered by post processing.  CONTRAST:  23m MULTIHANCE GADOBENATE DIMEGLUMINE 529 MG/ML IV SOLN  COMPARISON:  CT, most recent 10/24/2014.  FINDINGS: Portions of exam are mildly motion degraded.  Lower chest: Small left pleural effusion. Trace right pleural fluid. Heart size upper normal.  Hepatobiliary: Foci of hepatic hyper enhancement within the dome are nonspecific. Example image 17 of series 11001. There are foci of T2 hyperintensity and hypo enhancement along the hepatic capsule, suspicious for carcinomatosis/peritoneal metastasis.  Gallbladder distension with multiple gallstones. Moderate intrahepatic ductal dilatation, including left hepatic duct at 8 mm on image 25 of series 3. Moderate common duct dilatation at 1.3 cm on image 87 of series 4. The common duct undergoes an abrupt transition in the porta hepatis. An approximately 2 cm span of narrowed to obliterated duct  is identified. The distal most 2.5 cm of duct is normal in caliber at 6 mm on image 114 of series 400. On post-contrast imaging, there is a amorphous soft tissue and vague hyper enhancement along the tract of the narrowed -obliterated duct. Example images 62-65 of series 11002. No well-defined mass. Is felt to be just to the right of the pancreatic head.  Pancreas: Otherwise within normal limits. No pancreatic duct dilatation or acute pancreatitis.  Spleen: Normal  Adrenals/Urinary Tract: Normal adrenal glands. Normal right kidney. 1.4 cm interpolar left renal cyst. No hydronephrosis.  Stomach/Bowel: Gastric distension. Diffuse fluid-filled small bowel loops.  Vascular/Lymphatic: Normal caliber of the aorta and branch vessels. 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 Example image 58 of series 11002. No well-defined retroperitoneal adenopathy.  Other: Ascites, moderate in volume. Areas of loculated ascites, including adjacent the caudate lobe of the liver.  Musculoskeletal: Probable hemangioma within the lower thoracic spine, eccentric right.  IMPRESSION: 1. Moderate intra and extrahepatic duct dilatation to the level of an abrupt cut off in the porta hepatis. Vague hyper enhancement along an approximately 2 cm area of common duct obliteration. If the patient has a known primary malignancy, this is suspicious for either ductal or periductal metastasis. Alternatively, given the clinical history of unknown primary, this could represent a primary extrahepatic cholangiocarcinoma. ERCP and/or percutaneous cholangiogram should be considered. These results will be called to the ordering clinician or representative by the Radiologist Assistant, and communication documented  in the PACS or zVision Dashboard. 2. 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 3. Cholelithiasis with gallbladder distention. 4.  Mildly motion degraded exam. 5. Gastric and bowel distention, likely representing persistent bowel obstruction. The patient may benefit from nasogastric tube placement. 6. Loculated ascites and peritoneal carcinomatosis, as before. 7. Left larger than right pleural effusions.   Electronically Signed   By: Abigail Miyamoto M.D.   On: 11/02/2014 08:52           Impression / Plan:   69 year old male with carcinomatosis thought to be small bowel primary. PET suggested small bowel primary and patient had a long segment of bowel obstruction for which he underwent resection of TI and right colon in May. Path c/w adenocarcinoma. Now admitted with recurrent SBO, CTscan now showing moderate intra and extrahepatic duct dilatation to the level of an abrupt cut off in the porta hepatis. Vague hyper enhancement along an approximately 2 cm area of common duct obliteration. He is quite cholestatic. Rule out metastatic disease vrs primary malignancy such as cholangiocarcinoma.  Thanks   LOS: 1 day   Tye Savoy  11/02/2014, 3:56 PM  Patient seen, examined, and I agree with the above documentation, including the assessment and plan. 69 year old male with adenocarcinoma of unknown primary status post distal ileum and right colectomy complicated by anastomotic leak requiring percutaneous drain. Profound malnutrition. Status post 4 cycles of chemotherapy. Admitted with recurrent small bowel obstruction and rising LFTs consistent with biliary obstruction. MRCP images reviewed which show intra-and extra hepatobiliary ductal dilatation with fullness at the porta hepatis likely secondary to malignant lymph nodes causing obstruction versus metastasis to the bile duct versus extrahepatic cholangiocarcinoma. No evidence of cholangitis or symptomatic jaundice/pruritus, etc. Will need biliary ductal decompression either with ERCP versus percutaneous drain. ERCP preferable but this will depend on small bowel obstruction. We discussed  and he is aware that ERCP is relatively contraindicated in the setting of bowel obstruction. NG tube decompression If bowel obstruction improves we'll plan ERCP later this week. If not may need PTC Discussed the plan with him extensively.

## 2014-11-02 NOTE — Progress Notes (Signed)
Wesley Dillon from [facility] Radiology called report on MRI from 11/01/14 to RN. RN not available so Agricultural consultant took report. MD paged to make aware of results availability in EPIC.  Wesley Dillon [facility] 11/02/2014 9:24 AM

## 2014-11-02 NOTE — Progress Notes (Signed)
[facility] PROGRESS NOTE  Wesley Dillon GLO:[id number] DOB: [birth year] DOA: 11/01/2014 PCP: Gerrit Heck, MD  Assessment/Plan: 1. Recurrent SBO: Not improving, NG tube placed and connected to low intermittent suction.  NPO and gentle hydration.  Surgery consulted and recommendations given.   2. Abdominal carcinomatosis with unknown primary:  - s/p 4 cycles of chemo, followed by expl lap in 08/2014.  Further management as per oncology.    3. Elevated alk phos and elevated bilirubin:  MRI showed intra and extra hepatobiliary ductal dilatation.  Requested GI consult and plan for ERCP.    4. SEVERE Protein calorie malnutrition: - nutrition consulted.   Hyponatremia: persistent.   Code Status: FULL CODE  Family Communication: none at bedside.  Disposition Plan: remain inpatient.    Consultants:  Oncology  Surgery  GI  Procedures:  MRI ABDOMIN  Antibiotics:  NONE  HPI/Subjective: Distention worsening.   Objective: Filed Vitals:   2014/11/04 1422  BP: 110/70  Pulse: 82  Temp: 98 F (36.7 C)  Resp: 18    Intake/Output Summary (Last 24 hours) at 11/04/14 1939 Last data filed at 11/04/2014 1829  Gross per 24 hour  Intake 2332.5 ml  Output    501 ml  Net 1831.5 ml   Filed Weights   11/01/14 1323 11/01/14 1700 11/04/14 0355  Weight: 71.6 kg (157 lb 13.6 oz) 69.1 kg (152 lb 5.4 oz) 69.8 kg (153 lb 14.1 oz)    Exam:   General:  Alert in mild distress  Cardiovascular: s1s2  Respiratory: diminished breath sounds at bases.   Abdomen: firm, distended bowel sounds feeble  Musculoskeletal: no pedal edema , cyanosis or clubbing.   Data Reviewed: Basic Metabolic Panel:  Recent Labs Lab 10/27/14 0809 11/01/14 0847 11/01/14 1715 04-Nov-2014 0345  NA 128* 129* 130* 128*  K 4.0 3.6 3.7 3.5  CL  --  95* 97* 96*  CO2 _0 GLUCOSE 98 108* 100* 99  BUN 3.4* _1 CREATININE 0.6* 0.47* 0.59* 0.53*  CALCIUM 8.4 8.3* 8.3*  8.1*  MG  --   --  1.4*  --   PHOS  --   --  3.5  --    Liver Function Tests:  Recent Labs Lab 10/27/14 0809 11/01/14 0847 11/01/14 1715  AST 77* 79* 73*  ALT 27 32 31  ALKPHOS 1,235* 1446* 1298*  BILITOT 2.42* 5.0* 5.3*  PROT 6.7 7.2 7.0  ALBUMIN 1.8* 2.1* 2.1*   No results for input(s): LIPASE, AMYLASE in the last 168 hours. No results for input(s): AMMONIA in the last 168 hours. CBC:  Recent Labs Lab 10/27/14 0809 11/01/14 0847 11/01/14 1715 11-04-14 0345  WBC 6.0 8.2 8.2 6.9  NEUTROABS 4.3 5.8 5.4  --   HGB 8.7* 9.8* 9.5* 8.7*  HCT 26.4* 30.4* 29.3* 27.3*  MCV 86.7 88.6 89.1 90.4  PLT 479* 565* 521* 502*   Cardiac Enzymes: No results for input(s): CKTOTAL, CKMB, CKMBINDEX, TROPONINI in the last 168 hours. BNP (last 3 results)  Recent Labs  10/06/14 0545 10/11/14 0802  BNP 574.2* 223.3*    ProBNP (last 3 results) No results for input(s): PROBNP in the last 8760 hours.  CBG: No results for input(s): GLUCAP in the last 168 hours.  No results found for this or any previous visit (from the past 240 hour(s)).   Studies: Mr 3d Recon At Scanner  2014/11/04   CLINICAL DATA:  Abdominal pain and distension. History of recent small bowel obstruction  and abscess drainage. Peritoneal carcinomatosis with right hemicolectomy.  EXAM: MRI ABDOMEN WITHOUT AND WITH CONTRAST (INCLUDING MRCP)  TECHNIQUE: Multiplanar multisequence MR imaging of the abdomen was performed both before and after the administration of intravenous contrast. Heavily T2-weighted images of the biliary and pancreatic ducts were obtained, and three-dimensional MRCP images were rendered by post processing.  CONTRAST:  39m MULTIHANCE GADOBENATE DIMEGLUMINE 529 MG/ML IV SOLN  COMPARISON:  CT, most recent 10/24/2014.  FINDINGS: Portions of exam are mildly motion degraded.  Lower chest: Small left pleural effusion. Trace right pleural fluid. Heart size upper normal.  Hepatobiliary: Foci of hepatic hyper  enhancement within the dome are nonspecific. Example image 17 of series 11001. There are foci of T2 hyperintensity and hypo enhancement along the hepatic capsule, suspicious for carcinomatosis/peritoneal metastasis.  Gallbladder distension with multiple gallstones. Moderate intrahepatic ductal dilatation, including left hepatic duct at 8 mm on image 25 of series 3. Moderate common duct dilatation at 1.3 cm on image 87 of series 4. The common duct undergoes an abrupt transition in the porta hepatis. An approximately 2 cm span of narrowed to obliterated duct is identified. The distal most 2.5 cm of duct is normal in caliber at 6 mm on image 114 of series 400. On post-contrast imaging, there is a amorphous soft tissue and vague hyper enhancement along the tract of the narrowed -obliterated duct. Example images 62-65 of series 11002. No well-defined mass. Is felt to be just to the right of the pancreatic head.  Pancreas: Otherwise within normal limits. No pancreatic duct dilatation or acute pancreatitis.  Spleen: Normal  Adrenals/Urinary Tract: Normal adrenal glands. Normal right kidney. 1.4 cm interpolar left renal cyst. No hydronephrosis.  Stomach/Bowel: Gastric distension. Diffuse fluid-filled small bowel loops.  Vascular/Lymphatic: Normal caliber of the aorta and branch vessels. 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 Example image 58 of series 11002. No well-defined retroperitoneal adenopathy.  Other: Ascites, moderate in volume. Areas of loculated ascites, including adjacent the caudate lobe of the liver.  Musculoskeletal: Probable hemangioma within the lower thoracic spine, eccentric right.  IMPRESSION: 1. Moderate intra and extrahepatic duct dilatation to the level of an abrupt cut off in the porta hepatis. Vague hyper enhancement along an approximately 2 cm area of common duct obliteration. If the patient has a known primary malignancy, this is suspicious for either  ductal or periductal metastasis. Alternatively, given the clinical history of unknown primary, this could represent a primary extrahepatic cholangiocarcinoma. ERCP and/or percutaneous cholangiogram should be considered. These results will be called to the ordering clinician or representative by the Radiologist Assistant, and communication documented in the PACS or zVision Dashboard. 2. 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 3. Cholelithiasis with gallbladder distention. 4. Mildly motion degraded exam. 5. Gastric and bowel distention, likely representing persistent bowel obstruction. The patient may benefit from nasogastric tube placement. 6. Loculated ascites and peritoneal carcinomatosis, as before. 7. Left larger than right pleural effusions.   Electronically Signed   By: KAbigail MiyamotoM.D.   On: 11/02/2014 08:52   Dg Abd 2 Views  11/02/2014   CLINICAL DATA:  Abdominal distention with bowel obstruction  EXAM: ABDOMEN - 2 VIEW  COMPARISON:  November 01, 2014  FINDINGS: Supine and upright images obtained. Drain remains in the lateral right abdomen with postoperative change in the right abdomen. There is small bowel dilatation with multiple air-fluid levels in a pattern concerning  for obstruction. No free air. There is vascular calcification the pelvis.  IMPRESSION: The bowel gas pattern is consistent with a degree of obstruction. No free air. Drain remains in the lateral right abdomen. Overall appearance is similar compared to 1 day prior.   Electronically Signed   By: Lowella Grip III M.D.   On: 11/02/2014 08:44   Dg Abd Acute W/chest  11/01/2014   CLINICAL DATA:  Abdominal pain and distention today. History of recent bowel obstruction an abscess drainage.  EXAM: DG ABDOMEN ACUTE W/ 1V CHEST  COMPARISON:  10/26/2014 radiograph and CT scan 10/24/2014  FINDINGS: The upright chest x-ray demonstrates a stable are poor. There are  small bilateral pleural effusions and overlying atelectasis but no infiltrates or edema. The heart is normal in size. The mediastinal and hilar contours are stable.  Two views of the abdomen demonstrate a stable drainage catheter in the region of the right pericolic gutter. Dilated small bowel loops with air-fluid levels suggesting recurrent small bowel obstruction. Persistent ascites.  IMPRESSION: 1. Small bilateral pleural effusions and bibasilar atelectasis. 2. Suspect recurrent small bowel obstruction and ascites.   Electronically Signed   By: Marijo Sanes M.D.   On: 11/01/2014 10:45   Mr Abd W/wo Cm/mrcp  11/02/2014   CLINICAL DATA:  Abdominal pain and distension. History of recent small bowel obstruction and abscess drainage. Peritoneal carcinomatosis with right hemicolectomy.  EXAM: MRI ABDOMEN WITHOUT AND WITH CONTRAST (INCLUDING MRCP)  TECHNIQUE: Multiplanar multisequence MR imaging of the abdomen was performed both before and after the administration of intravenous contrast. Heavily T2-weighted images of the biliary and pancreatic ducts were obtained, and three-dimensional MRCP images were rendered by post processing.  CONTRAST:  65m MULTIHANCE GADOBENATE DIMEGLUMINE 529 MG/ML IV SOLN  COMPARISON:  CT, most recent 10/24/2014.  FINDINGS: Portions of exam are mildly motion degraded.  Lower chest: Small left pleural effusion. Trace right pleural fluid. Heart size upper normal.  Hepatobiliary: Foci of hepatic hyper enhancement within the dome are nonspecific. Example image 17 of series 11001. There are foci of T2 hyperintensity and hypo enhancement along the hepatic capsule, suspicious for carcinomatosis/peritoneal metastasis.  Gallbladder distension with multiple gallstones. Moderate intrahepatic ductal dilatation, including left hepatic duct at 8 mm on image 25 of series 3. Moderate common duct dilatation at 1.3 cm on image 87 of series 4. The common duct undergoes an abrupt transition in the porta  hepatis. An approximately 2 cm span of narrowed to obliterated duct is identified. The distal most 2.5 cm of duct is normal in caliber at 6 mm on image 114 of series 400. On post-contrast imaging, there is a amorphous soft tissue and vague hyper enhancement along the tract of the narrowed -obliterated duct. Example images 62-65 of series 11002. No well-defined mass. Is felt to be just to the right of the pancreatic head.  Pancreas: Otherwise within normal limits. No pancreatic duct dilatation or acute pancreatitis.  Spleen: Normal  Adrenals/Urinary Tract: Normal adrenal glands. Normal right kidney. 1.4 cm interpolar left renal cyst. No hydronephrosis.  Stomach/Bowel: Gastric distension. Diffuse fluid-filled small bowel loops.  Vascular/Lymphatic: Normal caliber of the aorta and branch vessels. 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 Example image 58 of series 11002. No well-defined retroperitoneal adenopathy.  Other: Ascites, moderate in volume. Areas of loculated ascites, including adjacent the caudate lobe of the liver.  Musculoskeletal: Probable hemangioma within the lower thoracic spine, eccentric right.  IMPRESSION: 1. Moderate intra and extrahepatic duct dilatation  to the level of an abrupt cut off in the porta hepatis. Vague hyper enhancement along an approximately 2 cm area of common duct obliteration. If the patient has a known primary malignancy, this is suspicious for either ductal or periductal metastasis. Alternatively, given the clinical history of unknown primary, this could represent a primary extrahepatic cholangiocarcinoma. ERCP and/or percutaneous cholangiogram should be considered. These results will be called to the ordering clinician or representative by the Radiologist Assistant, and communication documented in the PACS or zVision Dashboard. 2. Cavernous transformation of the portal vein, also likely related to either primary tumor or metastasis in the  porta hepatis. 3. Cholelithiasis with gallbladder distention. 4. Mildly motion degraded exam. 5. Gastric and bowel distention, likely representing persistent bowel obstruction. The patient may benefit from nasogastric tube placement. 6. Loculated ascites and peritoneal carcinomatosis, as before. 7. Left larger than right pleural effusions.   Electronically Signed   By: Abigail Miyamoto M.D.   On: 11/02/2014 08:52    Scheduled Meds: . ciprofloxacin  400 mg Intravenous Q12H  . metronidazole  500 mg Intravenous Q8H  . pantoprazole (PROTONIX) IV  40 mg Intravenous Q12H  . primidone  250 mg Oral q morning - 10a  . propranolol  10 mg Oral BID  . saccharomyces boulardii  250 mg Oral BID  . senna  1 tablet Oral Daily  . sucralfate  1 g Oral TID WC & HS   Continuous Infusions: . 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     [facility] Pager [phone] If 7PM-7AM, please contact night-coverage at [email or web address], password [facility] 11/02/2014, 7:39 PM  LOS: 1 day

## 2014-11-02 NOTE — Care Management Note (Signed)
Case Management Note  Patient Details  Name: Wesley Dillon MRN: [id number] Date of Birth: 08/25/1945  Subjective/Objective:    Pt admitted with recurrent SBO, N, V               Action/Plan:   From home plan to return home.   Expected Discharge Date:                  Expected Discharge Plan:  [place]  In-House Referral:     Discharge planning Services     Post Acute Care Choice:    Choice offered to:     DME Arranged:    DME Agency:     HH Arranged:    [place] Agency:     Status of Service:  In process, will continue to follow  Medicare Important Message Given:    Date Medicare IM Given:    Medicare IM give by:    Date Additional Medicare IM Given:    Additional Medicare Important Message give by:     If discussed at [place] of Stay Meetings, dates discussed:    Additional CommentsPurcell Mouton, RN 11/02/2014, 8:57 PM

## 2014-11-02 NOTE — Progress Notes (Signed)
Initial Nutrition Assessment  DOCUMENTATION CODES:  Severe malnutrition in context of chronic illness  INTERVENTION: - Will monitor for diet advancement and need for supplements versus need for nutrition support - RD will continue to monitor for needs  NUTRITION DIAGNOSIS:  Inadequate oral intake related to inability to eat as evidenced by NPO status.  GOAL:  Patient will meet greater than or equal to 90% of their needs  MONITOR:  Diet advancement, Weight trends, Labs, I & O's  REASON FOR ASSESSMENT:  Malnutrition Screening Tool  ASSESSMENT: 69 year old with recent diagnosis of abdominal carcinomatosis (omental biopsy confirmed the adenocarcinoma 07/01/2014). He presents with increased abdominal pain, nausea and vomiting which started yesterday at approximately noon. He was recently admitted to the hospital for a high-grade bowel obstruction. This resolved nonsurgically and the patient was discharged. He follows with oncology secondary to a history of a recently diagnosed metastatic cancer, unclear primary, metastatic disease to the lungs as well as carcinomatosis of the abdomen.  Pt seen for MST. BMI indicates normal weight status. Severe muscle and fat wasting to upper body; unable to assess lower body as pt is asleep and did not feel it was necessary to arouse him at this time as this RD has seen this pt several times in the past.  No family present. Pt has been NPO since admission. SBO confirmed and no NGT in place at this time. Pt typically has a very good appetite and takes Megace at home as well as drinking Boost.  Unable to meet needs at this time. Medications reviewed. Labs reviewed; Na: 128 mmol/L, Cl: 96 mmol/L, Ca: 8.1 mg/dL.  Height:  Ht Readings from Last 1 Encounters:  11/01/14 6\' 1"  (1.854 m)    Weight:  Wt Readings from Last 1 Encounters:  11/02/14 153 lb 14.1 oz (69.8 kg)    Ideal Body Weight:  83.6 kg (kg)  Wt Readings from Last 10 Encounters:   11/02/14 153 lb 14.1 oz (69.8 kg)  10/27/14 157 lb 14.4 oz (71.623 kg)  10/24/14 164 lb 11.2 oz (74.707 kg)  10/19/14 174 lb 14.4 oz (79.334 kg)  10/13/14 179 lb 9.6 oz (81.466 kg)  10/12/14 174 lb 6.1 oz (79.1 kg)  10/07/14 179 lb 3.7 oz (81.3 kg)  10/05/14 173 lb 6.4 oz (78.654 kg)  09/23/14 170 lb 9.6 oz (77.384 kg)  09/06/14 170 lb 14.4 oz (77.52 kg)    BMI:  Body mass index is 20.31 kg/(m^2).  Estimated Nutritional Needs:  Kcal:  8185-9093  Protein:  75-85 grams  Fluid:  2.2 L/day  Skin:  Reviewed, no issues  Diet Order:     EDUCATION NEEDS:  No education needs identified at this time   Intake/Output Summary (Last 24 hours) at 11/02/14 1323 Last data filed at 11/02/14 1300  Gross per 24 hour  Intake 1507.5 ml  Output    301 ml  Net 1206.5 ml    Last BM:  7/9    Jarome Matin, RD, LDN Inpatient Clinical Dietitian Pager # [phone] After hours/weekend pager # [phone]

## 2014-11-02 NOTE — Progress Notes (Signed)
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 He reports the onset of nausea 10/31/2014. No flatus today. He continues to have nausea and pain.  Objective: Vital signs in last 24 hours: Blood pressure 114/71, pulse 83, temperature 98.2 F (36.8 C), temperature source Oral, resp. rate 18, height 6\' 1"  (1.854 m), weight 153 lb 14.1 oz (69.8 kg), SpO2 98 %.  Intake/Output from previous day: 07/10 0701 - 07/11 0700 In: 1507.5 [I.V.:1207.5; IV Piggyback:300] Out: 400 [Urine:400]  Physical Exam:  HEENT: Scleral icterus Abdomen: Distended, peritoneal drain in the right lower quadrant, erythema at the low abdominal wall  Portacath/PICC-without erythema  Lab Results:  Recent Labs  11/01/14 1715 11/02/14 0345  WBC 8.2 6.9  HGB 9.5* 8.7*  HCT 29.3* 27.3*  PLT 521* 502*    BMET  Recent Labs  11/01/14 1715 11/02/14 0345  NA 130* 128*  K 3.7 3.5  CL 97* 96*  CO2 24 23  GLUCOSE 100* 99  BUN 9 10  CREATININE 0.59* 0.53*  CALCIUM 8.3* 8.1*    Studies/Results: Mr 3d Recon At Scanner  11/02/2014   CLINICAL DATA:  Abdominal pain and distension. History of recent small bowel obstruction and abscess drainage. Peritoneal carcinomatosis with right hemicolectomy.  EXAM: MRI ABDOMEN WITHOUT AND WITH CONTRAST (INCLUDING MRCP)  TECHNIQUE: Multiplanar multisequence MR imaging of the abdomen was performed both before and after the administration of intravenous contrast. Heavily T2-weighted images of the biliary and pancreatic ducts were obtained, and three-dimensional MRCP images were rendered by post processing.  CONTRAST:  43mL MULTIHANCE GADOBENATE DIMEGLUMINE 529 MG/ML IV SOLN  COMPARISON:  CT, most recent 10/24/2014.  FINDINGS: Portions of exam are mildly motion degraded.  Lower chest: Small left pleural effusion. Trace right pleural fluid. Heart size upper normal.  Hepatobiliary: Foci of hepatic hyper enhancement  within the dome are nonspecific. Example image 17 of series 11001. There are foci of T2 hyperintensity and hypo enhancement along the hepatic capsule, suspicious for carcinomatosis/peritoneal metastasis.  Gallbladder distension with multiple gallstones. Moderate intrahepatic ductal dilatation, including left hepatic duct at 8 mm on image 25 of series 3. Moderate common duct dilatation at 1.3 cm on image 87 of series 4. The common duct undergoes an abrupt transition in the porta hepatis. An approximately 2 cm span of narrowed to obliterated duct is identified. The distal most 2.5 cm of duct is normal in caliber at 6 mm on image 114 of series 400. On post-contrast imaging, there is a amorphous soft tissue and vague hyper enhancement along the tract of the narrowed -obliterated duct. Example images 62-65 of series 11002. No well-defined mass. Is felt to be just to the right of the pancreatic head.  Pancreas: Otherwise within normal limits. No pancreatic duct dilatation or acute pancreatitis.  Spleen: Normal  Adrenals/Urinary Tract: Normal adrenal glands. Normal right kidney. 1.4 cm interpolar left renal cyst. No hydronephrosis.  Stomach/Bowel: Gastric distension. Diffuse fluid-filled small bowel loops.  Vascular/Lymphatic: Normal caliber of the aorta and branch vessels. 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 Example image 58 of series 11002. No well-defined retroperitoneal adenopathy.  Other: Ascites, moderate in volume. Areas of loculated ascites, including adjacent the caudate lobe of the liver.  Musculoskeletal: Probable hemangioma within the lower thoracic spine, eccentric right.  IMPRESSION: 1. Moderate intra and extrahepatic duct dilatation to the level of an abrupt cut off in the porta hepatis. Vague hyper enhancement along an approximately  2 cm area of common duct obliteration. If the patient has a known primary malignancy, this is suspicious for either ductal or  periductal metastasis. Alternatively, given the clinical history of unknown primary, this could represent a primary extrahepatic cholangiocarcinoma. ERCP and/or percutaneous cholangiogram should be considered. These results will be called to the ordering clinician or representative by the Radiologist Assistant, and communication documented in the PACS or zVision Dashboard. 2. 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 3. Cholelithiasis with gallbladder distention. 4. Mildly motion degraded exam. 5. Gastric and bowel distention, likely representing persistent bowel obstruction. The patient may benefit from nasogastric tube placement. 6. Loculated ascites and peritoneal carcinomatosis, as before. 7. Left larger than right pleural effusions.   Electronically Signed   By: Abigail Miyamoto M.D.   On: 11/02/2014 08:52   Dg Abd 2 Views  11/02/2014   CLINICAL DATA:  Abdominal distention with bowel obstruction  EXAM: ABDOMEN - 2 VIEW  COMPARISON:  November 01, 2014  FINDINGS: Supine and upright images obtained. Drain remains in the lateral right abdomen with postoperative change in the right abdomen. There is small bowel dilatation with multiple air-fluid levels in a pattern concerning for obstruction. No free air. There is vascular calcification the pelvis.  IMPRESSION: The bowel gas pattern is consistent with a degree of obstruction. No free air. Drain remains in the lateral right abdomen. Overall appearance is similar compared to 1 day prior.   Electronically Signed   By: Lowella Grip III M.D.   On: 11/02/2014 08:44   Dg Abd Acute W/chest  11/01/2014   CLINICAL DATA:  Abdominal pain and distention today. History of recent bowel obstruction an abscess drainage.  EXAM: DG ABDOMEN ACUTE W/ 1V CHEST  COMPARISON:  10/26/2014 radiograph and CT scan 10/24/2014  FINDINGS: The upright chest x-ray demonstrates a stable are poor. There are small  bilateral pleural effusions and overlying atelectasis but no infiltrates or edema. The heart is normal in size. The mediastinal and hilar contours are stable.  Two views of the abdomen demonstrate a stable drainage catheter in the region of the right pericolic gutter. Dilated small bowel loops with air-fluid levels suggesting recurrent small bowel obstruction. Persistent ascites.  IMPRESSION: 1. Small bilateral pleural effusions and bibasilar atelectasis. 2. Suspect recurrent small bowel obstruction and ascites.   Electronically Signed   By: Marijo Sanes M.D.   On: 11/01/2014 10:45   Mr Abd W/wo Cm/mrcp  11/02/2014   CLINICAL DATA:  Abdominal pain and distension. History of recent small bowel obstruction and abscess drainage. Peritoneal carcinomatosis with right hemicolectomy.  EXAM: MRI ABDOMEN WITHOUT AND WITH CONTRAST (INCLUDING MRCP)  TECHNIQUE: Multiplanar multisequence MR imaging of the abdomen was performed both before and after the administration of intravenous contrast. Heavily T2-weighted images of the biliary and pancreatic ducts were obtained, and three-dimensional MRCP images were rendered by post processing.  CONTRAST:  6mL MULTIHANCE GADOBENATE DIMEGLUMINE 529 MG/ML IV SOLN  COMPARISON:  CT, most recent 10/24/2014.  FINDINGS: Portions of exam are mildly motion degraded.  Lower chest: Small left pleural effusion. Trace right pleural fluid. Heart size upper normal.  Hepatobiliary: Foci of hepatic hyper enhancement within the dome are nonspecific. Example image 17 of series 11001. There are foci of T2 hyperintensity and hypo enhancement along the hepatic capsule, suspicious for carcinomatosis/peritoneal metastasis.  Gallbladder distension with multiple gallstones. Moderate intrahepatic ductal dilatation, including left hepatic duct at 8 mm on image  25 of series 3. Moderate common duct dilatation at 1.3 cm on image 87 of series 4. The common duct undergoes an abrupt transition in the porta hepatis.  An approximately 2 cm span of narrowed to obliterated duct is identified. The distal most 2.5 cm of duct is normal in caliber at 6 mm on image 114 of series 400. On post-contrast imaging, there is a amorphous soft tissue and vague hyper enhancement along the tract of the narrowed -obliterated duct. Example images 62-65 of series 11002. No well-defined mass. Is felt to be just to the right of the pancreatic head.  Pancreas: Otherwise within normal limits. No pancreatic duct dilatation or acute pancreatitis.  Spleen: Normal  Adrenals/Urinary Tract: Normal adrenal glands. Normal right kidney. 1.4 cm interpolar left renal cyst. No hydronephrosis.  Stomach/Bowel: Gastric distension. Diffuse fluid-filled small bowel loops.  Vascular/Lymphatic: Normal caliber of the aorta and branch vessels. 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 Example image 58 of series 11002. No well-defined retroperitoneal adenopathy.  Other: Ascites, moderate in volume. Areas of loculated ascites, including adjacent the caudate lobe of the liver.  Musculoskeletal: Probable hemangioma within the lower thoracic spine, eccentric right.  IMPRESSION: 1. Moderate intra and extrahepatic duct dilatation to the level of an abrupt cut off in the porta hepatis. Vague hyper enhancement along an approximately 2 cm area of common duct obliteration. If the patient has a known primary malignancy, this is suspicious for either ductal or periductal metastasis. Alternatively, given the clinical history of unknown primary, this could represent a primary extrahepatic cholangiocarcinoma. ERCP and/or percutaneous cholangiogram should be considered. These results will be called to the ordering clinician or representative by the Radiologist Assistant, and communication documented in the PACS or zVision Dashboard. 2. Cavernous transformation of the portal vein, also likely related to either primary tumor or metastasis in the porta  hepatis. 3. Cholelithiasis with gallbladder distention. 4. Mildly motion degraded exam. 5. Gastric and bowel distention, likely representing persistent bowel obstruction. The patient may benefit from nasogastric tube placement. 6. Loculated ascites and peritoneal carcinomatosis, as before. 7. Left larger than right pleural effusions.   Electronically Signed   By: Abigail Miyamoto M.D.   On: 11/02/2014 08:52    Medications: I have reviewed the patient's current medications.  Assessment/Plan:  1. 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  CT 06/30/2014 consistent with a partial small bowel obstruction and abdominal carcinomatosis  Omentum biopsy 07/01/2014 confirmed adenocarcinoma, nonspecific staining pattern  PET scan 07/07/2014 with multiple areas of hypermetabolic bowel wall thickening consistent with serosal implants from carcinomatosis, thickening and hypermetabolic activity at the terminal ileum felt to potentially represent a primary neoplasm, solitary right liver metastasis, hypermetabolic nodular lung lesions  Cycle 1 FOLFOX 07/15/2014  Cycle 2 FOLFOX 07/28/2014  Cycle 3 FOLFOX 08/11/2014  Cycle 4 FOLFOX 08/25/2014  2. Abdominal pain secondary to #1, improved 3. Iron deficiency, Hemoccult positive stool 4. Partial small bowel obstruction-recurrent bowel obstruction symptoms requiring hospital admission 08/18/2014 5. Bronchiectasis 6. 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 Nausea following cycle 1 FOLFOX, Emend and Aloxi added with cycle 2 8. Neutropenia secondary to chemotherapy-Neulasta will be added with cycle 4 FOLFOX 9. Admission 09/06/2014 with a small bowel obstruction, status post resection of the terminal ileum/right colon and small bowel bypass 09/09/2014  Pathology confirmed diffuse involvement of the small bowel, large bowel, and appendix with adenocarcinoma-no primary tumor site found 10. Anemia  secondary to chronic disease, chemotherapy, and recent  surgery  2 units packed red blood cells 09/23/2014  11. Malnutrition 12. Admission 10/05/2014 with an abdominal abscess, status post percutaneous drainage 13. Frequent loose bowel movements following in the right colectomy and small bowel bypass, improved with Lomotil 14. Bilateral pleural effusions-likely secondary to anasarca 15. Malnutrition 16. Admission 10/24/2014 with a small bowel obstruction-resolved with bowel rest 17. Obstructive jaundice-rising alkaline phosphatase/bilirubin with CT 10/20/2014 confirming a dilated common bile duct with intrahepatic duct dilatation  MRCP 11/01/2014 reveals obstruction of the common bile duct  18. 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 This is confirmed on plain x-ray and MRI. He is being followed by the surgical service. I doubt he will be a candidate for exploratory surgery. A palliative gastrostomy tube may be indicated if the obstructive symptoms do not resolve with bowel rest.  The MRCP reveals obstruction of the common bile duct. This may be due to extrinsic tumor or a primary cholangiocarcinoma.  His overall prognosis is poor with the metastatic carcinoma and multiple complications. He was evaluated by the [facility] program palliative care service at home.  Recommendations: 1. Management of bowel structure  per the surgical service 2. Consult GI for an ERCP and stent placement as indicated 3. 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    LOS: 1 day   Nicholi Ghuman, Dominica Severin  11/02/2014, 2:23 PM

## 2014-11-03 ENCOUNTER — Inpatient Hospital Stay (HOSPITAL_COMMUNITY): Payer: PPO

## 2014-11-03 ENCOUNTER — Ambulatory Visit: Payer: PPO | Admitting: Oncology

## 2014-11-03 ENCOUNTER — Other Ambulatory Visit: Payer: PPO

## 2014-11-03 DIAGNOSIS — C801 Malignant (primary) neoplasm, unspecified: Secondary | ICD-10-CM

## 2014-11-03 DIAGNOSIS — R180 Malignant ascites: Secondary | ICD-10-CM | POA: Insufficient documentation

## 2014-11-03 DIAGNOSIS — K831 Obstruction of bile duct: Secondary | ICD-10-CM

## 2014-11-03 LAB — HEPATIC FUNCTION PANEL
ALBUMIN: 1.9 g/dL — AB (ref 3.5–5.0)
ALT: 32 U/L (ref 17–63)
AST: 77 U/L — AB (ref 15–41)
Alkaline Phosphatase: 1579 U/L — ABNORMAL HIGH (ref 38–126)
BILIRUBIN INDIRECT: 2.5 mg/dL — AB (ref 0.3–0.9)
BILIRUBIN TOTAL: 6.4 mg/dL — AB (ref 0.3–1.2)
Bilirubin, Direct: 3.9 mg/dL — ABNORMAL HIGH (ref 0.1–0.5)
TOTAL PROTEIN: 6.5 g/dL (ref 6.5–8.1)

## 2014-11-03 LAB — GLUCOSE, CAPILLARY: GLUCOSE-CAPILLARY: 102 mg/dL — AB (ref 65–99)

## 2014-11-03 MED ORDER — BISACODYL 10 MG RE SUPP
10.0000 mg | Freq: Two times a day (BID) | RECTAL | Status: DC
  Administered 2014-11-03 – 2014-11-05 (×3): 10 mg via RECTAL
  Filled 2014-11-03 (×5): qty 1

## 2014-11-03 NOTE — Progress Notes (Signed)
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   Assessment/Plan: 1. Recurrent SBO: Not improving, NG tube placed and connected to low intermittent suction. 400 ml in the last 36 hours.  NPO and gentle hydration.  Surgery consulted and recommendations given.   2. Abdominal carcinomatosis with unknown primary:  - s/p 4 cycles of chemo, followed by expl lap in 08/2014.  Further management as per oncology.    3. Elevated alk phos and elevated bilirubin:  MRI showed intra and extra hepatobiliary ductal dilatation.  Requested GI consult and plan for ERCP when SBO improves.   4. SEVERE Protein calorie malnutrition: - nutrition consulted.   Hyponatremia: persistent. Initially thought to be sec to dehdyration, not improving with hydration.  Repeat  Labs in am.   Code Status: FULL CODE  Family Communication: none at bedside.  Disposition Plan: remain inpatient.    Consultants:  Oncology  Surgery  GI  Procedures:  MRI ABDOMIN  Antibiotics:  NONE  HPI/Subjective: One small BM today, walking in the hallway, feel  A little better today.   Objective: Filed Vitals:   11/03/14 1433  BP: 106/66  Pulse: 70  Temp: 97.9 F (36.6 C)  Resp: 18    Intake/Output Summary (Last 24 hours) at 11/03/14 1928 Last data filed at 11/03/14 1821  Gross per 24 hour  Intake 1748.75 ml  Output   1280 ml  Net 468.75 ml   Filed Weights   11/01/14 1700 11/02/14 0355 11/03/14 0535  Weight: 69.1 kg (152 lb 5.4 oz) 69.8 kg (153 lb 14.1 oz) 64.8 kg (142 lb 13.7 oz)    Exam:   General:  Alert  Comfortable.   Cardiovascular: s1s2  Respiratory: diminished breath sounds at bases.   Abdomen: firm, distended bowel sounds feeble  Musculoskeletal: no pedal edema , cyanosis or  clubbing.   Data Reviewed: Basic Metabolic Panel:  Recent Labs Lab 11/01/14 0847 11/01/14 1715 11/02/14 0345  NA 129* 130* 128*  K 3.6 3.7 3.5  CL 95* 97* 96*  CO2 23 24 23   GLUCOSE 108* 100* 99  BUN 9 9 10   CREATININE 0.47* 0.59* 0.53*  CALCIUM 8.3* 8.3* 8.1*  MG  --  1.4*  --   PHOS  --  3.5  --    Liver Function Tests:  Recent Labs Lab 11/01/14 0847 11/01/14 1715 11/03/14 1307  AST 79* 73* 77*  ALT 32 31 32  ALKPHOS 1446* 1298* 1579*  BILITOT 5.0* 5.3* 6.4*  PROT 7.2 7.0 6.5  ALBUMIN 2.1* 2.1* 1.9*   No results for input(s): LIPASE, AMYLASE in the last 168 hours. No results for input(s): AMMONIA in the last 168 hours. CBC:  Recent Labs Lab 11/01/14 0847 11/01/14 1715 11/02/14 0345  WBC 8.2 8.2 6.9  NEUTROABS 5.8 5.4  --   HGB 9.8* 9.5* 8.7*  HCT 30.4* 29.3* 27.3*  MCV 88.6 89.1 90.4  PLT 565* 521* 502*   Cardiac Enzymes: No results for input(s): CKTOTAL, CKMB, CKMBINDEX, TROPONINI in the last 168 hours. BNP (last 3 results)  Recent Labs  10/06/14 0545 10/11/14 0802  BNP 574.2* 223.3*    ProBNP (last 3 results) No results for input(s): PROBNP in the last 8760 hours.  CBG:  Recent Labs Lab 11/03/14 0749  GLUCAP 102*    No results found for this or any  previous visit (from the past 240 hour(s)).   Studies: Dg Abd 2 Views  11/02/2014   CLINICAL DATA:  Abdominal distention with bowel obstruction  EXAM: ABDOMEN - 2 VIEW  COMPARISON:  November 01, 2014  FINDINGS: Supine and upright images obtained. Drain remains in the lateral right abdomen with postoperative change in the right abdomen. There is small bowel dilatation with multiple air-fluid levels in a pattern concerning for obstruction. No free air. There is vascular calcification the pelvis.  IMPRESSION: The bowel gas pattern is consistent with a degree of obstruction. No free air. Drain remains in the lateral right abdomen. Overall appearance is similar compared to 1 day prior.    Electronically Signed   By: Lowella Grip III M.D.   On: 11/02/2014 08:44    Scheduled Meds: . bisacodyl  10 mg Rectal BID  . ciprofloxacin  400 mg Intravenous Q12H  . metronidazole  500 mg Intravenous Q8H  . pantoprazole (PROTONIX) IV  40 mg Intravenous Q12H  . primidone  250 mg Oral q morning - 10a  . propranolol  10 mg Oral BID  . saccharomyces boulardii  250 mg Oral BID  . senna  1 tablet Oral Daily  . sucralfate  1 g Oral TID WC & HS   Continuous Infusions: . sodium chloride 75 mL/hr at 11/02/14 1725    Active Problems:   SBO- recurrent, s/p surgery May 2016   Malignant ascites   Biliary obstruction due to malignant neoplasm    Time spent: 35 minutes.     [facility] Pager [phone] If 7PM-7AM, please contact night-coverage at [email or web address], password [facility] 11/03/2014, 7:28 PM  LOS: 2 days

## 2014-11-03 NOTE — Procedures (Signed)
Right abdominal drain was injected with contrast.  No abscess cavity and no fistula.  The drain was removed.

## 2014-11-03 NOTE — Progress Notes (Addendum)
[facility]  Patient Status: Active (receiving services up to time of hospitalization)  [facility] is providing the following services: PT  If patient discharges after hours, please call [phone].   Wesley Dillon 11/03/2014, 9:05 AM

## 2014-11-03 NOTE — Progress Notes (Signed)
    Progress Note   Subjective  Abdominal pain improving, no pain meds since yesterday at 2pm. Still no flatus.     Objective   Vital signs in last 24 hours: Temp:  [97.5 F (36.4 C)-98.3 F (36.8 C)] 97.5 F (36.4 C) (07/12 0535) Pulse Rate:  [80-82] 80 (07/11 2103) Resp:  [18-28] 28 (07/12 0535) BP: (110-114)/(70-72) 113/72 mmHg (07/12 0535) SpO2:  [96 %-99 %] 98 % (07/12 0535) Weight:  [142 lb 13.7 oz (64.8 kg)] 142 lb 13.7 oz (64.8 kg) (07/12 0535) Last BM Date: 11/02/14 (small BM) General:    white male in NAD Abdomen:  Soft, nontender, moderately distended but not particularly tympanitic Extremities:  Without edema. Neurologic:  Alert and oriented,  grossly normal neurologically. Psych:  Cooperative. Normal mood and affect.  Intake/Output from previous day: 07/11 0701 - 07/12 0700 In: 1973.8 [I.V.:1543.8; NG/GT:30; IV Piggyback:400] Out: 10 [Urine:680; Emesis/NG output:200; Stool:1] Intake/Output this shift: Total I/O In: 200 [IV Piggyback:200] Out: 100 [Urine:100]    Assessment / Plan:    60. 69 year old male with carcinomatosis thought to be small bowel primary, s/p distal small bowel / right colectomy. Path c/w adenocarcinoma.  Now admitted with recurrent SBO, CTscan now showing moderate intra and extrahepatic duct dilatation to the level of an abrupt cut off in the porta hepatis. This could be extrinsiic compression vrs cholangiocarcinoma.  He is quite cholestatic. Needs biliary decompression, preferably with ERCP. If SBO doesn't improve then percutaneous drain another option.     No flatus but little NGT tube output (200 ml since inserted yesterday). He wants to try another suppository today which is reasonable to try and empty anything out distal to the obstruction. I think much of his abdominal distention is from ascites. Will repeat KUB in am.     LOS: 2 days   Wesley Dillon  11/03/2014, 9:25 AM

## 2014-11-03 NOTE — Progress Notes (Signed)
Patient ID: Wesley Dillon, male   DOB: May 16, 1945, 69 y.o.   MRN: [id number]     [name]      [address] [place], [place] [address]    Phone: [phone] FAX: [phone]     Subjective: Small BM after suppository. No BM.  NGT placed.  No pain.  Nausea.    Objective:  Vital signs:  Filed Vitals:   11/02/14 0355 11/02/14 1422 11/02/14 2103 11/03/14 0535  BP: 114/71 110/70 114/71 113/72  Pulse: 83 82 80   Temp: 98.2 F (36.8 C) 98 F (36.7 C) 98.3 F (36.8 C) 97.5 F (36.4 C)  TempSrc: Oral Oral Oral Oral  Resp: _0 Height:      Weight: 69.8 kg (153 lb 14.1 oz)   64.8 kg (142 lb 13.7 oz)  SpO2: 98% 96% 99% 98%    Last BM Date: 11/02/14 (small BM)  Intake/Output   Yesterday:  07/11 0701 - 07/12 0700 In: 1973.8 [I.V.:1543.8; NG/GT:30; IV Piggyback:400] Out: 881 [Urine:680; Emesis/NG output:200; Stool:1] This shift:  Total I/O In: 200 [IV Piggyback:200] Out: 100 [Urine:100]   Physical Exam: General: Pt awake/alert/oriented x4 in no acute distress Abdomen: +BS, distended, non tender. RUQ drain.    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    Results:   Labs: Results for orders placed or performed during the hospital encounter of 11/01/14 (from the past 48 hour(s))  Comprehensive metabolic panel     Status: Abnormal   Collection Time: 11/01/14  5:15 PM  Result Value Ref Range   Sodium 130 (L) 135 - 145 mmol/L   Potassium 3.7 3.5 - 5.1 mmol/L   Chloride 97 (L) 101 - 111 mmol/L   CO2 24 22 - 32 mmol/L   Glucose, Bld 100 (H) 65 - 99 mg/dL   BUN 9 6 - 20 mg/dL   Creatinine, Ser 0.59 (L) 0.61 - 1.24 mg/dL   Calcium 8.3 (L) 8.9 - 10.3 mg/dL   Total Protein 7.0 6.5 - 8.1 g/dL   Albumin 2.1 (L) 3.5 - 5.0 g/dL   AST 73 (H) 15 - 41 U/L   ALT 31 17 - 63 U/L   Alkaline Phosphatase 1298 (H) 38 - 126 U/L   Total Bilirubin 5.3 (H) 0.3 - 1.2 mg/dL   GFR calc non Af  Amer >60 >60 mL/min   GFR calc Af Amer >60 >60 mL/min    Comment: (NOTE) The eGFR has been calculated using the CKD EPI equation. This calculation has not been validated in all clinical situations. eGFR's persistently <60 mL/min signify possible Chronic Kidney Disease.    Anion gap 9 5 - 15  Magnesium     Status: Abnormal   Collection Time: 11/01/14  5:15 PM  Result Value Ref Range   Magnesium 1.4 (L) 1.7 - 2.4 mg/dL  Phosphorus     Status: None   Collection Time: 11/01/14  5:15 PM  Result Value Ref Range   Phosphorus 3.5 2.5 - 4.6 mg/dL  CBC WITH DIFFERENTIAL     Status: Abnormal   Collection Time: 11/01/14  5:15 PM  Result Value Ref Range   WBC 8.2 4.0 - 10.5 K/uL   RBC 3.29 (L) 4.22 - 5.81 MIL/uL   Hemoglobin 9.5 (L) 13.0 - 17.0 g/dL   HCT 29.3 (L) 39.0 - 52.0 %   MCV 89.1 78.0 - 100.0 fL   MCH  28.9 26.0 - 34.0 pg   MCHC 32.4 30.0 - 36.0 g/dL   RDW 24.6 (H) 11.5 - 15.5 %   Platelets 521 (H) 150 - 400 K/uL   Neutrophils Relative % 66 43 - 77 %   Lymphocytes Relative 20 12 - 46 %   Monocytes Relative 12 3 - 12 %   Eosinophils Relative 1 0 - 5 %   Basophils Relative 1 0 - 1 %   Neutro Abs 5.4 1.7 - 7.7 K/uL   Lymphs Abs 1.6 0.7 - 4.0 K/uL   Monocytes Absolute 1.0 0.1 - 1.0 K/uL   Eosinophils Absolute 0.1 0.0 - 0.7 K/uL   Basophils Absolute 0.1 0.0 - 0.1 K/uL   RBC Morphology POLYCHROMASIA PRESENT     Comment: TARGET CELLS   WBC Morphology MILD LEFT SHIFT (1-5% METAS, OCC MYELO, OCC BANDS)   APTT     Status: None   Collection Time: 11/01/14  5:15 PM  Result Value Ref Range   aPTT 36 24 - 37 seconds  Protime-INR     Status: Abnormal   Collection Time: 11/01/14  5:15 PM  Result Value Ref Range   Prothrombin Time 15.8 (H) 11.6 - 15.2 seconds   INR 1.24 0.00 - 1.49  TSH     Status: Abnormal   Collection Time: 11/01/14  5:15 PM  Result Value Ref Range   TSH 7.198 (H) 0.350 - 4.500 uIU/mL  Basic metabolic panel     Status: Abnormal   Collection Time: 11/14/14  3:45 AM   Result Value Ref Range   Sodium 128 (L) 135 - 145 mmol/L   Potassium 3.5 3.5 - 5.1 mmol/L   Chloride 96 (L) 101 - 111 mmol/L   CO2 23 22 - 32 mmol/L   Glucose, Bld 99 65 - 99 mg/dL   BUN 10 6 - 20 mg/dL   Creatinine, Ser 0.53 (L) 0.61 - 1.24 mg/dL   Calcium 8.1 (L) 8.9 - 10.3 mg/dL   GFR calc non Af Amer >60 >60 mL/min   GFR calc Af Amer >60 >60 mL/min    Comment: (NOTE) The eGFR has been calculated using the CKD EPI equation. This calculation has not been validated in all clinical situations. eGFR's persistently <60 mL/min signify possible Chronic Kidney Disease.    Anion gap 9 5 - 15  CBC     Status: Abnormal   Collection Time: 11-14-2014  3:45 AM  Result Value Ref Range   WBC 6.9 4.0 - 10.5 K/uL   RBC 3.02 (L) 4.22 - 5.81 MIL/uL   Hemoglobin 8.7 (L) 13.0 - 17.0 g/dL   HCT 27.3 (L) 39.0 - 52.0 %   MCV 90.4 78.0 - 100.0 fL   MCH 28.8 26.0 - 34.0 pg   MCHC 31.9 30.0 - 36.0 g/dL   RDW 24.8 (H) 11.5 - 15.5 %   Platelets 502 (H) 150 - 400 K/uL  Glucose, capillary     Status: Abnormal   Collection Time: 11/03/14  7:49 AM  Result Value Ref Range   Glucose-Capillary 102 (H) 65 - 99 mg/dL    Imaging / Studies: Mr 3d Recon At Scanner  2014/11/14   CLINICAL DATA:  Abdominal pain and distension. History of recent small bowel obstruction and abscess drainage. Peritoneal carcinomatosis with right hemicolectomy.  EXAM: MRI ABDOMEN WITHOUT AND WITH CONTRAST (INCLUDING MRCP)  TECHNIQUE: Multiplanar multisequence MR imaging of the abdomen was performed both before and after the administration of intravenous contrast. Heavily T2-weighted images  of the biliary and pancreatic ducts were obtained, and three-dimensional MRCP images were rendered by post processing.  CONTRAST:  59m MULTIHANCE GADOBENATE DIMEGLUMINE 529 MG/ML IV SOLN  COMPARISON:  CT, most recent 10/24/2014.  FINDINGS: Portions of exam are mildly motion degraded.  Lower chest: Small left pleural effusion. Trace right pleural fluid.  Heart size upper normal.  Hepatobiliary: Foci of hepatic hyper enhancement within the dome are nonspecific. Example image 17 of series 11001. There are foci of T2 hyperintensity and hypo enhancement along the hepatic capsule, suspicious for carcinomatosis/peritoneal metastasis.  Gallbladder distension with multiple gallstones. Moderate intrahepatic ductal dilatation, including left hepatic duct at 8 mm on image 25 of series 3. Moderate common duct dilatation at 1.3 cm on image 87 of series 4. The common duct undergoes an abrupt transition in the porta hepatis. An approximately 2 cm span of narrowed to obliterated duct is identified. The distal most 2.5 cm of duct is normal in caliber at 6 mm on image 114 of series 400. On post-contrast imaging, there is a amorphous soft tissue and vague hyper enhancement along the tract of the narrowed -obliterated duct. Example images 62-65 of series 11002. No well-defined mass. Is felt to be just to the right of the pancreatic head.  Pancreas: Otherwise within normal limits. No pancreatic duct dilatation or acute pancreatitis.  Spleen: Normal  Adrenals/Urinary Tract: Normal adrenal glands. Normal right kidney. 1.4 cm interpolar left renal cyst. No hydronephrosis.  Stomach/Bowel: Gastric distension. Diffuse fluid-filled small bowel loops.  Vascular/Lymphatic: Normal caliber of the aorta and branch vessels. 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 Example image 58 of series 11002. No well-defined retroperitoneal adenopathy.  Other: Ascites, moderate in volume. Areas of loculated ascites, including adjacent the caudate lobe of the liver.  Musculoskeletal: Probable hemangioma within the lower thoracic spine, eccentric right.  IMPRESSION: 1. Moderate intra and extrahepatic duct dilatation to the level of an abrupt cut off in the porta hepatis. Vague hyper enhancement along an approximately 2 cm area of common duct obliteration. If the patient  has a known primary malignancy, this is suspicious for either ductal or periductal metastasis. Alternatively, given the clinical history of unknown primary, this could represent a primary extrahepatic cholangiocarcinoma. ERCP and/or percutaneous cholangiogram should be considered. These results will be called to the ordering clinician or representative by the Radiologist Assistant, and communication documented in the PACS or zVision Dashboard. 2. 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 3. Cholelithiasis with gallbladder distention. 4. Mildly motion degraded exam. 5. Gastric and bowel distention, likely representing persistent bowel obstruction. The patient may benefit from nasogastric tube placement. 6. Loculated ascites and peritoneal carcinomatosis, as before. 7. Left larger than right pleural effusions.   Electronically Signed   By: KAbigail MiyamotoM.D.   On: 11/02/2014 08:52   Dg Abd 2 Views  11/02/2014   CLINICAL DATA:  Abdominal distention with bowel obstruction  EXAM: ABDOMEN - 2 VIEW  COMPARISON:  November 01, 2014  FINDINGS: Supine and upright images obtained. Drain remains in the lateral right abdomen with postoperative change in the right abdomen. There is small bowel dilatation with multiple air-fluid levels in a pattern concerning for obstruction. No free air. There is vascular calcification the pelvis.  IMPRESSION: The bowel gas pattern is consistent with a degree of obstruction. No free air. Drain remains in the lateral right abdomen. Overall appearance is similar compared to 1  day prior.   Electronically Signed   By: Lowella Grip III M.D.   On: 11/02/2014 08:44   Dg Abd Acute W/chest  11/01/2014   CLINICAL DATA:  Abdominal pain and distention today. History of recent bowel obstruction an abscess drainage.  EXAM: DG ABDOMEN ACUTE W/ 1V CHEST  COMPARISON:  10/26/2014 radiograph and CT scan 10/24/2014  FINDINGS: The  upright chest x-ray demonstrates a stable are poor. There are small bilateral pleural effusions and overlying atelectasis but no infiltrates or edema. The heart is normal in size. The mediastinal and hilar contours are stable.  Two views of the abdomen demonstrate a stable drainage catheter in the region of the right pericolic gutter. Dilated small bowel loops with air-fluid levels suggesting recurrent small bowel obstruction. Persistent ascites.  IMPRESSION: 1. Small bilateral pleural effusions and bibasilar atelectasis. 2. Suspect recurrent small bowel obstruction and ascites.   Electronically Signed   By: Marijo Sanes M.D.   On: 11/01/2014 10:45   Mr Abd W/wo Cm/mrcp  11/02/2014   CLINICAL DATA:  Abdominal pain and distension. History of recent small bowel obstruction and abscess drainage. Peritoneal carcinomatosis with right hemicolectomy.  EXAM: MRI ABDOMEN WITHOUT AND WITH CONTRAST (INCLUDING MRCP)  TECHNIQUE: Multiplanar multisequence MR imaging of the abdomen was performed both before and after the administration of intravenous contrast. Heavily T2-weighted images of the biliary and pancreatic ducts were obtained, and three-dimensional MRCP images were rendered by post processing.  CONTRAST:  28m MULTIHANCE GADOBENATE DIMEGLUMINE 529 MG/ML IV SOLN  COMPARISON:  CT, most recent 10/24/2014.  FINDINGS: Portions of exam are mildly motion degraded.  Lower chest: Small left pleural effusion. Trace right pleural fluid. Heart size upper normal.  Hepatobiliary: Foci of hepatic hyper enhancement within the dome are nonspecific. Example image 17 of series 11001. There are foci of T2 hyperintensity and hypo enhancement along the hepatic capsule, suspicious for carcinomatosis/peritoneal metastasis.  Gallbladder distension with multiple gallstones. Moderate intrahepatic ductal dilatation, including left hepatic duct at 8 mm on image 25 of series 3. Moderate common duct dilatation at 1.3 cm on image 87 of series 4.  The common duct undergoes an abrupt transition in the porta hepatis. An approximately 2 cm span of narrowed to obliterated duct is identified. The distal most 2.5 cm of duct is normal in caliber at 6 mm on image 114 of series 400. On post-contrast imaging, there is a amorphous soft tissue and vague hyper enhancement along the tract of the narrowed -obliterated duct. Example images 62-65 of series 11002. No well-defined mass. Is felt to be just to the right of the pancreatic head.  Pancreas: Otherwise within normal limits. No pancreatic duct dilatation or acute pancreatitis.  Spleen: Normal  Adrenals/Urinary Tract: Normal adrenal glands. Normal right kidney. 1.4 cm interpolar left renal cyst. No hydronephrosis.  Stomach/Bowel: Gastric distension. Diffuse fluid-filled small bowel loops.  Vascular/Lymphatic: Normal caliber of the aorta and branch vessels. 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 Example image 58 of series 11002. No well-defined retroperitoneal adenopathy.  Other: Ascites, moderate in volume. Areas of loculated ascites, including adjacent the caudate lobe of the liver.  Musculoskeletal: Probable hemangioma within the lower thoracic spine, eccentric right.  IMPRESSION: 1. Moderate intra and extrahepatic duct dilatation to the level of an abrupt cut off in the porta hepatis. Vague hyper enhancement along an approximately 2 cm area of common duct obliteration. If the patient has a known primary malignancy, this is suspicious for either ductal or periductal  metastasis. Alternatively, given the clinical history of unknown primary, this could represent a primary extrahepatic cholangiocarcinoma. ERCP and/or percutaneous cholangiogram should be considered. These results will be called to the ordering clinician or representative by the Radiologist Assistant, and communication documented in the PACS or zVision Dashboard. 2. Cavernous transformation of the portal vein, also  likely related to either primary tumor or metastasis in the porta hepatis. 3. Cholelithiasis with gallbladder distention. 4. Mildly motion degraded exam. 5. Gastric and bowel distention, likely representing persistent bowel obstruction. The patient may benefit from nasogastric tube placement. 6. Loculated ascites and peritoneal carcinomatosis, as before. 7. Left larger than right pleural effusions.   Electronically Signed   By: Abigail Miyamoto M.D.   On: 11/02/2014 08:52    Medications / Allergies:  Scheduled Meds: . ciprofloxacin  400 mg Intravenous Q12H  . metronidazole  500 mg Intravenous Q8H  . pantoprazole (PROTONIX) IV  40 mg Intravenous Q12H  . primidone  250 mg Oral q morning - 10a  . propranolol  10 mg Oral BID  . saccharomyces boulardii  250 mg Oral BID  . senna  1 tablet Oral Daily  . sucralfate  1 g Oral TID WC & HS   Continuous Infusions: . sodium chloride 75 mL/hr at 11/02/14 1725   PRN Meds:.acetaminophen **OR** acetaminophen, HYDROmorphone (DILAUDID) injection, ondansetron **OR** ondansetron (ZOFRAN) IV, sodium chloride  Antibiotics: Anti-infectives    Start     Dose/Rate Route Frequency Ordered Stop   11/01/14 1800  ciprofloxacin (CIPRO) IVPB 400 mg     400 mg 200 mL/hr over 60 Minutes Intravenous Every 12 hours 11/01/14 1707     11/01/14 1800  metroNIDAZOLE (FLAGYL) IVPB 500 mg     500 mg 100 mL/hr over 60 Minutes Intravenous Every 8 hours 11/01/14 1707          Assessment/Plan Recurrent small bowel obstruction Abdominal carcinomatosis  -not much improvement.  217m NGT output, looks feculent. Unfortunately, he is not a surgical candidate if he fails to improve. -mobilize, IV hydration, pain control and ant-emetics. 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  GI on board.  Plan for ERCP once SBO improves.   Hx exploratory laparotomy resection TI and Rt colon, small bowel bypass complicated by post op abscess---Dr. HExcell Seltzer5/18/16--on cipro/flagyl and drain.  Pt wants  drain out.  Will d/w Dr. MHassell Doneif okay to call IR for drain injection.     EErby Pian [facility] Pager [phone]([id number])   11/03/2014 9:32 AM

## 2014-11-04 ENCOUNTER — Ambulatory Visit: Payer: 59

## 2014-11-04 ENCOUNTER — Other Ambulatory Visit: Payer: PPO

## 2014-11-04 ENCOUNTER — Other Ambulatory Visit: Payer: 59

## 2014-11-04 ENCOUNTER — Inpatient Hospital Stay (HOSPITAL_COMMUNITY): Payer: PPO

## 2014-11-04 DIAGNOSIS — R180 Malignant ascites: Secondary | ICD-10-CM

## 2014-11-04 DIAGNOSIS — K5669 Other intestinal obstruction: Secondary | ICD-10-CM

## 2014-11-04 DIAGNOSIS — K831 Obstruction of bile duct: Secondary | ICD-10-CM

## 2014-11-04 DIAGNOSIS — C801 Malignant (primary) neoplasm, unspecified: Secondary | ICD-10-CM

## 2014-11-04 DIAGNOSIS — C786 Secondary malignant neoplasm of retroperitoneum and peritoneum: Secondary | ICD-10-CM | POA: Insufficient documentation

## 2014-11-04 LAB — CBC
HEMATOCRIT: 27.1 % — AB (ref 39.0–52.0)
HEMOGLOBIN: 8.8 g/dL — AB (ref 13.0–17.0)
MCH: 28.9 pg (ref 26.0–34.0)
MCHC: 32.5 g/dL (ref 30.0–36.0)
MCV: 89.1 fL (ref 78.0–100.0)
Platelets: 453 10*3/uL — ABNORMAL HIGH (ref 150–400)
RBC: 3.04 MIL/uL — ABNORMAL LOW (ref 4.22–5.81)
RDW: 24.2 % — ABNORMAL HIGH (ref 11.5–15.5)
WBC: 6.2 10*3/uL (ref 4.0–10.5)

## 2014-11-04 LAB — COMPREHENSIVE METABOLIC PANEL
ALK PHOS: 1537 U/L — AB (ref 38–126)
ALT: 29 U/L (ref 17–63)
AST: 76 U/L — AB (ref 15–41)
Albumin: 1.9 g/dL — ABNORMAL LOW (ref 3.5–5.0)
Anion gap: 11 (ref 5–15)
BUN: 7 mg/dL (ref 6–20)
CO2: 21 mmol/L — AB (ref 22–32)
Calcium: 7.9 mg/dL — ABNORMAL LOW (ref 8.9–10.3)
Chloride: 99 mmol/L — ABNORMAL LOW (ref 101–111)
Creatinine, Ser: 0.45 mg/dL — ABNORMAL LOW (ref 0.61–1.24)
GFR calc Af Amer: >60 mL/min (ref >60)
GFR calc non Af Amer: >60 mL/min (ref >60)
Glucose, Bld: 87 mg/dL (ref 65–99)
POTASSIUM: 3.4 mmol/L — AB (ref 3.5–5.1)
Sodium: 131 mmol/L — ABNORMAL LOW (ref 135–145)
Total Bilirubin: 6.5 mg/dL — ABNORMAL HIGH (ref 0.3–1.2)
Total Protein: 6.3 g/dL — ABNORMAL LOW (ref 6.5–8.1)

## 2014-11-04 LAB — GLUCOSE, CAPILLARY: Glucose-Capillary: 90 mg/dL (ref 65–99)

## 2014-11-04 NOTE — Progress Notes (Signed)
Progress Note   Subjective  passed flatus  X 1. NGT is clamped, tolerated some clears.    Objective   Vital signs in last 24 hours: Temp:  [97.5 F (36.4 C)-98.4 F (36.9 C)] 98.4 F (36.9 C) (07/13 0545) Pulse Rate:  [70-81] 81 (07/13 0545) Resp:  [18-19] 19 (07/13 0545) BP: (106-111)/(64-72) 110/72 mmHg (07/13 0545) SpO2:  [97 %-100 %] 99 % (07/13 0545) Last BM Date: 11/03/14 General:    Pleasant white male in NAD Abdomen:  Soft, nontender, moderately distended but less than yesterday.  Extremities:  Without edema. Neurologic:  Alert and oriented,  grossly normal neurologically. Psych:  Cooperative. Normal mood and affect.  Intake/Output from previous day: 07/12 0701 - 07/13 0700 In: 2352.5 [P.O.:220; I.V.:1432.5; IV Piggyback:700] Out: 1400 [Urine:800; Emesis/NG output:600] Intake/Output this shift:    Lab Results:  Recent Labs  11/01/14 1715 11/02/14 0345 11/04/14 0500  WBC 8.2 6.9 6.2  HGB 9.5* 8.7* 8.8*  HCT 29.3* 27.3* 27.1*  PLT 521* 502* 453*   BMET  Recent Labs  11/01/14 1715 11/02/14 0345 11/04/14 0500  NA 130* 128* 131*  K 3.7 3.5 3.4*  CL 97* 96* 99*  CO2 24 23 21*  GLUCOSE 100* 99 87  BUN 9 10 7   CREATININE 0.59* 0.53* 0.45*  CALCIUM 8.3* 8.1* 7.9*   LFT  Recent Labs  11/03/14 1307 11/04/14 0500  PROT 6.5 6.3*  ALBUMIN 1.9* 1.9*  AST 77* 76*  ALT 32 29  ALKPHOS 1579* 1537*  BILITOT 6.4* 6.5*  BILIDIR 3.9*  --   IBILI 2.5*  --    PT/INR  Recent Labs  11/01/14 1715  LABPROT 15.8*  INR 1.24    Studies/Results: Dg Abd 1 View  11/04/2014   CLINICAL DATA:  Follow-up of small bowel obstruction, clinically improved but persistent abdominal distention  EXAM: ABDOMEN - 1 VIEW  COMPARISON:  Abdominal film of November 02, 2014  FINDINGS: The pigtail catheter on the right has been removed. There remain loops of mildly distended gas-filled small bowel in the mid abdomen. There is gas in the pelvis likely in the rectum. The  esophagogastric tube's proximal port lies at or just below the GE junction. The tip lies in the gastric cardia. The bony structures are unremarkable. There are no abnormal soft tissue calcifications.  IMPRESSION: 1. Persistent distention of small-bowel loops in the mid abdomen consistent with a mid to distal small bowel obstruction. 2. Advancement of the nasogastric tube by 10 cm is recommended to assure that the proximal port lies below the GE junction.   Electronically Signed   By: Harald  Martinique M.D.   On: 11/04/2014 09:12     Assessment / Plan:    Carcinomatosis with bowel and biliary obstruction. Now admitted with recurrent SBO. Still with SBO on today's film but there is gas in rectum and he did pass some flatus (x1).  Cholestasis slowly progressive, needs biliary decompression, preferably with ERCP.   If SBO doesn't resolve soon then may need IR to place percutaneous drain (? and stent).  No signs of cholangitis. Not going to start antibiotics for prophylaxis.   Surgery following but doesn't sound like he is candidate for anymore surgeries for bowel obstruction.   His nutritional status will need to be addressed, ? Temporary TNA.    Patient mentioned gastrostomy tube for decompression if obstruction doesn't resolve. A venting PEG is certainly an option but wonder about oozing from site given ascites. Also, not sure  we place endoscopically.  Wrote nursing order to advance NG tube based on today's KUB     LOS: 3 days   Tye Savoy  11/04/2014, 10:29 AM

## 2014-11-04 NOTE — Progress Notes (Signed)
Patient ID: Wesley Dillon, male   DOB: 02/20/1946, 69 y.o.   MRN: [id number]     [facility]      [address]   [place], [place] [address]    Phone: [phone] FAX: [phone]     Subjective: Had nausea at 11AM yesterday, none since NGT has been clamped.  Tolerating clears.  No flatus.  BM after suppository yesterday.    Objective:  Vital signs:  Filed Vitals:   11/03/14 0535 11/03/14 1433 11/03/14 2107 11/04/14 0545  BP: 113/72 106/66 111/64 110/72  Pulse:  70 71 81  Temp: 97.5 F (36.4 C) 97.9 F (36.6 C) 97.5 F (36.4 C) 98.4 F (36.9 C)  TempSrc: Oral Oral Oral Oral  Resp: 28 18 19 19  Height:      Weight: 64.8 kg (142 lb 13.7 oz)     SpO2: 98% 100% 97% 99%    Last BM Date: 11/03/14  Intake/Output   Yesterday:  07/12 0701 - 07/13 0700 In: 2352.5 [P.O.:220; I.V.:1432.5; IV Piggyback:700] Out: 1400 [Urine:800; Emesis/NG output:600] This shift:    I/O last 3 completed shifts: In: 3501.3 [P.O.:220; I.V.:2451.3; NG/GT:30; IV Piggyback:800] Out: 2080 [Urine:1280; Emesis/NG output:800]   Physical Exam: General: Pt awake/alert/oriented x4 in no acute distress Abdomen: +BS, distended, non tender. RUQ drain.     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   Malignant ascites   Biliary obstruction due to malignant neoplasm    Results:   Labs: Results for orders placed or performed during the hospital encounter of 11/01/14 (from the past 48 hour(s))  Glucose, capillary     Status: Abnormal   Collection Time: 11/03/14  7:49 AM  Result Value Ref Range   Glucose-Capillary 102 (H) 65 - 99 mg/dL  Hepatic function panel Once     Status: Abnormal   Collection Time: 11/03/14  1:07 PM  Result Value Ref Range   Total Protein 6.5 6.5 - 8.1 g/dL   Albumin 1.9 (L) 3.5 - 5.0 g/dL   AST 77 (H) 15 - 41 U/L   ALT 32 17 - 63 U/L   Alkaline Phosphatase 1579 (H) 38 - 126 U/L   Total  Bilirubin 6.4 (H) 0.3 - 1.2 mg/dL   Bilirubin, Direct 3.9 (H) 0.1 - 0.5 mg/dL   Indirect Bilirubin 2.5 (H) 0.3 - 0.9 mg/dL  Comprehensive metabolic panel     Status: Abnormal   Collection Time: 11/04/14  5:00 AM  Result Value Ref Range   Sodium 131 (L) 135 - 145 mmol/L   Potassium 3.4 (L) 3.5 - 5.1 mmol/L   Chloride 99 (L) 101 - 111 mmol/L   CO2 21 (L) 22 - 32 mmol/L   Glucose, Bld 87 65 - 99 mg/dL   BUN 7 6 - 20 mg/dL   Creatinine, Ser 0.45 (L) 0.61 - 1.24 mg/dL   Calcium 7.9 (L) 8.9 - 10.3 mg/dL   Total Protein 6.3 (L) 6.5 - 8.1 g/dL   Albumin 1.9 (L) 3.5 - 5.0 g/dL   AST 76 (H) 15 - 41 U/L   ALT 29 17 - 63 U/L   Alkaline Phosphatase 1537 (H) 38 - 126 U/L   Total Bilirubin 6.5 (H) 0.3 - 1.2 mg/dL   GFR calc non Af Amer >60 >60 mL/min   GFR calc Af Amer >60 >60 mL/min    Comment: (NOTE) The eGFR has been calculated using the CKD EPI equation. This   calculation has not been validated in all clinical situations. eGFR's persistently <60 mL/min signify possible Chronic Kidney Disease.    Anion gap 11 5 - 15  CBC     Status: Abnormal   Collection Time: 11/04/14  5:00 AM  Result Value Ref Range   WBC 6.2 4.0 - 10.5 K/uL   RBC 3.04 (L) 4.22 - 5.81 MIL/uL   Hemoglobin 8.8 (L) 13.0 - 17.0 g/dL   HCT 27.1 (L) 39.0 - 52.0 %   MCV 89.1 78.0 - 100.0 fL   MCH 28.9 26.0 - 34.0 pg   MCHC 32.5 30.0 - 36.0 g/dL   RDW 24.2 (H) 11.5 - 15.5 %   Platelets 453 (H) 150 - 400 K/uL  Glucose, capillary     Status: None   Collection Time: 11/04/14  7:22 AM  Result Value Ref Range   Glucose-Capillary 90 65 - 99 mg/dL    Imaging / Studies: Dg Abd 2 Views  11/02/2014   CLINICAL DATA:  Abdominal distention with bowel obstruction  EXAM: ABDOMEN - 2 VIEW  COMPARISON:  November 01, 2014  FINDINGS: Supine and upright images obtained. Drain remains in the lateral right abdomen with postoperative change in the right abdomen. There is small bowel dilatation with multiple air-fluid levels in a pattern  concerning for obstruction. No free air. There is vascular calcification the pelvis.  IMPRESSION: The bowel gas pattern is consistent with a degree of obstruction. No free air. Drain remains in the lateral right abdomen. Overall appearance is similar compared to 1 day prior.   Electronically Signed   By: Lowella Grip III M.D.   On: 11/02/2014 08:44    Medications / Allergies:  Scheduled Meds: . bisacodyl  10 mg Rectal BID  . ciprofloxacin  400 mg Intravenous Q12H  . metronidazole  500 mg Intravenous Q8H  . pantoprazole (PROTONIX) IV  40 mg Intravenous Q12H  . primidone  250 mg Oral q morning - 10a  . propranolol  10 mg Oral BID  . saccharomyces boulardii  250 mg Oral BID  . senna  1 tablet Oral Daily  . sucralfate  1 g Oral TID WC & HS   Continuous Infusions: . sodium chloride 75 mL/hr at 11/04/14 0229   PRN Meds:.acetaminophen **OR** acetaminophen, HYDROmorphone (DILAUDID) injection, ondansetron **OR** ondansetron (ZOFRAN) IV, sodium chloride  Antibiotics: Anti-infectives    Start     Dose/Rate Route Frequency Ordered Stop   11/01/14 1800  ciprofloxacin (CIPRO) IVPB 400 mg     400 mg 200 mL/hr over 60 Minutes Intravenous Every 12 hours 11/01/14 1707     11/01/14 1800  metroNIDAZOLE (FLAGYL) IVPB 500 mg     500 mg 100 mL/hr over 60 Minutes Intravenous Every 8 hours 11/01/14 1707         Assessment/Plan Recurrent small bowel obstruction Abdominal carcinomatosis  -NGT is clamped and pt tolerating clears, no nausea or vomiting.  No flatus.  Had a BM after suppository yesterday.  Awaiting AXR. -mobilize, IV hydration, pain control and ant-emetics. 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 GI on board. Plan for ERCP once SBO improves.  Hx exploratory laparotomy resection TI and Rt colon, small bowel bypass complicated by post op abscess---Dr. Excell Seltzer 09/09/14--IR study did not show a fistula or an abscess and drain was removed.  I will stop his antibiotics.   Erby Pian,  [facility] Pager [phone])   11/04/2014 8:03 AM

## 2014-11-04 NOTE — Progress Notes (Signed)
[facility] PROGRESS NOTE  Wesley Dillon OTR:[id number] DOB: May 22, 1945 DOA: 11/01/2014 PCP: Wesley Heck, MD Interim summary: Wesley Dillon is 69 year old gentleman with prior h/o abdominal carcinomatosis , presents with recurrent sbo. He was last admitted a week ago.   HPI/Subjective: Has occasional crampy abdominal pain which improves after IV pain medication. Passing gas. Abdomen still distended.   Assessment/Plan: 1. Recurrent SBO: - due to cancer mentioned below Not improving, NG tube placed- Xray still shows no improvement- tube needed to be advanced today- in the past, his obstructions have resolved with conservative treatment  Surgery consulted - considering a venting g tube for palliative purposes  2. Abdominal carcinomatosis with unknown primary:  - s/p 4 cycles of chemo, followed by expl lap in 08/2014.  Further management as per oncology.   3. Elevated alk phos and elevated bilirubin:  MRI showed intra and extra hepatobiliary ductal dilatation.  Requested GI consult and plan for ERCP with palliative stent when SBO improves.  4. SEVERE Protein calorie malnutrition: - nutrition consulted  5. Hyponatremia  - cont to follow with hydration  Code Status: FULL CODE  Family Communication: none at bedside.  Disposition Plan: remain inpatient.    Consultants:  Oncology  Surgery  GI  Procedures:    Antibiotics:     Objective: Filed Vitals:   11/04/14 1357  BP: 113/57  Pulse: 78  Temp: 97.8 F (36.6 C)  Resp: 20    Intake/Output Summary (Last 24 hours) at 11/04/14 1818 Last data filed at 11/04/14 1358  Gross per 24 hour  Intake 1532.5 ml  Output   1640 ml  Net -107.5 ml   Filed Weights   11/01/14 1700 11/02/14 0355 11/03/14 0535  Weight: 69.1 kg (152 lb 5.4 oz) 69.8 kg (153 lb 14.1 oz) 64.8 kg (142 lb 13.7 oz)    Exam:   General:  Alert  Comfortable.   Cardiovascular: s1s2  Respiratory: diminished breath sounds at  bases.   Abdomen: firm, distended bowel sounds feeble  Musculoskeletal: no pedal edema , cyanosis or clubbing.   Data Reviewed: Basic Metabolic Panel:  Recent Labs Lab 11/01/14 0847 11/01/14 1715 11/02/14 0345 11/04/14 0500  NA 129* 130* 128* 131*  K 3.6 3.7 3.5 3.4*  CL 95* 97* 96* 99*  CO2 _0 21*  GLUCOSE 108* 100* 99 87  BUN _1 CREATININE 0.47* 0.59* 0.53* 0.45*  CALCIUM 8.3* 8.3* 8.1* 7.9*  MG  --  1.4*  --   --   PHOS  --  3.5  --   --    Liver Function Tests:  Recent Labs Lab 11/01/14 0847 11/01/14 1715 11/03/14 1307 11/04/14 0500  AST 79* 73* 77* 76*  ALT 32 31 32 29  ALKPHOS 1446* 1298* 1579* 1537*  BILITOT 5.0* 5.3* 6.4* 6.5*  PROT 7.2 7.0 6.5 6.3*  ALBUMIN 2.1* 2.1* 1.9* 1.9*   No results for input(s): LIPASE, AMYLASE in the last 168 hours. No results for input(s): AMMONIA in the last 168 hours. CBC:  Recent Labs Lab 11/01/14 0847 11/01/14 1715 11/02/14 0345 11/04/14 0500  WBC 8.2 8.2 6.9 6.2  NEUTROABS 5.8 5.4  --   --   HGB 9.8* 9.5* 8.7* 8.8*  HCT 30.4* 29.3* 27.3* 27.1*  MCV 88.6 89.1 90.4 89.1  PLT 565* 521* 502* 453*   Cardiac Enzymes: No results for input(s): CKTOTAL, CKMB, CKMBINDEX, TROPONINI in the last 168 hours. BNP (last 3 results)  Recent Labs  10/06/14 0545 10/11/14 0802  BNP 574.2* 223.3*    ProBNP (last 3 results) No results for input(s): PROBNP in the last 8760 hours.  CBG:  Recent Labs Lab 11/03/14 0749 11/04/14 0722  GLUCAP 102* 90    No results found for this or any previous visit (from the past 240 hour(s)).   Studies: Dg Abd 1 View  11/04/2014   CLINICAL DATA:  Follow-up of small bowel obstruction, clinically improved but persistent abdominal distention  EXAM: ABDOMEN - 1 VIEW  COMPARISON:  Abdominal film of November 02, 2014  FINDINGS: The pigtail catheter on the right has been removed. There remain loops of mildly distended gas-filled small bowel in the mid abdomen. There is gas in the  pelvis likely in the rectum. The esophagogastric tube's proximal port lies at or just below the GE junction. The tip lies in the gastric cardia. The bony structures are unremarkable. There are no abnormal soft tissue calcifications.  IMPRESSION: 1. Persistent distention of small-bowel loops in the mid abdomen consistent with a mid to distal small bowel obstruction. 2. Advancement of the nasogastric tube by 10 cm is recommended to assure that the proximal port lies below the GE junction.   Electronically Signed   By: Beaux  Martinique M.D.   On: 11/04/2014 09:12   Ir Sinus/fist Tube Chk-non Gi  11/04/2014   CLINICAL DATA:  Follow-up abdominal abscess drain. No significant drainage from the catheter and no obvious collection on recent CT imaging. 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  EXAM: DRAIN INJECTION WITH FLUOROSCOPY  Physician: Stephan Minister. Henn, MD  FLUOROSCOPY TIME:  24 seconds, 6.3 mGy  MEDICATIONS AND MEDICAL HISTORY: None  ANESTHESIA/SEDATION: Moderate sedation time: None  CONTRAST:  10 mL Omnipaque-300  PROCEDURE: Patient was placed supine on the interventional table. Contrast was injected through the right abdominal drain. The catheter suture was removed. The catheter was cut and completely removed. Bandage placed at the old drain site.  FINDINGS: Injection of contrast demonstrates reflux around the catheter and drainage onto the skin. There is no evidence for an abscess collection. No evidence for a fistula connection to bowel.  COMPLICATIONS: None  IMPRESSION: Negative for a residual abscess cavity and negative for a fistula. As a result, the drain was completely removed.   Electronically Signed   By: Markus Daft M.D.   On: 11/04/2014 08:40    Scheduled Meds: . bisacodyl  10 mg Rectal BID  . pantoprazole (PROTONIX) IV  40 mg Intravenous Q12H  . primidone  250 mg Oral q morning - 10a  . propranolol  10 mg Oral BID  . saccharomyces boulardii  250 mg Oral BID  . senna  1 tablet Oral Daily  . sucralfate   1 g Oral TID WC & HS   Continuous Infusions: . 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     [facility]  [email or web address], password [facility] 11/04/2014, 6:18 PM  LOS: 3 days

## 2014-11-04 NOTE — Care Management Important Message (Signed)
Important Message  Patient Details  Name: Wesley Dillon MRN: [id number] Date of Birth: [birth year]   Medicare Important Message Given:  [facility] notification given    Camillo Flaming 11/04/2014, 10:52 AMImportant Message  Patient Details  Name: Wesley Dillon MRN: [id number] Date of Birth: 17-Oct-1945   Medicare Important Message Given:  Yes-second notification given    Camillo Flaming 11/04/2014, 10:52 AM

## 2014-11-04 NOTE — Consult Note (Signed)
   [facility] CM Inpatient Consult   11/04/2014  Wesley Dillon 10-10-1945 [id number]   Went to bedside to speak with [name] and wife. Mrs Mancha reports patient no longer has [employer] but [employer]. Discussed [facility] Care Management services and offered follow up. Both [name] and wife report he does not need any follow up with [facility] and that he is pretty covered. Appreciative of the bedside visit. Will make inpatient RNCM aware that patient declined services. Brochure and contact information left at bedside.  Marthenia Rolling, MSN-Ed, RN,BSN [facility] Liaison [phone]

## 2014-11-05 ENCOUNTER — Encounter (HOSPITAL_COMMUNITY)
Admission: EM | Disposition: A | Discharge status: Hospice | Payer: Self-pay | Source: Home / Self Care | Attending: Internal Medicine

## 2014-11-05 ENCOUNTER — Inpatient Hospital Stay (HOSPITAL_COMMUNITY): Payer: PPO | Admitting: Anesthesiology

## 2014-11-05 ENCOUNTER — Encounter (HOSPITAL_COMMUNITY): Payer: Self-pay

## 2014-11-05 ENCOUNTER — Inpatient Hospital Stay (HOSPITAL_COMMUNITY): Payer: PPO

## 2014-11-05 DIAGNOSIS — K831 Obstruction of bile duct: Secondary | ICD-10-CM | POA: Insufficient documentation

## 2014-11-05 DIAGNOSIS — R933 Abnormal findings on diagnostic imaging of other parts of digestive tract: Secondary | ICD-10-CM | POA: Insufficient documentation

## 2014-11-05 HISTORY — PX: ERCP: SHX5425

## 2014-11-05 LAB — COMPREHENSIVE METABOLIC PANEL
ALBUMIN: 2 g/dL — AB (ref 3.5–5.0)
ALK PHOS: 1714 U/L — AB (ref 38–126)
ALT: 33 U/L (ref 17–63)
AST: 87 U/L — AB (ref 15–41)
Anion gap: 9 (ref 5–15)
BUN: 6 mg/dL (ref 6–20)
CO2: 22 mmol/L (ref 22–32)
CREATININE: 0.39 mg/dL — AB (ref 0.61–1.24)
Calcium: 7.8 mg/dL — ABNORMAL LOW (ref 8.9–10.3)
Chloride: 102 mmol/L (ref 101–111)
GFR calc Af Amer: >60 mL/min (ref >60)
Glucose, Bld: 102 mg/dL — ABNORMAL HIGH (ref 65–99)
Potassium: 2.9 mmol/L — ABNORMAL LOW (ref 3.5–5.1)
Sodium: 133 mmol/L — ABNORMAL LOW (ref 135–145)
TOTAL PROTEIN: 6.8 g/dL (ref 6.5–8.1)
Total Bilirubin: 7.8 mg/dL — ABNORMAL HIGH (ref 0.3–1.2)

## 2014-11-05 LAB — GLUCOSE, CAPILLARY
GLUCOSE-CAPILLARY: 84 mg/dL (ref 65–99)
Glucose-Capillary: 107 mg/dL — ABNORMAL HIGH (ref 65–99)
Glucose-Capillary: 125 mg/dL — ABNORMAL HIGH (ref 65–99)
Glucose-Capillary: 85 mg/dL (ref 65–99)

## 2014-11-05 LAB — MAGNESIUM: MAGNESIUM: 1.5 mg/dL — AB (ref 1.7–2.4)

## 2014-11-05 LAB — PHOSPHORUS: PHOSPHORUS: 2.5 mg/dL (ref 2.5–4.6)

## 2014-11-05 SURGERY — ERCP, WITH INTERVENTION IF INDICATED
Anesthesia: General

## 2014-11-05 SURGERY — ERCP, WITH INTERVENTION IF INDICATED
Anesthesia: Monitor Anesthesia Care

## 2014-11-05 MED ORDER — CIPROFLOXACIN IN D5W 400 MG/200ML IV SOLN
INTRAVENOUS | Status: AC
  Filled 2014-11-05: qty 200

## 2014-11-05 MED ORDER — EPHEDRINE SULFATE 50 MG/ML IJ SOLN
INTRAMUSCULAR | Status: DC | PRN
  Administered 2014-11-05: 10 mg via INTRAVENOUS

## 2014-11-05 MED ORDER — POTASSIUM PHOSPHATES 15 MMOLE/5ML IV SOLN
10.0000 mmol | Freq: Once | INTRAVENOUS | Status: AC
  Administered 2014-11-05: 10 mmol via INTRAVENOUS
  Filled 2014-11-05: qty 3.33

## 2014-11-05 MED ORDER — SODIUM CHLORIDE 0.9 % IJ SOLN
INTRAMUSCULAR | Status: AC
  Filled 2014-11-05: qty 10

## 2014-11-05 MED ORDER — POTASSIUM CHLORIDE 10 MEQ/100ML IV SOLN
10.0000 meq | INTRAVENOUS | Status: AC
  Administered 2014-11-05 (×5): 10 meq via INTRAVENOUS
  Filled 2014-11-05 (×7): qty 100

## 2014-11-05 MED ORDER — PROPOFOL 10 MG/ML IV BOLUS
INTRAVENOUS | Status: DC | PRN
  Administered 2014-11-05: 150 mg via INTRAVENOUS

## 2014-11-05 MED ORDER — SODIUM CHLORIDE 0.9 % IJ SOLN
10.0000 mL | INTRAMUSCULAR | Status: DC | PRN
  Administered 2014-11-07 (×2): 10 mL
  Filled 2014-11-05 (×2): qty 40

## 2014-11-05 MED ORDER — POTASSIUM CHLORIDE 10 MEQ/100ML IV SOLN
10.0000 meq | Freq: Once | INTRAVENOUS | Status: AC
  Administered 2014-11-05: 10 meq via INTRAVENOUS

## 2014-11-05 MED ORDER — SODIUM CHLORIDE 0.9 % IV SOLN
INTRAVENOUS | Status: AC
  Administered 2014-11-05: 35 mL/h via INTRAVENOUS

## 2014-11-05 MED ORDER — SODIUM CHLORIDE 0.9 % IV SOLN
INTRAVENOUS | Status: DC | PRN
  Administered 2014-11-05 (×2): via INTRAVENOUS

## 2014-11-05 MED ORDER — ZINC TRACE METAL 1 MG/ML IV SOLN
INTRAVENOUS | Status: AC
  Administered 2014-11-05: 18:00:00 via INTRAVENOUS
  Filled 2014-11-05: qty 960

## 2014-11-05 MED ORDER — FENTANYL CITRATE (PF) 100 MCG/2ML IJ SOLN: 25.0000 ug | INTRAMUSCULAR | Status: DC | PRN

## 2014-11-05 MED ORDER — GLUCAGON HCL RDNA (DIAGNOSTIC) 1 MG IJ SOLR
INTRAMUSCULAR | Status: DC | PRN
  Administered 2014-11-05 (×2): 0.25 mg via INTRAVENOUS

## 2014-11-05 MED ORDER — DEXAMETHASONE SODIUM PHOSPHATE 10 MG/ML IJ SOLN
INTRAMUSCULAR | Status: AC
  Filled 2014-11-05: qty 1

## 2014-11-05 MED ORDER — ONDANSETRON HCL 4 MG/2ML IJ SOLN
INTRAMUSCULAR | Status: AC
  Filled 2014-11-05: qty 2

## 2014-11-05 MED ORDER — PROPOFOL 10 MG/ML IV BOLUS
INTRAVENOUS | Status: AC
  Filled 2014-11-05: qty 20

## 2014-11-05 MED ORDER — INDOMETHACIN 50 MG RE SUPP
100.0000 mg | Freq: Once | RECTAL | Status: AC
  Administered 2014-11-05: 100 mg via RECTAL

## 2014-11-05 MED ORDER — GLUCAGON HCL RDNA (DIAGNOSTIC) 1 MG IJ SOLR
INTRAMUSCULAR | Status: AC
  Filled 2014-11-05: qty 1

## 2014-11-05 MED ORDER — SUCCINYLCHOLINE CHLORIDE 20 MG/ML IJ SOLN
INTRAMUSCULAR | Status: DC | PRN
  Administered 2014-11-05: 100 mg via INTRAVENOUS

## 2014-11-05 MED ORDER — INDOMETHACIN 50 MG RE SUPP
RECTAL | Status: AC
  Filled 2014-11-05: qty 2

## 2014-11-05 MED ORDER — LIDOCAINE HCL (CARDIAC) 20 MG/ML IV SOLN
INTRAVENOUS | Status: DC | PRN
  Administered 2014-11-05: 100 mg via INTRAVENOUS

## 2014-11-05 MED ORDER — FAT EMULSION 20 % IV EMUL
240.0000 mL | INTRAVENOUS | Status: AC
  Administered 2014-11-05: 240 mL via INTRAVENOUS
  Filled 2014-11-05: qty 250

## 2014-11-05 MED ORDER — PROMETHAZINE HCL 25 MG/ML IJ SOLN: 6.2500 mg | INTRAMUSCULAR | Status: DC | PRN

## 2014-11-05 MED ORDER — CEFAZOLIN SODIUM-DEXTROSE 2-3 GM-% IV SOLR
2.0000 g | INTRAVENOUS | Status: AC
  Administered 2014-11-06: 2 g via INTRAVENOUS

## 2014-11-05 MED ORDER — CIPROFLOXACIN IN D5W 400 MG/200ML IV SOLN
400.0000 mg | Freq: Two times a day (BID) | INTRAVENOUS | Status: DC
  Administered 2014-11-05: 400 mg via INTRAVENOUS

## 2014-11-05 MED ORDER — EPHEDRINE SULFATE 50 MG/ML IJ SOLN
INTRAMUSCULAR | Status: AC
  Filled 2014-11-05: qty 1

## 2014-11-05 MED ORDER — MAGNESIUM SULFATE 2 GM/50ML IV SOLN
2.0000 g | Freq: Once | INTRAVENOUS | Status: AC
  Administered 2014-11-05: 2 g via INTRAVENOUS
  Filled 2014-11-05: qty 50

## 2014-11-05 MED ORDER — PHENYLEPHRINE 40 MCG/ML (10ML) SYRINGE FOR IV PUSH (FOR BLOOD PRESSURE SUPPORT)
PREFILLED_SYRINGE | INTRAVENOUS | Status: AC
  Filled 2014-11-05: qty 10

## 2014-11-05 MED ORDER — SODIUM CHLORIDE 0.9 % IV SOLN
INTRAVENOUS | Status: DC | PRN
  Administered 2014-11-05: 40 mL

## 2014-11-05 MED ORDER — CIPROFLOXACIN IN D5W 400 MG/200ML IV SOLN
400.0000 mg | Freq: Once | INTRAVENOUS | Status: AC
  Administered 2014-11-05: 400 mg via INTRAVENOUS

## 2014-11-05 MED ORDER — SODIUM CHLORIDE 0.9 % IV SOLN: INTRAVENOUS | Status: DC

## 2014-11-05 MED ORDER — ONDANSETRON HCL 4 MG/2ML IJ SOLN
INTRAMUSCULAR | Status: DC | PRN
  Administered 2014-11-05: 4 mg via INTRAVENOUS

## 2014-11-05 MED ORDER — PHENYLEPHRINE HCL 10 MG/ML IJ SOLN
INTRAMUSCULAR | Status: DC | PRN
  Administered 2014-11-05 (×2): 80 ug via INTRAVENOUS

## 2014-11-05 MED ORDER — INSULIN ASPART 100 UNIT/ML ~~LOC~~ SOLN
0.0000 [IU] | Freq: Four times a day (QID) | SUBCUTANEOUS | Status: DC
  Administered 2014-11-06 – 2014-11-07 (×4): 1 [IU] via SUBCUTANEOUS

## 2014-11-05 MED ORDER — DEXAMETHASONE SODIUM PHOSPHATE 10 MG/ML IJ SOLN
INTRAMUSCULAR | Status: DC | PRN
  Administered 2014-11-05: 10 mg via INTRAVENOUS

## 2014-11-05 MED ORDER — LIDOCAINE HCL (CARDIAC) 20 MG/ML IV SOLN
INTRAVENOUS | Status: AC
  Filled 2014-11-05: qty 5

## 2014-11-05 NOTE — H&P (View-Only) (Signed)
Consultation  Referring Provider:  [facility]    Primary Care Physician:  Gerrit Heck, MD Primary Gastroenterologist:   Dr. [name]      Reason for Consultation:   Elevated LFTs  / Biliary duct dilation         HPI:   Wesley Dillon is a 69 y.o. male diagnosed with carcinomatosis in March of this year. He had been having abdominal pain and underwent GI workup by Dr. [name]. Ultimately an u/s revealed gallstones but while waiting for cholecystectomy patient developed a bowel obstruction. CTscan showed carcinomatosis. Omental biopsy c/w adenocarcinoma. No clear primary tumor site was found but PET suggested small bowel primary. Patient readmitted a couple of times since then with SBO. In May a CTscan suggested an apple core lesion of small bowel. In May patient underwent exploratory laparotomy with a resection of terminal ileum and right colon. Biopsies c/w adenocarcinoma. Post-op course complicated by an abdominal abscess. Patient has been undergoing chemo with Dr. Learta Codding. He had another admission earlier this month for abdominal pain and nausea. CTscan revealed dilated small bowel but follow up xray negative for obstruction and patient responded to bowel rest. A repeat CTscan has now revealed moderate biliary duct dilation,  he is quite cholestatic. Patient is back in hospital with recurrent SBO.   Past Medical History  Diagnosis Date  . Tremor     takes Primidone 250 once daily and inderall 160 daily for this-has had it since he was 21  . Pneumonia 06/2012  . Macular degeneration   . Benign essential tremor   . Coronary artery disease   . Anemia 06/30/2014  . 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 Essential hypertension 08/18/2014  . 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 Ascending aortic aneurysm 08/2014  . Sepsis 09/2014  . GERD (gastroesophageal reflux disease)   . Postoperative intra-abdominal abscess 09/2014  . SBO (small bowel  obstruction)     recurrent  . Hyperlipidemia   . Hypoalbuminemia 09/2014    Past Surgical History  Procedure Laterality Date  . Tonsillectomy    . Video bronchoscopy Bilateral 04/29/2013    Procedure: VIDEO BRONCHOSCOPY WITHOUT FLUORO;  Surgeon: Collene Gobble, MD;  Location: [facility];  Service: Cardiopulmonary;  Laterality: Bilateral;  . Laparotomy N/A 09/09/2014    Procedure: EXPLORATORY LAPAROTOMY resection terminal ileum and right colon;  Surgeon: Excell Seltzer, MD;  Location: [facility]S;  Service: General;  Laterality: N/A;  . Bowel resection N/A 09/09/2014    Procedure: SMALL BOWEL bypass;  Surgeon: Excell Seltzer, MD;  Location: [facility] ORS;  Service: General;  Laterality: N/A;  . Colostomy revision  09/09/2014    Procedure: COLON RESECTION RIGHT;  Surgeon: Excell Seltzer, MD;  Location: [facility] ORS;  Service: General;;  . Abcess drainage  10/05/2014    intra-abd abscess, drain placed by IR    Family History  Problem Relation Age of Onset  . CAD Father 71    Died MI  . Atrial fibrillation Father   . Asthma Father   . Alzheimer's disease Mother   . Diabetes Mother   . Heart disease Mother   . Atrial fibrillation Mother   . Seizures Son     Died status epilepticus     History  Substance Use Topics  . Smoking status: Never Smoker   . Smokeless tobacco: Never Used  . Alcohol Use: No     Comment: wine with dinner    Prior to Admission medications  Medication Sig Start Date End Date Taking? Authorizing Provider  acetaminophen (TYLENOL) 500 MG tablet Take 1,000 mg by mouth every 6 (six) hours as needed for moderate pain or headache.   Yes Historical Provider, MD  ciprofloxacin (CIPRO) 500 MG tablet Take 1 tablet (500 mg total) by mouth 2 (two) times daily. 10/09/14  Yes Janece Canterbury, MD  diphenoxylate-atropine (LOMOTIL) 2.5-0.025 MG per tablet Take 1 tablet by mouth 4 (four) times daily as needed for diarrhea or loose stools. 10/05/14  Yes Ladell Pier, MD  furosemide  (LASIX) 20 MG tablet Take 1 tablet (20 mg total) by mouth daily. 10/28/14  Yes Ladell Pier, MD  lidocaine (LIDODERM) 5 % Place 1 patch onto the skin daily. Remove & Discard patch within 12 hours or as directed by MD 10/12/14  Yes Janece Canterbury, MD  lidocaine-prilocaine (EMLA) cream Apply 1 application topically as needed. Apply to [facility] 1-2 hours prior to stick and cover with plastic wrap 07/08/14  Yes Ladell Pier, MD  megestrol (MEGACE) 40 MG/ML suspension Take 10 mLs (400 mg total) by mouth daily. Patient taking differently: Take 200 mg by mouth 2 (two) times daily.  10/09/14  Yes Janece Canterbury, MD  metroNIDAZOLE (FLAGYL) 500 MG tablet Take 1 tablet (500 mg total) by mouth 3 (three) times daily. Patient taking differently: Take 500 mg by mouth 2 (two) times daily.  10/09/14  Yes Janece Canterbury, MD  Multiple Vitamins-Minerals (PRESERVISION AREDS PO) Take 2 capsules by mouth every morning.    Yes Historical Provider, MD  ondansetron (ZOFRAN ODT) 4 MG disintegrating tablet Take 1 tablet (4 mg total) by mouth every 4 (four) hours as needed for nausea or vomiting. 08/11/14  Yes Ladell Pier, MD  oxyCODONE (OXY IR/ROXICODONE) 5 MG immediate release tablet Take 1-2 tablets (5-10 mg total) by mouth every 4 (four) hours as needed for severe pain. 07/28/14  Yes Ladell Pier, MD  pantoprazole (PROTONIX) 40 MG tablet Take 1 tablet (40 mg total) by mouth 2 (two) times daily. 10/09/14  Yes Janece Canterbury, MD  potassium chloride (K-DUR) 10 MEQ tablet Take 2 tablets (20 mEq total) by mouth daily. 10/28/14  Yes Ladell Pier, MD  primidone (MYSOLINE) 250 MG tablet Take 1 tablet (250 mg total) by mouth at bedtime. Patient taking differently: Take 250 mg by mouth every morning.  10/21/13  Yes Asencion Partridge Dohmeier, MD  prochlorperazine (COMPAZINE) 10 MG tablet Take 1 tablet (10 mg total) by mouth every 6 (six) hours as needed for nausea. 07/08/14  Yes Ladell Pier, MD  propranolol (INDERAL) 10 MG tablet TAKE 1  TABLET BY MOUTH 2 TIMES DAILY. Patient taking differently: TAKE 2 TABLET BY MOUTH ONCE  DAILY. 10/28/14  Yes Ladell Pier, MD  saccharomyces boulardii (FLORASTOR) 250 MG capsule Take 1 capsule (250 mg total) by mouth 2 (two) times daily. 09/18/14  Yes Donne Hazel, MD  sucralfate (CARAFATE) 1 G tablet Take 1 tablet (1 g total) by mouth 4 (four) times daily -  with meals and at bedtime. Chew prior to swallowing 10/09/14  Yes Janece Canterbury, MD    Current Facility-Administered Medications  Medication Dose Route Frequency Provider Last Rate Last Dose  . 0.9 %  sodium chloride infusion   Intravenous Continuous Robbie Lis, MD 75 mL/hr at 11/02/14 0120    . acetaminophen (TYLENOL) tablet 650 mg  650 mg Oral Q6H PRN Robbie Lis, MD       Or  . acetaminophen (  TYLENOL) suppository 650 mg  650 mg Rectal Q6H PRN Robbie Lis, MD      . ciprofloxacin (CIPRO) IVPB 400 mg  400 mg Intravenous Q12H Robbie Lis, MD   400 mg at 11/02/14 0610  . HYDROmorphone (DILAUDID) injection 1 mg  1 mg Intravenous Q2H PRN Robbie Lis, MD   1 mg at 11/02/14 1401  . metroNIDAZOLE (FLAGYL) IVPB 500 mg  500 mg Intravenous Q8H Robbie Lis, MD   500 mg at 11/02/14 1009  . ondansetron (ZOFRAN) tablet 4 mg  4 mg Oral Q6H PRN Robbie Lis, MD       Or  . ondansetron [facility]) injection 4 mg  4 mg Intravenous Q6H PRN Robbie Lis, MD   4 mg at 11/02/14 1401  . pantoprazole (PROTONIX) injection 40 mg  40 mg Intravenous Q12H Robbie Lis, MD   40 mg at 11/02/14 1040  . primidone (MYSOLINE) tablet 250 mg  250 mg Oral q morning - 10a Robbie Lis, MD   250 mg at 11/02/14 1154  . propranolol (INDERAL) tablet 10 mg  10 mg Oral BID Robbie Lis, MD   10 mg at 11/02/14 1008  . saccharomyces boulardii (FLORASTOR) capsule 250 mg  250 mg Oral BID Robbie Lis, MD   250 mg at 11/02/14 1008  . senna (SENOKOT) tablet 8.6 mg  1 tablet Oral Daily Robbie Lis, MD   8.6 mg at 11/02/14 1008  . sodium chloride 0.9 % injection 10-40  mL  10-40 mL Intracatheter PRN Robbie Lis, MD   10 mL at 11/02/14 0345  . sucralfate (CARAFATE) tablet 1 g  1 g Oral TID WC & HS Robbie Lis, MD   1 g at 11/02/14 1232    Allergies as of 11/01/2014  . (No Known Allergies)    Review of Systems:    As per HPI, otherwise negative   Physical Exam:  Vital signs in last 24 hours: Temp:  [97.6 F (36.4 C)-98.2 F (36.8 C)] 98 F (36.7 C) (07/11 1422) Pulse Rate:  [80-83] 82 (07/11 1422) Resp:  [18] 18 (07/11 1422) BP: (110-121)/(70-79) 110/70 mmHg (07/11 1422) SpO2:  [96 %-99 %] 96 % (07/11 1422) Weight:  [152 lb 5.4 oz (69.1 kg)-153 lb 14.1 oz (69.8 kg)] 153 lb 14.1 oz (69.8 kg) (07/11 0355) Last BM Date: 10/31/14 General:   Pleasant white male in NAD cachectic appearing Head:  Normocephalic and atraumatic. Eyes:   Mildly icteric Neck:  Supple Lungs:  Respirations even and unlabored. Decreased bilateral bases Heart:  Regular rate and rhythm; no MRG Abdomen:  Firm moderately distended with hypoactive bowel sounds, drain in the right lower quadrant, well-healed midline abdominal scar Rectal:  Not performed.  Msk:  Symmetrical without gross deformities.  Extremities:  Without edema. Neurologic:  Alert and  oriented x4;  grossly normal neurologically. Skin:  Intact without significant lesions or rashes. Psych:  Alert and cooperative. Normal affect.  LAB RESULTS:  Recent Labs  11/01/14 0847 11/01/14 1715 11/02/14 0345  WBC 8.2 8.2 6.9  HGB 9.8* 9.5* 8.7*  HCT 30.4* 29.3* 27.3*  PLT 565* 521* 502*   BMET  Recent Labs  11/01/14 0847 11/01/14 1715 11/02/14 0345  NA 129* 130* 128*  K 3.6 3.7 3.5  CL 95* 97* 96*  CO2 23 24 23   GLUCOSE 108* 100* 99  BUN 9 9 10   CREATININE 0.47* 0.59* 0.53*  CALCIUM 8.3* 8.3* 8.1*  LFT  Recent Labs  11/01/14 1715  PROT 7.0  ALBUMIN 2.1*  AST 73*  ALT 31  ALKPHOS 1298*  BILITOT 5.3*   PT/INR  Recent Labs  11/01/14 1715  LABPROT 15.8*  INR 1.24    STUDIES: Mr  3d Recon At Scanner  11/02/2014   CLINICAL DATA:  Abdominal pain and distension. History of recent small bowel obstruction and abscess drainage. Peritoneal carcinomatosis with right hemicolectomy.  EXAM: MRI ABDOMEN WITHOUT AND WITH CONTRAST (INCLUDING MRCP)  TECHNIQUE: Multiplanar multisequence MR imaging of the abdomen was performed both before and after the administration of intravenous contrast. Heavily T2-weighted images of the biliary and pancreatic ducts were obtained, and three-dimensional MRCP images were rendered by post processing.  CONTRAST:  52m MULTIHANCE GADOBENATE DIMEGLUMINE 529 MG/ML IV SOLN  COMPARISON:  CT, most recent 10/24/2014.  FINDINGS: Portions of exam are mildly motion degraded.  Lower chest: Small left pleural effusion. Trace right pleural fluid. Heart size upper normal.  Hepatobiliary: Foci of hepatic hyper enhancement within the dome are nonspecific. Example image 17 of series 11001. There are foci of T2 hyperintensity and hypo enhancement along the hepatic capsule, suspicious for carcinomatosis/peritoneal metastasis.  Gallbladder distension with multiple gallstones. Moderate intrahepatic ductal dilatation, including left hepatic duct at 8 mm on image 25 of series 3. Moderate common duct dilatation at 1.3 cm on image 87 of series 4. The common duct undergoes an abrupt transition in the porta hepatis. An approximately 2 cm span of narrowed to obliterated duct is identified. The distal most 2.5 cm of duct is normal in caliber at 6 mm on image 114 of series 400. On post-contrast imaging, there is a amorphous soft tissue and vague hyper enhancement along the tract of the narrowed -obliterated duct. Example images 62-65 of series 11002. No well-defined mass. Is felt to be just to the right of the pancreatic head.  Pancreas: Otherwise within normal limits. No pancreatic duct dilatation or acute pancreatitis.  Spleen: Normal  Adrenals/Urinary Tract: Normal adrenal glands. Normal right  kidney. 1.4 cm interpolar left renal cyst. No hydronephrosis.  Stomach/Bowel: Gastric distension. Diffuse fluid-filled small bowel loops.  Vascular/Lymphatic: Normal caliber of the aorta and branch vessels. 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 Example image 58 of series 11002. No well-defined retroperitoneal adenopathy.  Other: Ascites, moderate in volume. Areas of loculated ascites, including adjacent the caudate lobe of the liver.  Musculoskeletal: Probable hemangioma within the lower thoracic spine, eccentric right.  IMPRESSION: 1. Moderate intra and extrahepatic duct dilatation to the level of an abrupt cut off in the porta hepatis. Vague hyper enhancement along an approximately 2 cm area of common duct obliteration. If the patient has a known primary malignancy, this is suspicious for either ductal or periductal metastasis. Alternatively, given the clinical history of unknown primary, this could represent a primary extrahepatic cholangiocarcinoma. ERCP and/or percutaneous cholangiogram should be considered. These results will be called to the ordering clinician or representative by the Radiologist Assistant, and communication documented in the PACS or zVision Dashboard. 2. 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 3. Cholelithiasis with gallbladder distention. 4. Mildly motion degraded exam. 5. Gastric and bowel distention, likely representing persistent bowel obstruction. The patient may benefit from nasogastric tube placement. 6. Loculated ascites and peritoneal carcinomatosis, as before. 7. Left larger than right pleural effusions.   Electronically Signed   By: KAbigail MiyamotoM.D.   On:  11/02/2014 08:52   Dg Abd 2 Views  11/02/2014   CLINICAL DATA:  Abdominal distention with bowel obstruction  EXAM: ABDOMEN - 2 VIEW  COMPARISON:  November 01, 2014  FINDINGS: Supine and upright images  obtained. Drain remains in the lateral right abdomen with postoperative change in the right abdomen. There is small bowel dilatation with multiple air-fluid levels in a pattern concerning for obstruction. No free air. There is vascular calcification the pelvis.  IMPRESSION: The bowel gas pattern is consistent with a degree of obstruction. No free air. Drain remains in the lateral right abdomen. Overall appearance is similar compared to 1 day prior.   Electronically Signed   By: Lowella Grip III M.D.   On: 11/02/2014 08:44   Mr Abd W/wo Cm/mrcp  11/02/2014   CLINICAL DATA:  Abdominal pain and distension. History of recent small bowel obstruction and abscess drainage. Peritoneal carcinomatosis with right hemicolectomy.  EXAM: MRI ABDOMEN WITHOUT AND WITH CONTRAST (INCLUDING MRCP)  TECHNIQUE: Multiplanar multisequence MR imaging of the abdomen was performed both before and after the administration of intravenous contrast. Heavily T2-weighted images of the biliary and pancreatic ducts were obtained, and three-dimensional MRCP images were rendered by post processing.  CONTRAST:  16m MULTIHANCE GADOBENATE DIMEGLUMINE 529 MG/ML IV SOLN  COMPARISON:  CT, most recent 10/24/2014.  FINDINGS: Portions of exam are mildly motion degraded.  Lower chest: Small left pleural effusion. Trace right pleural fluid. Heart size upper normal.  Hepatobiliary: Foci of hepatic hyper enhancement within the dome are nonspecific. Example image 17 of series 11001. There are foci of T2 hyperintensity and hypo enhancement along the hepatic capsule, suspicious for carcinomatosis/peritoneal metastasis.  Gallbladder distension with multiple gallstones. Moderate intrahepatic ductal dilatation, including left hepatic duct at 8 mm on image 25 of series 3. Moderate common duct dilatation at 1.3 cm on image 87 of series 4. The common duct undergoes an abrupt transition in the porta hepatis. An approximately 2 cm span of narrowed to obliterated duct  is identified. The distal most 2.5 cm of duct is normal in caliber at 6 mm on image 114 of series 400. On post-contrast imaging, there is a amorphous soft tissue and vague hyper enhancement along the tract of the narrowed -obliterated duct. Example images 62-65 of series 11002. No well-defined mass. Is felt to be just to the right of the pancreatic head.  Pancreas: Otherwise within normal limits. No pancreatic duct dilatation or acute pancreatitis.  Spleen: Normal  Adrenals/Urinary Tract: Normal adrenal glands. Normal right kidney. 1.4 cm interpolar left renal cyst. No hydronephrosis.  Stomach/Bowel: Gastric distension. Diffuse fluid-filled small bowel loops.  Vascular/Lymphatic: Normal caliber of the aorta and branch vessels. 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 Example image 58 of series 11002. No well-defined retroperitoneal adenopathy.  Other: Ascites, moderate in volume. Areas of loculated ascites, including adjacent the caudate lobe of the liver.  Musculoskeletal: Probable hemangioma within the lower thoracic spine, eccentric right.  IMPRESSION: 1. Moderate intra and extrahepatic duct dilatation to the level of an abrupt cut off in the porta hepatis. Vague hyper enhancement along an approximately 2 cm area of common duct obliteration. If the patient has a known primary malignancy, this is suspicious for either ductal or periductal metastasis. Alternatively, given the clinical history of unknown primary, this could represent a primary extrahepatic cholangiocarcinoma. ERCP and/or percutaneous cholangiogram should be considered. These results will be called to the ordering clinician or representative by the Radiologist Assistant, and communication documented  in the PACS or zVision Dashboard. 2. 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 3. Cholelithiasis with gallbladder distention. 4.  Mildly motion degraded exam. 5. Gastric and bowel distention, likely representing persistent bowel obstruction. The patient may benefit from nasogastric tube placement. 6. Loculated ascites and peritoneal carcinomatosis, as before. 7. Left larger than right pleural effusions.   Electronically Signed   By: Abigail Miyamoto M.D.   On: 11/02/2014 08:52           Impression / Plan:   69 year old male with carcinomatosis thought to be small bowel primary. PET suggested small bowel primary and patient had a long segment of bowel obstruction for which he underwent resection of TI and right colon in May. Path c/w adenocarcinoma. Now admitted with recurrent SBO, CTscan now showing moderate intra and extrahepatic duct dilatation to the level of an abrupt cut off in the porta hepatis. Vague hyper enhancement along an approximately 2 cm area of common duct obliteration. He is quite cholestatic. Rule out metastatic disease vrs primary malignancy such as cholangiocarcinoma.  Thanks   LOS: 1 day   Tye Savoy  11/02/2014, 3:56 PM  Patient seen, examined, and I agree with the above documentation, including the assessment and plan. 69 year old male with adenocarcinoma of unknown primary status post distal ileum and right colectomy complicated by anastomotic leak requiring percutaneous drain. Profound malnutrition. Status post 4 cycles of chemotherapy. Admitted with recurrent small bowel obstruction and rising LFTs consistent with biliary obstruction. MRCP images reviewed which show intra-and extra hepatobiliary ductal dilatation with fullness at the porta hepatis likely secondary to malignant lymph nodes causing obstruction versus metastasis to the bile duct versus extrahepatic cholangiocarcinoma. No evidence of cholangitis or symptomatic jaundice/pruritus, etc. Will need biliary ductal decompression either with ERCP versus percutaneous drain. ERCP preferable but this will depend on small bowel obstruction. We discussed  and he is aware that ERCP is relatively contraindicated in the setting of bowel obstruction. NG tube decompression If bowel obstruction improves we'll plan ERCP later this week. If not may need PTC Discussed the plan with him extensively.

## 2014-11-05 NOTE — Progress Notes (Signed)
Attempted PIV X2 without success.   For long term treatment with IVFs and TNA.   Recommended a PICC due to length of IV access needed.

## 2014-11-05 NOTE — Progress Notes (Signed)
PARENTERAL NUTRITION CONSULT NOTE - INITIAL  Pharmacy Consult for TPN Indication: Recurrent SBO  No Known Allergies  Patient Measurements: Height: 6\' 1"  (185.4 cm) Weight: 148 lb 5.9 oz (67.3 kg) IBW/kg (Calculated) : 79.9 Usual Weight:   Vital Signs: Temp: 98.4 F (36.9 C) (07/14 0428) Temp Source: Oral (07/14 0428) BP: 118/73 mmHg (07/14 0428) Pulse Rate: 77 (07/14 0428) Intake/Output from previous day: 07/13 0701 - 07/14 0700 In: 7482 [P.O.:814; I.V.:870] Out: 1840 [Urine:375; Emesis/NG LMBEML:5449] Intake/Output from this shift: Total I/O In: -  Out: 300 [Urine:125; Emesis/NG output:175]  Labs:  Recent Labs  11/04/14 0500  WBC 6.2  HGB 8.8*  HCT 27.1*  PLT 453*     Recent Labs  11/03/14 1307 11/04/14 0500  NA  --  131*  K  --  3.4*  CL  --  99*  CO2  --  21*  GLUCOSE  --  87  BUN  --  7  CREATININE  --  0.45*  CALCIUM  --  7.9*  PROT 6.5 6.3*  ALBUMIN 1.9* 1.9*  AST 77* 76*  ALT 32 29  ALKPHOS 1579* 1537*  BILITOT 6.4* 6.5*  BILIDIR 3.9*  --   IBILI 2.5*  --    Estimated Creatinine Clearance: 85.3 mL/min (by C-G formula based on Cr of 0.45).    Recent Labs  11/03/14 0749 11/04/14 0722 11/05/14 0726  GLUCAP 102* 90 85    Medical History: Past Medical History  Diagnosis Date  . Tremor     takes Primidone 250 once daily and inderall 160 daily for this-has had it since he was 21  . Pneumonia 06/2012  . Macular degeneration   . Benign essential tremor   . Coronary artery disease   . Anemia 06/30/2014  . 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 Essential hypertension 08/18/2014  . 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 Ascending aortic aneurysm 08/2014  . Sepsis 09/2014  . GERD (gastroesophageal reflux disease)   . Postoperative intra-abdominal abscess 09/2014  . SBO (small bowel obstruction)     recurrent  . Hyperlipidemia   . Hypoalbuminemia 09/2014      Insulin Requirements in the past 24 hours:  None  Current Nutrition: None  IVF: NS at 75 ml/hr  Central access: currently has implanted port, PICC ordered to be placed today TPN start date: 7/14  ASSESSMENT                                                                                                          HPI: 71 yoM admitted with carcinomatosis with bowel and biliary obstruction.  Pt has h/o abdominal carcinomatosis with unknown primary (currently undergoing chemotherapy, cycle 5 of FOLFOX postponed due to current admission).  Previous obstructions resolved with conservative treatment.  Not improving after 4 days of conservative management this time.  Surgery consulted and recommends surgery (likely 7/15) and for TPN to be initiated today.  Significant events:  7/14 ERCP today  Today, 11/05/2014:    Glucose - no history of DM, serum glucose levels WNL. Will  add CBG checks with start of TPN.  Electrolytes - K 2.9, Mag 1.5, Phos 2.5, Na 133, CorrCa WNL  Renal - SCr low, CrCl~85 ml/min  LFTs - tbili and alk phos extremely elevated 2/2 biliary obstruction  TGs - ordered for AM  Prealbumin - ordered for AM  NUTRITIONAL GOALS                                                                                             RD recs: pending  PLAN                                                                                                                         At 1800 today:  Start Clinimix E 5/15 at 40 ml/hr.  20% fat emulsion at 10 ml/hr.  Plan to advance as tolerated to the goal rate.  TPN to contain standard multivitamins.  Will also add Zinc, Chronium, Selenium.   Reduce IVF to 35 ml/hr.  KCl 10 mEq/100 mL IV x 6 runs. Repeat BMET this evening to see if patient needs further supplement.  KPhos 10 mmol IV x 1.  Magnesium sulfate 2g IV x 1.  Add CBG checks and sensitive scale SSI q6h.   TPN lab panels on Mondays & Thursdays.  F/u RD recommendations for nutritional goals.  F/u daily.  Wesley Dillon 11/05/2014,10:09 AM

## 2014-11-05 NOTE — Progress Notes (Signed)
Patient ID: Wesley Dillon, male   DOB: 08-25-1945, 69 y.o.   MRN: [id number]     [facility]      [address] [place], [place] [address]    Phone: [phone] FAX: [phone]     Subjective: Severe crampy pain lasting 15 mins.  Resolve with dilaudid.  Flatus.  No BM.  Objective:  Vital signs:  Filed Vitals:   11/04/14 0545 11/04/14 1357 11/04/14 2001 11/05/14 0428  BP: 110/72 113/57 120/70 118/73  Pulse: 81 78 73 77  Temp: 98.4 F (36.9 C) 97.8 F (36.6 C) 97.7 F (36.5 C) 98.4 F (36.9 C)  TempSrc: Oral Oral Oral Oral  Resp: 19 20 20 18   Height:      Weight:    67.3 kg (148 lb 5.9 oz)  SpO2: 99% 98% 100% 98%    Last BM Date: 11/03/14  Intake/Output   Yesterday:  07/13 0701 - 07/14 0700 In: 2706 [P.O.:814; I.V.:870] Out: 2376 [Urine:375; Emesis/NG EGBTDV:7616] This shift:  Total I/O In: -  Out: 300 [Urine:125; Emesis/NG output:175]   Physical Exam: General: Pt awake/alert/oriented x4 in no acute distress   Abdomen: +BS.  Distended.  Non tender.   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   Malignant ascites   Biliary obstruction due to malignant neoplasm   Peritoneal carcinomatosis   Hyperbilirubinemia    Results:   Labs: Results for orders placed or performed during the hospital encounter of 11/01/14 (from the past 48 hour(s))  Hepatic function panel Once     Status: Abnormal   Collection Time: 11/03/14  1:07 PM  Result Value Ref Range   Total Protein 6.5 6.5 - 8.1 g/dL   Albumin 1.9 (L) 3.5 - 5.0 g/dL   AST 77 (H) 15 - 41 U/L   ALT 32 17 - 63 U/L   Alkaline Phosphatase 1579 (H) 38 - 126 U/L   Total Bilirubin 6.4 (H) 0.3 - 1.2 mg/dL   Bilirubin, Direct 3.9 (H) 0.1 - 0.5 mg/dL   Indirect Bilirubin 2.5 (H) 0.3 - 0.9 mg/dL  Comprehensive metabolic panel     Status: Abnormal   Collection Time: 11/04/14  5:00 AM  Result Value Ref Range   Sodium 131 (L) 135 -  145 mmol/L   Potassium 3.4 (L) 3.5 - 5.1 mmol/L   Chloride 99 (L) 101 - 111 mmol/L   CO2 21 (L) 22 - 32 mmol/L   Glucose, Bld 87 65 - 99 mg/dL   BUN 7 6 - 20 mg/dL   Creatinine, Ser 0.45 (L) 0.61 - 1.24 mg/dL   Calcium 7.9 (L) 8.9 - 10.3 mg/dL   Total Protein 6.3 (L) 6.5 - 8.1 g/dL   Albumin 1.9 (L) 3.5 - 5.0 g/dL   AST 76 (H) 15 - 41 U/L   ALT 29 17 - 63 U/L   Alkaline Phosphatase 1537 (H) 38 - 126 U/L   Total Bilirubin 6.5 (H) 0.3 - 1.2 mg/dL   GFR calc non Af Amer >60 >60 mL/min   GFR calc Af Amer >60 >60 mL/min    Comment: (NOTE) The eGFR has been calculated using the CKD EPI equation. This calculation has not been validated in all clinical situations. eGFR's persistently <60 mL/min signify possible Chronic Kidney Disease.    Anion gap 11 5 - 15  CBC     Status: Abnormal   Collection Time: 11/04/14  5:00 AM  Result Value Ref Range   WBC 6.2 4.0 - 10.5 K/uL   RBC 3.04 (L) 4.22 - 5.81 MIL/uL   Hemoglobin 8.8 (L) 13.0 - 17.0 g/dL   HCT 27.1 (L) 39.0 - 52.0 %   MCV 89.1 78.0 - 100.0 fL   MCH 28.9 26.0 - 34.0 pg   MCHC 32.5 30.0 - 36.0 g/dL   RDW 24.2 (H) 11.5 - 15.5 %   Platelets 453 (H) 150 - 400 K/uL  Glucose, capillary     Status: None   Collection Time: 11/04/14  7:22 AM  Result Value Ref Range   Glucose-Capillary 90 65 - 99 mg/dL  Glucose, capillary     Status: None   Collection Time: 11/05/14  7:26 AM  Result Value Ref Range   Glucose-Capillary 85 65 - 99 mg/dL    Imaging / Studies: Dg Abd 1 View  11/04/2014   CLINICAL DATA:  Follow-up of small bowel obstruction, clinically improved but persistent abdominal distention  EXAM: ABDOMEN - 1 VIEW  COMPARISON:  Abdominal film of November 02, 2014  FINDINGS: The pigtail catheter on the right has been removed. There remain loops of mildly distended gas-filled small bowel in the mid abdomen. There is gas in the pelvis likely in the rectum. The esophagogastric tube's proximal port lies at or just below the GE junction. The tip  lies in the gastric cardia. The bony structures are unremarkable. There are no abnormal soft tissue calcifications.  IMPRESSION: 1. Persistent distention of small-bowel loops in the mid abdomen consistent with a mid to distal small bowel obstruction. 2. Advancement of the nasogastric tube by 10 cm is recommended to assure that the proximal port lies below the GE junction.   Electronically Signed   By: Davaun  Martinique M.D.   On: 11/04/2014 09:12   Ir Sinus/fist Tube Chk-non Gi  11/04/2014   CLINICAL DATA:  Follow-up abdominal abscess drain. No significant drainage from the catheter and no obvious collection on recent CT imaging. 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  EXAM: DRAIN INJECTION WITH FLUOROSCOPY  Physician: Stephan Minister. Henn, MD  FLUOROSCOPY TIME:  24 seconds, 6.3 mGy  MEDICATIONS AND MEDICAL HISTORY: None  ANESTHESIA/SEDATION: Moderate sedation time: None  CONTRAST:  10 mL Omnipaque-300  PROCEDURE: Patient was placed supine on the interventional table. Contrast was injected through the right abdominal drain. The catheter suture was removed. The catheter was cut and completely removed. Bandage placed at the old drain site.  FINDINGS: Injection of contrast demonstrates reflux around the catheter and drainage onto the skin. There is no evidence for an abscess collection. No evidence for a fistula connection to bowel.  COMPLICATIONS: None  IMPRESSION: Negative for a residual abscess cavity and negative for a fistula. As a result, the drain was completely removed.   Electronically Signed   By: Markus Daft M.D.   On: 11/04/2014 08:40    Medications / Allergies:  Scheduled Meds: . bisacodyl  10 mg Rectal BID  . pantoprazole (PROTONIX) IV  40 mg Intravenous Q12H  . primidone  250 mg Oral q morning - 10a  . propranolol  10 mg Oral BID  . saccharomyces boulardii  250 mg Oral BID  . senna  1 tablet Oral Daily  . sucralfate  1 g Oral TID WC & HS   Continuous Infusions: . sodium chloride 75 mL/hr at 11/05/14  0424   PRN Meds:.acetaminophen **OR** acetaminophen, HYDROmorphone (DILAUDID) injection, ondansetron **OR** ondansetron (ZOFRAN) IV, sodium chloride  Antibiotics: Anti-infectives  Start     Dose/Rate Route Frequency Ordered Stop   11/01/14 1800  ciprofloxacin (CIPRO) IVPB 400 mg  Status:  Discontinued     400 mg 200 mL/hr over 60 Minutes Intravenous Every 12 hours 11/01/14 1707 11/04/14 0805   11/01/14 1800  metroNIDAZOLE (FLAGYL) IVPB 500 mg  Status:  Discontinued     500 mg 100 mL/hr over 60 Minutes Intravenous Every 8 hours 11/01/14 1707 11/04/14 0805       Assessment/Plan Recurrent small bowel obstruction Abdominal carcinomatosis  -D#4 of conservative management.  NGT clamped, intermittent crampy abdominal pain associated with nausea, flatus, no BM.  Dr. Hassell Done has discussed surgery and has offered surgery.  Patient wishes to proceed.  Timing not known, but likely tomorrow.  I will start him on TPN.  I updated Paula wiht GI.  -mobilize, IV hydration, pain control and ant-emetics. 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 GI on board. Plan for ERCP once SBO improves versus perc drain.   Hx exploratory laparotomy resection TI and Rt colon, small bowel bypass complicated by post op abscess---Dr. Excell Seltzer 09/09/14--resolved PCM-LFTS   Erby Pian, ANP-BC [facility] Surgery Pager [phone](7A-4:30P)   11/05/2014 9:12 AM

## 2014-11-05 NOTE — Anesthesia Preprocedure Evaluation (Addendum)
Anesthesia Evaluation  Patient identified by MRN, date of birth, ID band Patient awake    Reviewed: Allergy & Precautions, NPO status , Patient's Chart, lab work & pertinent test results  Airway Mallampati: II  TM Distance: >3 FB Neck ROM: Full    Dental no notable dental hx.    Pulmonary pneumonia -,  breath sounds clear to auscultation  Pulmonary exam normal       Cardiovascular hypertension, + Peripheral Vascular Disease Normal cardiovascular examRhythm:Regular Rate:Normal     Neuro/Psych negative neurological ROS  negative psych ROS   GI/Hepatic negative GI ROS, Neg liver ROS,   Endo/Other  negative endocrine ROS  Renal/GU negative Renal ROS  negative genitourinary   Musculoskeletal negative musculoskeletal ROS (+)   Abdominal   Peds negative pediatric ROS (+)  Hematology negative hematology ROS (+) anemia ,   Anesthesia Other Findings   Reproductive/Obstetrics negative OB ROS                            Anesthesia Physical Anesthesia Plan  ASA: III  Anesthesia Plan: General   Post-op Pain Management:    Induction: Intravenous, Rapid sequence and Cricoid pressure planned  Airway Management Planned: Oral ETT  Additional Equipment:   Intra-op Plan:   Post-operative Plan: Extubation in OR  Informed Consent: I have reviewed the patients History and Physical, chart, labs and discussed the procedure including the risks, benefits and alternatives for the proposed anesthesia with the patient or authorized representative who has indicated his/her understanding and acceptance.   Dental advisory given  Plan Discussed with: CRNA and Surgeon  Anesthesia Plan Comments:        Anesthesia Quick Evaluation

## 2014-11-05 NOTE — Progress Notes (Signed)
[facility] PROGRESS NOTE  Wesley Dillon ZSW:[id number] DOB: 07-Apr-1946 DOA: 11/01/2014 PCP: Gerrit Heck, MD Interim summary: Wesley Dillon is 69 year old gentleman with prior h/o abdominal carcinomatosis , presents with recurrent sbo. He was last admitted a week ago.   HPI/Subjective: Has occasional crampy abdominal pain which improves after IV pain medication. Passing gas. Abdomen still distended.   Assessment/Plan: 1. Recurrent SBO: - due to cancer mentioned below Not improving, NG tube placed- Xray still shows no improvement-  Surgery consulted - considering a venting g tube for palliative purposes- TPN being started by surgery  2. Abdominal carcinomatosis with unknown primary:  - s/p 4 cycles of chemo, followed by expl lap in 08/2014.  Further management as per oncology.   3. Elevated alk phos and elevated bilirubin:  MRI showed intra and extra hepatobiliary ductal dilatation.  Requested GI consult and plan for ERCP with palliative stent when SBO improves.  4. SEVERE Protein calorie malnutrition: - nutrition consulted  5. Hyponatremia  - cont to follow with hydration  6. Hypokalemia/ hypomagnesemia  - replacing  Code Status: FULL CODE  Family Communication: none at bedside.  Disposition Plan: remain inpatient.    Consultants:  Oncology  Surgery  GI  Procedures:    Antibiotics:     Objective: Filed Vitals:   11/05/14 1255  BP: 136/80  Pulse: 69  Temp: 97.8 F (36.6 C)  Resp: 18    Intake/Output Summary (Last 24 hours) at 11/05/14 1345 Last data filed at 11/05/14 0717  Gross per 24 hour  Intake   1447 ml  Output   1500 ml  Net    -53 ml   Filed Weights   11/02/14 0355 11/03/14 0535 11/05/14 0428  Weight: 69.8 kg (153 lb 14.1 oz) 64.8 kg (142 lb 13.7 oz) 67.3 kg (148 lb 5.9 oz)    Exam:   General:  Alert  Comfortable.   Cardiovascular: s1s2  Respiratory: diminished breath sounds at bases.   Abdomen: firm,  distended bowel sounds feeble  Musculoskeletal: no pedal edema , cyanosis or clubbing.   Data Reviewed: Basic Metabolic Panel:  Recent Labs Lab 11/01/14 0847 11/01/14 1715 11/02/14 0345 11/04/14 0500 11/05/14 0915  NA 129* 130* 128* 131* 133*  K 3.6 3.7 3.5 3.4* 2.9*  CL 95* 97* 96* 99* 102  CO2 23 24 23  21* 22  GLUCOSE 108* 100* 99 87 102*  BUN 9 9 10 7 6   CREATININE 0.47* 0.59* 0.53* 0.45* 0.39*  CALCIUM 8.3* 8.3* 8.1* 7.9* 7.8*  MG  --  1.4*  --   --  1.5*  PHOS  --  3.5  --   --  2.5   Liver Function Tests:  Recent Labs Lab 11/01/14 0847 11/01/14 1715 11/03/14 1307 11/04/14 0500 11/05/14 0915  AST 79* 73* 77* 76* 87*  ALT 32 31 32 29 33  ALKPHOS 1446* 1298* 1579* 1537* 1714*  BILITOT 5.0* 5.3* 6.4* 6.5* 7.8*  PROT 7.2 7.0 6.5 6.3* 6.8  ALBUMIN 2.1* 2.1* 1.9* 1.9* 2.0*   No results for input(s): LIPASE, AMYLASE in the last 168 hours. No results for input(s): AMMONIA in the last 168 hours. CBC:  Recent Labs Lab 11/01/14 0847 11/01/14 1715 11/02/14 0345 11/04/14 0500  WBC 8.2 8.2 6.9 6.2  NEUTROABS 5.8 5.4  --   --   HGB 9.8* 9.5* 8.7* 8.8*  HCT 30.4* 29.3* 27.3* 27.1*  MCV 88.6 89.1 90.4 89.1  PLT 565* 521* 502* 453*   Cardiac Enzymes:  No results for input(s): CKTOTAL, CKMB, CKMBINDEX, TROPONINI in the last 168 hours. BNP (last 3 results)  Recent Labs  10/06/14 0545 10/11/14 0802  BNP 574.2* 223.3*    ProBNP (last 3 results) No results for input(s): PROBNP in the last 8760 hours.  CBG:  Recent Labs Lab 11/03/14 0749 11/04/14 0722 11/05/14 0726 11/05/14 1147  GLUCAP 102* 90 85 84    No results found for this or any previous visit (from the past 240 hour(s)).   Studies: Dg Abd 1 View  11/04/2014   CLINICAL DATA:  Follow-up of small bowel obstruction, clinically improved but persistent abdominal distention  EXAM: ABDOMEN - 1 VIEW  COMPARISON:  Abdominal film of November 02, 2014  FINDINGS: The pigtail catheter on the right has been  removed. There remain loops of mildly distended gas-filled small bowel in the mid abdomen. There is gas in the pelvis likely in the rectum. The esophagogastric tube's proximal port lies at or just below the GE junction. The tip lies in the gastric cardia. The bony structures are unremarkable. There are no abnormal soft tissue calcifications.  IMPRESSION: 1. Persistent distention of small-bowel loops in the mid abdomen consistent with a mid to distal small bowel obstruction. 2. Advancement of the nasogastric tube by 10 cm is recommended to assure that the proximal port lies below the GE junction.   Electronically Signed   By: Dayven  Martinique M.D.   On: 11/04/2014 09:12   Ir Sinus/fist Tube Chk-non Gi  11/04/2014   CLINICAL DATA:  Follow-up abdominal abscess drain. No significant drainage from the catheter and no obvious collection on recent CT imaging. 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  EXAM: DRAIN INJECTION WITH FLUOROSCOPY  Physician: Stephan Minister. Henn, MD  FLUOROSCOPY TIME:  24 seconds, 6.3 mGy  MEDICATIONS AND MEDICAL HISTORY: None  ANESTHESIA/SEDATION: Moderate sedation time: None  CONTRAST:  10 mL Omnipaque-300  PROCEDURE: Patient was placed supine on the interventional table. Contrast was injected through the right abdominal drain. The catheter suture was removed. The catheter was cut and completely removed. Bandage placed at the old drain site.  FINDINGS: Injection of contrast demonstrates reflux around the catheter and drainage onto the skin. There is no evidence for an abscess collection. No evidence for a fistula connection to bowel.  COMPLICATIONS: None  IMPRESSION: Negative for a residual abscess cavity and negative for a fistula. As a result, the drain was completely removed.   Electronically Signed   By: Markus Daft M.D.   On: 11/04/2014 08:40    Scheduled Meds: . [MAR Hold] bisacodyl  10 mg Rectal BID  . [START ON 11/06/2014]  ceFAZolin (ANCEF) IV  2 g Intravenous On Call to OR  . ciprofloxacin   400 mg Intravenous Once  . [START ON 11/06/2014] ciprofloxacin  400 mg Intravenous Q12H  . indomethacin  100 mg Rectal Once  . [MAR Hold] insulin aspart  0-9 Units Subcutaneous Q6H  . magnesium sulfate 1 - 4 g bolus IVPB  2 g Intravenous Once  . [MAR Hold] pantoprazole (PROTONIX) IV  40 mg Intravenous Q12H  . [MAR Hold] potassium chloride  10 mEq Intravenous Q1 Hr x 6  . potassium phosphate IVPB (mmol)  10 mmol Intravenous Once  . [MAR Hold] primidone  250 mg Oral q morning - 10a  . [MAR Hold] propranolol  10 mg Oral BID  . [MAR Hold] saccharomyces boulardii  250 mg Oral BID  . [MAR Hold] senna  1 tablet Oral Daily  . [  MAR Hold] sucralfate  1 g Oral TID WC & HS   Continuous Infusions: . sodium chloride 75 mL/hr at 11/05/14 0424  . sodium chloride    . Marland KitchenTPN (CLINIMIX-E) Adult     And  . fat emulsion      Time spent: 35 minutes.     [facility]  [email or web address], password [facility] 11/05/2014, 1:45 PM  LOS: 4 days

## 2014-11-05 NOTE — Progress Notes (Signed)
    Progress Note   Subjective  Feels okay. Gets nauseated and pain if NGT clamped for too long   Objective   Vital signs in last 24 hours: Temp:  [97.7 F (36.5 C)-98.4 F (36.9 C)] 98.4 F (36.9 C) (07/14 0428) Pulse Rate:  [73-78] 77 (07/14 0428) Resp:  [18-20] 18 (07/14 0428) BP: (113-120)/(57-73) 118/73 mmHg (07/14 0428) SpO2:  [98 %-100 %] 98 % (07/14 0428) Weight:  [148 lb 5.9 oz (67.3 kg)] 148 lb 5.9 oz (67.3 kg) (07/14 0428) Last BM Date: 11/03/14 General:    Pleasant white male in NAD Abdomen:  Soft, moderate distended. Tympanitic on top but more flat to percussion on sides. Active bowel sounds. Extremities:  Without edema. Neurologic:  Alert and oriented,  grossly normal neurologically. Psych:  Cooperative. Normal mood and affect.  Intake/Output from previous day: 07/13 0701 - 07/14 0700 In: 1684 [P.O.:814; I.V.:870] Out: 1840 [Urine:375; Emesis/NG WUJWJX:9147] Intake/Output this shift: Total I/O In: -  Out: 300 [Urine:125; Emesis/NG output:175]  Lab Results:  Recent Labs  11/04/14 0500  WBC 6.2  HGB 8.8*  HCT 27.1*  PLT 453*   BMET  Recent Labs  11/04/14 0500  NA 131*  K 3.4*  CL 99*  CO2 21*  GLUCOSE 87  BUN 7  CREATININE 0.45*  CALCIUM 7.9*   LFT  Recent Labs  11/03/14 1307 11/04/14 0500  PROT 6.5 6.3*  ALBUMIN 1.9* 1.9*  AST 77* 76*  ALT 32 29  ALKPHOS 1579* 1537*  BILITOT 6.4* 6.5*  BILIDIR 3.9*  --   IBILI 2.5*  --       Assessment / Plan:    Carcinomatosis with bowel and biliary obstruction. Now admitted with recurrent SBO. Bowel obstruction improving, passing flatus now and slightly less distended. Dr. Hassell Done taking him to OR, probably tomorrow. Patient still at increased risk of procedure related complications but given improvement in SBO we will proceed with ERCP today     LOS: 4 days   Tye Savoy  11/05/2014, 10:33 AM

## 2014-11-05 NOTE — Progress Notes (Signed)
Nutrition Follow-up  DOCUMENTATION CODES:   Severe malnutrition in context of chronic illness  INTERVENTION:   TPN per Pharmacy Monitor magnesium, potassium, and phosphorus daily for at least 3 days, MD to replete as needed, as pt is at risk for refeeding syndrome given severe malnutrition status.  Diet advancement per MD RD to continue to monitor  NUTRITION DIAGNOSIS:   Inadequate oral intake related to inability to eat as evidenced by NPO status.  Ongoing.  GOAL:   Patient will meet greater than or equal to 90% of their needs  Unmet.  MONITOR:   Diet advancement, Labs, Weight trends, I & O's, Other (Comment) (TPN)  REASON FOR ASSESSMENT:   Consult New TPN/TNA  ASSESSMENT:   69 year old with recent diagnosis of abdominal carcinomatosis (omental biopsy confirmed the adenocarcinoma 07/01/2014). He presents with increased abdominal pain, nausea and vomiting which started yesterday at approximately noon. He was recently admitted to the hospital for a high-grade bowel obstruction. This resolved nonsurgically and the patient was discharged. He follows with oncology secondary to a history of a recently diagnosed metastatic cancer, unclear primary, metastatic disease to the lungs as well as carcinomatosis of the abdomen.  Pt currently in endo for ERCP.  TPN to be initiated today. Estimated needs from initial assessment provided below. Per weight history documentation, pt has lost 26 lb since 6/27 (15% weight loss x 2.5 weeks, significant for time frame).  Pharmacy note pending.  Labs reviewed: Low Na, K, Creatinine  Diet Order:  Diet NPO time specified  Skin:  Reviewed, no issues  Last BM:  7/12  Height:   Ht Readings from Last 1 Encounters:  11/01/14 6\' 1"  (1.854 m)    Weight:   Wt Readings from Last 1 Encounters:  11/05/14 148 lb 5.9 oz (67.3 kg)    Ideal Body Weight:  83.6 kg (kg)  Wt Readings from Last 10 Encounters:  11/05/14 148 lb 5.9 oz (67.3 kg)   10/27/14 157 lb 14.4 oz (71.623 kg)  10/24/14 164 lb 11.2 oz (74.707 kg)  10/19/14 174 lb 14.4 oz (79.334 kg)  10/13/14 179 lb 9.6 oz (81.466 kg)  10/12/14 174 lb 6.1 oz (79.1 kg)  10/07/14 179 lb 3.7 oz (81.3 kg)  10/05/14 173 lb 6.4 oz (78.654 kg)  09/23/14 170 lb 9.6 oz (77.384 kg)  09/06/14 170 lb 14.4 oz (77.52 kg)    BMI:  Body mass index is 19.58 kg/(m^2).  Estimated Nutritional Needs:   Kcal:  1601-0932  Protein:  75-85 grams  Fluid:  2.2 L/day  EDUCATION NEEDS:   No education needs identified at this time  [name] Bibles, MS, RD, LDN Pager: [phone] After Hours Pager: [phone]

## 2014-11-05 NOTE — Progress Notes (Signed)
Peripherally Inserted Central Catheter/Midline Placement  The IV Nurse has discussed with the patient and/or persons authorized to consent for the patient, the purpose of this procedure and the potential benefits and risks involved with this procedure.  The benefits include less needle sticks, lab draws from the catheter and patient may be discharged home with the catheter.  Risks include, but not limited to, infection, bleeding, blood clot (thrombus formation), and puncture of an artery; nerve damage and irregular heat beat.  Alternatives to this procedure were also discussed.  PICC/Midline Placement Documentation        Darlyn Read 11/05/2014, 4:21 PM

## 2014-11-05 NOTE — Interval H&P Note (Signed)
History and Physical Interval Note:  11/05/2014 1:05 PM  Wesley Dillon  has presented today for surgery, with the diagnosis of malignant biliary obstruction  The various methods of treatment have been discussed with the patient and family. After consideration of risks, benefits and other options for treatment, the patient has consented to  Procedure(s): ENDOSCOPIC RETROGRADE CHOLANGIOPANCREATOGRAPHY (ERCP) (N/A) as a surgical intervention .  The patient's history has been reviewed, patient examined, no change in status, stable for surgery.  I have reviewed the patient's chart and labs.  Questions were answered to the patient's satisfaction.     Pricilla Riffle. Fuller Plan MD

## 2014-11-05 NOTE — Op Note (Addendum)
[facility] [place], [address]   ERCP PROCEDURE REPORT        EXAM DATE: 11/05/2014  PATIENT NAME:          Sharon, Stapel          MR #: [id number] BIRTHDATE:       02-06-1946     VISIT #:     [phone] ATTENDING:     Ladene Artist, MD, [facility]     STATUS:     inpatient  ASSISTANT:      Cletis Athens and Salli Real J  INDICATIONS:  The patient is a 69 yr old male here for an ERCP due to bile duct stricture, abnormal liver function tests, elevated bilirubin, jaundice and an abnormal MRCP. PROCEDURE PERFORMED:     ERCP with biliary stent placement  MEDICATIONS:     Per Anesthesia CONSENT: The patient understands the risks and benefits of the procedure and understands that these risks include, but are not limited to: sedation, allergic reaction, infection, perforation and/or bleeding. Alternative means of evaluation and treatment include, among others: physical exam, x-rays, and/or surgical intervention. The patient elects to proceed with this endoscopic procedure. DESCRIPTION OF PROCEDURE: During intra-op preparation period all mechanical & medical equipment was checked for proper function. Hand hygiene and appropriate measures for infection prevention was taken. After the risks, benefits and alternatives of the procedure were thoroughly explained, Informed was verified, confirmed and timeout was successfully executed by the treatment team. With the patient in left semi-prone position, medications were administered intravenously.The Pentax Ercp Scope [id number] was passed from the mouth into the esophagus and further advanced from the esophagus into the stomach. From stomach scope was directed to the second portion of the duodenum.  Major papilla was aligned with the duodenoscope. The scope position was confirmed fluoroscopically. Rest of the findings/therapeutics are given below. Minimal insufflation performed due to partial SBO.  The scope was then completely withdrawn from the patient and the procedure completed. The pulse, BP, and O2 saturation were monitored and documented by the physician and the nursing staff throughout the entire procedure. The patient was cared for as planned according to standard protocol. The patient was then discharged to recovery in stable condition and with appropriate post procedure care. Estimated blood loss is zero unless otherwise noted in this procedure report.  CBD was freely cannulated and a guidewire was advanced easily into the left intrahepatic ducts. Contrast injected and a severe, stricture was noted in the mid common bile duct.  Under endoscopic and fluoroscopic guidance, a 36mm x 8cm uncovered metal stent was placed in the bile duct across the stricture and across the ampulla.  Excellent drainage of dark bile noted. There was dilation of the left intrahepatic ducts, the proxmial CBD and the CHD. The right intrahepatic ducts did not fill well with injection of contrast. The distal CBD appeared normal. PD was not cannulated or injected by intention. Given goals of limiting insufflation no additional attempts were made to better fill the right intrahepatic system or to reposition the guidewire into the right system.  The ampulla was located in the second portion of the duodenum.  The ampulla appeared normal.    ADVERSE EVENT:     There were no complications. IMPRESSIONS:     1.  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  Proximal biliary dilation  RECOMMENDATIONS:     Trend liver enzymes   Pricilla Riffle  Fuller Plan, MD, [facility] eSigned:  Ladene Artist, MD, [facility] 11/06/2014 12:14 PM Revised: 11/06/2014 12:14 PM  cc: Arturo Morton, M.D.

## 2014-11-05 NOTE — Anesthesia Postprocedure Evaluation (Signed)
  Anesthesia Post-op Note  Patient: Wesley Dillon  Procedure(s) Performed: Procedure(s) (LRB): ENDOSCOPIC RETROGRADE CHOLANGIOPANCREATOGRAPHY (ERCP) (N/A)  Patient Location: PACU  Anesthesia Type: General  Level of Consciousness: awake and alert   Airway and Oxygen Therapy: Patient Spontanous Breathing  Post-op Pain: mild  Post-op Assessment: Post-op Vital signs reviewed, Patient's Cardiovascular Status Stable, Respiratory Function Stable, Patent Airway and No signs of Nausea or vomiting  Last Vitals:  Filed Vitals:   11/05/14 1409  BP:   Pulse:   Temp: 36.5 C  Resp: 12    Post-op Vital Signs: stable   Complications: No apparent anesthesia complications

## 2014-11-05 NOTE — Transfer of Care (Signed)
Immediate Anesthesia Transfer of Care Note  Patient: Wesley Dillon  Procedure(s) Performed: Procedure(s): ENDOSCOPIC RETROGRADE CHOLANGIOPANCREATOGRAPHY (ERCP) (N/A)  Patient Location: PACU  Anesthesia Type:General  Level of Consciousness: sedated  Airway & Oxygen Therapy: Patient Spontanous Breathing and Patient connected to face mask oxygen  Post-op Assessment: Report given to RN and Post -op Vital signs reviewed and stable  Post vital signs: Reviewed and stable  Last Vitals:  Filed Vitals:   11/05/14 1255  BP: 136/80  Pulse: 69  Temp: 36.6 C  Resp: 18    Complications: No apparent anesthesia complications

## 2014-11-06 ENCOUNTER — Inpatient Hospital Stay (HOSPITAL_COMMUNITY): Payer: PPO | Admitting: Anesthesiology

## 2014-11-06 ENCOUNTER — Encounter (HOSPITAL_COMMUNITY): Payer: Self-pay | Admitting: Anesthesiology

## 2014-11-06 ENCOUNTER — Encounter (HOSPITAL_COMMUNITY)
Admission: EM | Disposition: A | Discharge status: Hospice | Payer: Self-pay | Source: Home / Self Care | Attending: Internal Medicine

## 2014-11-06 ENCOUNTER — Ambulatory Visit (HOSPITAL_COMMUNITY): Admission: RE | Admit: 2014-11-06 | Payer: PPO | Source: Ambulatory Visit | Admitting: Internal Medicine

## 2014-11-06 HISTORY — PX: LAPAROTOMY: SHX154

## 2014-11-06 HISTORY — PX: GASTROSTOMY: SHX5249

## 2014-11-06 LAB — CBC
HCT: 25 % — ABNORMAL LOW (ref 39.0–52.0)
Hemoglobin: 8.3 g/dL — ABNORMAL LOW (ref 13.0–17.0)
MCH: 29.6 pg (ref 26.0–34.0)
MCHC: 33.2 g/dL (ref 30.0–36.0)
MCV: 89.3 fL (ref 78.0–100.0)
Platelets: 361 10*3/uL (ref 150–400)
RBC: 2.8 MIL/uL — ABNORMAL LOW (ref 4.22–5.81)
RDW: 23.5 % — ABNORMAL HIGH (ref 11.5–15.5)
WBC: 5.2 10*3/uL (ref 4.0–10.5)

## 2014-11-06 LAB — PREALBUMIN: Prealbumin: 6.3 mg/dL — ABNORMAL LOW (ref 18–38)

## 2014-11-06 LAB — COMPREHENSIVE METABOLIC PANEL
ALT: 23 U/L (ref 17–63)
AST: 54 U/L — ABNORMAL HIGH (ref 15–41)
Albumin: 1.8 g/dL — ABNORMAL LOW (ref 3.5–5.0)
Alkaline Phosphatase: 1333 U/L — ABNORMAL HIGH (ref 38–126)
Anion gap: 4 — ABNORMAL LOW (ref 5–15)
BUN: 6 mg/dL (ref 6–20)
CHLORIDE: 102 mmol/L (ref 101–111)
CO2: 23 mmol/L (ref 22–32)
Calcium: 7.3 mg/dL — ABNORMAL LOW (ref 8.9–10.3)
Creatinine, Ser: 0.48 mg/dL — ABNORMAL LOW (ref 0.61–1.24)
GFR calc Af Amer: >60 mL/min (ref >60)
GFR calc non Af Amer: >60 mL/min (ref >60)
GLUCOSE: 130 mg/dL — AB (ref 65–99)
Potassium: 3.5 mmol/L (ref 3.5–5.1)
SODIUM: 129 mmol/L — AB (ref 135–145)
TOTAL PROTEIN: 5.9 g/dL — AB (ref 6.5–8.1)
Total Bilirubin: 3.9 mg/dL — ABNORMAL HIGH (ref 0.3–1.2)

## 2014-11-06 LAB — DIFFERENTIAL
BASOS PCT: 0 % (ref 0–1)
Basophils Absolute: 0 10*3/uL (ref 0.0–0.1)
Eosinophils Absolute: 0.1 10*3/uL (ref 0.0–0.7)
Eosinophils Relative: 1 % (ref 0–5)
Lymphocytes Relative: 23 % (ref 12–46)
Lymphs Abs: 1.2 10*3/uL (ref 0.7–4.0)
MONOS PCT: 11 % (ref 3–12)
Monocytes Absolute: 0.6 10*3/uL (ref 0.1–1.0)
NEUTROS ABS: 3.3 10*3/uL (ref 1.7–7.7)
Neutrophils Relative %: 65 % (ref 43–77)

## 2014-11-06 LAB — GLUCOSE, CAPILLARY
GLUCOSE-CAPILLARY: 107 mg/dL — AB (ref 65–99)
GLUCOSE-CAPILLARY: 137 mg/dL — AB (ref 65–99)
Glucose-Capillary: 114 mg/dL — ABNORMAL HIGH (ref 65–99)
Glucose-Capillary: 123 mg/dL — ABNORMAL HIGH (ref 65–99)

## 2014-11-06 LAB — TRIGLYCERIDES: Triglycerides: 136 mg/dL (ref <150)

## 2014-11-06 LAB — TYPE AND SCREEN
ABO/RH(D): B POS
Antibody Screen: NEGATIVE

## 2014-11-06 LAB — MAGNESIUM: MAGNESIUM: 1.7 mg/dL (ref 1.7–2.4)

## 2014-11-06 LAB — PHOSPHORUS: PHOSPHORUS: 2.9 mg/dL (ref 2.5–4.6)

## 2014-11-06 SURGERY — LAPAROTOMY, EXPLORATORY
Anesthesia: General

## 2014-11-06 MED ORDER — BUPIVACAINE LIPOSOME 1.3 % IJ SUSP
20.0000 mL | Freq: Once | INTRAMUSCULAR | Status: AC
  Administered 2014-11-06: 20 mL
  Filled 2014-11-06: qty 20

## 2014-11-06 MED ORDER — FAT EMULSION 20 % IV EMUL
250.0000 mL | INTRAVENOUS | Status: DC
  Administered 2014-11-06: 250 mL via INTRAVENOUS
  Filled 2014-11-06: qty 250

## 2014-11-06 MED ORDER — MAGNESIUM SULFATE 2 GM/50ML IV SOLN
2.0000 g | Freq: Once | INTRAVENOUS | Status: AC
  Administered 2014-11-06: 2 g via INTRAVENOUS
  Filled 2014-11-06: qty 50

## 2014-11-06 MED ORDER — PROPOFOL 10 MG/ML IV BOLUS
INTRAVENOUS | Status: DC | PRN
  Administered 2014-11-06: 100 mg via INTRAVENOUS

## 2014-11-06 MED ORDER — SUCCINYLCHOLINE CHLORIDE 20 MG/ML IJ SOLN
INTRAMUSCULAR | Status: DC | PRN
  Administered 2014-11-06: 100 mg via INTRAVENOUS

## 2014-11-06 MED ORDER — LIDOCAINE HCL (CARDIAC) 20 MG/ML IV SOLN
INTRAVENOUS | Status: AC
  Filled 2014-11-06: qty 5

## 2014-11-06 MED ORDER — HEPARIN SODIUM (PORCINE) 5000 UNIT/ML IJ SOLN
5000.0000 [IU] | Freq: Three times a day (TID) | INTRAMUSCULAR | Status: DC
  Filled 2014-11-06 (×5): qty 1

## 2014-11-06 MED ORDER — FENTANYL CITRATE (PF) 100 MCG/2ML IJ SOLN
INTRAMUSCULAR | Status: DC | PRN
  Administered 2014-11-06: 175 ug via INTRAVENOUS
  Administered 2014-11-06: 25 ug via INTRAVENOUS

## 2014-11-06 MED ORDER — POTASSIUM PHOSPHATES 15 MMOLE/5ML IV SOLN
15.0000 mmol | Freq: Once | INTRAVENOUS | Status: AC
  Administered 2014-11-06: 15 mmol via INTRAVENOUS
  Filled 2014-11-06: qty 5

## 2014-11-06 MED ORDER — EPHEDRINE SULFATE 50 MG/ML IJ SOLN
INTRAMUSCULAR | Status: DC | PRN
  Administered 2014-11-06: 10 mg via INTRAVENOUS
  Administered 2014-11-06 (×2): 5 mg via INTRAVENOUS

## 2014-11-06 MED ORDER — POTASSIUM CHLORIDE 10 MEQ/50ML IV SOLN
10.0000 meq | INTRAVENOUS | Status: AC
  Administered 2014-11-06 (×4): 10 meq via INTRAVENOUS
  Filled 2014-11-06 (×4): qty 50

## 2014-11-06 MED ORDER — CEFAZOLIN SODIUM-DEXTROSE 2-3 GM-% IV SOLR
INTRAVENOUS | Status: AC
  Filled 2014-11-06: qty 50

## 2014-11-06 MED ORDER — ONDANSETRON HCL 4 MG/2ML IJ SOLN
INTRAMUSCULAR | Status: AC
  Filled 2014-11-06: qty 2

## 2014-11-06 MED ORDER — HEPARIN SODIUM (PORCINE) 5000 UNIT/ML IJ SOLN
5000.0000 [IU] | Freq: Three times a day (TID) | INTRAMUSCULAR | Status: DC
  Filled 2014-11-06 (×6): qty 1

## 2014-11-06 MED ORDER — ONDANSETRON HCL 4 MG/2ML IJ SOLN
INTRAMUSCULAR | Status: DC | PRN
  Administered 2014-11-06: 4 mg via INTRAVENOUS

## 2014-11-06 MED ORDER — ROCURONIUM BROMIDE 100 MG/10ML IV SOLN
INTRAVENOUS | Status: AC
  Filled 2014-11-06: qty 1

## 2014-11-06 MED ORDER — ROCURONIUM BROMIDE 100 MG/10ML IV SOLN
INTRAVENOUS | Status: DC | PRN
  Administered 2014-11-06: 20 mg via INTRAVENOUS
  Administered 2014-11-06: 10 mg via INTRAVENOUS

## 2014-11-06 MED ORDER — GLYCOPYRROLATE 0.2 MG/ML IJ SOLN
INTRAMUSCULAR | Status: DC | PRN
  Administered 2014-11-06: 0.3 mg via INTRAVENOUS

## 2014-11-06 MED ORDER — SODIUM CHLORIDE 0.9 % IV SOLN: INTRAVENOUS | Status: DC

## 2014-11-06 MED ORDER — MORPHINE SULFATE (CONCENTRATE) 10 MG/0.5ML PO SOLN: 10.0000 mg | ORAL | Status: DC | PRN

## 2014-11-06 MED ORDER — 0.9 % SODIUM CHLORIDE (POUR BTL) OPTIME
TOPICAL | Status: DC | PRN
  Administered 2014-11-06: 2000 mL

## 2014-11-06 MED ORDER — FENTANYL CITRATE (PF) 250 MCG/5ML IJ SOLN
INTRAMUSCULAR | Status: AC
  Filled 2014-11-06: qty 5

## 2014-11-06 MED ORDER — LACTATED RINGERS IV SOLN
INTRAVENOUS | Status: DC | PRN
Start: 2014-11-06 — End: 2014-11-06
  Administered 2014-11-06: 07:00:00 via INTRAVENOUS

## 2014-11-06 MED ORDER — NEOSTIGMINE METHYLSULFATE 10 MG/10ML IV SOLN
INTRAVENOUS | Status: DC | PRN
  Administered 2014-11-06: 2 mg via INTRAVENOUS

## 2014-11-06 MED ORDER — FAT EMULSION 20 % INFUSION TNA - OPTIME
INTRAVENOUS | Status: DC | PRN
  Administered 2014-11-06: 10 mL/h via INTRAVENOUS

## 2014-11-06 MED ORDER — PROPOFOL 10 MG/ML IV BOLUS
INTRAVENOUS | Status: AC
  Filled 2014-11-06: qty 20

## 2014-11-06 MED ORDER — TRACE MINERALS CR-CU-MN-SE-ZN 10-1000-500-60 MCG/ML IV SOLN
INTRAVENOUS | Status: DC
  Administered 2014-11-06: 17:00:00 via INTRAVENOUS
  Filled 2014-11-06: qty 1200

## 2014-11-06 MED ORDER — SODIUM CHLORIDE 0.9 % IJ SOLN
INTRAMUSCULAR | Status: DC | PRN
  Administered 2014-11-06: 10 mL via INTRAVENOUS

## 2014-11-06 MED ORDER — SODIUM CHLORIDE 0.9 % IJ SOLN
INTRAMUSCULAR | Status: AC
  Filled 2014-11-06: qty 10

## 2014-11-06 SURGICAL SUPPLY — 51 items
APPLICATOR COTTON TIP 6IN STRL (MISCELLANEOUS) IMPLANT
BLADE EXTENDED COATED 6.5IN (ELECTRODE) IMPLANT
BLADE HEX COATED 2.75 (ELECTRODE) ×3 IMPLANT
CATH FOLEY 2WAY SLVR  5CC 22FR (CATHETERS)
CATH FOLEY 2WAY SLVR 5CC 22FR (CATHETERS) IMPLANT
CATH GASTROSTOMY 24FR (CATHETERS) ×3 IMPLANT
CLOSURE WOUND 1/2 X4 (GAUZE/BANDAGES/DRESSINGS) ×1
COVER MAYO STAND STRL (DRAPES) ×3 IMPLANT
COVER SURGICAL LIGHT HANDLE (MISCELLANEOUS) ×3 IMPLANT
DRAIN CHANNEL 19F RND (DRAIN) IMPLANT
DRAPE LAPAROSCOPIC ABDOMINAL (DRAPES) ×3 IMPLANT
DRAPE WARM FLUID 44X44 (DRAPE) ×3 IMPLANT
ELECT REM PT RETURN 9FT ADLT (ELECTROSURGICAL) ×3
ELECTRODE REM PT RTRN 9FT ADLT (ELECTROSURGICAL) ×1 IMPLANT
EVACUATOR SILICONE 100CC (DRAIN) IMPLANT
GAUZE SPONGE 4X4 12PLY STRL (GAUZE/BANDAGES/DRESSINGS) ×3 IMPLANT
GLOVE BIOGEL M 8.0 STRL (GLOVE) ×3 IMPLANT
GLOVE BIOGEL PI IND STRL 7.0 (GLOVE) ×4 IMPLANT
GLOVE BIOGEL PI INDICATOR 7.0 (GLOVE) ×8
GOWN SPEC L4 XLG W/TWL (GOWN DISPOSABLE) IMPLANT
GOWN STRL REUS W/TWL LRG LVL3 (GOWN DISPOSABLE) IMPLANT
GOWN STRL REUS W/TWL XL LVL3 (GOWN DISPOSABLE) ×9 IMPLANT
KIT BASIN OR (CUSTOM PROCEDURE TRAY) ×3 IMPLANT
LIGASURE IMPACT 36 18CM CVD LR (INSTRUMENTS) IMPLANT
NS IRRIG 1000ML POUR BTL (IV SOLUTION) ×6 IMPLANT
PACK GENERAL/GYN (CUSTOM PROCEDURE TRAY) ×3 IMPLANT
SHEATH PEELAWAY SET 9 (SHEATH) ×3 IMPLANT
SPONGE LAP 18X18 X RAY DECT (DISPOSABLE) IMPLANT
STAPLER VISISTAT 35W (STAPLE) IMPLANT
STRIP CLOSURE SKIN 1/2X4 (GAUZE/BANDAGES/DRESSINGS) ×2 IMPLANT
SUCTION POOLE TIP (SUCTIONS) ×3 IMPLANT
SUT ETHILON 2 0 PS N (SUTURE) ×3 IMPLANT
SUT NOVA 1 T20/GS 25DT (SUTURE) ×6 IMPLANT
SUT PDS AB 1 CTX 36 (SUTURE) IMPLANT
SUT SILK 2 0 (SUTURE) ×2
SUT SILK 2 0 SH (SUTURE) ×12 IMPLANT
SUT SILK 2 0 SH CR/8 (SUTURE) ×3 IMPLANT
SUT SILK 2-0 18XBRD TIE 12 (SUTURE) ×1 IMPLANT
SUT SILK 3 0 (SUTURE) ×2
SUT SILK 3 0 SH CR/8 (SUTURE) ×3 IMPLANT
SUT SILK 3-0 18XBRD TIE 12 (SUTURE) ×1 IMPLANT
SUT VIC AB 3-0 SH 18 (SUTURE) IMPLANT
SUT VICRYL 2 0 18  UND BR (SUTURE)
SUT VICRYL 2 0 18 UND BR (SUTURE) IMPLANT
SYRINGE 10CC LL (SYRINGE) ×3 IMPLANT
SYRINGE 20CC LL (MISCELLANEOUS) ×3 IMPLANT
SYRINGE IRR TOOMEY STRL 70CC (SYRINGE) ×3 IMPLANT
TAPE CLOTH SURG 4X10 WHT LF (GAUZE/BANDAGES/DRESSINGS) ×3 IMPLANT
TOWEL OR 17X26 10 PK STRL BLUE (TOWEL DISPOSABLE) ×3 IMPLANT
TOWEL OR NON WOVEN STRL DISP B (DISPOSABLE) IMPLANT
TRAY FOLEY W/METER SILVER 14FR (SET/KITS/TRAYS/PACK) IMPLANT

## 2014-11-06 NOTE — Op Note (Signed)
Surgeon: Kaylyn Lim, MD, FACS  Asst:  none  Anes:  General   Procedure: Exploratory laparotomy, 24 Fr. Gastrostomy tube placement  Diagnosis: Carcinomatosis peritonei  Complications: none  EBL:   30 cc  Drains: none  Description of Procedure:  The patient was taken to OR 2 at [facility].  After anesthesia was administered and the patient was prepped a timeout was performed.  The upper portion of the prior laparotomy incision was excised.  The abdomen was entered and a large volume of ascites was removed. 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  Nodularity was noted wherever I palpated.  The transvere colon was followed up into the right upper quadrant.  The stomach was found with the use of the NG to instill air into the stomach.  The stomach was actually higher than anticipated.  With NG localization, I created a purse string along the greater curvature.  An introducer #24 peel away was used to introduce a Haylard 24 G tube.  This was introduced into the stomach and the purse string tied down.  It was anchored to the anterior abdominal wall with 2-0 silk laterally and medially.  Following irrigation, the abdomen was closed with interrupted #1 Novafil sutures, burying the knots and staples.  A protruding #1 PDS knot was debulked that was pushing up in the lower part of the incison.  Exparel was used liberally in the incision and G tube site.  The G tube site was anchored to the skin with 2-0 nylon.    The patient tolerated the procedure well and was taken to the PACU in stable condition.     Matt B. Hassell Done, [place], [facility], [place]

## 2014-11-06 NOTE — Transfer of Care (Signed)
Immediate Anesthesia Transfer of Care Note  Patient: Wesley Dillon  Procedure(s) Performed: Procedure(s): EXPLORATORY LAPAROTOMY (N/A) GASTROSTOMY (N/A)  Patient Location: PACU  Anesthesia Type:General  Level of Consciousness: awake, alert  and oriented  Airway & Oxygen Therapy: Patient Spontanous Breathing and Patient connected to face mask oxygen  Post-op Assessment: Report given to RN and Post -op Vital signs reviewed and stable  Post vital signs: Reviewed and stable  Last Vitals:  Filed Vitals:   11/06/14 0615  BP: 111/67  Pulse: 67  Temp: 36.9 C  Resp: 20    Complications: No apparent anesthesia complications

## 2014-11-06 NOTE — Progress Notes (Signed)
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 He was last admitted a week ago.   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 No nausea.   Assessment/Plan: 1. Recurrent SBO: - due to cancer mentioned below Not improving, Surgery consulted - underwent a venting g tube for palliative purposes- due to go home with hospice tomorrow- on TPN per surgery  2. Abdominal carcinomatosis with unknown primary:  - s/p 4 cycles of chemo, followed by expl lap in 08/2014.  - home with hospice tomorrow  3. Elevated alk phos and elevated bilirubin:  MRI showed intra and extra hepatobiliary ductal dilatation.  Underwent ERCP with palliative stent on 7/14  4. SEVERE Protein calorie malnutrition: - nutrition consulted  5. Hyponatremia  - have been given IV NS   6. Hypokalemia/ hypomagnesemia  - replaced  Code Status: FULL CODE  Family Communication: none at bedside.  Disposition Plan: remain inpatient.    Consultants:  Oncology  Surgery  GI   Objective: Filed Vitals:   11/06/14 1742  BP: 112/65  Pulse: 78  Temp: 98.8 F (37.1 C)  Resp: 18    Intake/Output Summary (Last 24 hours) at 11/06/14 1826 Last data filed at 11/06/14 1602  Gross per 24 hour  Intake   2097 ml  Output   1070 ml  Net   1027 ml   Filed Weights   11/03/14 0535 11/05/14 0428 11/06/14 0615  Weight: 64.8 kg (142 lb 13.7 oz) 67.3 kg (148 lb 5.9 oz) 69.2 kg (152 lb 8.9 oz)    Exam:   General:  Alert  Comfortable.   Cardiovascular: s1s2  Respiratory: diminished breath sounds at bases.   Abdomen: firm, distended bowel sounds feeble  Musculoskeletal: no pedal edema , cyanosis or clubbing.   Data Reviewed: Basic Metabolic Panel:  Recent  Labs Lab 11/01/14 1715 11/02/14 0345 11/04/14 0500 11/05/14 0915 11/06/14 0135  NA 130* 128* 131* 133* 129*  K 3.7 3.5 3.4* 2.9* 3.5  CL 97* 96* 99* 102 102  CO2 24 23 21* 22 23  GLUCOSE 100* 99 87 102* 130*  BUN 9 10 7 6 6   CREATININE 0.59* 0.53* 0.45* 0.39* 0.48*  CALCIUM 8.3* 8.1* 7.9* 7.8* 7.3*  MG 1.4*  --   --  1.5* 1.7  PHOS 3.5  --   --  2.5 2.9   Liver Function Tests:  Recent Labs Lab 11/01/14 1715 11/03/14 1307 11/04/14 0500 11/05/14 0915 11/06/14 0135  AST 73* 77* 76* 87* 54*  ALT 31 32 29 33 23  ALKPHOS 1298* 1579* 1537* 1714* 1333*  BILITOT 5.3* 6.4* 6.5* 7.8* 3.9*  PROT 7.0 6.5 6.3* 6.8 5.9*  ALBUMIN 2.1* 1.9* 1.9* 2.0* 1.8*   No results for input(s): LIPASE, AMYLASE in the last 168 hours. No results for input(s): AMMONIA in the last 168 hours. CBC:  Recent Labs Lab 11/01/14 0847 11/01/14 1715 11/02/14 0345 11/04/14 0500 11/06/14 0135  WBC 8.2 8.2 6.9 6.2 5.2  NEUTROABS 5.8 5.4  --   --  3.3  HGB 9.8* 9.5* 8.7* 8.8* 8.3*  HCT 30.4* 29.3* 27.3* 27.1* 25.0*  MCV 88.6 89.1 90.4 89.1 89.3  PLT 565* 521* 502* 453* 361   Cardiac Enzymes: No results for input(s): CKTOTAL, CKMB, CKMBINDEX, TROPONINI in the last 168  hours. BNP (last 3 results)  Recent Labs  10/06/14 0545 10/11/14 0802  BNP 574.2* 223.3*    ProBNP (last 3 results) No results for input(s): PROBNP in the last 8760 hours.  CBG:  Recent Labs Lab 11/05/14 1658 11/05/14 2350 11/06/14 0620 11/06/14 1152 11/06/14 1741  GLUCAP 107* 125* 114* 107* 123*    No results found for this or any previous visit (from the past 240 hour(s)).   Studies: Dg Ercp  11/05/2014   CLINICAL DATA:  Biliary obstruction.  Biliary stent placement.  EXAM: ERCP  TECHNIQUE: Multiple spot images obtained with the fluoroscopic device and submitted for interpretation post-procedure.  COMPARISON:  Abdominal MRI - 11/01/2014 ; CT abdomen pelvis - 10/24/2014  FINDINGS: 8 spot intraoperative radiographic  images of the right upper abdominal quadrant during ERCP and biliary stent placement are provided for review.  Initial image demonstrates an ERCP probe overlying the right upper abdominal quadrant. Subsequent images demonstrate selective cannulation and opacification of the common bile duct.  There is focal narrowing involving the mid aspect of the CBD as was demonstrated on preceding abdominal MRI. It is unclear whether there are ill-defined filling defects the more central aspect of the CBD versus artifact due to underdistention.  Subsequent images demonstrate placement of a metallic biliary stent within the CBD with tip terminating within the duodenum.  There is minimal opacification of the left intrahepatic biliary tree which appears moderately dilated. There is no definitive opacification of the cystic or pancreatic ducts.  IMPRESSION: 1. ERCP with biliary stent placement as above. 2. Persistent focal narrowing involving the mid aspect of the CBD, similar to prior abdominal MRI. Correlation with the operative report is recommended. These images were submitted for radiologic interpretation only. Please see the procedural report for the amount of contrast and the fluoroscopy time utilized.   Electronically Signed   By: Sandi Mariscal M.D.   On: 11/05/2014 15:05    Scheduled Meds: . bisacodyl  10 mg Rectal BID  . insulin aspart  0-9 Units Subcutaneous Q6H  . pantoprazole (PROTONIX) IV  40 mg Intravenous Q12H  . potassium chloride  10 mEq Intravenous Q1 Hr x 4  . potassium phosphate IVPB (mmol)  15 mmol Intravenous Once  . primidone  250 mg Oral q morning - 10a  . propranolol  10 mg Oral BID  . saccharomyces boulardii  250 mg Oral BID  . senna  1 tablet Oral Daily  . sucralfate  1 g Oral TID WC & HS   Continuous Infusions: . Marland KitchenTPN (CLINIMIX-E) Adult 50 mL/hr at 11/06/14 1717   And  . fat emulsion 250 mL (11/06/14 1717)    Time spent: 35 minutes.     [facility]  [email or web address], password [facility] 11/06/2014, 6:26 PM  LOS: 5 days

## 2014-11-06 NOTE — H&P (View-Only) (Signed)
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 He underwent partial colectomy and SBR for carcinomatosis and obstruction in May by Dr Excell Seltzer.  This was complicated by an abscess and anastomotic leak.  This has almost resolved based on drain injection study.  HPI: Pt recently hospitalized for SBO that resolved with bowel rest and no NG.  He reports several days of tolerating a reg diet but then developed recurring nausea, vomiting and abd distention yesterday afternoon.  He presented to the ED today and plain films show dilated loops of small bowel.  WBC normal.  CMET shows albumin of 2.1 and bilirubin of 5 (up from 2.5 five days ago).  Past Medical History  Diagnosis Date  . Tremor     takes Primidone 250 once daily and inderall 160 daily for this-has had it since he was 21  . Pneumonia 06/2012  . Macular degeneration   . Benign essential tremor   . Coronary artery disease   . Anemia 06/30/2014  . 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 Essential hypertension 08/18/2014  . 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 Ascending aortic aneurysm 08/2014  . Sepsis 09/2014  . GERD (gastroesophageal reflux disease)   . Postoperative intra-abdominal abscess 09/2014  . SBO (small bowel obstruction)     recurrent  . Hyperlipidemia   . Hypoalbuminemia 09/2014    Past Surgical History  Procedure Laterality Date  . Tonsillectomy    . Video bronchoscopy Bilateral 04/29/2013    Procedure: VIDEO BRONCHOSCOPY WITHOUT FLUORO;  Surgeon: Collene Gobble, MD;  Location: [facility] ENDOSCOPY;  Service: Cardiopulmonary;  Laterality: Bilateral;  . Laparotomy N/A 09/09/2014    Procedure: EXPLORATORY LAPAROTOMY resection terminal ileum and right colon;  Surgeon: Excell Seltzer, MD;  Location: [facility] ORS;  Service: General;  Laterality: N/A;  . Bowel resection N/A 09/09/2014    Procedure: SMALL BOWEL bypass;  Surgeon: Excell Seltzer, MD;  Location: [facility] ORS;   Service: General;  Laterality: N/A;  . Colostomy revision  09/09/2014    Procedure: COLON RESECTION RIGHT;  Surgeon: Excell Seltzer, MD;  Location: [facility] ORS;  Service: General;;  . Abcess drainage  10/05/2014    intra-abd abscess, drain placed by IR    Family History  Problem Relation Age of Onset  . CAD Father 53    Died MI  . Atrial fibrillation Father   . Asthma Father   . Alzheimer's disease Mother   . Diabetes Mother   . Heart disease Mother   . Atrial fibrillation Mother   . Seizures Son     Died status epilepticus    Social History:  reports that he has never smoked. He has never used smokeless tobacco. He reports that he does not drink alcohol or use illicit drugs.  Allergies: No Known Allergies  Medications: I have reviewed the patient's current medications.  Results for orders placed or performed during the hospital encounter of 11/01/14 (from the past 48 hour(s))  CBC with Differential/Platelet     Status: Abnormal   Collection Time: 11/01/14  8:47 AM  Result Value Ref Range   WBC 8.2 4.0 - 10.5 K/uL   RBC 3.43 (L) 4.22 - 5.81 MIL/uL   Hemoglobin 9.8 (L) 13.0 - 17.0 g/dL   HCT 30.4 (L) 39.0 - 52.0 %   MCV 88.6 78.0 - 100.0 fL   MCH 28.6 26.0 - 34.0 pg   MCHC 32.2 30.0 - 36.0 g/dL   RDW 24.7 (H) 11.5 -  15.5 %   Platelets 565 (H) 150 - 400 K/uL   Neutrophils Relative % 71 43 - 77 %   Lymphocytes Relative 17 12 - 46 %   Monocytes Relative 10 3 - 12 %   Eosinophils Relative 1 0 - 5 %   Basophils Relative 1 0 - 1 %   Neutro Abs 5.8 1.7 - 7.7 K/uL   Lymphs Abs 1.4 0.7 - 4.0 K/uL   Monocytes Absolute 0.8 0.1 - 1.0 K/uL   Eosinophils Absolute 0.1 0.0 - 0.7 K/uL   Basophils Absolute 0.1 0.0 - 0.1 K/uL   RBC Morphology POLYCHROMASIA PRESENT     Comment: TARGET CELLS  Comprehensive metabolic panel     Status: Abnormal   Collection Time: 11/01/14  8:47 AM  Result Value Ref Range   Sodium 129 (L) 135 - 145 mmol/L   Potassium 3.6 3.5 - 5.1 mmol/L   Chloride 95 (L)  101 - 111 mmol/L   CO2 23 22 - 32 mmol/L   Glucose, Bld 108 (H) 65 - 99 mg/dL   BUN 9 6 - 20 mg/dL   Creatinine, Ser 0.47 (L) 0.61 - 1.24 mg/dL   Calcium 8.3 (L) 8.9 - 10.3 mg/dL   Total Protein 7.2 6.5 - 8.1 g/dL   Albumin 2.1 (L) 3.5 - 5.0 g/dL   AST 79 (H) 15 - 41 U/L   ALT 32 17 - 63 U/L   Alkaline Phosphatase 1446 (H) 38 - 126 U/L   Total Bilirubin 5.0 (H) 0.3 - 1.2 mg/dL   GFR calc non Af Amer >60 >60 mL/min   GFR calc Af Amer >60 >60 mL/min    Comment: (NOTE) The eGFR has been calculated using the CKD EPI equation. This calculation has not been validated in all clinical situations. eGFR's persistently <60 mL/min signify possible Chronic Kidney Disease.    Anion gap 11 5 - 15    Dg Abd Acute W/chest  11/01/2014   CLINICAL DATA:  Abdominal pain and distention today. History of recent bowel obstruction an abscess drainage.  EXAM: DG ABDOMEN ACUTE W/ 1V CHEST  COMPARISON:  10/26/2014 radiograph and CT scan 10/24/2014  FINDINGS: The upright chest x-ray demonstrates a stable are poor. There are small bilateral pleural effusions and overlying atelectasis but no infiltrates or edema. The heart is normal in size. The mediastinal and hilar contours are stable.  Two views of the abdomen demonstrate a stable drainage catheter in the region of the right pericolic gutter. Dilated small bowel loops with air-fluid levels suggesting recurrent small bowel obstruction. Persistent ascites.  IMPRESSION: 1. Small bilateral pleural effusions and bibasilar atelectasis. 2. Suspect recurrent small bowel obstruction and ascites.   Electronically Signed   By: Marijo Sanes M.D.   On: 11/01/2014 10:45    Review of Systems  Constitutional: Positive for weight loss and malaise/fatigue. Negative for fever and chills.  Eyes: Negative for blurred vision.  Respiratory: Negative for cough and shortness of breath.   Cardiovascular: Negative for chest pain.  Gastrointestinal: Positive for nausea and vomiting.  Negative for abdominal pain, diarrhea and constipation.  Genitourinary: Negative for dysuria, urgency and frequency.  Skin: Negative for itching and rash.  Neurological: Negative for dizziness and headaches.   Blood pressure 121/68, pulse 80, temperature 98.2 F (36.8 C), temperature source Oral, resp. rate 16, SpO2 100 %. Physical Exam  Constitutional: He is oriented to person, place, and time. No distress.  HENT:  Head: Normocephalic and atraumatic.  Eyes: EOM are  normal. Pupils are equal, round, and reactive to light. Scleral icterus is present.  Neck: Normal range of motion. Neck supple.  Cardiovascular: Normal rate and regular rhythm.   Respiratory: Effort normal and breath sounds normal. No respiratory distress.  GI: Soft. He exhibits distension. There is no tenderness. There is no rebound and no guarding.  Drain in RLQ with minimal clear fluid  Musculoskeletal: Normal range of motion.  Neurological: He is alert and oriented to person, place, and time.  Skin: Skin is warm and dry. He is not diaphoretic.    Assessment/Plan: Pt with SBO.  Resolved last week with bowel rest alone.  We will try this again and repeat plain films in AM to evaluate progression.  If films show worsening dilation, will probably benefit from NG.  If pt's nausea is not controlled with medications, recommend NG as well.   He may need to stay on a full liquid diet once this resolves.  Pt not an operative candidate as he is severely malnourished and has already failed after operative management only 1 month ago.  Pt also jaundiced.  Bilirubin is elevating quickly, although some of this may be due to dehydration.  He is scheduled for MRCP next week.  I have ordered this to be done here instead.  Pt may need GI involvement as well.    Joaopedro Eschbach C. 4/33/2951, 8:84 PM

## 2014-11-06 NOTE — Anesthesia Procedure Notes (Signed)
Procedure Name: Intubation Date/Time: 11/06/2014 7:59 AM Performed by: Glory Buff Pre-anesthesia Checklist: Patient identified, Emergency Drugs available, Suction available and Patient being monitored Patient Re-evaluated:Patient Re-evaluated prior to inductionOxygen Delivery Method: Circle System Utilized Preoxygenation: Pre-oxygenation with 100% oxygen Intubation Type: IV induction Ventilation: Mask ventilation without difficulty Laryngoscope Size: Miller and 3 Grade View: Grade I Tube type: Oral Tube size: 7.5 mm Number of attempts: 1 Airway Equipment and Method: Stylet and Oral airway Placement Confirmation: ETT inserted through vocal cords under direct vision,  positive ETCO2 and breath sounds checked- equal and bilateral Secured at: 21 cm Tube secured with: Tape Dental Injury: Teeth and Oropharynx as per pre-operative assessment

## 2014-11-06 NOTE — Progress Notes (Signed)
Received report on patient. Currently in OR.

## 2014-11-06 NOTE — Progress Notes (Signed)
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 He has less jaundice after the ERCP/stent placement yesterday.  Objective: Vital signs in last 24 hours: Blood pressure 117/73, pulse 86, temperature 97.7 F (36.5 C), temperature source Oral, resp. rate 18, height 6\' 1"  (1.854 m), weight 152 lb 8.9 oz (69.2 kg), SpO2 100 %.  Intake/Output from previous day: 07/14 0701 - 07/15 0700 In: 3084 [P.O.:237; I.V.:1438.7; NG/GT:30; IV Piggyback:750; TPN:628.3] Out: 1200 [Urine:725; Emesis/NG output:475]  Physical Exam:  Not performed today.  Lab Results:  Recent Labs  11/04/14 0500 11/06/14 0135  WBC 6.2 5.2  HGB 8.8* 8.3*  HCT 27.1* 25.0*  PLT 453* 361    BMET  Recent Labs  11/05/14 0915 11/06/14 0135  NA 133* 129*  K 2.9* 3.5  CL 102 102  CO2 22 23  GLUCOSE 102* 130*  BUN 6 6  CREATININE 0.39* 0.48*  CALCIUM 7.8* 7.3*    Studies/Results: Dg Ercp  11/05/2014   CLINICAL DATA:  Biliary obstruction.  Biliary stent placement.  EXAM: ERCP  TECHNIQUE: Multiple spot images obtained with the fluoroscopic device and submitted for interpretation post-procedure.  COMPARISON:  Abdominal MRI - 11/01/2014 ; CT abdomen pelvis - 10/24/2014  FINDINGS: 8 spot intraoperative radiographic images of the right upper abdominal quadrant during ERCP and biliary stent placement are provided for review.  Initial image demonstrates an ERCP probe overlying the right upper abdominal quadrant. Subsequent images demonstrate selective cannulation and opacification of the common bile duct.  There is focal narrowing involving the mid aspect of the CBD as was demonstrated on preceding abdominal MRI. It is unclear whether there are ill-defined filling defects the more central aspect of the CBD versus artifact due to underdistention.  Subsequent images demonstrate placement of a metallic biliary stent within  the CBD with tip terminating within the duodenum.  There is minimal opacification of the left intrahepatic biliary tree which appears moderately dilated. There is no definitive opacification of the cystic or pancreatic ducts.  IMPRESSION: 1. ERCP with biliary stent placement as above. 2. Persistent focal narrowing involving the mid aspect of the CBD, similar to prior abdominal MRI. Correlation with the operative report is recommended. These images were submitted for radiologic interpretation only. Please see the procedural report for the amount of contrast and the fluoroscopy time utilized.   Electronically Signed   By: Sandi Mariscal M.D.   On: 11/05/2014 15:05    Medications: I have reviewed the patient's current medications.  Assessment/Plan:  1. 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  CT 06/30/2014 consistent with a partial small bowel obstruction and abdominal carcinomatosis  Omentum biopsy 07/01/2014 confirmed adenocarcinoma, nonspecific staining pattern  PET scan 07/07/2014 with multiple areas of hypermetabolic bowel wall thickening consistent with serosal implants from carcinomatosis, thickening and hypermetabolic activity at the terminal ileum felt to potentially represent a primary neoplasm, solitary right liver metastasis, hypermetabolic nodular lung lesions  Cycle 1 FOLFOX 07/15/2014  Cycle 2 FOLFOX 07/28/2014  Cycle 3 FOLFOX 08/11/2014  Cycle 4 FOLFOX 08/25/2014  2. Abdominal pain secondary to #1, improved 3. Iron deficiency, Hemoccult positive stool 4. Partial small bowel obstruction-recurrent bowel obstruction symptoms requiring hospital admission 08/18/2014 5. Bronchiectasis 6. 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 Nausea following cycle 1 FOLFOX, Emend and Aloxi added with cycle 2 8. Neutropenia secondary to chemotherapy-Neulasta will be added with cycle 4 FOLFOX 9. Admission 09/06/2014  with a small  bowel obstruction, status post resection of the terminal ileum/right colon and small bowel bypass 09/09/2014  Pathology confirmed diffuse involvement of the small bowel, large bowel, and appendix with adenocarcinoma-no primary tumor site found 10. Anemia secondary to chronic disease, chemotherapy, and recent surgery  2 units packed red blood cells 09/23/2014  11. Malnutrition 12. Admission 10/05/2014 with an abdominal abscess, status post percutaneous drainage 13. Frequent loose bowel movements following in the right colectomy and small bowel bypass, improved with Lomotil 14. Bilateral pleural effusions-likely secondary to anasarca 15. Malnutrition 16. Admission 10/24/2014 with a small bowel obstruction-resolved with bowel rest 17. Obstructive jaundice-rising alkaline phosphatase/bilirubin with CT 10/20/2014 confirming a dilated common bile duct with intrahepatic duct dilatation  MRCP 11/01/2014 reveals obstruction of the common bile duct  ERCP 11/05/2014 confirmed a mid common bile duct stricture, status post placement of a metal stent  18. Admission 11/02/2014 with a recurrent small bowel structure-status post placement of a palliative gastrostomy tube 11/06/2014.  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 He would like to proceed with home hospice care. I discussed the case with Dr. Hassell Done. We anticipate he will be stable for discharge to home with hospice on 11/07/2014.  I contacted the Hospice medical director to expedient the home Hospice referral. I added sublingual Roxanol to use as needed for pain.  Recommendations: 1. Discharge planning for home hospice care beginning 11/07/2014.    LOS: 5 days   Telma Pyeatt, Dominica Severin  11/06/2014, 3:41 PM

## 2014-11-06 NOTE — Progress Notes (Addendum)
[facility] NOTE  Pharmacy Consult for TPN Indication: Recurrent SBO  No Known Allergies  Patient Measurements: Height: 6' 1"  (185.4 cm) Weight: 152 lb 8.9 oz (69.2 kg) IBW/kg (Calculated) : 79.9 Usual Weight:   Vital Signs: Temp: 98.4 F (36.9 C) (07/15 0615) Temp Source: Oral (07/15 0615) BP: 111/67 mmHg (07/15 0615) Pulse Rate: 67 (07/15 0615) Intake/Output from previous day: 07/14 0701 - 07/15 0700 In: 3084 [P.O.:237; I.V.:1438.7; NG/GT:30; IV Piggyback:750; TPN:628.3] Out: 1200 [Urine:725; Emesis/NG output:475] Intake/Output from this shift:    Labs:  Recent Labs  11/04/14 0500 11/06/14 0135  WBC 6.2 5.2  HGB 8.8* 8.3*  HCT 27.1* 25.0*  PLT 453* 361     Recent Labs  11/03/14 1307 11/04/14 0500 11/05/14 0915 11/06/14 0135  NA  --  131* 133* 129*  K  --  3.4* 2.9* 3.5  CL  --  99* 102 102  CO2  --  21* 22 23  GLUCOSE  --  87 102* 130*  BUN  --  7 6 6   CREATININE  --  0.45* 0.39* 0.48*  CALCIUM  --  7.9* 7.8* 7.3*  MG  --   --  1.5* 1.7  PHOS  --   --  2.5 2.9  PROT 6.5 6.3* 6.8 5.9*  ALBUMIN 1.9* 1.9* 2.0* 1.8*  AST 77* 76* 87* 54*  ALT 32 29 33 23  ALKPHOS 1579* 1537* 1714* 1333*  BILITOT 6.4* 6.5* 7.8* 3.9*  BILIDIR 3.9*  --   --   --   IBILI 2.5*  --   --   --    Estimated Creatinine Clearance: 87.7 mL/min (by C-G formula based on Cr of 0.48).    Recent Labs  11/05/14 1658 11/05/14 2350 11/06/14 0620  GLUCAP 107* 125* 114*    Medical History: Past Medical History  Diagnosis Date  . Tremor     takes Primidone 250 once daily and inderall 160 daily for this-has had it since he was 21  . Pneumonia 06/2012  . Macular degeneration   . Benign essential tremor   . Coronary artery disease   . Anemia 06/30/2014  . Essential hypertension 08/18/2014  . 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 Ascending aortic aneurysm 08/2014  . Sepsis 09/2014  . GERD (gastroesophageal reflux disease)   . Postoperative  intra-abdominal abscess 09/2014  . SBO (small bowel obstruction)     recurrent  . Hyperlipidemia   . Hypoalbuminemia 09/2014  . Adenocarcinoma carcinomatosis 07/03/2014      Insulin Requirements in the past 24 hours: 1 unit  Current Nutrition: None  IVF: NS at 35 ml/hr  Central access: currently has implanted port, PICC placed 7/14 TPN start date: 7/14  ASSESSMENT                                                                                                          HPI: 52 yoM admitted with carcinomatosis with bowel and biliary obstruction.  Pt has h/o abdominal carcinomatosis with unknown primary (currently undergoing chemotherapy, cycle 5 of FOLFOX  postponed due to current admission).  Previous obstructions resolved with conservative treatment.  Not improving after 4 days of conservative management this time.  Surgery consulted and recommends surgery 7/15 and for TPN to be initiated.  Significant events:  7/14 ERCP/stent placement 7/15 Ex Lap/ Gastrostomy  Today, 11/06/2014:    Glucose - no history of DM, serum glucose increased on TPN but <150. Continue to check CBGs while on TPN.  Electrolytes - K 3.5, Mag 1.7, Phos 2.9, Na 129, CorrCa WNL  Renal - SCr low, CrCl~85 ml/min  LFTs - tbili and alk phos extremely elevated 2/2 biliary obstruction  TGs - pending  Prealbumin - pending  NUTRITIONAL GOALS                                                                                             RD recs:  Kcal: 1750-1950 Protein: 75-85 grams Fluid: 2.2 L/day Goal rate: Clinimix E 5/20 at 32m/hr  PLAN                                                                                                                         At 1800 today:  Change to Clinimix E 5/20 at 50 ml/hr.  20% fat emulsion at 10 ml/hr.  Plan to advance as tolerated to the goal rate.  TPN to contain standard multivitamins and 1/2 trace elements due to elevated TBili.  Will also            add folic  acid and thiamine given risk for refeeding.  Reduce IVF to 25 ml/hr.  KCl 10 mEq/100 mL IV x 4 runs.   KPhos 13ml IV x1.   Magnesium sulfate 2g IV x 1.  Add CBG checks and sensitive scale SSI q6h.   TPN lab panels on Mondays & Thursdays.  F/u daily labs.  LiBiagio Borg/15/2016,7:38 AM

## 2014-11-06 NOTE — Progress Notes (Signed)
   11/06/14 1300  Clinical Encounter Type  Visited With Family (wife Wesley Dillon)  Visit Type Follow-up;Spiritual support;Social support  Spiritual Encounters  Spiritual Needs Emotional (anticipatory grief)  Stress Factors  Patient Stress Factors Health changes;Loss of control  Family Stress Factors Loss of control;Major life changes   Familiar with Dr Viann Shove wife Wesley Dillon from a caregiving relationship we established years ago when her neighbor was dying at [facility].  Met Wesley Dillon again with her husband for chemo class shortly after his diagnosis.  Provided pastoral support to Wesley Dillon in lobby and to pt's door as she shared some of her sorrow and anticipatory grief as she processes her husband's decline.  Prayer is an important coping tool for her; another source of comfort and meaning-making is her understanding that her husband will be reunited with their deceased son someday.  Served as pastoral presence and witness to her stories, love, and pain.  She is aware of ongoing chaplain availability for support, but please also page as needs arise.  Thank you.  Chaplain Lorrin Jackson, [place] pager:  [phone] [facility] chaplain 24/7 pager :  [phone]

## 2014-11-06 NOTE — Anesthesia Postprocedure Evaluation (Signed)
  Anesthesia Post-op Note  Patient: Wesley Dillon  Procedure(s) Performed: Procedure(s): EXPLORATORY LAPAROTOMY (N/A) GASTROSTOMY (N/A)  Patient Location: PACU  Anesthesia Type:General  Level of Consciousness: awake, alert , oriented and patient cooperative  Airway and Oxygen Therapy: Patient Spontanous Breathing and Patient connected to nasal cannula oxygen  Post-op Pain: none  Post-op Assessment: Post-op Vital signs reviewed, Patient's Cardiovascular Status Stable, Respiratory Function Stable, Patent Airway, No signs of Nausea or vomiting and Pain level controlled              Post-op Vital Signs: Reviewed and stable  Last Vitals:  Filed Vitals:   11/06/14 1000  BP: 117/64  Pulse: 70  Temp: 36.4 C  Resp: 15    Complications: No apparent anesthesia complications

## 2014-11-06 NOTE — Interval H&P Note (Signed)
History and Physical Interval Note:  11/06/2014 7:42 AM  Wesley Dillon  has presented today for surgery, with the diagnosis of Small Bowel Obstruction  The various methods of treatment have been discussed with the patient and family. After consideration of risks, benefits and other options for treatment, the patient has consented to  Procedure(s): EXPLORATORY LAPAROTOMY (N/A) GASTROSTOMY (N/A) as a surgical intervention .  The patient's history has been reviewed, patient examined, no change in status, stable for surgery.  I have reviewed the patient's chart and labs.  Questions were answered to the patient's satisfaction.     [name]

## 2014-11-06 NOTE — Progress Notes (Signed)
Pt evaluated by Dr. Hassell Done and states pt may be able to go home tomorrow.  Foley was discontinued by Dr. Hassell Done. Pt able to take clear liquids.Marland Kitchen Pt MD left within few minutes, pt wife called out that there is leaking around gastrostomy tube. On exam, pt has pink color draining from around site. Pt denies pain at site. Dr. Hassell Done paged and was informed. Advise to change dressing as needed due to pt ascites.

## 2014-11-06 NOTE — Anesthesia Preprocedure Evaluation (Addendum)
Anesthesia Evaluation  Patient identified by MRN, date of birth, ID band Patient awake    Reviewed: Allergy & Precautions, NPO status , Patient's Chart, lab work & pertinent test results  History of Anesthesia Complications Negative for: history of anesthetic complications  Airway Mallampati: II  TM Distance: >3 FB Neck ROM: Full    Dental  (+) Caps, Dental Advisory Given   Pulmonary  Bronchiectasis Mycobacterium avium breath sounds clear to auscultation        Cardiovascular hypertension, Pt. on medications and Pt. on home beta blockers - angina+ Peripheral Vascular Disease (4.7cm asc aortic aneurysm) Rhythm:Regular Rate:Normal  '14 treadmill test: no ischemia   Neuro/Psych negative neurological ROS     GI/Hepatic GERD-  Medicated and Controlled,Malignant biliary obstruction: elevated LFTs Small bowel obstruction   Endo/Other  negative endocrine ROS  Renal/GU negative Renal ROS     Musculoskeletal   Abdominal   Peds  Hematology  (+) Blood dyscrasia (Hb 8.3), ,   Anesthesia Other Findings   Reproductive/Obstetrics                           Anesthesia Physical Anesthesia Plan  ASA: IV  Anesthesia Plan: General   Post-op Pain Management:    Induction: Intravenous, Rapid sequence and Cricoid pressure planned  Airway Management Planned: Oral ETT  Additional Equipment:   Intra-op Plan:   Post-operative Plan: Possible Post-op intubation/ventilation  Informed Consent: I have reviewed the patients History and Physical, chart, labs and discussed the procedure including the risks, benefits and alternatives for the proposed anesthesia with the patient or authorized representative who has indicated his/her understanding and acceptance.   Dental advisory given  Plan Discussed with: CRNA and Surgeon  Anesthesia Plan Comments: (Plan routine monitors, GETA with possible post op  ventilation)        Anesthesia Quick Evaluation

## 2014-11-07 LAB — COMPREHENSIVE METABOLIC PANEL
ALK PHOS: 917 U/L — AB (ref 38–126)
ALT: 19 U/L (ref 17–63)
ANION GAP: 5 (ref 5–15)
AST: 32 U/L (ref 15–41)
Albumin: 1.7 g/dL — ABNORMAL LOW (ref 3.5–5.0)
BILIRUBIN TOTAL: 2.6 mg/dL — AB (ref 0.3–1.2)
BUN: 6 mg/dL (ref 6–20)
CO2: 26 mmol/L (ref 22–32)
CREATININE: 0.4 mg/dL — AB (ref 0.61–1.24)
Calcium: 7.3 mg/dL — ABNORMAL LOW (ref 8.9–10.3)
Chloride: 100 mmol/L — ABNORMAL LOW (ref 101–111)
GFR calc Af Amer: >60 mL/min (ref >60)
GFR calc non Af Amer: >60 mL/min (ref >60)
GLUCOSE: 128 mg/dL — AB (ref 65–99)
POTASSIUM: 3.3 mmol/L — AB (ref 3.5–5.1)
Sodium: 131 mmol/L — ABNORMAL LOW (ref 135–145)
Total Protein: 5.7 g/dL — ABNORMAL LOW (ref 6.5–8.1)

## 2014-11-07 LAB — MAGNESIUM: Magnesium: 1.8 mg/dL (ref 1.7–2.4)

## 2014-11-07 LAB — CBC
HEMATOCRIT: 26.1 % — AB (ref 39.0–52.0)
Hemoglobin: 8.4 g/dL — ABNORMAL LOW (ref 13.0–17.0)
MCH: 29.1 pg (ref 26.0–34.0)
MCHC: 32.2 g/dL (ref 30.0–36.0)
MCV: 90.3 fL (ref 78.0–100.0)
Platelets: 316 10*3/uL (ref 150–400)
RBC: 2.89 MIL/uL — ABNORMAL LOW (ref 4.22–5.81)
RDW: 23.5 % — AB (ref 11.5–15.5)
WBC: 7.2 10*3/uL (ref 4.0–10.5)

## 2014-11-07 LAB — PHOSPHORUS: Phosphorus: 2.3 mg/dL — ABNORMAL LOW (ref 2.5–4.6)

## 2014-11-07 LAB — GLUCOSE, CAPILLARY: Glucose-Capillary: 127 mg/dL — ABNORMAL HIGH (ref 65–99)

## 2014-11-07 MED ORDER — FAT EMULSION 20 % IV EMUL
240.0000 mL | INTRAVENOUS | Status: DC
  Filled 2014-11-07: qty 250

## 2014-11-07 MED ORDER — MAGNESIUM SULFATE 2 GM/50ML IV SOLN
2.0000 g | Freq: Once | INTRAVENOUS | Status: DC
  Filled 2014-11-07: qty 50

## 2014-11-07 MED ORDER — POTASSIUM CHLORIDE 10 MEQ/50ML IV SOLN
10.0000 meq | INTRAVENOUS | Status: DC
  Filled 2014-11-07 (×5): qty 50

## 2014-11-07 MED ORDER — MORPHINE SULFATE (CONCENTRATE) 10 MG/0.5ML PO SOLN: 10.0000 mg | ORAL | Status: AC | PRN

## 2014-11-07 MED ORDER — POTASSIUM CHLORIDE ER 10 MEQ PO TBCR: 20.0000 meq | EXTENDED_RELEASE_TABLET | Freq: Every day | ORAL | Status: AC | PRN

## 2014-11-07 MED ORDER — POTASSIUM CHLORIDE 20 MEQ/15ML (10%) PO SOLN
40.0000 meq | Freq: Once | ORAL | Status: AC
  Administered 2014-11-07: 40 meq via ORAL
  Filled 2014-11-07: qty 30

## 2014-11-07 MED ORDER — ONDANSETRON 4 MG PO TBDP: 4.0000 mg | ORAL_TABLET | ORAL | Status: AC | PRN

## 2014-11-07 MED ORDER — PRIMIDONE 250 MG PO TABS: 250.0000 mg | ORAL_TABLET | Freq: Every day | ORAL | Status: AC

## 2014-11-07 MED ORDER — TRACE MINERALS CR-CU-MN-SE-ZN 10-1000-500-60 MCG/ML IV SOLN
INTRAVENOUS | Status: DC
  Filled 2014-11-07: qty 1200

## 2014-11-07 MED ORDER — FUROSEMIDE 20 MG PO TABS: 20.0000 mg | ORAL_TABLET | Freq: Every day | ORAL | Status: AC | PRN

## 2014-11-07 MED ORDER — HEPARIN SOD (PORK) LOCK FLUSH 100 UNIT/ML IV SOLN
500.0000 [IU] | INTRAVENOUS | Status: AC | PRN
  Administered 2014-11-07: 500 [IU]

## 2014-11-07 MED ORDER — POTASSIUM PHOSPHATES 15 MMOLE/5ML IV SOLN
20.0000 mmol | Freq: Once | INTRAVENOUS | Status: DC
  Filled 2014-11-07: qty 6.67

## 2014-11-07 NOTE — Progress Notes (Signed)
PARENTERAL NUTRITION CONSULT NOTE  Pharmacy Consult for TPN Indication: Recurrent SBO  No Known Allergies  Patient Measurements: Height: 6' 1"  (185.4 cm) Weight: 152 lb 12.5 oz (69.3 kg) IBW/kg (Calculated) : 79.9 Usual Weight:   Vital Signs: Temp: 98.3 F (36.8 C) (07/16 0410) Temp Source: Oral (07/16 0410) BP: 119/78 mmHg (07/16 0410) Pulse Rate: 98 (07/16 0410) Intake/Output from previous day: 07/15 0701 - 07/16 0700 In: 2953.8 [P.O.:120; I.V.:900; IV Piggyback:455; TPN:1478.8] Out: 2100 [Urine:945; Drains:1055; Blood:100] Intake/Output from this shift:    Labs:  Recent Labs  11/06/14 0135 11/07/14 0512  WBC 5.2 7.2  HGB 8.3* 8.4*  HCT 25.0* 26.1*  PLT 361 316     Recent Labs  11/05/14 0915 11/06/14 0135 11/07/14 0512  NA 133* 129* 131*  K 2.9* 3.5 3.3*  CL 102 102 100*  CO2 22 23 26   GLUCOSE 102* 130* 128*  BUN 6 6 6   CREATININE 0.39* 0.48* 0.40*  CALCIUM 7.8* 7.3* 7.3*  MG 1.5* 1.7 1.8  PHOS 2.5 2.9 2.3*  PROT 6.8 5.9* 5.7*  ALBUMIN 2.0* 1.8* 1.7*  AST 87* 54* 32  ALT 33 23 19  ALKPHOS 1714* 1333* 917*  BILITOT 7.8* 3.9* 2.6*  PREALBUMIN  --  6.3*  --   TRIG  --  136  --    Estimated Creatinine Clearance: 87.8 mL/min (by C-G formula based on Cr of 0.4).    Recent Labs  11/06/14 1741 11/06/14 2314 11/07/14 0622  GLUCAP 123* 137* 127*    Medical History: Past Medical History  Diagnosis Date  . Tremor     takes Primidone 250 once daily and inderall 160 daily for this-has had it since he was 21  . Pneumonia 06/2012  . Macular degeneration   . Benign essential tremor   . Coronary artery disease   . Anemia 06/30/2014  . Essential hypertension 08/18/2014  . 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 Ascending aortic aneurysm 08/2014  . Sepsis 09/2014  . GERD (gastroesophageal reflux disease)   . Postoperative intra-abdominal abscess 09/2014  . SBO (small bowel obstruction)     recurrent  . Hyperlipidemia   .  Hypoalbuminemia 09/2014  . Adenocarcinoma carcinomatosis 07/03/2014      Insulin Requirements in the past 24 hours: 3 unit  Current Nutrition: None  IVF: None  Central access: currently has implanted port, PICC placed 7/14 TPN start date: 7/14  ASSESSMENT                                                                                                          HPI: 89 yoM admitted with carcinomatosis with bowel and biliary obstruction.  Pt has h/o abdominal carcinomatosis with unknown primary (currently undergoing chemotherapy, cycle 5 of FOLFOX postponed due to current admission).  Previous obstructions resolved with conservative treatment.  Not improving after 4 days of conservative management this time.  Surgery consulted and recommends surgery 7/15 and for TPN to be initiated.  Significant events:  7/14 ERCP/stent placement 7/15 abd, exp and palliative gastrostomy tube placement  Today, 11/07/2014:    Glucose - no history of DM, CBG's <150. Continue to check CBGs while on TPN.  Electrolytes - K 3.3, Mag 1.8, Phos 2.3, Na 131, CorrCa WNL  Renal - SCr low, CrCl~85 ml/min  LFTs - tbili and alk phos elevated but improved 2/2 biliary obstruction  TGs - 136  Prealbumin - 6.3  NUTRITIONAL GOALS                                                                                             RD recs:  Kcal: 1750-1950 Protein: 75-85 grams Fluid: 2.2 L/day Goal rate: Clinimix E 5/20 at 10m/hr  PLAN                                                                                                                         At 1800 today:  continue Clinimix E 5/20 at 50 ml/hr.  20% fat emulsion at 10 ml/hr.  Plan to advance as tolerated to the goal rate.  TPN to contain standard multivitamins and will add back  full trace elements due to improvement in TBili.  Will also add folic acid and thiamine given risk for refeeding.  KCl 10 mEq/100 mL IV x 5 runs.   KPhos 265ml IV x1.    Magnesium sulfate 2g IV x 1.  continue CBG checks and sensitive scale SSI q6h.   BMet in am  TPN lab panels on Mondays & Thursdays.  F/u daily labs.  ElDolly RiasPh 11/07/2014, 8:03 AM Pager [phone]

## 2014-11-07 NOTE — Care Management Note (Signed)
Case Management Note  Patient Details  Name: Wesley Dillon MRN: [id number] Date of Birth: February 07, 1946  Subjective/Objective:     cancer               Action/Plan: Home Hospice  Expected Discharge Date:  11/07/2014               Expected Discharge Plan:  Home w Hospice Care  In-House Referral:     Discharge planning Services  CM Consult      HH Arranged:  RN [facility] Agency:  Hospice and [place]  Status of Service:  Completed, signed off  Medicare Important Message Given:  Yes-second notification given Date Medicare IM Given:    Medicare IM give by:    Date Additional Medicare IM Given:    Additional Medicare Important Message give by:     If discussed at [place] of Stay Meetings, dates discussed:    Additional Comments: NCM contacted [facility] and spoke to [facility] RN, Sharyn Lull, they are scheduled to see pt on 11/08/2014 for admission. Faxed progress note, order, and H&P to [place], [phone]. Erenest Rasher, RN 11/07/2014, 11:59 AM

## 2014-11-07 NOTE — Discharge Summary (Signed)
Physician Discharge Summary  Wesley Dillon NWG:[id number] DOB: 01-15-1946 DOA: 11/01/2014  PCP: Wesley Heck, MD  Admit date: 11/01/2014 Discharge date: 11/07/2014  Time spent: 50 minutes  Recommendations for Outpatient Follow-up:  1. 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 His medical history summary is as follows: has had PET scan 07/07/2014 with with multiple areas of hypermetabolic bowel wall thickening consistent with serosal implants from carcinomatosis, thickening and hypermetabolic activity at the terminal ileum felt to potentially represent a primary neoplasm, solitary right liver metastasis, hypermetabolic nodular lung lesions. He was started on chemotherapy under Dr. Gearldine Dillon care (FOLFOX given 07/15/14, 07/28/14, 08/11/14 and 08/25/14). He has had few admissions for small bowel obstruction (his first bowel obstruction was at the time of the diagnosis 06/30/2014). He was hospitalized in 07/2014 and then again in 08/2014 when he underwent resection of the terminal ileum/right colon and small bowel bypass 09/09/2014. He was hospitalized in 09/2014 for abdominal abscess and underwent percutaneous drainage at that time. His most recent admission was 10/24/2014 with SBO which resolved without surgical intervention and he was discharged home 10/26/2014. He was doing relatively ok but then developed nausea, vomiting, abdominal distention for last 2 days prior to this admission. Is also found at this  admission to have hyper-bilirubinemia.  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 However this time the obstruction did not resolve. The decision was made by surgery for the patient to undergo venting g tube for palliative purposes. This was done yesterday exploratory lap. There was extensive answer throughout his abdomen and no focal obstruction was found. -He has been advised by surgery to advance to full liquid diet and not advance any further.  Elevated alk phos and elevated bilirubin:  MRI showed intra and extra hepatobiliary ductal dilatation.  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 - palliative stent on 7/14   SEVERE Protein calorie malnutrition -Nutrition consult was requested  Hyponatremia - have been given IV NS in the hospital stay   Hypokalemia/ hypomagnesemia  - replaced  Consultations:  Oncology  Surgery  GI  Discharge Exam: Filed Weights   11/05/14 0428 11/06/14 0615 11/07/14 0410  Weight: 67.3 kg (148 lb 5.9 oz) 69.2 kg (152 lb 8.9 oz) 69.3 kg (152 lb 12.5 oz)   Filed Vitals:   11/07/14 0410  BP: 119/78  Pulse: 98  Temp: 98.3 F (36.8 C)  Resp: 18    General: AAO x 3, no distress Cardiovascular: RRR, no murmurs  Respiratory: clear to auscultation bilaterally GI: soft, non-tender, non-distended, bowel sound positive  Discharge Instructions You were cared for by a hospitalist during your hospital stay. If you have any questions  about your discharge medications or the care you received while  you were in the hospital after you are discharged, you can call the unit and asked to speak with the hospitalist on call if the hospitalist that took care of you is not available. Once you are discharged, your primary care physician will handle any further medical issues. Please note that NO REFILLS for any discharge medications will be authorized once you are discharged, as it is imperative that you return to your primary care physician (or establish a relationship with a primary care physician if you do not have one) for your aftercare needs so that they can reassess your need for medications and monitor your lab values.      Discharge Instructions    Discharge instructions    Complete by:  As directed   Advance diet slowly to full liquids     Increase activity slowly    Complete by:  As directed             Medication List    STOP taking these medications        ciprofloxacin 500 MG tablet  Commonly known as:  CIPRO     diphenoxylate-atropine 2.5-0.025 MG per tablet  Commonly known as:  LOMOTIL     lidocaine 5 %  Commonly known as:  LIDODERM     lidocaine-prilocaine cream  Commonly known as:  EMLA     metroNIDAZOLE 500 MG tablet  Commonly known as:  FLAGYL     oxyCODONE 5 MG immediate release tablet  Commonly known as:  Oxy IR/ROXICODONE     saccharomyces boulardii 250 MG capsule  Commonly known as:  FLORASTOR      TAKE these medications        acetaminophen 500 MG tablet  Commonly known as:  TYLENOL  Take 1,000 mg by mouth every 6 (six) hours as needed for moderate pain or headache.     furosemide 20 MG tablet  Commonly known as:  LASIX  Take 1 tablet (20 mg total) by mouth daily as needed for fluid or edema.     megestrol 40 MG/ML suspension  Commonly known as:  MEGACE  Take 10 mLs (400 mg total) by mouth daily.     morphine CONCENTRATE 10 MG/0.5ML Soln concentrated solution  Place 0.5 mLs (10 mg total) under the tongue every 2 (two) hours as needed for  moderate pain or shortness of breath.     ondansetron 4 MG disintegrating tablet  Commonly known as:  ZOFRAN ODT  Take 1 tablet (4 mg total) by mouth every 4 (four) hours as needed for nausea or vomiting.     pantoprazole 40 MG tablet  Commonly known as:  PROTONIX  Take 1 tablet (40 mg total) by mouth 2 (two) times daily.     potassium chloride 10 MEQ tablet  Commonly known as:  K-DUR  Take 2 tablets (20 mEq total) by mouth daily as needed (when you take Lasix).     PRESERVISION AREDS PO  Take 2 capsules by mouth every morning.     primidone 250 MG tablet  Commonly known as:  MYSOLINE  Take 1 tablet (250 mg total) by mouth at bedtime.     prochlorperazine 10 MG tablet  Commonly known as:  COMPAZINE  Take 1 tablet (10 mg total) by mouth every 6 (six) hours as needed for nausea.     propranolol 10 MG tablet  Commonly known as:  INDERAL  TAKE 1 TABLET  BY MOUTH 2 TIMES DAILY.     sucralfate 1 G tablet  Commonly known as:  CARAFATE  Take 1 tablet (1 g total) by mouth 4 (four) times daily -  with meals and at bedtime. Chew prior to swallowing       No Known Allergies Follow-up Information    Follow up with Johnathan Hausen B, MD. Schedule an appointment as soon as possible for a visit in 2 weeks.   Specialty:  General Surgery   Why:  For suture removal   Contact information:   [address] [place] [address] [phone]        The results of significant diagnostics from this hospitalization (including imaging, microbiology, ancillary and laboratory) are listed below for reference.    Significant Diagnostic Studies: Dg Chest 2 View  10/11/2014   CLINICAL DATA:  Short of breath and weakness  EXAM: CHEST  2 VIEW  COMPARISON:  10/05/2014  FINDINGS: Right jugular Port-A-Cath is stable. Bilateral pleural effusions have increased. No pneumothorax. Cardiomegaly.  IMPRESSION: Increasing bilateral pleural effusions.   Electronically Signed   By: Marybelle Killings M.D.    On: 10/11/2014 08:22   Dg Abd 1 View  11/04/2014   CLINICAL DATA:  Follow-up of small bowel obstruction, clinically improved but persistent abdominal distention  EXAM: ABDOMEN - 1 VIEW  COMPARISON:  Abdominal film of November 02, 2014  FINDINGS: The pigtail catheter on the right has been removed. There remain loops of mildly distended gas-filled small bowel in the mid abdomen. There is gas in the pelvis likely in the rectum. The esophagogastric tube's proximal port lies at or just below the GE junction. The tip lies in the gastric cardia. The bony structures are unremarkable. There are no abnormal soft tissue calcifications.  IMPRESSION: 1. Persistent distention of small-bowel loops in the mid abdomen consistent with a mid to distal small bowel obstruction. 2. Advancement of the nasogastric tube by 10 cm is recommended to assure that the proximal port lies below the GE junction.   Electronically Signed   By: Prentiss  Martinique M.D.   On: 11/04/2014 09:12   Dg Abd 1 View  10/26/2014   CLINICAL DATA:  Small bowel obstruction.  EXAM: ABDOMEN - 1 VIEW  COMPARISON:  10/25/2014  FINDINGS: Stable drainage catheter in the right abdomen. Stable surgical changes in the right abdomen. Scattered air, contrast in stool in the colon. No findings for small bowel obstruction or free air.  IMPRESSION: Stable abdominal radiograph. No findings for small bowel obstruction or free air.   Electronically Signed   By: Marijo Sanes M.D.   On: 10/26/2014 09:11   Ct Angio Chest Pe W/cm &/or Wo Cm  10/11/2014   CLINICAL DATA:  Left-sided chest pain and shortness of breath. Reported history of carcinomatosis.  EXAM: CT ANGIOGRAPHY CHEST WITH CONTRAST  TECHNIQUE: Multidetector CT imaging of the chest was performed using the standard protocol during bolus administration of intravenous contrast. Multiplanar CT image reconstructions and MIPs were obtained to evaluate the vascular anatomy.  CONTRAST:  117m OMNIPAQUE IOHEXOL 350 MG/ML SOLN   COMPARISON:  10/11/2014 chest radiograph, chest CT 10/05/2014  FINDINGS: Mediastinum/Nodes: Exam quality is suboptimal for evaluation for pulmonary embolism due to bolus timing and inhomogeneity of the contrast bolus. Allowing for this, no large central filling defect is identified to suggest pulmonary arterial embolism.  Ascending aortic aneurysm measuring 4.8 cm image 58 is stable. Moderate atheromatous coronary arterial and aortic calcification. Small pericardial effusion is  stable. No new lymphadenopathy.  Lungs/Pleura: Areas of subpleural patchy airspace opacity are noted, increased since previously in the left upper lobe anteriorly, for example image 55, as well as associated compressive atelectasis. Central airways are patent. No measurable nodule or mass.  Upper abdomen: Ascites is visualized.  Musculoskeletal: No acute osseous abnormality or lytic or sclerotic osseous lesion.  Review of the MIP images confirms the above findings.  IMPRESSION: Suboptimal exam quality for evaluation for pulmonary embolism without evidence for central large pulmonary arterial filling defect.  No significant change in 4.8 cm ascending aortic aneurysm.  Increased, now large, bilateral pleural effusions with associated compressive atelectasis.  Increased subpleural reticular airspace patchy opacities particularly in the left upper lobe anteriorly. These are nonspecific and could represent very early pneumonia but are amenable to followup at future presumed restaging studies.   Electronically Signed   By: Conchita Paris M.D.   On: 10/11/2014 10:27   Ct Abdomen Pelvis W Contrast  10/24/2014   CLINICAL DATA:  Abdominal pain. Recent percutaneous drainage of a right abdominal abscess. History of peritoneal carcinomatosis with prior right hemicolectomy.  EXAM: CT ABDOMEN AND PELVIS WITH CONTRAST  TECHNIQUE: Multidetector CT imaging of the abdomen and pelvis was performed using the standard protocol following bolus administration of  intravenous contrast.  CONTRAST:  31m OMNIPAQUE IOHEXOL 300 MG/ML SOLN, 1080mOMNIPAQUE IOHEXOL 300 MG/ML SOLN  COMPARISON:  10/20/2014, 10/05/2014, 09/08/2014  FINDINGS: There is marked dilatation of proximal and mid small bowel with abrupt transition to decompressed distal small bowel. The transition point is in the right lower quadrant. This has the appearance of a moderately high-grade small bowel obstruction. The colon is decompressed.  There is a moderate volume peritoneal ascites, increased. The right pigtail catheter abscess drain is unchanged in position. There is a minute peripherally enhancing collection around the catheter, measuring only 9 mm in depth by about 5 cm in longest dimension. This is unchanged or slightly smaller than on 10/20/2014. There is no change in the moderate bile duct dilatation. No focal liver lesions are evident. There is no change in the unremarkable appearances of the spleen, pancreas, adrenals and kidneys with the exception of a 2 cm cyst at the posterior aspect of the left kidney. There is no free intraperitoneal air. There is a moderately large left effusion and a small right effusion. There is slight atelectatic lung base opacity adjacent to the pleural fluid collections. This is unchanged.  IMPRESSION: 1. New moderately high-grade small bowel obstruction with transition point in the right lower quadrant. 2. Increased volume of peritoneal ascites 3. Unchanged position of the pigtail catheter with a tiny residual collection around the catheter measuring about 0.9 x 5 cm 4. Moderately large left pleural effusion and small right pleural effusion.   Electronically Signed   By: DaAndreas Newport.D.   On: 10/24/2014 06:35   Ct Abdomen Pelvis W Contrast  10/20/2014   CLINICAL DATA:  History of adenocarcinoma with peritoneal carcinomatosis, post exploratory laparotomy, resection of terminal ileum and right colon and small bowel bypass performed on 09/09/2014).  CTs of the  abdomen pelvis performed 10/05/2014 demonstrated a large (approximately 30.1) cm peripherally enhancing fluid collection, for which the patient underwent a post CT-guided percutaneous drainage catheter placement later that same day.  Patient presents to the Interventional Radiology drain Clinic for repeat CT of the abdomen pelvis and drain evaluation.  EXAM: CT ABDOMEN AND PELVIS WITH CONTRAST  TECHNIQUE: Multidetector CT imaging of the abdomen and pelvis was  performed using the standard protocol following bolus administration of intravenous contrast.  CONTRAST:  88m OMNIPAQUE IOHEXOL 300 MG/ML  SOLN  COMPARISON:  CT abdomen pelvis - 10/05/2014; 08/29/2014  FINDINGS: Unchanged positioning of right-sided percutaneous drainage catheter placement with tiny (approximately 5.3 x 1.5 x 3.9 cm) residual peripherally enhancing air and fluid collecting located within the right mid hemi abdomen adjacent to the enteric anastomotic suture line.  Re- demonstrated small to moderate amount of intra-abdominal ascites. No definitive new defined intra-abdominal fluid collections.  Post right hemicolectomy without evidence of enteric obstruction. Moderate colonic stool burden. There is unchanged minimal amount of patulous distension about the small bowel enteric anastomotic site, again without evidence of enteric obstruction (representative image 75, series 2, coronal image 43, series 5). No pneumoperitoneum, pneumatosis or portal venous gas.  No bulky retroperitoneal, mesenteric, pelvic or inguinal lymphadenopathy.  Normal hepatic contour. Common bile duct remains enlarged measuring 1.2 cm in greatest oblique coronal diameter (image 76, series 5) with associated mild to moderate centralized intrahepatic biliary duct dilatation. 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 No discrete hepatic lesions.  There is symmetric enhancement and excretion of the bilateral kidneys. Unchanged approximately 1.5 cm partially  exophytic hypo attenuating cyst arising from the posterior superior aspect of the left kidney (15, series 7). No discrete right-sided renal lesions. No definite renal stones this postcontrast examination. No urinary obstruction or perinephric stranding.  Normal appearance of the bilateral adrenal glands, pancreas and spleen.  Moderate amount of calcified atherosclerotic plaque within a normal caliber abdominal aorta. The major branch vessels of the abdominal aorta appear patent on this non CTA examination.  Normal appearance of the pelvic organs. Normal appearance of the urinary bladder given degree distention.  Limited visualization the lower thorax demonstrates minimal decrease in small residual right-sided pleural effusion. Unchanged small to moderate size left-sided effusion. Grossly unchanged subsegmental atelectasis within in the right lower lobe and caudal segment of the lingula.  Normal heart size. Coronary artery calcifications. Calcifications within the aortic valve leaflets. There is suspected mild fusiform aneurysmal dilatation of the imaged proximal ascending thoracic aorta measuring approximately 4.7 cm in maximal oblique axial diameter (image 1, series 2).  No acute or aggressive osseous abnormalities. Mild-to-moderate multilevel lumbar spine DDD, worse at L2-L3 and L5-S1 with disc space height loss, endplate irregularity and sclerosis.  Mild diffuse body wall anasarca. Small bilateral mesenteric fat containing inguinal hernias.  IMPRESSION: 1. Interval near complete resolution of large right-sided abdominal abscess post percutaneous drainage catheter placement. The patient underwent a subsequent fluoroscopic guided drainage catheter injection. 2. Residual moderate volume intra-abdominal ascites without new definable / drainable intra-abdominal fluid collection. 3. Stable sequela of right hemicolectomy and small bowel enteric anastomosis without evidence of enteric obstruction. 4. 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 5. Unchanged suspected mild aneurysmal dilatation of the ascending thoracic aorta measuring approximately 4.7 cm in maximal diameter, incompletely evaluated.   Electronically Signed   By: JSandi MariscalM.D.   On: 10/20/2014 09:40   Ir Sinus/fist Tube Chk-non Gi  11/04/2014   CLINICAL DATA:  Follow-up abdominal abscess drain. No significant drainage from the catheter and no obvious collection on recent CT imaging. 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  EXAM: DRAIN INJECTION WITH FLUOROSCOPY  Physician: AStephan Minister HAnselm Pancoast MD  FLUOROSCOPY TIME:  24 seconds, 6.3  mGy  MEDICATIONS AND MEDICAL HISTORY: None  ANESTHESIA/SEDATION: Moderate sedation time: None  CONTRAST:  10 mL Omnipaque-300  PROCEDURE: Patient was placed supine on the interventional table. Contrast was injected through the right abdominal drain. The catheter suture was removed. The catheter was cut and completely removed. Bandage placed at the old drain site.  FINDINGS: Injection of contrast demonstrates reflux around the catheter and drainage onto the skin. There is no evidence for an abscess collection. No evidence for a fistula connection to bowel.  COMPLICATIONS: None  IMPRESSION: Negative for a residual abscess cavity and negative for a fistula. As a result, the drain was completely removed.   Electronically Signed   By: Markus Daft M.D.   On: 11/04/2014 08:40   Ir Sinus/fist Tube Chk-non Gi  10/20/2014   CLINICAL DATA:  History of adenocarcinoma with peritoneal carcinomatosis, post exploratory laparotomy, resection of terminal ileum and right colon and small bowel bypass performed on 09/09/2014).  CT of the abdomen pelvis performed 10/05/2014 demonstrated a large (approximately 30.1) cm peripherally enhancing fluid collection,  for which the patient underwent a post CT-guided percutaneous drainage catheter placement later that same day.  Patient presents to the Interventional Radiology drain Clinic for repeat CT of the abdomen pelvis and drain evaluation.  Patient reports minimal (approximately 5-10 cc) of output from the percutaneous drainage catheter. The patient continues to flush the percutaneous drainage catheter.  CT the abdomen pelvis performed earlier same day demonstrates near complete resolution of the large right-sided intra-abdominal abscess post percutaneous drainage catheter placement.  CCS Attending:  H[place]worth  EXAM: SINUS TRACT INJECTION/FISTULOGRAM  COMPARISON:  CT abdomen pelvis - earlier same day ; 10/05/2014; CT the chest, abdomen pelvis - 09/08/2014; CT-guided percutaneous drainage catheter placement -10/05/2014  CONTRAST:  12m OMNIPAQUE IOHEXOL 300 MG/ML  SOLN  FLUOROSCOPY TIME:  54 seconds (44 mGy)  TECHNIQUE: The patient was positioned supine on the fluoroscopy table.  A spot fluoroscopic image was obtained of the right mid hemi abdomen Ing and the existing percutaneous drainage catheter. Multiple spot fluoroscopic and radiographic images were obtained following the injection of a small amount of contrast via the existing percutaneous drainage catheter. Images were reviewed in the catheter was flushed with a small amount of saline and reconnected to a gravity bag. The patient tolerated the procedure well without immediate postprocedural complication.  FINDINGS: Preprocedural spot fluoroscopic image demonstrates grossly unchanged positioning of previously placed percutaneous drainage catheter with tip coiled and locked over the right mid hemi abdomen.  Contrast injection and demonstrates opacification of the residual decompressed abscess cavity. There is a extremely tiny residual fistulous connection to the adjacent colonic enteric anastomosis site within the adjacent right mid hemi abdomen.  There is a minimal  amount of reflux along the catheter tract to the entrance site at the skin surface.  IMPRESSION: 1. Appropriately positioned and functioning percutaneous drainage catheter with near complete resolution/decompression of previously noted large right intra-abdominal abscess post percutaneous drainage catheter placement. 2. Tiny residual fistulous connection to the adjacent enteric anastomosis.  PLAN: The patient was instructed to no longer flush the percutaneous drainage catheter. He was instructed to continue to monitor the output from the percutaneous drain.  [facility][phone] during the week of 7/11 for drainage catheter evaluation and repeat fluoroscopic guided percutaneous drainage catheter injection.  CCS Attending:  H[place]worth   Electronically Signed   By: JSandi MariscalM.D.   On: 10/20/2014 10:21  Mr 3d Recon At Scanner  11/02/2014   CLINICAL DATA:  Abdominal pain and distension. History of recent small bowel obstruction and abscess drainage. Peritoneal carcinomatosis with right hemicolectomy.  EXAM: MRI ABDOMEN WITHOUT AND WITH CONTRAST (INCLUDING MRCP)  TECHNIQUE: Multiplanar multisequence MR imaging of the abdomen was performed both before and after the administration of intravenous contrast. Heavily T2-weighted images of the biliary and pancreatic ducts were obtained, and three-dimensional MRCP images were rendered by post processing.  CONTRAST:  78m MULTIHANCE GADOBENATE DIMEGLUMINE 529 MG/ML IV SOLN  COMPARISON:  CT, most recent 10/24/2014.  FINDINGS: Portions of exam are mildly motion degraded.  Lower chest: Small left pleural effusion. Trace right pleural fluid. Heart size upper normal.  Hepatobiliary: Foci of hepatic hyper enhancement within the dome are nonspecific. Example image 17 of series 11001. There are foci of T2 hyperintensity and hypo enhancement along the hepatic capsule, suspicious for  carcinomatosis/peritoneal metastasis.  Gallbladder distension with multiple gallstones. Moderate intrahepatic ductal dilatation, including left hepatic duct at 8 mm on image 25 of series 3. Moderate common duct dilatation at 1.3 cm on image 87 of series 4. The common duct undergoes an abrupt transition in the porta hepatis. An approximately 2 cm span of narrowed to obliterated duct is identified. The distal most 2.5 cm of duct is normal in caliber at 6 mm on image 114 of series 400. On post-contrast imaging, there is a amorphous soft tissue and vague hyper enhancement along the tract of the narrowed -obliterated duct. Example images 62-65 of series 11002. No well-defined mass. Is felt to be just to the right of the pancreatic head.  Pancreas: Otherwise within normal limits. No pancreatic duct dilatation or acute pancreatitis.  Spleen: Normal  Adrenals/Urinary Tract: Normal adrenal glands. Normal right kidney. 1.4 cm interpolar left renal cyst. No hydronephrosis.  Stomach/Bowel: Gastric distension. Diffuse fluid-filled small bowel loops.  Vascular/Lymphatic: Normal caliber of the aorta and branch vessels. 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 Example image 58 of series 11002. No well-defined retroperitoneal adenopathy.  Other: Ascites, moderate in volume. Areas of loculated ascites, including adjacent the caudate lobe of the liver.  Musculoskeletal: Probable hemangioma within the lower thoracic spine, eccentric right.  IMPRESSION: 1. Moderate intra and extrahepatic duct dilatation to the level of an abrupt cut off in the porta hepatis. Vague hyper enhancement along an approximately 2 cm area of common duct obliteration. If the patient has a known primary malignancy, this is suspicious for either ductal or periductal metastasis. Alternatively, given the clinical history of unknown primary, this could represent a primary extrahepatic cholangiocarcinoma. ERCP and/or  percutaneous cholangiogram should be considered. These results will be called to the ordering clinician or representative by the Radiologist Assistant, and communication documented in the PACS or zVision Dashboard. 2. 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 3. Cholelithiasis with gallbladder distention. 4. Mildly motion degraded exam. 5. Gastric and bowel distention, likely representing persistent bowel obstruction. The patient may benefit from nasogastric tube placement. 6. Loculated ascites and peritoneal carcinomatosis, as before. 7. Left larger than right pleural effusions.   Electronically Signed   By: KAbigail MiyamotoM.D.   On: 11/02/2014 08:52   Dg Ercp  11/05/2014   CLINICAL DATA:  Biliary obstruction.  Biliary stent placement.  EXAM: ERCP  TECHNIQUE: Multiple spot images obtained with the fluoroscopic device and submitted for interpretation post-procedure.  COMPARISON:  Abdominal MRI - 11/01/2014 ;  CT abdomen pelvis - 10/24/2014  FINDINGS: 8 spot intraoperative radiographic images of the right upper abdominal quadrant during ERCP and biliary stent placement are provided for review.  Initial image demonstrates an ERCP probe overlying the right upper abdominal quadrant. Subsequent images demonstrate selective cannulation and opacification of the common bile duct.  There is focal narrowing involving the mid aspect of the CBD as was demonstrated on preceding abdominal MRI. It is unclear whether there are ill-defined filling defects the more central aspect of the CBD versus artifact due to underdistention.  Subsequent images demonstrate placement of a metallic biliary stent within the CBD with tip terminating within the duodenum.  There is minimal opacification of the left intrahepatic biliary tree which appears moderately dilated. There is no definitive opacification of the cystic or pancreatic ducts.  IMPRESSION: 1. ERCP with  biliary stent placement as above. 2. Persistent focal narrowing involving the mid aspect of the CBD, similar to prior abdominal MRI. Correlation with the operative report is recommended. These images were submitted for radiologic interpretation only. Please see the procedural report for the amount of contrast and the fluoroscopy time utilized.   Electronically Signed   By: Sandi Mariscal M.D.   On: 11/05/2014 15:05   Dg Abd 2 Views  11/02/2014   CLINICAL DATA:  Abdominal distention with bowel obstruction  EXAM: ABDOMEN - 2 VIEW  COMPARISON:  November 01, 2014  FINDINGS: Supine and upright images obtained. Drain remains in the lateral right abdomen with postoperative change in the right abdomen. There is small bowel dilatation with multiple air-fluid levels in a pattern concerning for obstruction. No free air. There is vascular calcification the pelvis.  IMPRESSION: The bowel gas pattern is consistent with a degree of obstruction. No free air. Drain remains in the lateral right abdomen. Overall appearance is similar compared to 1 day prior.   Electronically Signed   By: Lowella Grip III M.D.   On: 11/02/2014 08:44   Dg Abd 2 Views  10/08/2014   CLINICAL DATA:  Recent small bowel obstruction, known abdominal carcinomatosis  EXAM: ABDOMEN - 2 VIEW  COMPARISON:  10/05/2014  FINDINGS: Scattered large and small bowel gas is noted. A drainage catheter is noted in the right mid abdomen. Bibasilar atelectatic changes with associated effusions are noted. No definitive free air is seen. No true obstructive changes are noted.  IMPRESSION: Bibasilar atelectasis and associated effusions similar to that seen on recent CT examination.  Right mid abdominal drainage catheter in satisfactory position.  No true obstructive changes are noted at this time.   Electronically Signed   By: Inez Catalina M.D.   On: 10/08/2014 15:34   Dg Abd Acute W/chest  11/01/2014   CLINICAL DATA:  Abdominal pain and distention today. History of  recent bowel obstruction an abscess drainage.  EXAM: DG ABDOMEN ACUTE W/ 1V CHEST  COMPARISON:  10/26/2014 radiograph and CT scan 10/24/2014  FINDINGS: The upright chest x-ray demonstrates a stable are poor. There are small bilateral pleural effusions and overlying atelectasis but no infiltrates or edema. The heart is normal in size. The mediastinal and hilar contours are stable.  Two views of the abdomen demonstrate a stable drainage catheter in the region of the right pericolic gutter. Dilated small bowel loops with air-fluid levels suggesting recurrent small bowel obstruction. Persistent ascites.  IMPRESSION: 1. Small bilateral pleural effusions and bibasilar atelectasis. 2. Suspect recurrent small bowel obstruction and ascites.   Electronically Signed   By: Ricky Stabs.D.  On: 11/01/2014 10:45   Dg Abd Portable 1v  10/25/2014   CLINICAL DATA:  Follow-up small bowel obstruction  EXAM: PORTABLE ABDOMEN - 1 VIEW  COMPARISON:  10/08/2014  FINDINGS: Nonobstructive bowel gas pattern. Drainage catheter in the right abdomen, stable. No supine evidence of free air. No organomegaly or suspicious calcification. No acute bony abnormality.  IMPRESSION: Nonobstructive bowel gas pattern.   Electronically Signed   By: Rolm Baptise M.D.   On: 10/25/2014 07:51   Mr Abd W/wo Cm/mrcp  11/02/2014   CLINICAL DATA:  Abdominal pain and distension. History of recent small bowel obstruction and abscess drainage. Peritoneal carcinomatosis with right hemicolectomy.  EXAM: MRI ABDOMEN WITHOUT AND WITH CONTRAST (INCLUDING MRCP)  TECHNIQUE: Multiplanar multisequence MR imaging of the abdomen was performed both before and after the administration of intravenous contrast. Heavily T2-weighted images of the biliary and pancreatic ducts were obtained, and three-dimensional MRCP images were rendered by post processing.  CONTRAST:  11m MULTIHANCE GADOBENATE DIMEGLUMINE 529 MG/ML IV SOLN  COMPARISON:  CT, most recent 10/24/2014.  FINDINGS:  Portions of exam are mildly motion degraded.  Lower chest: Small left pleural effusion. Trace right pleural fluid. Heart size upper normal.  Hepatobiliary: Foci of hepatic hyper enhancement within the dome are nonspecific. Example image 17 of series 11001. There are foci of T2 hyperintensity and hypo enhancement along the hepatic capsule, suspicious for carcinomatosis/peritoneal metastasis.  Gallbladder distension with multiple gallstones. Moderate intrahepatic ductal dilatation, including left hepatic duct at 8 mm on image 25 of series 3. Moderate common duct dilatation at 1.3 cm on image 87 of series 4. The common duct undergoes an abrupt transition in the porta hepatis. An approximately 2 cm span of narrowed to obliterated duct is identified. The distal most 2.5 cm of duct is normal in caliber at 6 mm on image 114 of series 400. On post-contrast imaging, there is a amorphous soft tissue and vague hyper enhancement along the tract of the narrowed -obliterated duct. Example images 62-65 of series 11002. No well-defined mass. Is felt to be just to the right of the pancreatic head.  Pancreas: Otherwise within normal limits. No pancreatic duct dilatation or acute pancreatitis.  Spleen: Normal  Adrenals/Urinary Tract: Normal adrenal glands. Normal right kidney. 1.4 cm interpolar left renal cyst. No hydronephrosis.  Stomach/Bowel: Gastric distension. Diffuse fluid-filled small bowel loops.  Vascular/Lymphatic: Normal caliber of the aorta and branch vessels. 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 Example image 58 of series 11002. No well-defined retroperitoneal adenopathy.  Other: Ascites, moderate in volume. Areas of loculated ascites, including adjacent the caudate lobe of the liver.  Musculoskeletal: Probable hemangioma within the lower thoracic spine, eccentric right.  IMPRESSION: 1. Moderate intra and extrahepatic duct dilatation to the level of an abrupt cut off in the  porta hepatis. Vague hyper enhancement along an approximately 2 cm area of common duct obliteration. If the patient has a known primary malignancy, this is suspicious for either ductal or periductal metastasis. Alternatively, given the clinical history of unknown primary, this could represent a primary extrahepatic cholangiocarcinoma. ERCP and/or percutaneous cholangiogram should be considered. These results will be called to the ordering clinician or representative by the Radiologist Assistant, and communication documented in the PACS or zVision Dashboard. 2. 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 3. Cholelithiasis with gallbladder distention. 4. Mildly motion degraded exam. 5. Gastric and bowel distention, likely representing persistent bowel  obstruction. The patient may benefit from nasogastric tube placement. 6. Loculated ascites and peritoneal carcinomatosis, as before. 7. Left larger than right pleural effusions.   Electronically Signed   By: Abigail Miyamoto M.D.   On: 11/02/2014 08:52    Microbiology: No results found for this or any previous visit (from the past 240 hour(s)).   Labs: Basic Metabolic Panel:  Recent Labs Lab 11/01/14 1715 11/02/14 0345 11/04/14 0500 11/05/14 0915 11/06/14 0135 11/07/14 0512  NA 130* 128* 131* 133* 129* 131*  K 3.7 3.5 3.4* 2.9* 3.5 3.3*  CL 97* 96* 99* 102 102 100*  CO2 24 23 21* _0 GLUCOSE 100* 99 87 102* 130* 128*  BUN _1 CREATININE 0.59* 0.53* 0.45* 0.39* 0.48* 0.40*  CALCIUM 8.3* 8.1* 7.9* 7.8* 7.3* 7.3*  MG 1.4*  --   --  1.5* 1.7 1.8  PHOS 3.5  --   --  2.5 2.9 2.3*   Liver Function Tests:  Recent Labs Lab 11/03/14 1307 11/04/14 0500 11/05/14 0915 11/06/14 0135 11/07/14 0512  AST 77* 76* 87* 54* 32  ALT 32 29 33 23 19  ALKPHOS 1579* 1537* 1714* 1333* 917*  BILITOT 6.4* 6.5* 7.8* 3.9* 2.6*  PROT 6.5 6.3* 6.8 5.9* 5.7*  ALBUMIN 1.9*  1.9* 2.0* 1.8* 1.7*   No results for input(s): LIPASE, AMYLASE in the last 168 hours. No results for input(s): AMMONIA in the last 168 hours. CBC:  Recent Labs Lab 11/01/14 0847 11/01/14 1715 11/02/14 0345 11/04/14 0500 11/06/14 0135 11/07/14 0512  WBC 8.2 8.2 6.9 6.2 5.2 7.2  NEUTROABS 5.8 5.4  --   --  3.3  --   HGB 9.8* 9.5* 8.7* 8.8* 8.3* 8.4*  HCT 30.4* 29.3* 27.3* 27.1* 25.0* 26.1*  MCV 88.6 89.1 90.4 89.1 89.3 90.3  PLT 565* 521* 502* 453* 361 316   Cardiac Enzymes: No results for input(s): CKTOTAL, CKMB, CKMBINDEX, TROPONINI in the last 168 hours. BNP: BNP (last 3 results)  Recent Labs  10/06/14 0545 10/11/14 0802  BNP 574.2* 223.3*    ProBNP (last 3 results) No results for input(s): PROBNP in the last 8760 hours.  CBG:  Recent Labs Lab 11/06/14 0620 11/06/14 1152 11/06/14 1741 11/06/14 2314 11/07/14 0622  GLUCAP 114* 107* 123* 137* 127*       SignedDebbe Odea, MD [facility] 11/07/2014, 11:55 AM

## 2014-11-07 NOTE — Progress Notes (Signed)
Patient ID: Wesley Dillon, male   DOB: 19-Mar-1946, 69 y.o.   MRN: [id number] 1 Day Post-Op  Subjective: Pretty comfortable this AM.  Anticipating going home with hospice care later today  Objective: Vital signs in last 24 hours: Temp:  [97.5 F (36.4 C)-98.8 F (37.1 C)] 98.3 F (36.8 C) (07/16 0410) Pulse Rate:  [62-98] 98 (07/16 0410) Resp:  [15-23] 18 (07/16 0410) BP: (108-126)/(60-78) 119/78 mmHg (07/16 0410) SpO2:  [97 %-100 %] 98 % (07/16 0410) Weight:  [69.3 kg (152 lb 12.5 oz)] 69.3 kg (152 lb 12.5 oz) (07/16 0410) Last BM Date: 11/03/14  Intake/Output from previous day: 07/15 0701 - 07/16 0700 In: 2953.8 [P.O.:120; I.V.:900; IV Piggyback:455; TPN:1478.8] Out: 2100 [Urine:945; Drains:1055; Blood:100] Intake/Output this shift:    General appearance: alert, cooperative and no distress GI: normal findings: soft, non-tender and distended Incision/Wound: Clean and dry  Lab Results:   Recent Labs  11/06/14 0135 11/07/14 0512  WBC 5.2 7.2  HGB 8.3* 8.4*  HCT 25.0* 26.1*  PLT 361 316   BMET  Recent Labs  11/06/14 0135 11/07/14 0512  NA 129* 131*  K 3.5 3.3*  CL 102 100*  CO2 23 26  GLUCOSE 130* 128*  BUN 6 6  CREATININE 0.48* 0.40*  CALCIUM 7.3* 7.3*     Studies/Results: Dg Ercp  11/05/2014   CLINICAL DATA:  Biliary obstruction.  Biliary stent placement.  EXAM: ERCP  TECHNIQUE: Multiple spot images obtained with the fluoroscopic device and submitted for interpretation post-procedure.  COMPARISON:  Abdominal MRI - 11/01/2014 ; CT abdomen pelvis - 10/24/2014  FINDINGS: 8 spot intraoperative radiographic images of the right upper abdominal quadrant during ERCP and biliary stent placement are provided for review.  Initial image demonstrates an ERCP probe overlying the right upper abdominal quadrant. Subsequent images demonstrate selective cannulation and opacification of the common bile duct.  There is focal narrowing involving the mid aspect of the CBD as  was demonstrated on preceding abdominal MRI. It is unclear whether there are ill-defined filling defects the more central aspect of the CBD versus artifact due to underdistention.  Subsequent images demonstrate placement of a metallic biliary stent within the CBD with tip terminating within the duodenum.  There is minimal opacification of the left intrahepatic biliary tree which appears moderately dilated. There is no definitive opacification of the cystic or pancreatic ducts.  IMPRESSION: 1. ERCP with biliary stent placement as above. 2. Persistent focal narrowing involving the mid aspect of the CBD, similar to prior abdominal MRI. Correlation with the operative report is recommended. These images were submitted for radiologic interpretation only. Please see the procedural report for the amount of contrast and the fluoroscopy time utilized.   Electronically Signed   By: Sandi Mariscal M.D.   On: 11/05/2014 15:05    Anti-infectives: Anti-infectives    Start     Dose/Rate Route Frequency Ordered Stop   11/06/14 0600  ceFAZolin (ANCEF) IVPB 2 g/50 mL premix    Comments:  Pharmacy may adjust dosing strength, interval, or rate of medication as needed for optimal therapy for the patient  Send with patient on call to the OR.  Anesthesia to complete antibiotic administration <64min prior to incision per [facility].   2 g 100 mL/hr over 30 Minutes Intravenous On call to O.R. 11/05/14 1246 11/06/14 0802   11/06/14 0400  ciprofloxacin (CIPRO) IVPB 400 mg  Status:  Discontinued     400 mg 200 mL/hr over 60 Minutes Intravenous Every 12  hours 11/05/14 1307 11/05/14 1455   11/05/14 1400  ciprofloxacin (CIPRO) IVPB 400 mg     400 mg 200 mL/hr over 60 Minutes Intravenous  Once 11/05/14 1306 11/05/14 1720   11/01/14 1800  ciprofloxacin (CIPRO) IVPB 400 mg  Status:  Discontinued     400 mg 200 mL/hr over 60 Minutes Intravenous Every 12 hours 11/01/14 1707 11/04/14 0805   11/01/14 1800  metroNIDAZOLE (FLAGYL) IVPB  500 mg  Status:  Discontinued     500 mg 100 mL/hr over 60 Minutes Intravenous Every 8 hours 11/01/14 1707 11/04/14 0805      Assessment/Plan: s/p Procedure(s): EXPLORATORY LAPAROTOMY GASTROSTOMY Doing OK post op.  Plan for discharge with hospice care today Follow up office two weeks for staple removal   LOS: 6 days    Trameka Dorough T 11/07/2014

## 2014-11-09 ENCOUNTER — Other Ambulatory Visit: Payer: Self-pay | Admitting: *Deleted

## 2014-11-09 ENCOUNTER — Telehealth: Payer: Self-pay

## 2014-11-09 ENCOUNTER — Encounter (HOSPITAL_COMMUNITY): Payer: Self-pay | Admitting: Surgery

## 2014-11-09 NOTE — Telephone Encounter (Signed)
[facility] called. Pt is admitted to hospice. He did have a fall yesterday, no injuries. This is an informational call.

## 2014-11-10 ENCOUNTER — Ambulatory Visit: Payer: 59 | Admitting: Neurology

## 2014-11-10 ENCOUNTER — Telehealth: Payer: Self-pay | Admitting: Oncology

## 2014-11-10 NOTE — Telephone Encounter (Signed)
Pt called to schedule a hospital f/u, pt confirmed labs/flush/ov..... KJ

## 2014-11-12 ENCOUNTER — Telehealth: Payer: Self-pay | Admitting: *Deleted

## 2014-11-12 DIAGNOSIS — C801 Malignant (primary) neoplasm, unspecified: Secondary | ICD-10-CM

## 2014-11-12 MED ORDER — SENNOSIDES-DOCUSATE SODIUM 8.6-50 MG PO TABS: 1.0000 | ORAL_TABLET | Freq: Two times a day (BID) | ORAL | Status: AC

## 2014-11-12 MED ORDER — MORPHINE SULFATE ER 15 MG PO TBCR: 15.0000 mg | EXTENDED_RELEASE_TABLET | Freq: Two times a day (BID) | ORAL | Status: AC

## 2014-11-12 NOTE — Telephone Encounter (Signed)
VM message received @ 9:22 am from pt's hopsice nurse with an update on his condition.  He has been experiencing increased pain the last 2 nights and has been using increased amount of liquid morphine. He is now agreeable to a long acting morphine and  Has been started on MS contin 15 mg every 12 hours and will still have liquid morphine for breakthrough pain.  He has also been started on Senna S for potential constipation  issues with opioid use.

## 2014-11-16 ENCOUNTER — Other Ambulatory Visit: Payer: Self-pay | Admitting: *Deleted

## 2014-11-16 ENCOUNTER — Telehealth: Payer: Self-pay | Admitting: *Deleted

## 2014-11-16 ENCOUNTER — Other Ambulatory Visit: Payer: Self-pay | Admitting: Oncology

## 2014-11-16 ENCOUNTER — Ambulatory Visit (HOSPITAL_BASED_OUTPATIENT_CLINIC_OR_DEPARTMENT_OTHER): Admitting: Oncology

## 2014-11-16 ENCOUNTER — Ambulatory Visit (HOSPITAL_COMMUNITY)
Admission: RE | Admit: 2014-11-16 | Discharge: 2014-11-16 | Disposition: A | Discharge status: Home / Self Care | Source: Ambulatory Visit | Attending: Oncology | Admitting: Oncology

## 2014-11-16 VITALS — BP 90/57 | HR 72 | Temp 97.9°F | Resp 18 | Ht 73.0 in | Wt 155.6 lb

## 2014-11-16 DIAGNOSIS — C786 Secondary malignant neoplasm of retroperitoneum and peritoneum: Secondary | ICD-10-CM | POA: Diagnosis not present

## 2014-11-16 DIAGNOSIS — C801 Malignant (primary) neoplasm, unspecified: Secondary | ICD-10-CM | POA: Diagnosis not present

## 2014-11-16 DIAGNOSIS — E860 Dehydration: Secondary | ICD-10-CM

## 2014-11-16 DIAGNOSIS — R140 Abdominal distension (gaseous): Secondary | ICD-10-CM | POA: Diagnosis not present

## 2014-11-16 MED ORDER — SODIUM CHLORIDE 0.45 % IV SOLN: INTRAVENOUS | Status: DC

## 2014-11-16 MED ORDER — SODIUM CHLORIDE 0.9 % IV SOLN
1000.0000 mL | Freq: Once | INTRAVENOUS | Status: AC
  Administered 2014-11-16: 1000 mL via INTRAVENOUS

## 2014-11-16 NOTE — Progress Notes (Signed)
Patient ID: Wesley Dillon, male   DOB: 26-Aug-1945, 69 y.o.   MRN: [id number] Patient presented to ultrasound department today for therapeutic paracentesis. On limited ultrasound of abdomen in all 4 quadrants there is only trace ascites present. Paracentesis was not performed at this time. Patient and Dr. [name] were notified.

## 2014-11-16 NOTE — Progress Notes (Signed)
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 He was taken to surgery and confirmed to have carcinomatosis. A palliative gastrostomy tube was placed. He reports everything he drinks comes out the gastrostomy tube. The gastrostomy tube was not draining last week, but now drains to straight drain. Despite the gastrostomy tube he continues to have a very distended abdomen. No bowel movement. Pain is relieved with Roxanol.  He presented to the office today secondary to the abdominal distention and pain.  Objective:  Vital signs in last 24 hours:  Blood pressure 77/51, pulse 84, temperature 97.9 F (36.6 C), temperature source Oral, resp. rate 19, height 6\' 1"  (1.854 m), weight 155 lb 9.6 oz (70.58 kg), SpO2 90 %.    HEENT: The mouth is dry Resp: Distant breath sounds, no respiratory distress Cardio: Regular rate and rhythm GI: Markedly distended, left upper quadrant gastrostomy tube site without evidence of infection. Gastric contents in the TUBE Vascular: No leg edema  Skin: Diminished skin turgor   Portacath/PICC-without erythema  Lab Results:  Lab Results  Component Value Date   WBC 7.2 11/07/2014   HGB 8.4* 11/07/2014   HCT 26.1* 11/07/2014   MCV 90.3 11/07/2014   PLT 316 11/07/2014   NEUTROABS 3.3 11/06/2014    Lab Results  Component Value Date   NA 131* 11/07/2014    Lab Results  Component Value Date   CEA 3.0 07/01/2014    Imaging:  No results found.  Medications: I have reviewed the patient's current medications.  Assessment/Plan: 1. 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  CT 06/30/2014 consistent with a partial small bowel obstruction and abdominal carcinomatosis  Omentum biopsy 07/01/2014 confirmed adenocarcinoma, nonspecific staining  pattern  PET scan 07/07/2014 with multiple areas of hypermetabolic bowel wall thickening consistent with serosal implants from carcinomatosis, thickening and hypermetabolic activity at the terminal ileum felt to potentially represent a primary neoplasm, solitary right liver metastasis, hypermetabolic nodular lung lesions  Cycle 1 FOLFOX 07/15/2014  Cycle 2 FOLFOX 07/28/2014  Cycle 3 FOLFOX 08/11/2014  Cycle 4 FOLFOX 08/25/2014  2. Abdominal pain secondary to #1, improved 3. Iron deficiency, Hemoccult positive stool 4. Partial small bowel obstruction-recurrent bowel obstruction symptoms requiring hospital admission 08/18/2014 5. Bronchiectasis 6. 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 Nausea following cycle 1 FOLFOX, Emend and Aloxi added with cycle 2 8. Neutropenia secondary to chemotherapy-Neulasta will be added with cycle 4 FOLFOX 9. Admission 09/06/2014 with a small bowel obstruction, status post resection of the terminal ileum/right colon and small bowel bypass 09/09/2014  Pathology confirmed diffuse involvement of the small bowel, large bowel, and appendix with adenocarcinoma-no primary tumor site found 10. Anemia secondary to chronic disease, chemotherapy, and recent surgery  2 units packed red blood cells 09/23/2014  11. Malnutrition 12. Admission 10/05/2014 with an abdominal abscess, status post percutaneous drainage 13. Frequent loose bowel movements following in the right colectomy and small bowel bypass, improved with Lomotil 14. Bilateral pleural effusions-likely secondary to anasarca 15. Malnutrition 16. Admission 10/24/2014 with a small bowel obstruction-resolved with bowel rest 17. Obstructive jaundice-rising alkaline phosphatase/bilirubin with CT 10/20/2014 confirming a dilated common bile duct with intrahepatic duct dilatation  MRCP 11/01/2014 reveals obstruction of the common bile duct  ERCP 11/05/2014 confirmed a mid common bile duct  stricture, status post placement of a metal stent  18. 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 He underwent placement of a palliative gastrostomy tube 11/06/2014 secondary to bowel obstruction from carcinomatosis. He is enrolled in the [facility] program.  [name] appears dehydrated. He requested IV fluids today. He received 1 L of saline at the Cancer center.  He was referred to interventional radiology and an ultrasound revealed no significant ascites. The abdominal distention is likely related to tumor and gas.  I contacted a hospice medical director and he will arrange for suction to the gastrostomy tube in an attempt to relieve the abdominal distention. He will also arrange for home IV fluids.  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 He is a candidate for [employer]. The hospice team will discuss [place] with [name] and his wife.    Betsy Coder, MD  11/16/2014  10:38 AM

## 2014-11-16 NOTE — Telephone Encounter (Signed)
Wesley Dillon called reporting patient has called a lot over the weekend c/o "abdominal distension and discomfort.  The G-tube has had to be manipulated or it is not draining.  Does Dr. Benay Dillon want Hospice to use Symptom Management order or doers he want to treat this?  It's clogged, has hypoactive bowel sounds.  Has not had a BM in nearly a week.  Does he want to try reglan or?"   Dr. Benay Dillon notified.  Further questions for Wesley Dillon for nausea or pain is primary and are they able to flush the drain.  Wesley Dillon reports the pain is well controlled at this time but the drain is flushed with manipulation and the main complaint is abdominal distension.  Verbal order received and read back from Dr. Benay Dillon for patient to come in at 10:00 am if he feels strong enough.  Wesley Dillon will notify [name]

## 2014-11-16 NOTE — Telephone Encounter (Signed)
Tried to call patient's mobile and home number.  No answer.  Patient is probably en route.  P.O.F. Generated.

## 2014-11-16 NOTE — Telephone Encounter (Signed)
Tried to call patient due to Dr. [name] needing to know if patient coming in today.  [id number] Triage voicemail retrieved at [place] from [place].  "We're in the car, on our way to [facility] to see Dr. [name] at 10:00."  Dr. [name] notified and will work him in.

## 2014-11-17 ENCOUNTER — Other Ambulatory Visit: Payer: PPO

## 2014-11-17 ENCOUNTER — Ambulatory Visit: Payer: PPO | Admitting: Oncology

## 2014-11-20 ENCOUNTER — Telehealth: Payer: Self-pay

## 2014-11-20 DIAGNOSIS — C801 Malignant (primary) neoplasm, unspecified: Secondary | ICD-10-CM

## 2014-11-20 MED ORDER — PANTOPRAZOLE SODIUM 40 MG PO TBEC: 40.0000 mg | DELAYED_RELEASE_TABLET | Freq: Two times a day (BID) | ORAL | Status: AC

## 2014-11-20 NOTE — Telephone Encounter (Signed)
Returning wife's call asking for refill of aciphex. Pt was given this by Dr Sheran Fava. It is not on his current medication list. He was taking 20 mg BID and it was making him more comfortable. [facility] No aciphex was noted on profile. Per protocol will refill the protonix. [name] is in agreement with this.

## 2014-11-23 ENCOUNTER — Telehealth: Payer: Self-pay

## 2014-11-23 NOTE — Telephone Encounter (Signed)
Lexa stated that Dr. Viann Shove breakthrough pain medication was changed over the weekend from Roxanol to Dilaudid.  [name] stated that he is more comfortable with the dilaudid along with the Ms Contin. Ativan was ordered prn. A hospital bed is in the home now and Mrs. Gongaware wants to keep her husband home at this time verses [employer].

## 2014-11-26 ENCOUNTER — Telehealth: Payer: Self-pay | Admitting: *Deleted

## 2014-11-26 NOTE — Telephone Encounter (Signed)
Hospice RN, Lexa called to see if she can remove patients staples. States wound is closed and well-healed. Pt is requesting that they be removed.

## 2014-11-26 NOTE — Telephone Encounter (Signed)
Called Lexa, Hospice RN with an order to remove staples, per Dr. [name]. She states she will remove staples at tomorrow's visit.

## 2014-11-30 ENCOUNTER — Telehealth: Payer: Self-pay | Admitting: *Deleted

## 2014-11-30 NOTE — Telephone Encounter (Signed)
Lexa RN reports Dr. 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  He is unresponsive.  Symptoms are adequately being managed.  He may live another day or two at most.  Lexa can be reached at [phone] if any questions.

## 2014-12-01 ENCOUNTER — Telehealth: Payer: Self-pay | Admitting: *Deleted

## 2014-12-01 ENCOUNTER — Telehealth: Payer: Self-pay | Admitting: Oncology

## 2014-12-24 NOTE — Telephone Encounter (Signed)
Kathy: [facility] called to inform MD of patient death 15-Dec-2014 at [phone]. Patient wife would not like any calls at this moment. Message sent to MD Sherrill/RN Amy.

## 2014-12-24 NOTE — Telephone Encounter (Signed)
Error in charted

## 2014-12-24 NOTE — Telephone Encounter (Signed)
Received death certificate 12/14/2014 °

## 2014-12-24 DEATH — deceased

## 2015-08-09 IMAGING — CT CT ABD-PELV W/ CM
2 of 5 series · 15 of 46 positions shown, 17 images · IV contrast (omnipaque)
Comparison: 10/20/2014, 10/05/2014, 09/08/2014

CLINICAL DATA: Abdominal pain. Recent percutaneous drainage of a
right abdominal abscess. History of peritoneal carcinomatosis with
prior right hemicolectomy.

EXAM:
CT ABDOMEN AND PELVIS WITH CONTRAST
TECHNIQUE: Multidetector CT imaging of the abdomen and pelvis was performed
using the standard protocol following bolus administration of
intravenous contrast.
CONTRAST:  50mL OMNIPAQUE IOHEXOL 300 MG/ML SOLN, 100mL OMNIPAQUE
IOHEXOL 300 MG/ML SOLN

[Series 2: abd/pel with · axial · 0.74mm/px · z∈[-527,-52]mm · 12 of 107 slices shown, 14 images]
[im 6/107  soft-tissue]
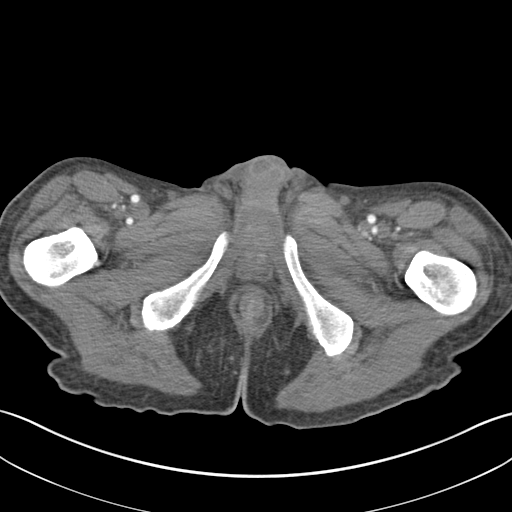
[im 6/107  bone]
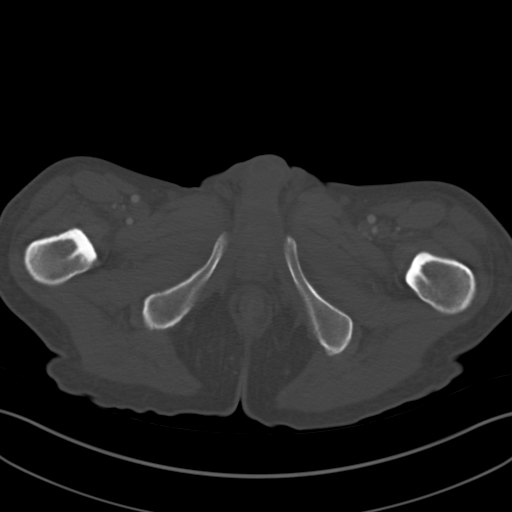
[im 17/107  soft-tissue]
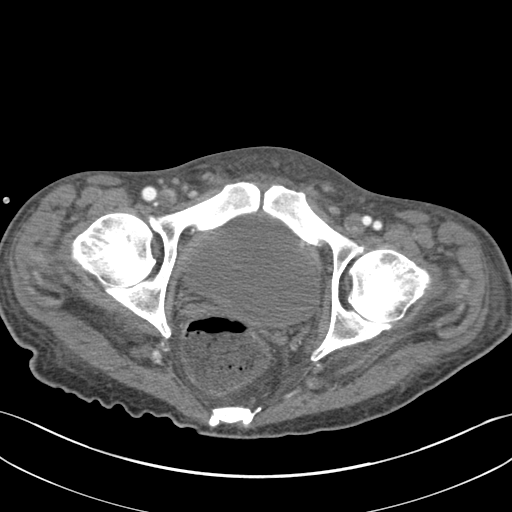
[im 23/107  soft-tissue]
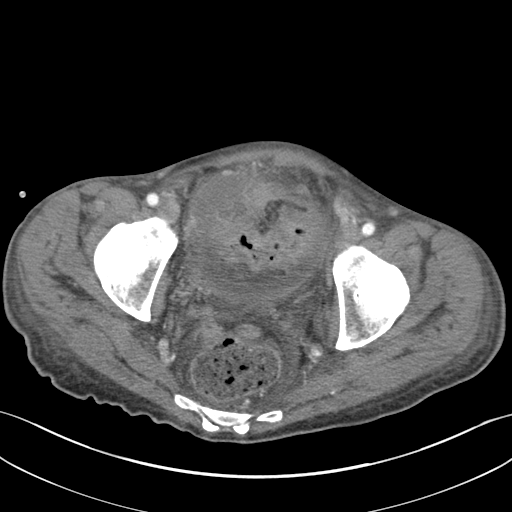
[im 34/107  soft-tissue]
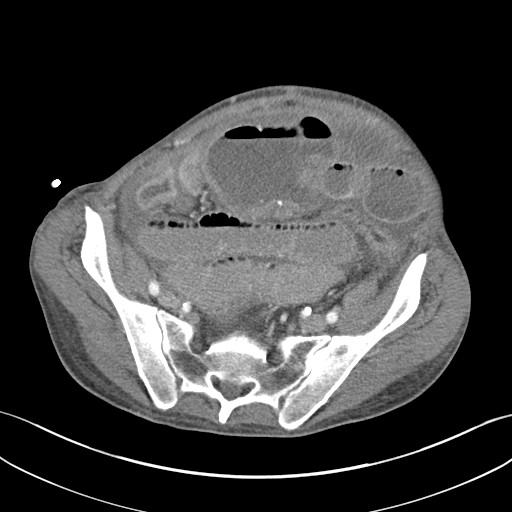
[im 40/107  soft-tissue]
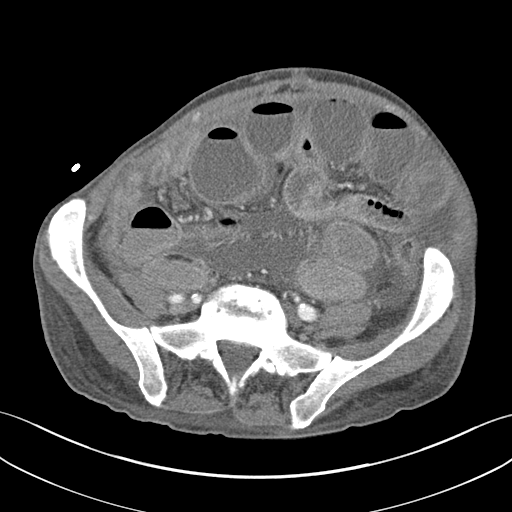
[im 51/107  soft-tissue]
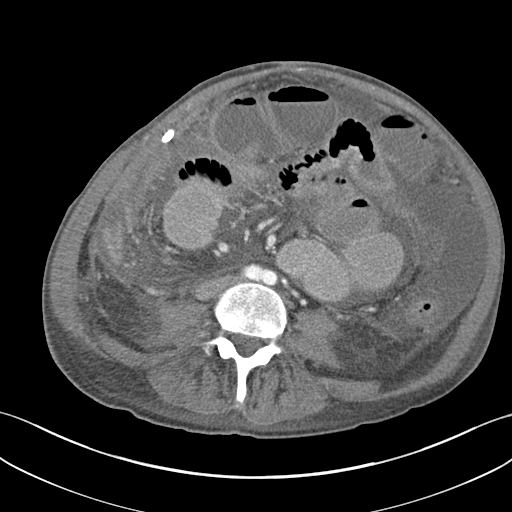
[im 56/107  soft-tissue]
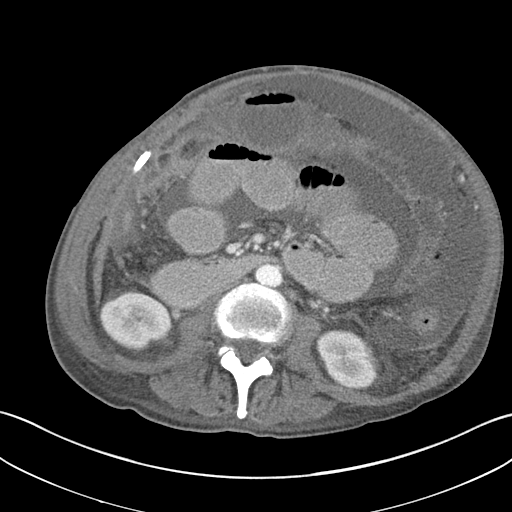
[im 67/107  soft-tissue]
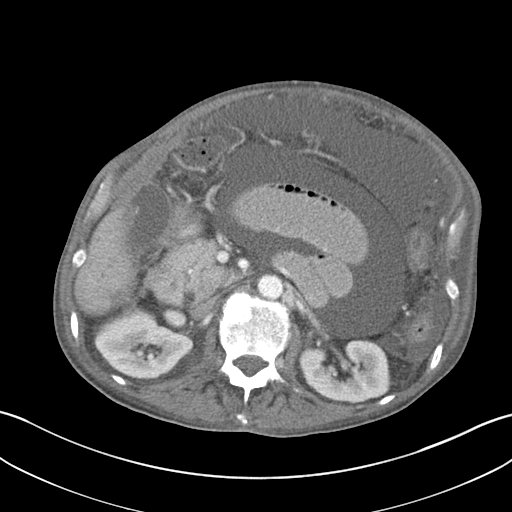
[im 73/107  soft-tissue]
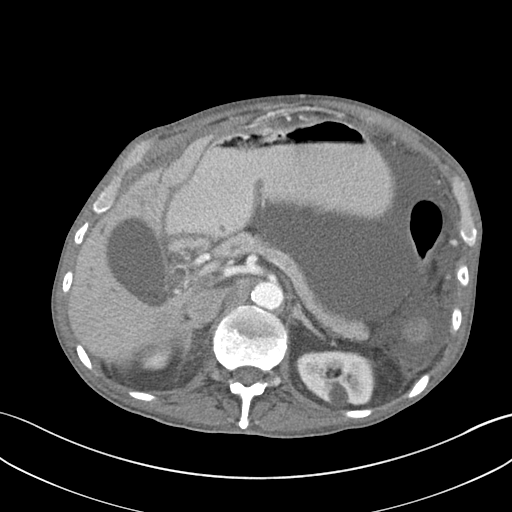
[im 73/107  bone]
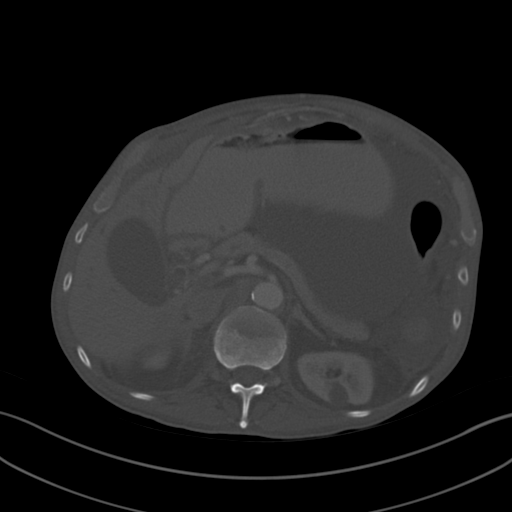
[im 84/107  soft-tissue]
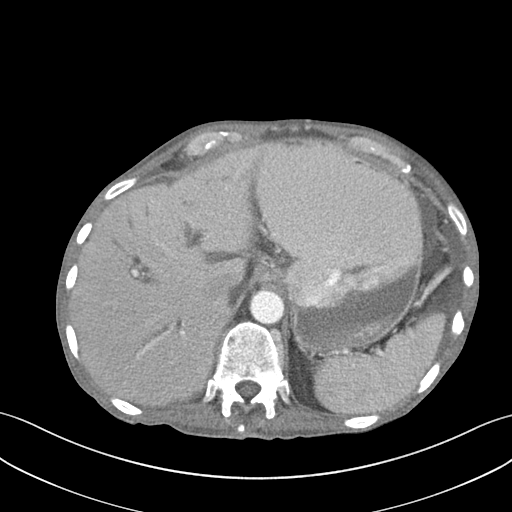
[im 90/107  soft-tissue]
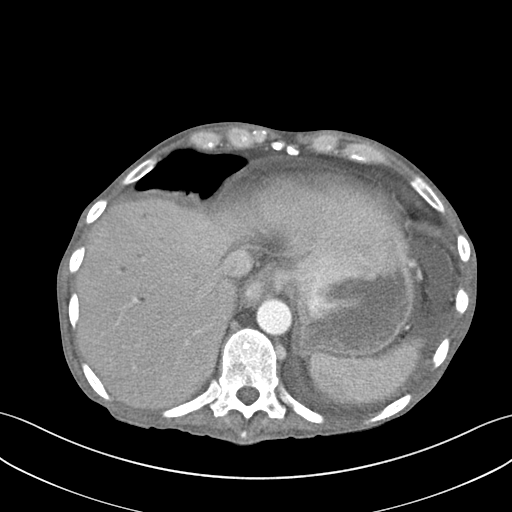
[im 101/107  soft-tissue]
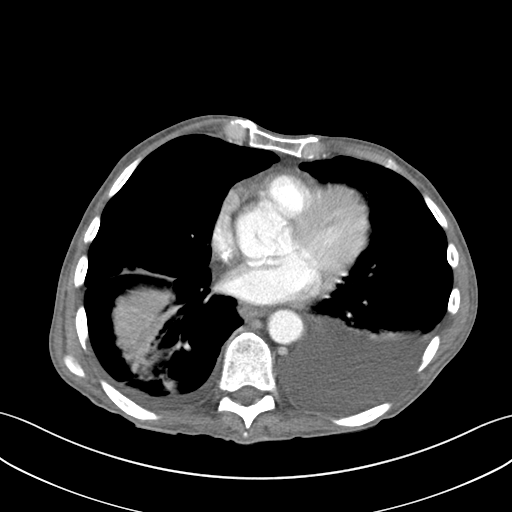

[Series 5: coronal a/|p · coronal · 0.75mm/px · 3 of 94 slices shown]
[im 32/94  soft-tissue]
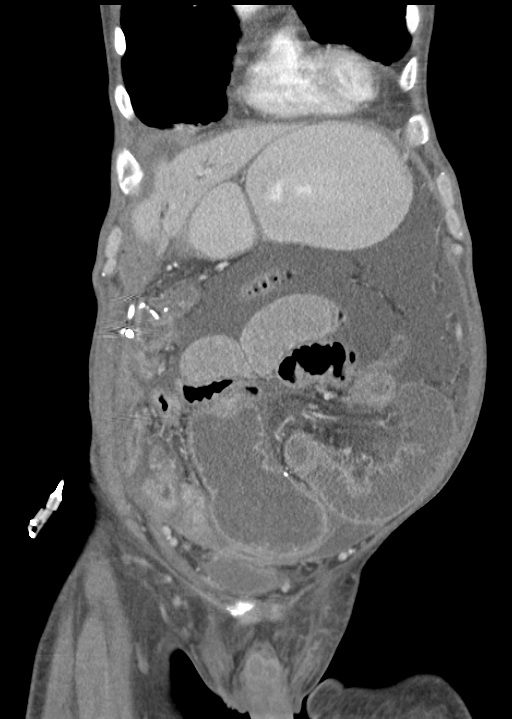
[im 42/94  soft-tissue]
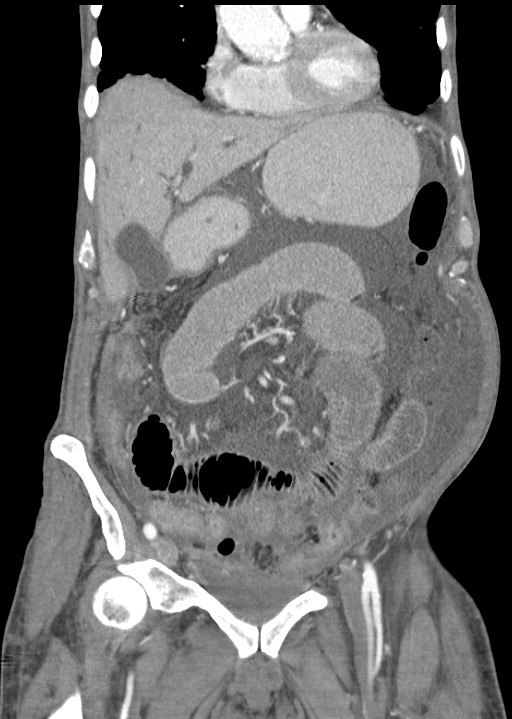
[im 52/94  soft-tissue]
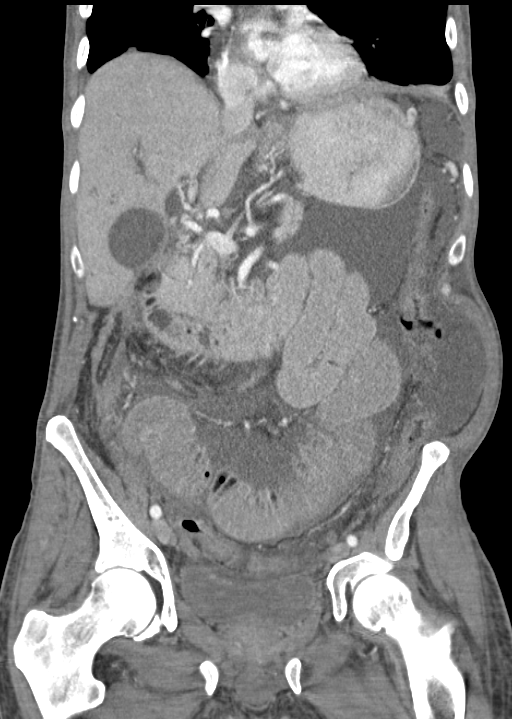

[15 of 46 positions shown; findings below may reference images not displayed]

FINDINGS: There is marked dilatation of proximal and mid small bowel with
abrupt transition to decompressed distal small bowel. The transition
point is in the right lower quadrant. This has the appearance of a
moderately high-grade small bowel obstruction. The colon is
decompressed.

There is a moderate volume peritoneal ascites, increased. The right
pigtail catheter abscess drain is unchanged in position. There is a
minute peripherally enhancing collection around the catheter,
measuring only 9 mm in depth by about 5 cm in longest dimension.
This is unchanged or slightly smaller than on 10/20/2014. There is
no change in the moderate bile duct dilatation. No focal liver
lesions are evident. There is no change in the unremarkable
appearances of the spleen, pancreas, adrenals and kidneys with the
exception of a 2 cm cyst at the posterior aspect of the left kidney.
There is no free intraperitoneal air. There is a moderately large
left effusion and a small right effusion. There is slight
atelectatic lung base opacity adjacent to the pleural fluid
collections. This is unchanged.
IMPRESSION: 1. New moderately high-grade small bowel obstruction with transition
point in the right lower quadrant.
2. Increased volume of peritoneal ascites
3. Unchanged position of the pigtail catheter with a tiny residual
collection around the catheter measuring about 0.9 x 5 cm
4. Moderately large left pleural effusion and small right pleural
effusion.

## 2015-10-08 ENCOUNTER — Other Ambulatory Visit: Payer: Self-pay | Admitting: Nurse Practitioner
